# Patient Record
Sex: Male | Born: 1975 | Race: White | Hispanic: No | Smoking: Never smoker
Health system: Southern US, Community
[De-identification: ages and names within clinical notes are randomized; demographics above are authoritative.]

## PROBLEM LIST (undated history)

## (undated) DIAGNOSIS — K219 Gastro-esophageal reflux disease without esophagitis: Secondary | ICD-10-CM

## (undated) DIAGNOSIS — G629 Polyneuropathy, unspecified: Secondary | ICD-10-CM

## (undated) DIAGNOSIS — M545 Low back pain, unspecified: Secondary | ICD-10-CM

## (undated) DIAGNOSIS — M542 Cervicalgia: Secondary | ICD-10-CM

## (undated) DIAGNOSIS — IMO0002 Reserved for concepts with insufficient information to code with codable children: Secondary | ICD-10-CM

## (undated) DIAGNOSIS — M5414 Radiculopathy, thoracic region: Secondary | ICD-10-CM

## (undated) DIAGNOSIS — G894 Chronic pain syndrome: Secondary | ICD-10-CM

## (undated) DIAGNOSIS — M25569 Pain in unspecified knee: Secondary | ICD-10-CM

## (undated) DIAGNOSIS — Z8489 Family history of other specified conditions: Secondary | ICD-10-CM

## (undated) DIAGNOSIS — R351 Nocturia: Secondary | ICD-10-CM

## (undated) DIAGNOSIS — E119 Type 2 diabetes mellitus without complications: Secondary | ICD-10-CM

## (undated) DIAGNOSIS — T8859XA Other complications of anesthesia, initial encounter: Secondary | ICD-10-CM

## (undated) DIAGNOSIS — Z87442 Personal history of urinary calculi: Secondary | ICD-10-CM

## (undated) DIAGNOSIS — M7061 Trochanteric bursitis, right hip: Secondary | ICD-10-CM

## (undated) DIAGNOSIS — I10 Essential (primary) hypertension: Secondary | ICD-10-CM

## (undated) DIAGNOSIS — M47816 Spondylosis without myelopathy or radiculopathy, lumbar region: Secondary | ICD-10-CM

## (undated) DIAGNOSIS — K4020 Bilateral inguinal hernia, without obstruction or gangrene, not specified as recurrent: Secondary | ICD-10-CM

## (undated) DIAGNOSIS — M5410 Radiculopathy, site unspecified: Secondary | ICD-10-CM

## (undated) DIAGNOSIS — M961 Postlaminectomy syndrome, not elsewhere classified: Secondary | ICD-10-CM

## (undated) DIAGNOSIS — M5430 Sciatica, unspecified side: Secondary | ICD-10-CM

## (undated) DIAGNOSIS — M1710 Unilateral primary osteoarthritis, unspecified knee: Secondary | ICD-10-CM

## (undated) DIAGNOSIS — Z973 Presence of spectacles and contact lenses: Secondary | ICD-10-CM

## (undated) HISTORY — PX: ANTERIOR CERVICAL DECOMP/DISCECTOMY FUSION: SHX1161

## (undated) HISTORY — DX: Reserved for concepts with insufficient information to code with codable children: IMO0002

## (undated) HISTORY — DX: Chronic pain syndrome: G89.4

## (undated) HISTORY — DX: Unilateral primary osteoarthritis, unspecified knee: M17.10

## (undated) HISTORY — DX: Sciatica, unspecified side: M54.30

## (undated) HISTORY — DX: Low back pain, unspecified: M54.50

## (undated) HISTORY — DX: Low back pain: M54.5

## (undated) HISTORY — DX: Pain in unspecified knee: M25.569

## (undated) HISTORY — DX: Radiculopathy, thoracic region: M54.14

## (undated) HISTORY — PX: COLONOSCOPY: SHX174

## (undated) HISTORY — DX: Postlaminectomy syndrome, not elsewhere classified: M96.1

---

## 2000-05-04 ENCOUNTER — Emergency Department (HOSPITAL_COMMUNITY): Admission: EM | Admit: 2000-05-04 | Discharge: 2000-05-04 | Payer: Self-pay | Admitting: Emergency Medicine

## 2000-05-25 ENCOUNTER — Emergency Department (HOSPITAL_COMMUNITY): Admission: EM | Admit: 2000-05-25 | Discharge: 2000-05-25 | Payer: Self-pay | Admitting: Emergency Medicine

## 2000-05-26 ENCOUNTER — Encounter: Payer: Self-pay | Admitting: Emergency Medicine

## 2002-01-04 ENCOUNTER — Emergency Department (HOSPITAL_COMMUNITY): Admission: EM | Admit: 2002-01-04 | Discharge: 2002-01-04 | Payer: Self-pay | Admitting: Emergency Medicine

## 2002-01-04 ENCOUNTER — Encounter: Payer: Self-pay | Admitting: Emergency Medicine

## 2003-06-04 ENCOUNTER — Emergency Department (HOSPITAL_COMMUNITY): Admission: EM | Admit: 2003-06-04 | Discharge: 2003-06-04 | Payer: Self-pay | Admitting: Emergency Medicine

## 2004-01-16 ENCOUNTER — Emergency Department (HOSPITAL_COMMUNITY): Admission: AD | Admit: 2004-01-16 | Discharge: 2004-01-16 | Payer: Self-pay | Admitting: Family Medicine

## 2005-03-30 ENCOUNTER — Emergency Department (HOSPITAL_COMMUNITY): Admission: EM | Admit: 2005-03-30 | Discharge: 2005-03-30 | Payer: Self-pay | Admitting: Emergency Medicine

## 2007-10-21 ENCOUNTER — Emergency Department (HOSPITAL_COMMUNITY): Admission: EM | Admit: 2007-10-21 | Discharge: 2007-10-21 | Payer: Self-pay | Admitting: Emergency Medicine

## 2008-02-17 ENCOUNTER — Inpatient Hospital Stay (HOSPITAL_COMMUNITY): Admission: RE | Admit: 2008-02-17 | Discharge: 2008-02-19 | Payer: Self-pay | Admitting: Neurological Surgery

## 2008-02-27 ENCOUNTER — Encounter: Admission: RE | Admit: 2008-02-27 | Discharge: 2008-02-27 | Payer: Self-pay | Admitting: Neurological Surgery

## 2008-04-16 ENCOUNTER — Encounter: Admission: RE | Admit: 2008-04-16 | Discharge: 2008-04-16 | Payer: Self-pay | Admitting: Neurological Surgery

## 2008-12-25 ENCOUNTER — Encounter: Admission: RE | Admit: 2008-12-25 | Discharge: 2008-12-25 | Payer: Self-pay | Admitting: Neurological Surgery

## 2010-01-09 ENCOUNTER — Encounter
Admission: RE | Admit: 2010-01-09 | Discharge: 2010-02-28 | Payer: Self-pay | Source: Home / Self Care | Admitting: Physical Medicine & Rehabilitation

## 2010-01-13 ENCOUNTER — Ambulatory Visit: Payer: Self-pay | Admitting: Physical Medicine & Rehabilitation

## 2010-02-28 ENCOUNTER — Ambulatory Visit: Payer: Self-pay | Admitting: Physical Medicine & Rehabilitation

## 2010-03-19 ENCOUNTER — Encounter
Admission: RE | Admit: 2010-03-19 | Discharge: 2010-06-17 | Payer: Self-pay | Source: Home / Self Care | Admitting: Physical Medicine & Rehabilitation

## 2010-03-26 ENCOUNTER — Ambulatory Visit: Payer: Self-pay | Admitting: Physical Medicine & Rehabilitation

## 2010-04-21 ENCOUNTER — Ambulatory Visit: Payer: Self-pay | Admitting: Physical Medicine & Rehabilitation

## 2010-05-28 ENCOUNTER — Ambulatory Visit: Payer: Self-pay | Admitting: Physical Medicine & Rehabilitation

## 2010-06-18 ENCOUNTER — Encounter
Admission: RE | Admit: 2010-06-18 | Discharge: 2010-09-16 | Payer: Self-pay | Admitting: Physical Medicine & Rehabilitation

## 2010-06-25 ENCOUNTER — Ambulatory Visit: Payer: Self-pay | Admitting: Physical Medicine & Rehabilitation

## 2010-07-25 ENCOUNTER — Ambulatory Visit: Payer: Self-pay | Admitting: Physical Medicine & Rehabilitation

## 2010-08-27 ENCOUNTER — Ambulatory Visit: Payer: Self-pay | Admitting: Physical Medicine & Rehabilitation

## 2010-09-24 ENCOUNTER — Encounter
Admission: RE | Admit: 2010-09-24 | Discharge: 2010-10-29 | Payer: Self-pay | Source: Home / Self Care | Attending: Physical Medicine & Rehabilitation | Admitting: Physical Medicine & Rehabilitation

## 2010-10-01 ENCOUNTER — Ambulatory Visit: Payer: Self-pay | Admitting: Physical Medicine & Rehabilitation

## 2010-11-03 ENCOUNTER — Encounter
Admission: RE | Admit: 2010-11-03 | Discharge: 2010-11-04 | Payer: Self-pay | Source: Home / Self Care | Attending: Physical Medicine & Rehabilitation | Admitting: Physical Medicine & Rehabilitation

## 2010-11-04 ENCOUNTER — Ambulatory Visit
Admission: RE | Admit: 2010-11-04 | Discharge: 2010-11-04 | Payer: Self-pay | Source: Home / Self Care | Attending: Physical Medicine & Rehabilitation | Admitting: Physical Medicine & Rehabilitation

## 2010-11-23 ENCOUNTER — Encounter: Payer: Self-pay | Admitting: Neurological Surgery

## 2010-12-03 ENCOUNTER — Ambulatory Visit: Admit: 2010-12-03 | Payer: Self-pay | Admitting: Physical Medicine & Rehabilitation

## 2010-12-03 ENCOUNTER — Ambulatory Visit: Payer: BC Managed Care – PPO | Attending: Physical Medicine & Rehabilitation

## 2010-12-03 ENCOUNTER — Ambulatory Visit (HOSPITAL_BASED_OUTPATIENT_CLINIC_OR_DEPARTMENT_OTHER): Payer: BC Managed Care – PPO

## 2010-12-03 ENCOUNTER — Ambulatory Visit: Payer: Self-pay

## 2010-12-03 DIAGNOSIS — M961 Postlaminectomy syndrome, not elsewhere classified: Secondary | ICD-10-CM

## 2010-12-03 DIAGNOSIS — R209 Unspecified disturbances of skin sensation: Secondary | ICD-10-CM | POA: Insufficient documentation

## 2010-12-03 DIAGNOSIS — F3289 Other specified depressive episodes: Secondary | ICD-10-CM | POA: Insufficient documentation

## 2010-12-03 DIAGNOSIS — Z79899 Other long term (current) drug therapy: Secondary | ICD-10-CM | POA: Insufficient documentation

## 2010-12-03 DIAGNOSIS — M545 Low back pain: Secondary | ICD-10-CM

## 2010-12-03 DIAGNOSIS — IMO0002 Reserved for concepts with insufficient information to code with codable children: Secondary | ICD-10-CM

## 2010-12-03 DIAGNOSIS — F411 Generalized anxiety disorder: Secondary | ICD-10-CM | POA: Insufficient documentation

## 2010-12-03 DIAGNOSIS — F329 Major depressive disorder, single episode, unspecified: Secondary | ICD-10-CM | POA: Insufficient documentation

## 2010-12-03 DIAGNOSIS — G8928 Other chronic postprocedural pain: Secondary | ICD-10-CM | POA: Insufficient documentation

## 2010-12-24 ENCOUNTER — Ambulatory Visit (HOSPITAL_BASED_OUTPATIENT_CLINIC_OR_DEPARTMENT_OTHER): Payer: BC Managed Care – PPO

## 2010-12-24 ENCOUNTER — Encounter: Payer: BC Managed Care – PPO | Attending: Physical Medicine & Rehabilitation

## 2010-12-24 DIAGNOSIS — IMO0002 Reserved for concepts with insufficient information to code with codable children: Secondary | ICD-10-CM

## 2010-12-24 DIAGNOSIS — F341 Dysthymic disorder: Secondary | ICD-10-CM | POA: Insufficient documentation

## 2010-12-24 DIAGNOSIS — M961 Postlaminectomy syndrome, not elsewhere classified: Secondary | ICD-10-CM | POA: Insufficient documentation

## 2010-12-24 DIAGNOSIS — G8928 Other chronic postprocedural pain: Secondary | ICD-10-CM | POA: Insufficient documentation

## 2010-12-24 DIAGNOSIS — M545 Low back pain: Secondary | ICD-10-CM

## 2011-02-02 ENCOUNTER — Encounter: Payer: BC Managed Care – PPO | Attending: Physical Medicine & Rehabilitation

## 2011-02-02 ENCOUNTER — Ambulatory Visit (HOSPITAL_BASED_OUTPATIENT_CLINIC_OR_DEPARTMENT_OTHER): Payer: BC Managed Care – PPO | Admitting: Physical Medicine & Rehabilitation

## 2011-02-02 DIAGNOSIS — M5430 Sciatica, unspecified side: Secondary | ICD-10-CM

## 2011-02-02 DIAGNOSIS — M549 Dorsalgia, unspecified: Secondary | ICD-10-CM

## 2011-02-02 DIAGNOSIS — M961 Postlaminectomy syndrome, not elsewhere classified: Secondary | ICD-10-CM | POA: Insufficient documentation

## 2011-02-02 DIAGNOSIS — F341 Dysthymic disorder: Secondary | ICD-10-CM | POA: Insufficient documentation

## 2011-02-02 DIAGNOSIS — G8928 Other chronic postprocedural pain: Secondary | ICD-10-CM | POA: Insufficient documentation

## 2011-03-02 NOTE — Assessment & Plan Note (Signed)
HISTORY:  This is the patient that has been followed by Dr. Wynn Banker for some time for complaints of multiple pain locations including his back, shoulders, his buttocks, his low back and radiating down into his legs.  Today, his Oswestry score is 34.  The patient states his condition is unchanged.  His pain level stays around 6 or 7.  He is able to carry on his own activities of daily living.  He works 40 hours plus at [employer].  He does drive and he is able to go down steps.  He can walk for quite a distance without any real problems.  He does have some paresthesias and anxiety when the pain is increased.  Constitutionally, he is within normal limits.  Physical exam show his blood pressure to be 131/75, his pulse 94, respirations 18 and his O2 sats 97 on room air.  He can extend to about 5 degrees, flex to about 45 mL degrees without pain.  He is alert and oriented x3.  His affect is normal.  He is constitutionally again within normal limits.  Strength in lower extremities is 5/5 including iliopsoas, quadriceps, dorsiflexors and plantar flexors.  His sensation is intact to pinprick throughout the upper and lower extremities.  We will go ahead and refill his medicines today.  He will follow up with me in a month.  Prescriptions given for Opana ER 10 mg one p.o. b.i.d. 60 with no refill and oxycodone 5/325 one p.o. b.i.d. p.r.n. 60 with no refill.  His questions were encouraged and answered.  We will see him back in one month's time.     Philomena Buttermore L. Blima Dessert    RLW/MedQ D:  02/02/2011 15:02:18  T:  02/03/2011 07:07:10  Job #:  [id number]

## 2011-03-03 ENCOUNTER — Encounter: Payer: BC Managed Care – PPO | Attending: Neurosurgery | Admitting: Neurosurgery

## 2011-03-03 DIAGNOSIS — M545 Low back pain, unspecified: Secondary | ICD-10-CM | POA: Insufficient documentation

## 2011-03-03 DIAGNOSIS — M79609 Pain in unspecified limb: Secondary | ICD-10-CM | POA: Insufficient documentation

## 2011-03-04 NOTE — Assessment & Plan Note (Signed)
David [name] has followed here for low back pain with left radicular pain with some arm pain.  I saw him last month.  He was pretty much unchanged today.  He tells me he has no new complaints, but just multiple complaints at location throughout his back, shoulders, buttocks and back radiating down.  Today, his Oswestry score is 34 as it was last month.  He states his condition has unchanged.  His average pain is about 6 or 7.  The activity level is about 4-7.  Sleep is fair.  Pain is worse all the time.  All activities are aggravated at rest, heat, therapy, pacing, medication tend to help, mobility and walk 30-60 minutes without any difficulty.  He drives and climb steps.  He works 40 hours plus a week.  Review of systems shows is notable for high and low blood sugar sometimes.  He has spasms, depression, anxiety and tingling, but his extremities otherwise unchanged.  Social history, medical history and family history are all unchanged. He is married.  He lives with his wife and son.  Physical exam shows a blood pressure to be 145/93, pulse 87, respirations 18 and O2 sats 97% on room air.  There are no signs of aberrant behavior.  Motor strength is still 5/5 in the lower extremities including iliopsoas, quadriceps, dorsiflexors and plantar flexors. Sensation is intact.  Gait is normal.  Affect is bright and alert, orientation x3.  He is constitutionally within normal limits.  He did ask about a note to get him out of jury duties.  Says Dr. Wynn Banker has declined before.  I asked Dr. Wynn Banker about it and why he declined him again.  We did give his refill of his oxycodone 5/325 one p.o. b.i.d. 60 with no refill and his Opana ER 10 mg one p.o. b.i.d. 60 with no refills.  Questions were encouraged and answered.  We will see him back in a month.     David [name]    RLW/MedQ D:  03/03/2011 14:35:35  T:  03/04/2011 03:47:39  Job #:  [id number]

## 2011-03-17 NOTE — Op Note (Signed)
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   PROCEDURES:  1. Decompressive anterior cervical diskectomy C4-C5, C6-C7, and C7-T1.  2. Anterior cervical arthrodesis C4-C5, C6-C7, and C7-T1 utilizing 8-      mm corticocancellous allografts.  3. Anterior cervical plating C4-C5 utilizing a 25-mm [employer]ure      plate.  4. Anterior cervical plating C6 to T1 inclusive utilizing a 47-mm      Atlantis Venture plate.   ASSISTANT:  Donalee Citrin, M.D.   ANESTHESIA:  General endotracheal.   COMPLICATIONS:  None apparent.   INDICATIONS FOR PROCEDURE:  Mr. Cuevas is a 35 year old gentleman who  presented with severe left arm pain.  He had an MRI which showed severe  spondylosis with severe neural foraminal stenosis at C4-C5, C6-C7, C7-  T1, and T1-T2.  He tried medical management for quite some time without  significant relief.  I recommended an anterior approach to C4-C5, C6-C7,  and C7-T1.  If this did not cure all of his left arm pain, we would have  to do T1-T2 from a posterior approach, but I felt this offered him an  excellent opportunity to improve his pain syndrome.  He understood the  risks, benefits, and expected outcome and wished to proceed.   DESCRIPTION OF THE PROCEDURE:  The patient was taken to the operating  room, and after induction of adequate general endotracheal anesthesia,  he was placed in the supine position.  His  left anterior cervical region  was prepped with DuraPrep and then draped in the usual sterile fashion.  We marked out for 2 incisions, one for the C4-C5 disk and one for the C6-  C7 and C7-T1 anterior cervical diskectomies.  We started at the lower  incision first and made a transverse incision to the left of midline  just over the clavicle, caved this down through the platysma which was  elevated, opened, and undermined with Metzenbaum scissors.  I then  dissected a plane medial to the sternocleidomastoid muscle and internal  carotid artery and lateral to the trachea and esophagus to expose C6-C7  at C7-T1.  Intraoperative fluoroscopy confirmed my level and then I took  the longus colli muscles to expose C6-C7 and C7-T1.  We placed Shadow-  Line retractors under this.  We incised the disk space anteriorly,  performed the initial diskectomy with pituitary rongeurs and curved  Karlin curettes.  We then used the high-speed drill to drill the  endplates to prepare for later arthrodesis.  We drilled down to the  level of the posterior longitudinal ligament which was opened with a  nerve hook, first the C7-T1 bodies.  We marched along the superior  endplate of T1 until we found the pedicle bilaterally and then marched  along the C8 nerve root to decompress it.  There was a large spur disk  complex to the left come off the inferior endplate of C7.  This was  removed by drilling more of the vertebral body over the foramen on the  left side and then undercutting this.  The C8 nerve was identified and  well decompressed distally into the foramen until we were distal to the  pedicle.  We then performed the exact same decompression at C6-C7 after  palpating with a black nerve hook into the foramen and into the midline  C7-T1 to assure adequate decompression.  We drilled the endplates of C6-  C7, drilled down to the level of the posterior longitudinal ligament.  We opened this with a nerve hook and  then removed it undercutting the  bodies of C6 and C7.  We marched along the C7 endplate to identify the  pedicles and marched along the pedicles to identify the C7 nerve roots,  decompressed these distally into the foramen until we were distal to the  pedicle.  We then palpated with a nerve hook to assure adequate  decompression of surgical canal and of the foramina.  We then measured  the interspaces to be 8 mm.  We used 8-mm corticocancellous allograft  and tapped these into position at C6-C7 at C7-T1.  We then used 47-mm  Venture plate to place two 13-mm variable angle screws in the bodies of  C6, C7, and T1, and these were locked in the plate by the locking  mechanism within the plate.  We then dried all bleeding points and the  internal carotid artery and lateral to the trachea and esophagus to  expose C4-5 and intraoperative fluoroscopy confirmed the level at C4-5  and then the longus colli muscles were taken down.  The Shadow-Line  retractors were placed under this.  The annulus was incised at C4-C5 and  the initial diskectomy was done with pituitary rongeurs and curved  curettes.  We then used the high-speed drill to drill the endplates to  prepare for later arthrodesis.  We drilled down to the level of the  posterior longitudinal ligament which was opened and removed in a  circumferential fashion with undercutting bodies of C4-C5.  There was a  large osteophyte coming off the inferior endplate of C4 which was  removed by undercutting the vertebral body as best we could.  Bilateral  foraminotomies were performed.  The C5 nerves were divided and well  decompressed distally into the foramina.  We then palpated with a nerve  hook to assure adequate decompression and measured the interspace to be  8 mm.  We used another 8-mm corticocancellous allograft and tapped this  into position at C4-C5.  We then used a 25-mm Atl[employer] Venture plate and  place two 13-mm variable angle screws in  the bodies of C4 and C5 and  then these were locked in the plate by locking mechanism within the  plate.  We then irrigated with saline solution containing bacitracin,  dried all bleeding points at both incisions, placed a 7 flat JP drain  through a separate stab incision, and then closed each incision with 3-0  Vicryl in the platysma and 3-0 Vicryl in the subcuticular tissues.  The  incisions  were then closed with Benzoin and Steri-Strips.  A sterile  dressing was applied.  The patient was awakened from general anesthesia  and transferred to recovery room in stable condition.  At the end of the  procedure, all sponge, needle, and instrument counts were correct.      Tia Alert, MD  Electronically Signed     DSJ/MEDQ  D:  02/15/2008  T:  02/16/2008  Job:  [id number]

## 2011-03-17 NOTE — Discharge Summary (Signed)
NAME:  David [name], David [name] NO.:  [id number]   MEDICAL RECORD NO.:  [id number]          PATIENT TYPE:  INP   LOCATION:  3037                         FACILITY:  [facility]   PHYSICIAN:  Tia Alert, MD     DATE OF BIRTH:  [birth year]   DATE OF ADMISSION:  02/15/2008  DATE OF DISCHARGE:  02/19/2008                               DISCHARGE SUMMARY   ADMITTING DIAGNOSES:  Cervical spondylosis versus cervical spinal  stenosis and cervical disk herniation C4-C5, C6-C7, and C7-T1 with neck  and left arm pain.   PROCEDURE:  Anterior cervical diskectomy fusion plating at C4-C5, C6-C7,  and C7-T1.   SECONDARY DIAGNOSIS:  Urinary retention.   BRIEF HISTORY OF PRESENT ILLNESS:  [name] is a 35 year old gentleman  who presented with severe neck pain with left arm pain with numbness and  tingling in the left arm.  He had an MRI, which showed severe cervical  spondylosis with cervical disk herniations at C4-C5 to the left, C6-C7,  and C7-T1 to the left and T1-T2 on the left.  We recommended surgical  decompression after failed medical management, we recommended anterior  cervical discectomy fusion with plating at C4-C5 and then C6-C7, and C7-  T1 through separate stab incisions.  We did not feel that we could  approach the T1-T2 level from an anterior approach without opening the  sternum of the manubrium and the patient did not wish to do this.  Therefore, we chose to pursue only the upper 3 levels.  The patient  understood the risks, benefits, unexpected outcome and wished to proceed  with this procedure.   HOSPITAL COURSE:  The patient was admitted on February 15, 2008, and taken  to the operating room where he underwent anterior cervical discectomy  fusion with plating at C4-C5, C6-C7, and C7-T1.  The patient tolerated  the procedure well and he was taken to recovery room and then to the  floor in stable condition.  For details of the operative procedure,  please see the dictated  operative note.  The patient's hospital course  was prolonged by postoperative urinary retention requiring in and out  catheterization followed by Foley catheter placement for about 36 hours.  His pain was well controlled on oral pain medications, though he did  complain of appropriate neck soreness.  His radicular pain was gone.  He  denied any numbness, tingling, or weakness in his arms or hands, or any  change in his gait.  He was ambulating in the halls without difficulty.  On February 18, 2008, his Foley catheter was discontinued at 6 a.m., and he  voided multiple times through the day with 0 mL postvoid residuals.  Therefore, he was discharged to home on February 19, 2008, with plans of  following with me in 2 weeks in the office.   DISCHARGE MEDICATIONS:  1. Dilaudid 2 mg p.o. q.6 h p.r.n. pain, 60 pills and no refills.  2. Valium 5 mg p.o. t.i.d. p.r.n. spasms, 60 pills and no refill.   He understands the use of these medications, the potential side  effects.  All of his discharge questions were answered to his satisfaction.  He  demonstrated understanding of all discharge instructions.  He is to call  for any unusual redness, tenderness, swelling over his wound or any  temperature above 101.5.  His followup appointment is in 2 weeks with  Dr. Yetta Barre.   FINAL DIAGNOSES:  1. Anterior cervical discectomy with fusion plating.  2. Postoperative urinary retention.      Tia Alert, MD  Electronically Signed     DSJ/MEDQ  D:  02/19/2008  T:  02/20/2008  Job:  [id number]

## 2011-04-01 ENCOUNTER — Encounter (HOSPITAL_BASED_OUTPATIENT_CLINIC_OR_DEPARTMENT_OTHER): Payer: BC Managed Care – PPO | Admitting: Neurosurgery

## 2011-04-01 DIAGNOSIS — M1710 Unilateral primary osteoarthritis, unspecified knee: Secondary | ICD-10-CM

## 2011-04-01 DIAGNOSIS — M5430 Sciatica, unspecified side: Secondary | ICD-10-CM

## 2011-04-02 NOTE — Assessment & Plan Note (Signed)
[name] follows up here today for his low back pain, radicular pain with some left upper extremity pain as well as back pain.  He states this is unchanged.  He rates it about 5.  It is sharp burning pain and is intermittent for the most part but he has constant pain that fluctuates.  His activity level is rated about 4-7, pain is same 24 hours a day. Sleep patterns are fair.  All activities aggravate his pain.  Rest, heat, pacing activities and medication tend to help.  Functionality, he works 40 hours a week at [employer].  REVIEW OF SYSTEMS:  Notable for those difficulties stated above, otherwise within normal limits.  The patient can walk for about 60 minutes, climb stairs and walks without any difficulties.  Gait appears to be within normal limits.  PAST MEDICAL HISTORY:  Unchanged.  SOCIAL HISTORY:  Married, lives with his wife and child.  FAMILY HISTORY:  Unchanged.  PHYSICAL EXAMINATION:  VITAL SIGNS:  His blood pressure is 147/84, pulse 85, respirations 20, O2 sats 99 on room air. NEURO:  His motor strength is good.  Sensation is good in the lower extremities.  He is alert and oriented x3.  His affect is bright. Constitutionally within normal limits.  ASSESSMENT: 1. Chronic pain syndrome. 2. Low back pain with radiculopathy 3. Bilateral knee pain.  PLAN: 1. The patient states due to finances, he may have to change pain     clinics and I am going to schedule him with the nurse next month to     try to help with that some. 2. I will go ahead and refill his oxycodone 5/325 one p.o. b.i.d. 60     with no refill. 3. Opana ER 10 mg 1 p.o. b.i.d. 60 with no refill. 4. Hopefully he will be able to follow up with [place] in 1 month.  If he     does decide to change clinics, he will notify us.     Victorhugo Preis L. Blima Dessert Electronically Signed    RLW/MedQ D:  04/01/2011 14:18:31  T:  04/02/2011 04:21:48  Job #:  [id number]

## 2011-04-29 ENCOUNTER — Encounter: Payer: BC Managed Care – PPO | Attending: Neurosurgery | Admitting: Neurosurgery

## 2011-04-29 DIAGNOSIS — M545 Low back pain, unspecified: Secondary | ICD-10-CM | POA: Insufficient documentation

## 2011-04-29 DIAGNOSIS — M5137 Other intervertebral disc degeneration, lumbosacral region: Secondary | ICD-10-CM

## 2011-04-29 DIAGNOSIS — M79609 Pain in unspecified limb: Secondary | ICD-10-CM | POA: Insufficient documentation

## 2011-04-29 DIAGNOSIS — M5430 Sciatica, unspecified side: Secondary | ICD-10-CM

## 2011-04-29 DIAGNOSIS — M25569 Pain in unspecified knee: Secondary | ICD-10-CM

## 2011-04-29 DIAGNOSIS — G894 Chronic pain syndrome: Secondary | ICD-10-CM

## 2011-04-30 NOTE — Assessment & Plan Note (Signed)
Mr. Koury follows up today for his low back pain, radicular pain, with some occasional left upper extremity problems.  He states his pain is unchanged.  He rates it currently at 6-8, it is intermittent with paresthesia-like problems as well as some sharp burning pain.  His activity level is about 6 or 7 pain same 24 hours a day, sleep patterns are fair.  All activities aggravate his pain.  Heat, pacing activities, rest, and medications tend to help.  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  Review of systems is notable for those difficulties described above as well as some depression, anxiety.  No suicidal thoughts.  No aberrant behaviors.  Past medical history, social history and family history all unchanged.  Physical exam; his blood pressure is 126/71, pulse 96, respirations 10, O2 sats 98 on room air.  His motor strength is 5/5.  Sensation is intact.  Constitutionally, he is within normal limits.  He is alert and oriented x3.  He appears somewhat depressed but his gait is normal.  ASSESSMENT: 1. Chronic pain syndrome. 2. Low back pain with radiculopathy. 3. Bilateral knee pain, it is intermittent.  PLAN: 1. I will go ahead and refill his oxycodone 5/325 one p.o. b.i.d. 60     with no refill. 2. Opana ER 10 mg 1 p.o. b.i.d. 60 with no refill. 3. He will return to the clinic in 1 month.  His questions were     encouraged and answered.     Shadd Dunstan L. Blima Dessert Electronically Signed    RLW/MedQ D:  04/29/2011 14:07:08  T:  04/30/2011 03:31:47  Job #:  [id number]

## 2011-06-03 ENCOUNTER — Encounter: Payer: BC Managed Care – PPO | Attending: Neurosurgery | Admitting: Neurosurgery

## 2011-06-03 DIAGNOSIS — G894 Chronic pain syndrome: Secondary | ICD-10-CM

## 2011-06-03 DIAGNOSIS — M545 Low back pain, unspecified: Secondary | ICD-10-CM | POA: Insufficient documentation

## 2011-06-03 DIAGNOSIS — M1710 Unilateral primary osteoarthritis, unspecified knee: Secondary | ICD-10-CM

## 2011-06-03 DIAGNOSIS — M79609 Pain in unspecified limb: Secondary | ICD-10-CM | POA: Insufficient documentation

## 2011-06-04 NOTE — Assessment & Plan Note (Signed)
HISTORY OF PRESENT ILLNESS:  David [name] follows up today for his continued upper and lower extremity pain as well as back pain.  He reports no change in his radicular symptoms and rates his pain about 5- 7, it is intermittent dull stabbing and sharp pain with constant tingling.  General activity level is 5-8.  Pain is same 24 hours a day. Sleep patterns are fair.  All activities aggravate.  Rest, heat, medication, and pacing tends to help.  He walks without assistance.  He can walk up to an hour.  He climb steps and drives.  He works 40 hours plus a week.  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  REVIEW OF SYSTEMS:  Notable for those difficulties described above as well as some spasms and depression, anxiety.  No suicidal thoughts or aberrant behaviors.  His Oswestry score is 40.  PAST MEDICAL HISTORY:  Unchanged.  SOCIAL HISTORY:  He is married, lives with his wife and son.  FAMILY HISTORY:  Unchanged.  PHYSICAL EXAMINATION:  VITAL SIGNS:  His blood pressure is 111/66, pulse 96, respirations 16, and O2 sats 98 on room air. NEUROLOGIC:  His motor strength is 5/5 in the upper and lower extremities.  His sensation is intact.  Constitutionally, he is within normal limits.  His affect is bright and alert.  He is oriented x3.  He has got a normal gait.  ASSESSMENT: 1. Chronic pain syndrome. 2. Low back pain with radiculopathy. 3. History of knee pain. 4. History of cervical laminectomy with fusion pain syndrome.  PLAN: 1. Refill Opana ER 10 mg 1 p.o. b.i.d. #60 with no refill. 2. Oxycodone 5/325 one p.o. b.i.d. #60 with no refill. 3. He will follow up in my clinic in 1 month.  His questions were     encouraged and answered.     Rosemary Mossbarger L. Blima Dessert Electronically Signed    RLW/MedQ D:  06/03/2011 14:44:03  T:  06/04/2011 00:27:29  Job #:  [id number]

## 2011-07-03 ENCOUNTER — Encounter: Payer: BC Managed Care – PPO | Attending: Neurosurgery | Admitting: Neurosurgery

## 2011-07-03 DIAGNOSIS — M25569 Pain in unspecified knee: Secondary | ICD-10-CM | POA: Insufficient documentation

## 2011-07-03 DIAGNOSIS — G894 Chronic pain syndrome: Secondary | ICD-10-CM | POA: Insufficient documentation

## 2011-07-03 DIAGNOSIS — IMO0002 Reserved for concepts with insufficient information to code with codable children: Secondary | ICD-10-CM | POA: Insufficient documentation

## 2011-07-03 DIAGNOSIS — M545 Low back pain, unspecified: Secondary | ICD-10-CM | POA: Insufficient documentation

## 2011-07-03 DIAGNOSIS — M961 Postlaminectomy syndrome, not elsewhere classified: Secondary | ICD-10-CM

## 2011-07-03 DIAGNOSIS — M5430 Sciatica, unspecified side: Secondary | ICD-10-CM

## 2011-07-03 NOTE — Assessment & Plan Note (Signed)
HISTORY OF PRESENT ILLNESS:  This is a patient who is seen by Dr. Wynn Banker for back and neck pain that radiates down to his legs.  He reports no change in his pain but he does state that he feels like his back pain has been worse but he has been working at least 60 hours a week for last 2 months and he has taken a short sabbatical for few days to try to rest.  He blames his pain elevation on that.  He has average pain that is 6-8.  It is a sharp, burning, stabbing, tingling, aching pain that is constant.  Pain is same 24 hours a day.  His activity level is 7-9.  All activities aggravate.  Rest, heat, therapy, and medication tend to help.  He drives.  He will climb steps.  He can walk about 60 minutes at a time.  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  REVIEW OF SYSTEMS:  Notable for those difficulties as well as some numbness, depression, anxiety.  No suicidal thoughts or aberrant behaviors.  His Oswestry score is 44.  PAST MEDICAL HISTORY:  Unchanged.  SOCIAL HISTORY:  He is married, lives with his wife.  FAMILY HISTORY:  Unchanged.  PHYSICAL EXAMINATION:  VITAL SIGNS:  His blood pressure 126/64, pulse 97, respirations 18, and O2 sats 96 on room air. NEUROLOGIC:  His motor strength to confrontational testing is 5/5 in the upper and lower extremities.  Sensation is intact.  Constitutionally, he is mildly obese.  He is alert and oriented x3.  His affect is bright. His gait is normal.  ASSESSMENT: 1. Chronic pain syndrome postop. 2. Low back pain with radiculopathy. 3. History of knee pain. 4. History of cervical laminectomy with post-laminectomy syndrome.  PLAN:  Refill Opana ER 10 mg 1 p.o. b.i.d. 60 with no refill and oxycodone 5/325 one p.o. b.i.d. 60 with no refill.  He will follow up in the clinic in 1 month.  His questions were encouraged and answered.     David [name] L. Blima Dessert Electronically Signed    RLW/MedQ D:  07/03/2011  14:35:25  T:  07/03/2011 21:56:27  Job #:  [id number]

## 2011-07-28 LAB — BASIC METABOLIC PANEL
CO2: 25
Chloride: 96
GFR calc Af Amer: >60
Potassium: 3.2 — ABNORMAL LOW
Sodium: 132 — ABNORMAL LOW

## 2011-07-28 LAB — CBC
Hemoglobin: 15.4
MCHC: 35.1
MCV: 94.8
RBC: 4.63

## 2011-07-28 LAB — URINALYSIS, ROUTINE W REFLEX MICROSCOPIC
Bilirubin Urine: NEGATIVE
Hgb urine dipstick: NEGATIVE
Ketones, ur: 15 — AB
Protein, ur: NEGATIVE
Urobilinogen, UA: 0.2

## 2011-07-28 LAB — APTT: aPTT: 32

## 2011-07-28 LAB — DIFFERENTIAL
Basophils Relative: 0
Eosinophils Absolute: 0.2
Monocytes Absolute: 0.4
Monocytes Relative: 5
Neutro Abs: 4.7

## 2011-07-28 LAB — URINE CULTURE
Colony Count: NO GROWTH
Culture: NO GROWTH

## 2011-07-30 ENCOUNTER — Encounter: Payer: BC Managed Care – PPO | Admitting: Neurosurgery

## 2011-07-31 ENCOUNTER — Ambulatory Visit: Payer: BC Managed Care – PPO | Admitting: Neurosurgery

## 2011-08-07 LAB — CBC
HCT: 47.3
Hemoglobin: 16.5
MCHC: 34.8
MCV: 95.3
RBC: 4.97

## 2011-08-07 LAB — INFLUENZA A AND B ANTIGEN (CONVERTED LAB)

## 2011-08-07 LAB — DIFFERENTIAL
Basophils Relative: 1
Eosinophils Absolute: 0.2
Eosinophils Relative: 1
Monocytes Absolute: 0.8
Monocytes Relative: 5

## 2011-08-27 ENCOUNTER — Encounter: Payer: BC Managed Care – PPO | Attending: Neurosurgery | Admitting: Neurosurgery

## 2011-08-27 DIAGNOSIS — R209 Unspecified disturbances of skin sensation: Secondary | ICD-10-CM | POA: Insufficient documentation

## 2011-08-27 DIAGNOSIS — G894 Chronic pain syndrome: Secondary | ICD-10-CM | POA: Insufficient documentation

## 2011-08-27 DIAGNOSIS — M961 Postlaminectomy syndrome, not elsewhere classified: Secondary | ICD-10-CM | POA: Insufficient documentation

## 2011-08-27 DIAGNOSIS — M549 Dorsalgia, unspecified: Secondary | ICD-10-CM | POA: Insufficient documentation

## 2011-08-27 DIAGNOSIS — M62838 Other muscle spasm: Secondary | ICD-10-CM | POA: Insufficient documentation

## 2011-08-27 DIAGNOSIS — M542 Cervicalgia: Secondary | ICD-10-CM | POA: Insufficient documentation

## 2011-08-27 DIAGNOSIS — M5430 Sciatica, unspecified side: Secondary | ICD-10-CM

## 2011-08-27 DIAGNOSIS — M545 Low back pain: Secondary | ICD-10-CM

## 2011-08-27 DIAGNOSIS — IMO0002 Reserved for concepts with insufficient information to code with codable children: Secondary | ICD-10-CM | POA: Insufficient documentation

## 2011-08-27 NOTE — Assessment & Plan Note (Signed)
This is a patient of Dr. Wynn Banker, seen for back and neck pain.  He has got a postlaminectomy syndrome as well as a history of cervical laminectomy.  Rates his average pain a 5 to a 7.  It is a sharp, burning- to-dull stabbing pain, aching pain that is constant.  General activity level is a 6 to an 8.  Pain is same 24 hours a day.  Sleep patterns are fair.  All activities aggravate.  Rest, heat, therapy, and medication tend to help.  He walks independently, climbs steps and drives.  He can walk up to an hour at a time.  He is employed 50 hours a week plus at [employer].  REVIEW OF SYSTEMS:  Notable for difficulties described above as well as some paresthesias, spasm, depression, anxiety.  No suicidal thoughts or aberrant behaviors.  Last pill count and UDS consistent.  PAST MEDICAL HISTORY:  Unchanged.  SOCIAL HISTORY:  Unchanged.  FAMILY HISTORY:  Unchanged.  PHYSICAL EXAMINATION:  VITAL SIGNS:  His blood pressure 127/61, pulse 94, respirations 16, O2 sats 97 on room air. NEUROLOGIC:  His motor strength and sensation are intact. CONSTITUTIONAL:  He is within normal limits.  He is alert and oriented x3.  He has a normal gait.  ASSESSMENT: 1. Chronic pain syndrome postoperatively. 2. Low back pain with radiculopathy, post laminectomy syndrome. 3. Post cervical laminectomy syndrome.  PLAN: 1. Refill oxycodone 5/325 one p.o. b.i.d., 60 with no refill. 2. Opana ER 10 mg 1 p.o. b.i.d., 60 with no refills.  Questions were     encouraged and answered.  I will see him in a month.     David [name] L. Blima Dessert Electronically Signed    RLW/MedQ D:  08/27/2011 14:02:42  T:  08/27/2011 23:25:16  Job #:  [id number]

## 2011-09-23 ENCOUNTER — Ambulatory Visit: Payer: BC Managed Care – PPO | Admitting: Neurosurgery

## 2011-09-30 ENCOUNTER — Encounter: Payer: BC Managed Care – PPO | Attending: Neurosurgery | Admitting: Neurosurgery

## 2011-09-30 DIAGNOSIS — E669 Obesity, unspecified: Secondary | ICD-10-CM | POA: Insufficient documentation

## 2011-09-30 DIAGNOSIS — M545 Low back pain, unspecified: Secondary | ICD-10-CM | POA: Insufficient documentation

## 2011-09-30 DIAGNOSIS — G8928 Other chronic postprocedural pain: Secondary | ICD-10-CM

## 2011-09-30 DIAGNOSIS — M961 Postlaminectomy syndrome, not elsewhere classified: Secondary | ICD-10-CM | POA: Insufficient documentation

## 2011-09-30 DIAGNOSIS — IMO0002 Reserved for concepts with insufficient information to code with codable children: Secondary | ICD-10-CM | POA: Insufficient documentation

## 2011-09-30 NOTE — Assessment & Plan Note (Signed)
Patient of Dr. Wynn Banker seen for back and neck pain, post cervical laminectomy pain, as well as low back pain, post-surgical pain.  He rates the pain as sharp, burning, dull, stabbing, tingling, aching, averages 6-7.  General activity level is 5 to an 8.  Pain is same 24 hours a day.  Sleep patterns are fair.  All activities aggravate.  Rest, heat, therapy, and medication tend to help.  He walks without assistance.  He can climb steps and drive and can walk up to an hour at a time.  He works 50 hours plus.  REVIEW OF SYSTEMS:  Notable for difficulties described above as well as some numbness, tingling, spasms, depression, anxiety.  No suicidal thoughts or aberrant behaviors.  Last UDS and pill count is correct.  He will be getting new UDS today.  PAST MEDICAL HISTORY:  Unchanged.  SOCIAL HISTORY:  Unchanged.  FAMILY HISTORY:  Unchanged.  PHYSICAL EXAMINATION:  His blood pressure is 137/78, pulse 100, respiratory rate 16, O2 sats 99% on room air.  His motor strength and sensation intact.  Constitutionally he is mildly obese.  He is alert and oriented x3.  Has a normal gait.  ASSESSMENT: 1. Chronic pain postoperatively. 2. Low back pain with radiculopathy and postlaminectomy syndrome. 3. Post cervical laminectomy syndrome.  PLAN: 1. Refill oxycodone 5/325 one p.o. b.i.d., 60 with no refill. 2. Opana ER 10 mg 1 p.o. b.i.d., 60 with no refills.  Questions were encouraged and answered.  I will see him back here in 1 month.     Lanecia Sliva L. Blima Dessert Electronically Signed    RLW/MedQ D:  09/30/2011 14:11:50  T:  09/30/2011 23:50:15  Job #:  [id number]

## 2011-10-29 ENCOUNTER — Encounter: Payer: BC Managed Care – PPO | Attending: Neurosurgery | Admitting: Neurosurgery

## 2011-10-29 DIAGNOSIS — M542 Cervicalgia: Secondary | ICD-10-CM

## 2011-10-29 DIAGNOSIS — M961 Postlaminectomy syndrome, not elsewhere classified: Secondary | ICD-10-CM | POA: Insufficient documentation

## 2011-10-29 DIAGNOSIS — M545 Low back pain, unspecified: Secondary | ICD-10-CM | POA: Insufficient documentation

## 2011-10-29 DIAGNOSIS — G8928 Other chronic postprocedural pain: Secondary | ICD-10-CM

## 2011-10-30 NOTE — Assessment & Plan Note (Signed)
This is a patient of Dr. Wynn Banker seen for low back pain, post cervical and lumbar laminectomy syndrome.  He rates his average pain at a 5 or 6. It is a sharp, burning, dull, stabbing, tingling and aching pain. General activity level 6 to an 8.  Pain is worse during the morning and in the evening.  Sleep patterns are fair.  All activities aggravate. Rest, heat, therapy and medication helps.  He walks without assistance. He climb steps and drive.  He can walk about an hour at a time.  He works 50 hours plus.  REVIEW OF SYSTEMS:  Notable for the difficulties described above as well as some spasm, depression, anxiety and no suicidal thoughts or aberrant behaviors.  Pill count and UDS consistent.  PAST MEDICAL HISTORY, SOCIAL HISTORY AND FAMILY HISTORY:  Unchanged.  PHYSICAL EXAMINATION:  VITAL SIGNS:  His blood pressure is 135/68, pulse 93, respirations 16 and O2 sats 97 on room air.  EXTREMITIES:  His motor strength and sensation are intact. NEUROLOGIC:  Constitutionally, he is within normal limits.  He is alert and oriented x3.  Has a slight limp.  ASSESSMENT: 1. Chronic postoperative pain. 2. Chronic low back pain with post laminectomy syndrome. 3. Post cervical laminectomy syndrome.  PLAN: 1. Refill oxycodone 5/325, 1 p.o. b.i.d. p.r.n. 60 with no refill. 2. Opana ER 10 mg 1 p.o. b.i.d., 60 with no refills.  His questions     were encouraged and answered.  I will see him in a month.     Briseida Gittings L. Blima Dessert Electronically Signed    RLW/MedQ D:  10/29/2011 14:01:01  T:  10/30/2011 06:38:47  Job #:  [id number]

## 2011-12-02 ENCOUNTER — Encounter: Payer: BC Managed Care – PPO | Attending: Neurosurgery | Admitting: Neurosurgery

## 2011-12-02 DIAGNOSIS — M5430 Sciatica, unspecified side: Secondary | ICD-10-CM

## 2011-12-02 DIAGNOSIS — G8929 Other chronic pain: Secondary | ICD-10-CM | POA: Insufficient documentation

## 2011-12-02 DIAGNOSIS — M961 Postlaminectomy syndrome, not elsewhere classified: Secondary | ICD-10-CM | POA: Insufficient documentation

## 2011-12-02 DIAGNOSIS — G8928 Other chronic postprocedural pain: Secondary | ICD-10-CM | POA: Insufficient documentation

## 2011-12-02 DIAGNOSIS — M545 Low back pain, unspecified: Secondary | ICD-10-CM | POA: Insufficient documentation

## 2011-12-03 NOTE — Assessment & Plan Note (Signed)
This is a patient of Dr. Wynn Banker seen for back and chronic pain.  He reports his pain at 5-6 intermittent, sharp and burning pain.  General activity level is 5-7.  The pain is same 24 hours a day.  Sleep patterns are fair.  All activities aggravate.  Rest, heat, therapy, pacing and medication help.  He walks without assistance up to an hour at a time. He climb steps and drive.  Functionally, he works 50 hours plus a week.  REVIEW OF SYSTEMS:  Notable for difficulties described above as well as some paresthesias, depression, anxiety.  No suicidal thoughts or aberrant behaviors.  Last pill count and UDS consistent.  PAST MEDICAL HISTORY, SOCIAL HISTORY AND FAMILY HISTORY:  Unchanged.  PHYSICAL EXAMINATION:  VITAL SIGNS:  His blood pressure is 120/62, pulse 86, respirations 14 and O2 sats 98 on room air.  EXTREMITIES:  His motor strength and sensation is intact. NEUROLOGIC:  Constitutionally, he is obese.  He is alert and oriented x3.  He has a normal gait.  ASSESSMENT: 1. Chronic postoperative pain. 2. Chronic low back pain and post laminectomy syndrome 3. Post cervical laminectomy syndrome.  PLAN: 1. Refill oxycodone 5/325, 1 p.o. b.i.d., 60 with no refills 2. Opana ER 10 mg 1 p.o. b.i.d., 60 with no refills.  His questions     were encouraged and answered.  He will follow up in a month.     Melanye Hiraldo L. Blima Dessert Electronically Signed    RLW/MedQ D:  12/02/2011 14:10:48  T:  12/03/2011 02:24:50  Job #:  [id number]

## 2011-12-30 ENCOUNTER — Encounter: Payer: BC Managed Care – PPO | Attending: Physical Medicine & Rehabilitation | Admitting: *Deleted

## 2011-12-30 ENCOUNTER — Encounter: Payer: Self-pay | Admitting: *Deleted

## 2011-12-30 VITALS — BP 126/67 | HR 97 | Resp 18 | Ht 73.0 in | Wt 257.0 lb

## 2011-12-30 DIAGNOSIS — M549 Dorsalgia, unspecified: Secondary | ICD-10-CM | POA: Insufficient documentation

## 2011-12-30 DIAGNOSIS — G8928 Other chronic postprocedural pain: Secondary | ICD-10-CM | POA: Insufficient documentation

## 2011-12-30 DIAGNOSIS — G894 Chronic pain syndrome: Secondary | ICD-10-CM

## 2011-12-30 DIAGNOSIS — M545 Low back pain, unspecified: Secondary | ICD-10-CM | POA: Insufficient documentation

## 2011-12-30 DIAGNOSIS — IMO0002 Reserved for concepts with insufficient information to code with codable children: Secondary | ICD-10-CM

## 2011-12-30 DIAGNOSIS — M961 Postlaminectomy syndrome, not elsewhere classified: Secondary | ICD-10-CM | POA: Insufficient documentation

## 2011-12-30 MED ORDER — OXYMORPHONE HCL ER 10 MG PO TB12: 10.0000 mg | ORAL_TABLET | Freq: Two times a day (BID) | ORAL | Status: DC

## 2011-12-30 MED ORDER — OXYCODONE-ACETAMINOPHEN 5-325 MG PO TABS: 1.0000 | ORAL_TABLET | Freq: Two times a day (BID) | ORAL | Status: DC

## 2011-12-30 NOTE — Progress Notes (Signed)
Reports increased discomfort today; states it is always variable. Current weather changes have adverse effect. No questions voiced

## 2012-01-26 ENCOUNTER — Encounter: Payer: BC Managed Care – PPO | Attending: Neurosurgery

## 2012-01-26 ENCOUNTER — Encounter: Payer: Self-pay | Admitting: Physical Medicine & Rehabilitation

## 2012-01-26 ENCOUNTER — Ambulatory Visit (HOSPITAL_BASED_OUTPATIENT_CLINIC_OR_DEPARTMENT_OTHER): Payer: BC Managed Care – PPO | Admitting: Physical Medicine & Rehabilitation

## 2012-01-26 VITALS — BP 127/66 | HR 94 | Resp 16 | Ht 74.0 in | Wt 255.0 lb

## 2012-01-26 DIAGNOSIS — G8929 Other chronic pain: Secondary | ICD-10-CM | POA: Insufficient documentation

## 2012-01-26 DIAGNOSIS — M545 Low back pain, unspecified: Secondary | ICD-10-CM | POA: Insufficient documentation

## 2012-01-26 DIAGNOSIS — G8928 Other chronic postprocedural pain: Secondary | ICD-10-CM | POA: Insufficient documentation

## 2012-01-26 DIAGNOSIS — M961 Postlaminectomy syndrome, not elsewhere classified: Secondary | ICD-10-CM | POA: Insufficient documentation

## 2012-01-26 MED ORDER — OXYCODONE-ACETAMINOPHEN 5-325 MG PO TABS: 1.0000 | ORAL_TABLET | Freq: Two times a day (BID) | ORAL | Status: DC

## 2012-01-26 MED ORDER — OXYMORPHONE HCL ER 10 MG PO TB12: 10.0000 mg | ORAL_TABLET | Freq: Two times a day (BID) | ORAL | Status: DC

## 2012-01-26 NOTE — Progress Notes (Signed)
  Subjective:    Patient ID: David [name], male    DOB: 06/09/1976, 36 y.o.   MRN: [id number]  HPI  Neck pain remains fairly constant. Hip and back pain fluctuate with weather changes. No other new medical problems in the interval since last visit. Pain Inventory Average Pain 5 Pain Right Now 5 My pain is intermittent, constant, sharp, burning, dull, stabbing, tingling and aching  In the last 24 hours, has pain interfered with the following? General activity 4 Relation with others 5 Enjoyment of life 7 What TIME of day is your pain at its worst? Morning, Daytime, Evening Sleep (in general) Fair  Pain is worse with: walking, bending, sitting, inactivity, standing and some activites Pain improves with: rest, heat/ice, therapy/exercise, pacing activities and medication Relief from Meds: 6  Mobility walk without assistance how many minutes can you walk? 60 ability to climb steps?  yes do you drive?  yes  Function employed # of hrs/week 50+ Do you have any goals in this area?  yes  Neuro/Psych numbness tingling spasms depression anxiety  Prior Studies Any changes since last visit?  no  Physicians involved in your care Any changes since last visit?  no   Review of Systems  Constitutional: Negative.   HENT: Negative.   Eyes: Negative.   Respiratory: Negative.   Cardiovascular: Negative.   Gastrointestinal: Negative.   Genitourinary: Negative.   Musculoskeletal: Negative.   Skin: Negative.   Neurological: Positive for numbness.  Hematological: Negative.   Psychiatric/Behavioral: Negative.        Objective:   Physical Exam  Constitutional: He is oriented to person, place, and time. He appears well-developed and well-nourished.  Musculoskeletal:       Right shoulder: Normal.       Left shoulder: Normal.       Cervical back: He exhibits decreased range of motion.  Neurological: He is alert and oriented to person, place, and time. He has normal strength.    Reflex Scores:      Tricep reflexes are 1+ on the right side and 1+ on the left side.      Bicep reflexes are 1+ on the right side and 1+ on the left side.      Brachioradialis reflexes are 1+ on the right side and 1+ on the left side.      Patellar reflexes are 1+ on the right side and 1+ on the left side.      Achilles reflexes are 1+ on the right side and 1+ on the left side. Psychiatric: He has a normal mood and affect. His behavior is normal. Judgment and thought content normal.          Assessment & Plan:  1. Cervical postlaminectomy syndrome  2. Lumbar postlaminectomy syndrome  3. Chronic postoperative pain we'll continue current medications which consist of Opana ER 10 mg twice a day and oxycodone 5 mg up to twice a day.  4. Will followup in nursing clinic one month and then in PA clinic in 2 months.

## 2012-01-26 NOTE — Patient Instructions (Signed)
Back Exercises Back Exercises These exercises may help you when beginning to rehabilitate your injury. Your symptoms may resolve with or without further involvement from your physician, physical therapist or athletic trainer. While completing these exercises, remember:   Restoring tissue flexibility helps normal motion to return to the joints. This allows healthier, less painful movement and activity.   An effective stretch should be held for at least 30 seconds.   A stretch should never be painful. You should only feel a gentle lengthening or release in the stretched tissue.  STRETCH - Extension, Prone on Elbows   Lie on your stomach on the floor, a bed will be too soft. Place your palms about shoulder width apart and at the height of your head.   Place your elbows under your shoulders. If this is too painful, stack pillows under your chest.   Allow your body to relax so that your hips drop lower and make contact more completely with the floor.   Hold this position for __________ seconds.   Slowly return to lying flat on the floor.  Repeat __________ times. Complete this exercise __________ times per day.  RANGE OF MOTION - Extension, Prone Press Ups   Lie on your stomach on the floor, a bed will be too soft. Place your palms about shoulder width apart and at the height of your head.   Keeping your back as relaxed as possible, slowly straighten your elbows while keeping your hips on the floor. You may adjust the placement of your hands to maximize your comfort. As you gain motion, your hands will come more underneath your shoulders.   Hold this position __________ seconds.   Slowly return to lying flat on the floor.  Repeat __________ times. Complete this exercise __________ times per day.  RANGE OF MOTION- Quadruped, Neutral Spine   Assume a hands and knees position on a firm surface. Keep your hands under your shoulders and your knees under your hips. You may place padding under  your knees for comfort.   Drop your head and point your tail bone toward the ground below you. This will round out your low back like an angry cat. Hold this position for __________ seconds.   Slowly lift your head and release your tail bone so that your back sags into a large arch, like an old horse.   Hold this position for __________ seconds.   Repeat this until you feel limber in your low back.   Now, find your "sweet spot." This will be the most comfortable position somewhere between the two previous positions. This is your neutral spine. Once you have found this position, tense your stomach muscles to support your low back.   Hold this position for __________ seconds.  Repeat __________ times. Complete this exercise __________ times per day.  STRETCH - Flexion, Single Knee to Chest   Lie on a firm bed or floor with both legs extended in front of you.   Keeping one leg in contact with the floor, bring your opposite knee to your chest. Hold your leg in place by either grabbing behind your thigh or at your knee.   Pull until you feel a gentle stretch in your low back. Hold __________ seconds.   Slowly release your grasp and repeat the exercise with the opposite side.  Repeat __________ times. Complete this exercise __________ times per day.  STRETCH - Hamstrings, Standing  Stand or sit and extend your right / left leg, placing your foot on  a chair or foot stool   Keeping a slight arch in your low back and your hips straight forward.   Lead with your chest and lean forward at the waist until you feel a gentle stretch in the back of your right / left knee or thigh. (When done correctly, this exercise requires leaning only a small distance.)   Hold this position for __________ seconds.  Repeat __________ times. Complete this stretch __________ times per day. STRENGTHENING - Deep Abdominals, Pelvic Tilt   Lie on a firm bed or floor. Keeping your legs in front of you, bend your  knees so they are both pointed toward the ceiling and your feet are flat on the floor.   Tense your lower abdominal muscles to press your low back into the floor. This motion will rotate your pelvis so that your tail bone is scooping upwards rather than pointing at your feet or into the floor.   With a gentle tension and even breathing, hold this position for __________ seconds.  Repeat __________ times. Complete this exercise __________ times per day.  STRENGTHENING - Abdominals, Crunches   Lie on a firm bed or floor. Keeping your legs in front of you, bend your knees so they are both pointed toward the ceiling and your feet are flat on the floor. Cross your arms over your chest.   Slightly tip your chin down without bending your neck.   Tense your abdominals and slowly lift your trunk high enough to just clear your shoulder blades. Lifting higher can put excessive stress on the low back and does not further strengthen your abdominal muscles.   Control your return to the starting position.  Repeat __________ times. Complete this exercise __________ times per day.  STRENGTHENING - Quadruped, Opposite UE/LE Lift   Assume a hands and knees position on a firm surface. Keep your hands under your shoulders and your knees under your hips. You may place padding under your knees for comfort.   Find your neutral spine and gently tense your abdominal muscles so that you can maintain this position. Your shoulders and hips should form a rectangle that is parallel with the floor and is not twisted.   Keeping your trunk steady, lift your right hand no higher than your shoulder and then your left leg no higher than your hip. Make sure you are not holding your breath. Hold this position __________ seconds.   Continuing to keep your abdominal muscles tense and your back steady, slowly return to your starting position. Repeat with the opposite arm and leg.  Repeat __________ times. Complete this exercise  __________ times per day. Document Released: 11/06/2005 Document Revised: 10/08/2011 Document Reviewed: 01/31/2009 [facility] Patient Information 2012 [place], [place].

## 2012-01-27 ENCOUNTER — Encounter: Payer: Self-pay | Admitting: Physical Medicine & Rehabilitation

## 2012-02-25 ENCOUNTER — Encounter: Payer: Self-pay | Admitting: *Deleted

## 2012-02-25 ENCOUNTER — Encounter: Payer: BC Managed Care – PPO | Attending: Physical Medicine & Rehabilitation | Admitting: *Deleted

## 2012-02-25 VITALS — BP 120/72 | HR 112 | Resp 14 | Ht 75.0 in | Wt 255.0 lb

## 2012-02-25 DIAGNOSIS — G8928 Other chronic postprocedural pain: Secondary | ICD-10-CM

## 2012-02-25 DIAGNOSIS — M542 Cervicalgia: Secondary | ICD-10-CM | POA: Insufficient documentation

## 2012-02-25 DIAGNOSIS — M961 Postlaminectomy syndrome, not elsewhere classified: Secondary | ICD-10-CM | POA: Insufficient documentation

## 2012-02-25 MED ORDER — OXYMORPHONE HCL ER 10 MG PO TB12: 10.0000 mg | ORAL_TABLET | Freq: Two times a day (BID) | ORAL | Status: DC

## 2012-02-25 MED ORDER — OXYCODONE-ACETAMINOPHEN 5-325 MG PO TABS: 1.0000 | ORAL_TABLET | Freq: Two times a day (BID) | ORAL | Status: DC

## 2012-02-25 NOTE — Progress Notes (Signed)
No changes noted. Advised pt to keep medications in a safe place. 

## 2012-03-25 ENCOUNTER — Encounter: Payer: Self-pay | Admitting: Physical Medicine & Rehabilitation

## 2012-03-25 ENCOUNTER — Ambulatory Visit (HOSPITAL_BASED_OUTPATIENT_CLINIC_OR_DEPARTMENT_OTHER): Payer: BC Managed Care – PPO | Admitting: Physical Medicine & Rehabilitation

## 2012-03-25 ENCOUNTER — Encounter: Payer: BC Managed Care – PPO | Attending: Neurosurgery

## 2012-03-25 VITALS — BP 110/82 | HR 88 | Resp 14 | Ht 73.0 in | Wt 256.0 lb

## 2012-03-25 DIAGNOSIS — M545 Low back pain, unspecified: Secondary | ICD-10-CM | POA: Insufficient documentation

## 2012-03-25 DIAGNOSIS — G8928 Other chronic postprocedural pain: Secondary | ICD-10-CM | POA: Insufficient documentation

## 2012-03-25 DIAGNOSIS — G8929 Other chronic pain: Secondary | ICD-10-CM | POA: Insufficient documentation

## 2012-03-25 DIAGNOSIS — M549 Dorsalgia, unspecified: Secondary | ICD-10-CM

## 2012-03-25 DIAGNOSIS — M961 Postlaminectomy syndrome, not elsewhere classified: Secondary | ICD-10-CM | POA: Insufficient documentation

## 2012-03-25 MED ORDER — OXYMORPHONE HCL ER 10 MG PO TB12: 10.0000 mg | ORAL_TABLET | Freq: Two times a day (BID) | ORAL | Status: DC

## 2012-03-25 MED ORDER — OXYCODONE-ACETAMINOPHEN 5-325 MG PO TABS: 1.0000 | ORAL_TABLET | Freq: Two times a day (BID) | ORAL | Status: DC

## 2012-03-25 NOTE — Progress Notes (Addendum)
Subjective:    Patient ID: David [name], male    DOB: 19-Aug-1976, 36 y.o.   MRN: [id number]  HPI  Walking during break time as well as parking far from office. No change in medical history Pain scores are stable. Pain Inventory Average Pain 6 Pain Right Now 6 My pain is intermittent, constant, sharp, burning, dull, stabbing, tingling and aching  In the last 24 hours, has pain interfered with the following? General activity 7 Relation with others 6 Enjoyment of life 7 What TIME of day is your pain at its worst? all the time Sleep (in general) Fair  Pain is worse with: walking, bending, sitting, inactivity, standing and some activites Pain improves with: rest, heat/ice, therapy/exercise, pacing activities and medication Relief from Meds: 5  Mobility walk without assistance how many minutes can you walk? 60 ability to climb steps?  yes do you drive?  yes Do you have any goals in this area?  no  Function employed # of hrs/week 50 hrs + Do you have any goals in this area?  yes  Neuro/Psych numbness tingling spasms depression anxiety  Prior Studies Any changes since last visit?  no  Physicians involved in your care Any changes since last visit?  no   Family History  Problem Relation Age of Onset  . Arthritis Mother   . Heart disease Mother   . Diabetes Mother   . Heart disease Father   . Diabetes Father    History   Social History  . Marital Status: Married    Spouse Name: N/A    Number of Children: N/A  . Years of Education: N/A   Social History Main Topics  . Smoking status: Never Smoker   . Smokeless tobacco: None  . Alcohol Use: No  . Drug Use: No  . Sexually Active: None   Other Topics Concern  . None   Social History Narrative  . None   History reviewed. No pertinent past surgical history. Past Medical History  Diagnosis Date  . Chronic pain syndrome   . Postlaminectomy syndrome, cervical region   . 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 Thoracic radiculopathy   . Lumbago   . Sciatica   . Primary localized osteoarthrosis, lower leg   . Huntington's chorea   . Pain in joint, lower leg    BP 110/82  Pulse 88  Resp 14  Ht 6\' 1"  (1.854 m)  Wt 256 lb (116.121 kg)  BMI 33.78 kg/m2      Review of Systems  Neurological: Positive for numbness.  Psychiatric/Behavioral: Positive for dysphoric mood.  All other systems reviewed and are negative.       Objective:   Physical Exam  Constitutional: He is oriented to person, place, and time. He appears well-developed and well-nourished.  Musculoskeletal:       Cervical back: He exhibits tenderness.       Lumbar back: He exhibits decreased range of motion. He exhibits no tenderness, no edema, no deformity and no spasm.       Back:  Neurological: He is alert and oriented to person, place, and time. He has normal strength. He exhibits normal muscle tone.  Psychiatric: He has a normal mood and affect.          Assessment & Plan:  1. Cervical postlaminectomy syndrome  2. Lumbar postlaminectomy syndrome  3. Chronic postoperative pain we'll continue current medications which consist of Opana ER 10 mg twice a day and  oxycodone 5 mg up to twice a day.no signs of aberrant drug behavior. PA followup visit in one month continue random urine drug screens and pill count  UDS consistent in May 2013

## 2012-03-25 NOTE — Patient Instructions (Signed)
Continue work walking Next visit you'll see our PA Clydie Braun. You can ask her about some exercises if you wish

## 2012-04-05 ENCOUNTER — Telehealth: Payer: Self-pay

## 2012-04-05 NOTE — Telephone Encounter (Signed)
Lm for pt to call back regarding uds to confirm diabetes due to alcohol in uds.

## 2012-04-29 ENCOUNTER — Encounter: Payer: BC Managed Care – PPO | Attending: Neurosurgery

## 2012-04-29 ENCOUNTER — Ambulatory Visit (HOSPITAL_BASED_OUTPATIENT_CLINIC_OR_DEPARTMENT_OTHER): Payer: BC Managed Care – PPO | Admitting: Physical Medicine & Rehabilitation

## 2012-04-29 ENCOUNTER — Encounter: Payer: Self-pay | Admitting: Physical Medicine & Rehabilitation

## 2012-04-29 VITALS — BP 118/63 | HR 98 | Resp 16 | Ht 74.0 in | Wt 253.0 lb

## 2012-04-29 DIAGNOSIS — M961 Postlaminectomy syndrome, not elsewhere classified: Secondary | ICD-10-CM | POA: Insufficient documentation

## 2012-04-29 DIAGNOSIS — G8929 Other chronic pain: Secondary | ICD-10-CM | POA: Insufficient documentation

## 2012-04-29 DIAGNOSIS — G8928 Other chronic postprocedural pain: Secondary | ICD-10-CM

## 2012-04-29 DIAGNOSIS — M545 Low back pain, unspecified: Secondary | ICD-10-CM | POA: Insufficient documentation

## 2012-04-29 MED ORDER — OXYMORPHONE HCL ER 10 MG PO TB12: 10.0000 mg | ORAL_TABLET | Freq: Two times a day (BID) | ORAL | Status: DC

## 2012-04-29 MED ORDER — OXYCODONE-ACETAMINOPHEN 5-325 MG PO TABS: 1.0000 | ORAL_TABLET | Freq: Two times a day (BID) | ORAL | Status: DC

## 2012-04-29 NOTE — Progress Notes (Signed)
Subjective:    Patient ID: David [name], male    DOB: [birth year], 36 y.o.   MRN: [id number]  HPI Pain Inventory Average Pain 6 Pain Right Now 7 My pain is intermittent, constant, sharp, burning, dull, stabbing, tingling and aching  In the last 24 hours, has pain interfered with the following? General activity 5 Relation with others 5 Enjoyment of life 7 What TIME of day is your pain at its worst? Morning, Daytime and Evening Sleep (in general) Fair  Pain is worse with: walking, bending, sitting, inactivity, standing and some activites Pain improves with: rest, heat/ice, therapy/exercise, pacing activities and medication Relief from Meds: 6  Mobility walk without assistance how many minutes can you walk? 60 ability to climb steps?  yes do you drive?  yes  Function employed # of hrs/week 50+  Neuro/Psych numbness tingling spasms depression anxiety  Prior Studies x-rays CT/MRI  Physicians involved in your care Primary care Dr. Mauricio Po   Family History  Problem Relation Age of Onset  . Arthritis Mother   . Heart disease Mother   . Diabetes Mother   . Heart disease Father   . Diabetes Father    History   Social History  . Marital Status: Married    Spouse Name: N/A    Number of Children: N/A  . Years of Education: N/A   Social History Main Topics  . Smoking status: Never Smoker   . Smokeless tobacco: None  . Alcohol Use: No  . Drug Use: No  . Sexually Active: None   Other Topics Concern  . None   Social History Narrative  . None   History reviewed. No pertinent past surgical history. Past Medical History  Diagnosis Date  . Chronic pain syndrome   . Postlaminectomy syndrome, cervical region   . 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 Thoracic radiculopathy   . Lumbago   . Sciatica   . Primary localized osteoarthrosis, lower leg   . Huntington's chorea   . Pain in joint, lower leg    BP 118/63  Pulse 98   Resp 16  Ht 6\' 2"  (1.88 m)  Wt 253 lb (114.76 kg)  BMI 32.48 kg/m2  SpO2 97%    Review of Systems  Constitutional: Negative.   HENT: Negative.   Eyes: Negative.   Respiratory: Negative.   Cardiovascular: Negative.   Gastrointestinal: Negative.   Genitourinary: Negative.   Musculoskeletal: Positive for back pain.  Skin: Negative.   Neurological: Positive for numbness.       Tingling and Spams  Hematological: Negative.   Psychiatric/Behavioral:       Depression/Anxiety       Objective:   Physical Exam  Constitutional: He is oriented to person, place, and time. He appears well-developed.  Musculoskeletal:       Cervical back: He exhibits normal range of motion.  Neurological: He is alert and oriented to person, place, and time. He has normal strength. A sensory deficit is present. Gait normal.  Psychiatric: He has a normal mood and affect.   Bilateral little finger numbness 2 light touch       Assessment & Plan:   1. Cervical postlaminectomy syndrome  2. Lumbar postlaminectomy syndrome  3. Chronic postoperative pain we'll continue current medications which consist of Opana ER 10 mg twice a day and oxycodone 5 mg up to twice a day.no signs of aberrant drug behavior. PA followup visit in one month continue random urine drug screens  and pill count  UDS consistent in May 2013

## 2012-04-29 NOTE — Patient Instructions (Signed)
Continue current medications If your little finger numbness gets worse we may need to do an EMG to assess for ulnar nerve entrapment at the elbows.

## 2012-05-27 ENCOUNTER — Encounter
Payer: BC Managed Care – PPO | Attending: Physical Medicine and Rehabilitation | Admitting: Physical Medicine and Rehabilitation

## 2012-05-27 ENCOUNTER — Encounter: Payer: Self-pay | Admitting: Physical Medicine and Rehabilitation

## 2012-05-27 VITALS — BP 104/68 | HR 104 | Resp 14 | Ht 75.0 in | Wt 257.0 lb

## 2012-05-27 DIAGNOSIS — G8928 Other chronic postprocedural pain: Secondary | ICD-10-CM | POA: Insufficient documentation

## 2012-05-27 DIAGNOSIS — M961 Postlaminectomy syndrome, not elsewhere classified: Secondary | ICD-10-CM

## 2012-05-27 DIAGNOSIS — Z981 Arthrodesis status: Secondary | ICD-10-CM | POA: Insufficient documentation

## 2012-05-27 DIAGNOSIS — M47817 Spondylosis without myelopathy or radiculopathy, lumbosacral region: Secondary | ICD-10-CM | POA: Insufficient documentation

## 2012-05-27 DIAGNOSIS — R209 Unspecified disturbances of skin sensation: Secondary | ICD-10-CM | POA: Insufficient documentation

## 2012-05-27 DIAGNOSIS — M545 Low back pain, unspecified: Secondary | ICD-10-CM

## 2012-05-27 DIAGNOSIS — M542 Cervicalgia: Secondary | ICD-10-CM | POA: Insufficient documentation

## 2012-05-27 MED ORDER — OXYMORPHONE HCL ER 10 MG PO TB12: 10.0000 mg | ORAL_TABLET | Freq: Two times a day (BID) | ORAL | Status: DC

## 2012-05-27 MED ORDER — OXYCODONE-ACETAMINOPHEN 5-325 MG PO TABS: 1.0000 | ORAL_TABLET | Freq: Two times a day (BID) | ORAL | Status: DC

## 2012-05-27 NOTE — Progress Notes (Signed)
Subjective:    Patient ID: David [name], male    DOB: 1976/08/06, 36 y.o.   MRN: [id number]  HPI The patient complains about chronicneck pain which radiates into his left arm and left chest. The patient also complains about numbness and tingling in the same areas. The patient also complains about LBP, which radiates into both posterior legs.He also complains about radiating numbness and tingling in the same distribution. Hx of ACDF C4-5, C6-7, C7-T1,02/2008 The problem has been stable.  Pain Inventory Average Pain 5 Pain Right Now 6 My pain is intermittent, constant, sharp, burning, dull, stabbing, tingling and aching  In the last 24 hours, has pain interfered with the following? General activity 5 Relation with others 6 Enjoyment of life 7 What TIME of day is your pain at its worst? All Day Sleep (in general) Fair  Pain is worse with: walking, bending, sitting, inactivity, standing and some activites Pain improves with: rest, heat/ice, therapy/exercise, pacing activities and medication Relief from Meds: 5  Mobility walk without assistance how many minutes can you walk? 60 ability to climb steps?  yes do you drive?  yes  Function employed # of hrs/week 50+  Neuro/Psych numbness tingling spasms depression anxiety  Prior Studies Any changes since last visit?  no  Physicians involved in your care Any changes since last visit?  no   Family History  Problem Relation Age of Onset  . Arthritis Mother   . Heart disease Mother   . Diabetes Mother   . Heart disease Father   . Diabetes Father    History   Social History  . Marital Status: Married    Spouse Name: N/A    Number of Children: N/A  . Years of Education: N/A   Social History Main Topics  . Smoking status: Never Smoker   . Smokeless tobacco: None  . Alcohol Use: No  . Drug Use: No  . Sexually Active: None   Other Topics Concern  . None   Social History Narrative  . None   History reviewed. No  pertinent past surgical history. Past Medical History  Diagnosis Date  . Chronic pain syndrome   . Postlaminectomy syndrome, cervical region   . 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 Thoracic radiculopathy   . Lumbago   . Sciatica   . Primary localized osteoarthrosis, lower leg   . Huntington's chorea   . Pain in joint, lower leg    BP 104/68  Pulse 104  Resp 14  Ht 6\' 3"  (1.905 m)  Wt 257 lb (116.574 kg)  BMI 32.12 kg/m2  SpO2 97%      Review of Systems  Constitutional: Negative.   HENT: Negative.   Eyes: Negative.   Respiratory: Negative.   Cardiovascular: Negative.   Gastrointestinal: Negative.   Genitourinary: Negative.   Musculoskeletal: Positive for back pain.  Skin: Negative.   Neurological: Positive for numbness.  Hematological: Negative.   Psychiatric/Behavioral: Negative.        Objective:   Physical Exam  Constitutional: He is oriented to person, place, and time. He appears well-developed and well-nourished.  HENT:  Head: Normocephalic.  Musculoskeletal: He exhibits tenderness.  Neurological: He is alert and oriented to person, place, and time.  Skin: Skin is warm and dry.  Psychiatric: He has a normal mood and affect.     Symmetric normal motor tone is noted throughout. Normal muscle bulk. Muscle testing reveals 5/5 muscle strength of the upper extremity, and 5/5  of the lower extremity. Full range of motion in upper and lower extremities. ROM of spine is restricted. Fine motor movements are normal in both hands.  DTR in the upper and lower extremity are present and symmetric 1+. No clonus is noted.  Patient arises from chair without difficulty. Narrow based gait with normal arm swing bilateral , able to walk on heels and toes . Tandem walk is stable. No pronator drift. Rhomberg negative.      Assessment & Plan:  1. Cervical postlaminectomy syndrome  2. Spondylosis of L-spine 3. Chronic postoperative pain we'll  continue current medications which consist of Opana ER 10 mg twice a day and oxycodone 5 mg up to twice a day. Should continue his walking program and  stretching program, also advised patient to keep a good posture, showed patient some tricks to do that, No signs of aberrant drug behavior. PA followup visit in one month continue random urine drug screens and pill count  UDS consistent in May 2013

## 2012-05-27 NOTE — Patient Instructions (Signed)
Continue with walking and stretching exercises. Keep a good posture, do exercises for posture as I explained, continue with tennis ball massage.

## 2012-06-29 ENCOUNTER — Encounter: Payer: Self-pay | Admitting: Physical Medicine and Rehabilitation

## 2012-06-29 ENCOUNTER — Encounter
Payer: BC Managed Care – PPO | Attending: Physical Medicine and Rehabilitation | Admitting: Physical Medicine and Rehabilitation

## 2012-06-29 VITALS — BP 119/64 | HR 90 | Resp 14 | Ht 74.0 in | Wt 252.0 lb

## 2012-06-29 DIAGNOSIS — M545 Low back pain, unspecified: Secondary | ICD-10-CM | POA: Insufficient documentation

## 2012-06-29 DIAGNOSIS — G8928 Other chronic postprocedural pain: Secondary | ICD-10-CM | POA: Insufficient documentation

## 2012-06-29 DIAGNOSIS — G8929 Other chronic pain: Secondary | ICD-10-CM

## 2012-06-29 DIAGNOSIS — Z981 Arthrodesis status: Secondary | ICD-10-CM | POA: Insufficient documentation

## 2012-06-29 DIAGNOSIS — M47817 Spondylosis without myelopathy or radiculopathy, lumbosacral region: Secondary | ICD-10-CM | POA: Insufficient documentation

## 2012-06-29 DIAGNOSIS — M961 Postlaminectomy syndrome, not elsewhere classified: Secondary | ICD-10-CM

## 2012-06-29 DIAGNOSIS — M542 Cervicalgia: Secondary | ICD-10-CM | POA: Insufficient documentation

## 2012-06-29 DIAGNOSIS — R209 Unspecified disturbances of skin sensation: Secondary | ICD-10-CM | POA: Insufficient documentation

## 2012-06-29 DIAGNOSIS — Z79899 Other long term (current) drug therapy: Secondary | ICD-10-CM | POA: Insufficient documentation

## 2012-06-29 MED ORDER — OXYCODONE-ACETAMINOPHEN 5-325 MG PO TABS: 1.0000 | ORAL_TABLET | Freq: Two times a day (BID) | ORAL | Status: DC

## 2012-06-29 MED ORDER — OXYMORPHONE HCL ER 10 MG PO TB12: 10.0000 mg | ORAL_TABLET | Freq: Two times a day (BID) | ORAL | Status: DC

## 2012-06-29 NOTE — Patient Instructions (Signed)
Continue with your walking and stretching program. 

## 2012-06-29 NOTE — Progress Notes (Signed)
Subjective:    Patient ID: David [name], male    DOB: [birth year], 36 y.o.   MRN: [id number]  HPI The patient complains about chronicneck pain which radiates into his left arm and left chest. The patient also complains about numbness and tingling in the same areas. The patient also complains about LBP, which radiates into both posterior legs.He also complains about radiating numbness and tingling in the same distribution. Hx of ACDF C4-5, C6-7, C7-T1,02/2008  The problem has been stable.   Pain Inventory Average Pain 6 Pain Right Now 5 My pain is constant, sharp, burning, dull, stabbing, tingling and aching  In the last 24 hours, has pain interfered with the following? General activity 5 Relation with others 5 Enjoyment of life 7 What TIME of day is your pain at its worst? varies Sleep (in general) Fair  Pain is worse with: walking, bending, sitting, inactivity, standing and some activites Pain improves with: rest, heat/ice, therapy/exercise, pacing activities and medication Relief from Meds: 6  Mobility walk without assistance how many minutes can you walk? 60 ability to climb steps?  yes do you drive?  yes Do you have any goals in this area?  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  yes  Neuro/Psych numbness tingling spasms depression anxiety  Prior Studies Any changes since last visit?  no  Physicians involved in your care Any changes since last visit?  no   Family History  Problem Relation Age of Onset  . Arthritis Mother   . Heart disease Mother   . Diabetes Mother   . Heart disease Father   . Diabetes Father    History   Social History  . Marital Status: Married    Spouse Name: N/A    Number of Children: N/A  . Years of Education: N/A   Social History Main Topics  . Smoking status: Never Smoker   . Smokeless tobacco: None  . Alcohol Use: No  . Drug Use: No  . Sexually Active: None   Other Topics Concern    . None   Social History Narrative  . None   History reviewed. No pertinent past surgical history. Past Medical History  Diagnosis Date  . Chronic pain syndrome   . Postlaminectomy syndrome, cervical region   . 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 Thoracic radiculopathy   . Lumbago   . Sciatica   . Primary localized osteoarthrosis, lower leg   . Huntington's chorea   . Pain in joint, lower leg    BP 119/64  Pulse 90  Resp 14  Ht 6\' 2"  (1.88 m)  Wt 252 lb (114.306 kg)  BMI 32.35 kg/m2  SpO2 96%     Review of Systems  HENT: Positive for neck pain.   Musculoskeletal: Positive for myalgias, back pain and arthralgias.  Neurological: Positive for numbness.  Psychiatric/Behavioral: Positive for dysphoric mood. The patient is nervous/anxious.   All other systems reviewed and are negative.       Objective:   Physical Exam Constitutional: He is oriented to person, place, and time. He appears well-developed and well-nourished.  HENT:  Head: Normocephalic.  Musculoskeletal: He exhibits tenderness.  Neurological: He is alert and oriented to person, place, and time.  Skin: Skin is warm and dry.  Psychiatric: He has a normal mood and affect.   Symmetric normal motor tone is noted throughout. Normal muscle bulk. Muscle testing reveals 5/5 muscle strength  of the upper extremity, and 5/5 of the lower extremity. Full range of motion in upper and lower extremities. ROM of spine is restricted. Fine motor movements are normal in both hands.  DTR in the upper and lower extremity are present and symmetric 1+. No clonus is noted.  Patient arises from chair without difficulty. Narrow based gait with normal arm swing bilateral , able to walk on heels and toes . Tandem walk is stable. No pronator drift. Rhomberg negative.        Assessment & Plan:  1. Cervical postlaminectomy syndrome  2. Spondylosis of L-spine  3. Chronic postoperative pain we'll  continue current medications which consist of Opana ER 10 mg twice a day and oxycodone 5 mg up to twice a day. Should continue his walking program and stretching program, also advised patient to keep a good posture, showed patient some tricks to do that,  No signs of aberrant drug behavior. PA followup visit in one month continue random urine drug screens and pill count  UDS consistent in May 2013

## 2012-07-28 ENCOUNTER — Encounter
Payer: BC Managed Care – PPO | Attending: Physical Medicine and Rehabilitation | Admitting: Physical Medicine and Rehabilitation

## 2012-07-28 ENCOUNTER — Encounter: Payer: Self-pay | Admitting: Physical Medicine and Rehabilitation

## 2012-07-28 VITALS — BP 114/63 | HR 104 | Resp 14 | Ht 74.0 in | Wt 250.0 lb

## 2012-07-28 DIAGNOSIS — G8929 Other chronic pain: Secondary | ICD-10-CM | POA: Insufficient documentation

## 2012-07-28 DIAGNOSIS — M961 Postlaminectomy syndrome, not elsewhere classified: Secondary | ICD-10-CM | POA: Insufficient documentation

## 2012-07-28 DIAGNOSIS — G8928 Other chronic postprocedural pain: Secondary | ICD-10-CM

## 2012-07-28 DIAGNOSIS — M47817 Spondylosis without myelopathy or radiculopathy, lumbosacral region: Secondary | ICD-10-CM | POA: Insufficient documentation

## 2012-07-28 MED ORDER — OXYCODONE-ACETAMINOPHEN 5-325 MG PO TABS: 1.0000 | ORAL_TABLET | Freq: Two times a day (BID) | ORAL | Status: DC

## 2012-07-28 MED ORDER — OXYMORPHONE HCL ER 10 MG PO TB12: 10.0000 mg | ORAL_TABLET | Freq: Two times a day (BID) | ORAL | Status: DC

## 2012-07-28 NOTE — Patient Instructions (Signed)
Continue with keeping a good posture, continue doing some relaxation exercises.

## 2012-07-28 NOTE — Progress Notes (Signed)
Subjective:    Patient ID: David [name], male    DOB: 13-Jan-1976, 36 y.o.   MRN: [id number]  HPI The patient complains about chronicneck pain which radiates into his left arm and left chest. The patient also complains about numbness and tingling in the same areas. The patient also complains about LBP, which radiates into both posterior legs.He also complains about radiating numbness and tingling in the same distribution. Hx of ACDF C4-5, C6-7, C7-T1,02/2008  The problem has been stable.   Pain Inventory Average Pain 5 Pain Right Now 7 My pain is constant, sharp, burning, dull, stabbing, tingling and aching  In the last 24 hours, has pain interfered with the following? General activity 6 Relation with others 6 Enjoyment of life 7 What TIME of day is your pain at its worst? all the time Sleep (in general) Fair  Pain is worse with: walking, bending, sitting, inactivity, standing and some activites Pain improves with: rest, heat/ice, therapy/exercise, pacing activities and medication Relief from Meds: 6  Mobility walk without assistance how many minutes can you walk? 60 ability to climb steps?  yes do you drive?  yes Do you have any goals in this area?  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  yes  Neuro/Psych tingling spasms depression anxiety  Prior Studies Any changes since last visit?  no  Physicians involved in your care Any changes since last visit?  no   Family History  Problem Relation Age of Onset  . Arthritis Mother   . Heart disease Mother   . Diabetes Mother   . Heart disease Father   . Diabetes Father    History   Social History  . Marital Status: Married    Spouse Name: N/A    Number of Children: N/A  . Years of Education: N/A   Social History Main Topics  . Smoking status: Never Smoker   . Smokeless tobacco: None  . Alcohol Use: No  . Drug Use: No  . Sexually Active: None   Other Topics  Concern  . None   Social History Narrative  . None   History reviewed. No pertinent past surgical history. Past Medical History  Diagnosis Date  . Chronic pain syndrome   . Postlaminectomy syndrome, cervical region   . 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 Thoracic radiculopathy   . Lumbago   . Sciatica   . Primary localized osteoarthrosis, lower leg   . Huntington's chorea   . Pain in joint, lower leg    BP 114/63  Pulse 104  Resp 14  Ht 6\' 2"  (1.88 m)  Wt 250 lb (113.399 kg)  BMI 32.10 kg/m2  SpO2 97%     Review of Systems  HENT: Positive for neck pain.   Musculoskeletal: Positive for myalgias, back pain and arthralgias.  Psychiatric/Behavioral: Positive for dysphoric mood. The patient is nervous/anxious.   All other systems reviewed and are negative.       Objective:   Physical Exam Constitutional: He is oriented to person, place, and time. He appears well-developed and well-nourished.  HENT:  Head: Normocephalic.  Musculoskeletal: He exhibits tenderness.  Neurological: He is alert and oriented to person, place, and time.  Skin: Skin is warm and dry.  Psychiatric: He has a normal mood and affect.  Symmetric normal motor tone is noted throughout. Normal muscle bulk. Muscle testing reveals 5/5 muscle strength of the upper extremity, and  5/5 of the lower extremity. Full range of motion in upper and lower extremities. ROM of spine is restricted. Fine motor movements are normal in both hands.  DTR in the upper and lower extremity are present and symmetric 1+. No clonus is noted.  Patient arises from chair without difficulty. Narrow based gait with normal arm swing bilateral , able to walk on heels and toes . Tandem walk is stable. No pronator drift. Rhomberg negative.        Assessment & Plan:  1. Cervical postlaminectomy syndrome  2. Spondylosis of L-spine  3. Chronic postoperative pain we'll continue current medications which  consist of Opana ER 10 mg twice a day and oxycodone 5 mg up to twice a day. Should continue his walking program and stretching program, also advised patient to keep a good posture, showed patient some tricks to do that. Offered patient to prescribe PT mainly for pain relief and relaxation of muscles, patient states, that he is doing exercises on his own right now, but might consider it if his pain gets worse.  No signs of aberrant drug behavior. PA followup visit in one month continue random urine drug screens and pill count  UDS consistent in May 2013

## 2012-08-25 ENCOUNTER — Encounter
Payer: BC Managed Care – PPO | Attending: Physical Medicine and Rehabilitation | Admitting: Physical Medicine and Rehabilitation

## 2012-08-25 ENCOUNTER — Encounter: Payer: Self-pay | Admitting: Physical Medicine and Rehabilitation

## 2012-08-25 VITALS — BP 126/70 | HR 96 | Resp 14 | Ht 74.0 in | Wt 251.0 lb

## 2012-08-25 DIAGNOSIS — G8928 Other chronic postprocedural pain: Secondary | ICD-10-CM | POA: Insufficient documentation

## 2012-08-25 DIAGNOSIS — M47817 Spondylosis without myelopathy or radiculopathy, lumbosacral region: Secondary | ICD-10-CM | POA: Insufficient documentation

## 2012-08-25 DIAGNOSIS — M545 Low back pain, unspecified: Secondary | ICD-10-CM | POA: Insufficient documentation

## 2012-08-25 DIAGNOSIS — M542 Cervicalgia: Secondary | ICD-10-CM

## 2012-08-25 DIAGNOSIS — R0789 Other chest pain: Secondary | ICD-10-CM | POA: Insufficient documentation

## 2012-08-25 DIAGNOSIS — M79609 Pain in unspecified limb: Secondary | ICD-10-CM | POA: Insufficient documentation

## 2012-08-25 DIAGNOSIS — M961 Postlaminectomy syndrome, not elsewhere classified: Secondary | ICD-10-CM | POA: Insufficient documentation

## 2012-08-25 DIAGNOSIS — M549 Dorsalgia, unspecified: Secondary | ICD-10-CM

## 2012-08-25 DIAGNOSIS — Z5181 Encounter for therapeutic drug level monitoring: Secondary | ICD-10-CM

## 2012-08-25 DIAGNOSIS — R209 Unspecified disturbances of skin sensation: Secondary | ICD-10-CM | POA: Insufficient documentation

## 2012-08-25 MED ORDER — OXYMORPHONE HCL ER 10 MG PO TB12: 10.0000 mg | ORAL_TABLET | Freq: Two times a day (BID) | ORAL | Status: DC

## 2012-08-25 MED ORDER — OXYCODONE-ACETAMINOPHEN 5-325 MG PO TABS: 1.0000 | ORAL_TABLET | Freq: Two times a day (BID) | ORAL | Status: DC

## 2012-08-25 NOTE — Progress Notes (Signed)
Subjective:    Patient ID: David [name], male    DOB: 02-16-1976, 36 y.o.   MRN: [id number]  HPI The patient complains about chronicneck pain which radiates into his left arm and left chest. The patient also complains about numbness and tingling in the same areas. The patient also complains about LBP, which radiates into both posterior legs.He also complains about radiating numbness and tingling in the same distribution. Hx of ACDF C4-5, C6-7, C7-T1,02/2008  The problem has been stable.   Pain Inventory Average Pain 6 Pain Right Now 5 My pain is intermittent, constant, sharp, burning, dull, stabbing, tingling and aching  In the last 24 hours, has pain interfered with the following? General activity 5 Relation with others 6 Enjoyment of life 7 What TIME of day is your pain at its worst? all the time Sleep (in general) Fair  Pain is worse with: walking, bending, sitting, inactivity, standing and some activites Pain improves with: rest, heat/ice, therapy/exercise, pacing activities and medication Relief from Meds: 5  Mobility walk without assistance how many minutes can you walk? 60 ability to climb steps?  yes do you drive?  yes Do you have any goals in this area?  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  yes  Neuro/Psych numbness tingling spasms depression anxiety  Prior Studies Any changes since last visit?  no  Physicians involved in your care Any changes since last visit?  no   Family History  Problem Relation Age of Onset  . Arthritis Mother   . Heart disease Mother   . Diabetes Mother   . Heart disease Father   . Diabetes Father    History   Social History  . Marital Status: Married    Spouse Name: N/A    Number of Children: N/A  . Years of Education: N/A   Social History Main Topics  . Smoking status: Never Smoker   . Smokeless tobacco: None  . Alcohol Use: No  . Drug Use: No  . Sexually Active: None    Other Topics Concern  . None   Social History Narrative  . None   History reviewed. No pertinent past surgical history. Past Medical History  Diagnosis Date  . Chronic pain syndrome   . Postlaminectomy syndrome, cervical region   . 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 Thoracic radiculopathy   . Lumbago   . Sciatica   . Primary localized osteoarthrosis, lower leg   . Huntington's chorea   . Pain in joint, lower leg    BP 126/70  Pulse 96  Resp 14  Ht 6\' 2"  (1.88 m)  Wt 251 lb (113.853 kg)  BMI 32.23 kg/m2  SpO2 97%     Review of Systems  HENT: Positive for neck pain.   Musculoskeletal: Positive for myalgias, back pain and arthralgias.  Neurological: Positive for numbness.  Psychiatric/Behavioral: Positive for dysphoric mood. The patient is nervous/anxious.   All other systems reviewed and are negative.       Objective:   Physical Exam Constitutional: He is oriented to person, place, and time. He appears well-developed and well-nourished.  HENT:  Head: Normocephalic.  Musculoskeletal: He exhibits tenderness.  Neurological: He is alert and oriented to person, place, and time.  Skin: Skin is warm and dry.  Psychiatric: He has a normal mood and affect.  Symmetric normal motor tone is noted throughout. Normal muscle bulk. Muscle testing reveals 5/5 muscle  strength of the upper extremity, and 5/5 of the lower extremity. Full range of motion in upper and lower extremities. ROM of spine is restricted. Fine motor movements are normal in both hands.  DTR in the upper and lower extremity are present and symmetric 1+. No clonus is noted.  Patient arises from chair without difficulty. Narrow based gait with normal arm swing bilateral , able to walk on heels and toes . Tandem walk is stable. No pronator drift. Rhomberg negative.        Assessment & Plan:  1. Cervical postlaminectomy syndrome  2. Spondylosis of L-spine  3. Chronic  postoperative pain we'll continue current medications which consist of Opana ER 10 mg twice a day and oxycodone 5 mg up to twice a day. Should continue his walking program and stretching program, also advised patient to keep a good posture, showed patient some tricks to do that. Offered patient to prescribe PT mainly for pain relief and relaxation of muscles, patient states, that he is doing exercises on his own right now, but might consider it if his pain gets worse.  No signs of aberrant drug behavior. PA followup visit in one month continue random urine drug screens and pill count  UDS consistent in May 2013

## 2012-08-25 NOTE — Patient Instructions (Signed)
Try to keep a good posture, continue with walking and exercise program.

## 2012-09-23 ENCOUNTER — Encounter: Payer: Self-pay | Admitting: Physical Medicine and Rehabilitation

## 2012-09-23 ENCOUNTER — Encounter
Payer: BC Managed Care – PPO | Attending: Physical Medicine and Rehabilitation | Admitting: Physical Medicine and Rehabilitation

## 2012-09-23 VITALS — BP 134/71 | HR 95 | Resp 14 | Ht 75.0 in | Wt 250.6 lb

## 2012-09-23 DIAGNOSIS — M47817 Spondylosis without myelopathy or radiculopathy, lumbosacral region: Secondary | ICD-10-CM | POA: Insufficient documentation

## 2012-09-23 DIAGNOSIS — G8928 Other chronic postprocedural pain: Secondary | ICD-10-CM | POA: Insufficient documentation

## 2012-09-23 DIAGNOSIS — M961 Postlaminectomy syndrome, not elsewhere classified: Secondary | ICD-10-CM

## 2012-09-23 MED ORDER — OXYCODONE-ACETAMINOPHEN 5-325 MG PO TABS: 1.0000 | ORAL_TABLET | Freq: Two times a day (BID) | ORAL | Status: DC

## 2012-09-23 MED ORDER — OXYMORPHONE HCL ER 10 MG PO TB12: 10.0000 mg | ORAL_TABLET | Freq: Two times a day (BID) | ORAL | Status: DC

## 2012-09-23 NOTE — Patient Instructions (Signed)
Try to stay as active as tolerated, continue with posture training and walking program.

## 2012-09-23 NOTE — Progress Notes (Signed)
Subjective:    Patient ID: David [name], male    DOB: Mar 25, 1976, 36 y.o.   MRN: [id number]  HPI The patient complains about chronicneck pain which radiates into his left arm and left chest. The patient also complains about numbness and tingling in the same areas. The patient also complains about LBP, which radiates into both posterior legs.He also complains about radiating numbness and tingling in the same distribution. Hx of ACDF C4-5, C6-7, C7-T1,02/2008  The problem has been stable.   Pain Inventory Average Pain 5 Pain Right Now 6 My pain is constant, sharp, burning, dull, stabbing, tingling and aching  In the last 24 hours, has pain interfered with the following? General activity 5 Relation with others 5 Enjoyment of life 7 What TIME of day is your pain at its worst? all of the time Sleep (in general) Fair  Pain is worse with: walking, bending, sitting, inactivity, standing and some activites Pain improves with: rest, heat/ice, therapy/exercise and medication Relief from Meds: 5  Mobility walk without assistance how many minutes can you walk? 60 ability to climb steps?  yes do you drive?  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  no  Physicians involved in your care Any changes since last visit?  no   Family History  Problem Relation Age of Onset  . Arthritis Mother   . Heart disease Mother   . Diabetes Mother   . Heart disease Father   . Diabetes Father    History   Social History  . Marital Status: Married    Spouse Name: N/A    Number of Children: N/A  . Years of Education: N/A   Social History Main Topics  . Smoking status: Never Smoker   . Smokeless tobacco: Never Used  . Alcohol Use: No  . Drug Use: No  . Sexually Active: None   Other Topics Concern  . None   Social History Narrative  . None    History reviewed. No pertinent past surgical history. Past Medical History  Diagnosis Date  . Chronic pain syndrome   . Postlaminectomy syndrome, cervical region   . 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 Thoracic radiculopathy   . Lumbago   . Sciatica   . Primary localized osteoarthrosis, lower leg   . Huntington's chorea   . Pain in joint, lower leg    BP 134/71  Pulse 95  Resp 14  Ht 6\' 3"  (1.905 m)  Wt 250 lb 9.6 oz (113.671 kg)  BMI 31.32 kg/m2  SpO2 98%    Review of Systems  HENT: Positive for neck pain.   Musculoskeletal: Positive for back pain.       Spasms  Neurological: Positive for dizziness and numbness.       Tingling  Psychiatric/Behavioral: Positive for dysphoric mood. The patient is nervous/anxious.   All other systems reviewed and are negative.       Objective:   Physical Exam Constitutional: He is oriented to person, place, and time. He appears well-developed and well-nourished.  HENT:  Head: Normocephalic.  Musculoskeletal: He exhibits tenderness.  Neurological: He is alert and oriented to person, place, and time.  Skin: Skin is warm and dry.  Psychiatric: He has a normal mood and affect.  Symmetric normal motor tone is noted throughout. Normal muscle bulk. Muscle testing reveals 5/5 muscle strength  of the upper extremity, and 5/5 of the lower extremity. Full range of motion in upper and lower extremities. ROM of spine is restricted. Fine motor movements are normal in both hands.  DTR in the upper and lower extremity are present and symmetric 1+. No clonus is noted.  Patient arises from chair without difficulty. Narrow based gait with normal arm swing bilateral , able to walk on heels and toes . Tandem walk is stable. No pronator drift. Rhomberg negative.        Assessment & Plan:  1. Cervical postlaminectomy syndrome  2. Spondylosis of L-spine  3. Chronic postoperative pain we'll continue current medications  which consist of Opana ER 10 mg twice a day and oxycodone 5 mg up to twice a day. Should continue his walking program and stretching program, also advised patient to keep a good posture, showed patient some tricks to do that. Offered patient to prescribe PT mainly for pain relief and relaxation of muscles, patient states, that he is doing exercises on his own right now, but might consider it if his pain gets worse.  No signs of aberrant drug behavior. PA followup visit in one month continue random urine drug screens and pill count  UDS consistent in May 2013

## 2012-10-21 ENCOUNTER — Ambulatory Visit (HOSPITAL_BASED_OUTPATIENT_CLINIC_OR_DEPARTMENT_OTHER): Payer: BC Managed Care – PPO | Admitting: Physical Medicine & Rehabilitation

## 2012-10-21 ENCOUNTER — Encounter: Payer: BC Managed Care – PPO | Attending: Physical Medicine and Rehabilitation

## 2012-10-21 ENCOUNTER — Encounter: Payer: Self-pay | Admitting: Physical Medicine & Rehabilitation

## 2012-10-21 VITALS — BP 110/68 | HR 90 | Resp 14 | Ht 74.0 in | Wt 249.0 lb

## 2012-10-21 DIAGNOSIS — G8928 Other chronic postprocedural pain: Secondary | ICD-10-CM

## 2012-10-21 DIAGNOSIS — M961 Postlaminectomy syndrome, not elsewhere classified: Secondary | ICD-10-CM

## 2012-10-21 DIAGNOSIS — M47817 Spondylosis without myelopathy or radiculopathy, lumbosacral region: Secondary | ICD-10-CM | POA: Insufficient documentation

## 2012-10-21 DIAGNOSIS — G8929 Other chronic pain: Secondary | ICD-10-CM | POA: Insufficient documentation

## 2012-10-21 MED ORDER — OXYMORPHONE HCL ER 10 MG PO TB12: 10.0000 mg | ORAL_TABLET | Freq: Two times a day (BID) | ORAL | Status: DC

## 2012-10-21 MED ORDER — OXYCODONE-ACETAMINOPHEN 5-325 MG PO TABS: 1.0000 | ORAL_TABLET | Freq: Two times a day (BID) | ORAL | Status: DC

## 2012-10-21 NOTE — Patient Instructions (Addendum)
Continue your walking every day Continue current medications

## 2012-10-21 NOTE — Progress Notes (Signed)
Subjective:    Patient ID: David [name], male    DOB: 1976/06/21, 36 y.o.   MRN: [id number]  HPI No new medical problems. No med changes No activity Walks 1 hour per day in divided  Pain Inventory Average Pain 5 Pain Right Now 5 My pain is intermittent, constant, sharp, burning, dull, stabbing, tingling and aching  In the last 24 hours, has pain interfered with the following? General activity 5 Relation with others 5 Enjoyment of life 7 What TIME of day is your pain at its worst? all the time Sleep (in general) Fair  Pain is worse with: walking, bending, sitting, inactivity, standing and some activites Pain improves with: rest, heat/ice, therapy/exercise and medication Relief from Meds: 5  Mobility walk without assistance how many minutes can you walk? 60 ability to climb steps?  yes do you drive?  yes Do you have any goals in this area?  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  yes  Neuro/Psych numbness tingling spasms depression anxiety  Prior Studies Any changes since last visit?  no  Physicians involved in your care Any changes since last visit?  no   Family History  Problem Relation Age of Onset  . Arthritis Mother   . Heart disease Mother   . Diabetes Mother   . Heart disease Father   . Diabetes Father    History   Social History  . Marital Status: Married    Spouse Name: N/A    Number of Children: N/A  . Years of Education: N/A   Social History Main Topics  . Smoking status: Never Smoker   . Smokeless tobacco: Never Used  . Alcohol Use: No  . Drug Use: No  . Sexually Active: None   Other Topics Concern  . None   Social History Narrative  . None   History reviewed. No pertinent past surgical history. Past Medical History  Diagnosis Date  . Chronic pain syndrome   . Postlaminectomy syndrome, cervical region   . 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  Thoracic radiculopathy   . Lumbago   . Sciatica   . Primary localized osteoarthrosis, lower leg   . Huntington's chorea   . Pain in joint, lower leg    BP 110/68  Pulse 90  Resp 14  Ht 6\' 2"  (1.88 m)  Wt 249 lb (112.946 kg)  BMI 31.97 kg/m2  SpO2 98%     Review of Systems  HENT: Positive for neck pain.   Musculoskeletal: Positive for myalgias, back pain and arthralgias.  Neurological: Positive for numbness.  Psychiatric/Behavioral: Positive for dysphoric mood. The patient is nervous/anxious.   All other systems reviewed and are negative.       Objective:   Physical Exam  Nursing note and vitals reviewed. Constitutional: He is oriented to person, place, and time. He appears well-developed and well-nourished.  HENT:  Head: Normocephalic and atraumatic.  Eyes: Conjunctivae normal and EOM are normal. Pupils are equal, round, and reactive to light.  Neck: Normal range of motion.  Musculoskeletal:       Lumbar back: He exhibits normal range of motion and no tenderness.  Neurological: He is alert and oriented to person, place, and time. He has normal strength. A sensory deficit is present. He exhibits normal muscle tone. Gait normal.  Reflex Scores:      Tricep reflexes are 1+ on the right side and 1+ on  the left side.      Bicep reflexes are 1+ on the right side and 1+ on the left side.      Brachioradialis reflexes are 1+ on the right side and 1+ on the left side.      Patellar reflexes are 1+ on the right side and 1+ on the left side.      Achilles reflexes are 1+ on the right side and 1+ on the left side. Psychiatric: He has a normal mood and affect.          Assessment & Plan:  1. Cervical postlaminectomy syndrome  2. Lumbar postlaminectomy syndrome  3. Chronic postoperative pain we'll continue current medications which consist of Opana ER 10 mg twice a day and oxycodone 5 mg up to twice a day.no signs of aberrant drug behavior. PA followup visit in one month  continue random urine drug screens and pill count

## 2012-11-23 ENCOUNTER — Encounter: Payer: Self-pay | Admitting: Physical Medicine and Rehabilitation

## 2012-11-23 ENCOUNTER — Encounter
Payer: BC Managed Care – PPO | Attending: Physical Medicine and Rehabilitation | Admitting: Physical Medicine and Rehabilitation

## 2012-11-23 VITALS — BP 110/75 | HR 88 | Resp 14 | Ht 74.0 in | Wt 248.0 lb

## 2012-11-23 DIAGNOSIS — M961 Postlaminectomy syndrome, not elsewhere classified: Secondary | ICD-10-CM

## 2012-11-23 DIAGNOSIS — R209 Unspecified disturbances of skin sensation: Secondary | ICD-10-CM | POA: Insufficient documentation

## 2012-11-23 DIAGNOSIS — M47817 Spondylosis without myelopathy or radiculopathy, lumbosacral region: Secondary | ICD-10-CM | POA: Insufficient documentation

## 2012-11-23 DIAGNOSIS — M542 Cervicalgia: Secondary | ICD-10-CM | POA: Insufficient documentation

## 2012-11-23 DIAGNOSIS — G8928 Other chronic postprocedural pain: Secondary | ICD-10-CM | POA: Insufficient documentation

## 2012-11-23 DIAGNOSIS — M545 Low back pain, unspecified: Secondary | ICD-10-CM | POA: Insufficient documentation

## 2012-11-23 DIAGNOSIS — Z981 Arthrodesis status: Secondary | ICD-10-CM | POA: Insufficient documentation

## 2012-11-23 MED ORDER — OXYCODONE-ACETAMINOPHEN 5-325 MG PO TABS: 1.0000 | ORAL_TABLET | Freq: Two times a day (BID) | ORAL | Status: DC

## 2012-11-23 MED ORDER — OXYMORPHONE HCL ER 10 MG PO TB12: 10.0000 mg | ORAL_TABLET | Freq: Two times a day (BID) | ORAL | Status: DC

## 2012-11-23 NOTE — Progress Notes (Signed)
Subjective:    Patient ID: David [name], male    DOB: 1976/05/22, 37 y.o.   MRN: [id number]  HPI The patient complains about chronicneck pain which radiates into his left arm and left chest. The patient also complains about numbness and tingling in the same areas. The patient also complains about LBP, which radiates into both posterior legs.He also complains about radiating numbness and tingling in the same distribution. Hx of ACDF C4-5, C6-7, C7-T1,02/2008  The problem has been stable.   Pain Inventory Average Pain 6 Pain Right Now 7 My pain is intermittent, constant, sharp, burning, dull, stabbing, tingling and aching  In the last 24 hours, has pain interfered with the following? General activity 6 Relation with others 5 Enjoyment of life 8 What TIME of day is your pain at its worst? all the time Sleep (in general) Fair  Pain is worse with: walking, bending, sitting, inactivity, standing and some activites Pain improves with: rest, heat/ice, therapy/exercise, pacing activities and medication Relief from Meds: 5  Mobility walk without assistance how many minutes can you walk? 60 ability to climb steps?  yes do you drive?  yes Do you have any goals in this area?  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  yes  Neuro/Psych numbness tingling spasms depression anxiety  Prior Studies Any changes since last visit?  no  Physicians involved in your care Any changes since last visit?  no   Family History  Problem Relation Age of Onset  . Arthritis Mother   . Heart disease Mother   . Diabetes Mother   . Heart disease Father   . Diabetes Father    History   Social History  . Marital Status: Married    Spouse Name: N/A    Number of Children: N/A  . Years of Education: N/A   Social History Main Topics  . Smoking status: Never Smoker   . Smokeless tobacco: Never Used  . Alcohol Use: No  . Drug Use: No  . Sexually  Active: None   Other Topics Concern  . None   Social History Narrative  . None   No past surgical history on file. Past Medical History  Diagnosis Date  . Chronic pain syndrome   . Postlaminectomy syndrome, cervical region   . 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 Thoracic radiculopathy   . Lumbago   . Sciatica   . Primary localized osteoarthrosis, lower leg   . Huntington's chorea   . Pain in joint, lower leg    BP 110/75  Pulse 88  Resp 14  Ht 6\' 2"  (1.88 m)  Wt 248 lb (112.492 kg)  BMI 31.84 kg/m2  SpO2 99%     Review of Systems  HENT: Positive for neck pain.   Musculoskeletal: Positive for back pain.  Neurological: Positive for numbness.  Psychiatric/Behavioral: Positive for dysphoric mood. The patient is nervous/anxious.   All other systems reviewed and are negative.       Objective:   Physical Exam Constitutional: He is oriented to person, place, and time. He appears well-developed and well-nourished.  HENT:  Head: Normocephalic.  Musculoskeletal: He exhibits tenderness.  Neurological: He is alert and oriented to person, place, and time.  Skin: Skin is warm and dry.  Psychiatric: He has a normal mood and affect.  Symmetric normal motor tone is noted throughout. Normal muscle bulk. Muscle testing reveals 5/5 muscle strength of  the upper extremity, and 5/5 of the lower extremity. Full range of motion in upper and lower extremities. ROM of spine is restricted, C-spine mainly rotation worse to the left. Fine motor movements are normal in both hands.  DTR in the upper and lower extremity are present and symmetric 1+. No clonus is noted.  Patient arises from chair without difficulty. Narrow based gait with normal arm swing bilateral , able to walk on heels and toes . Tandem walk is stable. No pronator drift. Rhomberg negative.        Assessment & Plan:  1. Cervical postlaminectomy syndrome, Hx of ACDF C4-5, C6-7, C7-T1, 02/2008.    2. Spondylosis of L-spine  3. Chronic postoperative pain we'll continue current medications which consist of Opana ER 10 mg twice a day and oxycodone 5 mg up to twice a day. Should continue his walking program and stretching program, also advised patient to keep a good posture, showed patient some tricks to do that. Offered patient to prescribe PT mainly for pain relief and relaxation of muscles, patient states, that he is doing exercises on his own right now, but might consider it if his pain gets worse.  No signs of aberrant drug behavior. PA followup visit in one month continue random urine drug screens and pill count  UDS consistent in May 2013

## 2012-11-23 NOTE — Patient Instructions (Signed)
Try to keep a good posture, try to do some stretching exercises, when you sit on a desk for too long.

## 2012-12-23 ENCOUNTER — Encounter: Payer: Self-pay | Admitting: Physical Medicine and Rehabilitation

## 2012-12-23 ENCOUNTER — Encounter
Payer: BC Managed Care – PPO | Attending: Physical Medicine and Rehabilitation | Admitting: Physical Medicine and Rehabilitation

## 2012-12-23 VITALS — BP 123/61 | HR 95 | Resp 14 | Ht 74.0 in | Wt 246.0 lb

## 2012-12-23 DIAGNOSIS — Z981 Arthrodesis status: Secondary | ICD-10-CM | POA: Insufficient documentation

## 2012-12-23 DIAGNOSIS — G8928 Other chronic postprocedural pain: Secondary | ICD-10-CM | POA: Insufficient documentation

## 2012-12-23 DIAGNOSIS — M961 Postlaminectomy syndrome, not elsewhere classified: Secondary | ICD-10-CM | POA: Insufficient documentation

## 2012-12-23 DIAGNOSIS — M47817 Spondylosis without myelopathy or radiculopathy, lumbosacral region: Secondary | ICD-10-CM | POA: Insufficient documentation

## 2012-12-23 MED ORDER — OXYCODONE-ACETAMINOPHEN 5-325 MG PO TABS: 1.0000 | ORAL_TABLET | Freq: Two times a day (BID) | ORAL | Status: DC

## 2012-12-23 MED ORDER — OXYMORPHONE HCL ER 10 MG PO TB12: 10.0000 mg | ORAL_TABLET | Freq: Two times a day (BID) | ORAL | Status: DC

## 2012-12-23 NOTE — Patient Instructions (Signed)
Continue with your stretches and posture training.

## 2012-12-23 NOTE — Progress Notes (Signed)
Subjective:    Patient ID: David [name], male    DOB: 13-May-1976, 37 y.o.   MRN: [id number]  HPI The patient complains about chronicneck pain which radiates into his left arm and left chest. The patient also complains about numbness and tingling in the same areas. The patient also complains about LBP, which radiates into both posterior legs.He also complains about radiating numbness and tingling in the same distribution. Hx of ACDF C4-5, C6-7, C7-T1,02/2008  The problem has been stable.   Pain Inventory Average Pain 5 Pain Right Now 6 My pain is intermittent, constant, sharp, burning, dull, stabbing, tingling and aching  In the last 24 hours, has pain interfered with the following? General activity 6 Relation with others 6 Enjoyment of life 7 What TIME of day is your pain at its worst? all the time Sleep (in general) Fair  Pain is worse with: walking, bending, sitting, inactivity, standing and some activites Pain improves with: rest, heat/ice, pacing activities and medication Relief from Meds: 6  Mobility walk without assistance how many minutes can you walk? 60 ability to climb steps?  yes do you drive?  yes Do you have any goals in this area?  no  Function employed # of hrs/week [employer] Do you have any goals in this area?  yes  Neuro/Psych numbness tingling spasms depression anxiety  Prior Studies Any changes since last visit?  no  Physicians involved in your care Any changes since last visit?  no   Family History  Problem Relation Age of Onset  . Arthritis Mother   . Heart disease Mother   . Diabetes Mother   . Heart disease Father   . Diabetes Father    History   Social History  . Marital Status: Married    Spouse Name: N/A    Number of Children: N/A  . Years of Education: N/A   Social History Main Topics  . Smoking status: Never Smoker   . Smokeless tobacco: Never Used  . Alcohol Use: No  . Drug Use: No  . Sexually Active: None   Other  Topics Concern  . None   Social History Narrative  . None   History reviewed. No pertinent past surgical history. Past Medical History  Diagnosis Date  . Chronic pain syndrome   . Postlaminectomy syndrome, cervical region   . 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 Thoracic radiculopathy   . Lumbago   . Sciatica   . Primary localized osteoarthrosis, lower leg   . Huntington's chorea   . Pain in joint, lower leg    BP 123/61  Pulse 95  Resp 14  Ht 6\' 2"  (1.88 m)  Wt 246 lb (111.585 kg)  BMI 31.57 kg/m2  SpO2 98%     Review of Systems  HENT: Positive for neck pain.   Musculoskeletal: Positive for back pain.  Neurological: Positive for numbness.  Psychiatric/Behavioral: Positive for dysphoric mood. The patient is nervous/anxious.   All other systems reviewed and are negative.       Objective:   Physical Exam Constitutional: He is oriented to person, place, and time. He appears well-developed and well-nourished.  HENT:  Head: Normocephalic.  Musculoskeletal: He exhibits tenderness.  Neurological: He is alert and oriented to person, place, and time.  Skin: Skin is warm and dry.  Psychiatric: He has a normal mood and affect.  Symmetric normal motor tone is noted throughout. Normal muscle bulk. Muscle testing reveals 5/5 muscle strength of  the upper extremity, and 5/5 of the lower extremity. Full range of motion in upper and lower extremities. ROM of spine is restricted, C-spine mainly rotation worse to the left. Fine motor movements are normal in both hands.  DTR in the upper and lower extremity are present and symmetric 1+. No clonus is noted.  Patient arises from chair without difficulty. Narrow based gait with normal arm swing bilateral , able to walk on heels and toes . Tandem walk is stable. No pronator drift. Rhomberg negative.        Assessment & Plan:  1. Cervical postlaminectomy syndrome, Hx of ACDF C4-5, C6-7, C7-T1, 02/2008.  2.  Spondylosis of L-spine  3. Chronic postoperative pain we'll continue current medications which consist of Opana ER 10 mg twice a day and oxycodone 5 mg up to twice a day. Should continue his walking program and stretching program, also advised patient to keep a good posture, showed patient some tricks to do that. Offered patient to prescribe PT mainly for pain relief and relaxation of muscles, patient states, that he is doing exercises on his own right now, but might consider it if his pain gets worse.  No signs of aberrant drug behavior. PA followup visit in one month continue random urine drug screens and pill count  UDS consistent in May 2013

## 2013-01-20 ENCOUNTER — Encounter: Payer: Self-pay | Admitting: Physical Medicine and Rehabilitation

## 2013-01-20 ENCOUNTER — Encounter
Payer: BC Managed Care – PPO | Attending: Physical Medicine and Rehabilitation | Admitting: Physical Medicine and Rehabilitation

## 2013-01-20 VITALS — BP 109/70 | HR 109 | Resp 14 | Ht 73.0 in | Wt 243.0 lb

## 2013-01-20 DIAGNOSIS — G8929 Other chronic pain: Secondary | ICD-10-CM | POA: Insufficient documentation

## 2013-01-20 DIAGNOSIS — M961 Postlaminectomy syndrome, not elsewhere classified: Secondary | ICD-10-CM | POA: Insufficient documentation

## 2013-01-20 DIAGNOSIS — Z5181 Encounter for therapeutic drug level monitoring: Secondary | ICD-10-CM

## 2013-01-20 DIAGNOSIS — M542 Cervicalgia: Secondary | ICD-10-CM | POA: Insufficient documentation

## 2013-01-20 DIAGNOSIS — Z981 Arthrodesis status: Secondary | ICD-10-CM | POA: Insufficient documentation

## 2013-01-20 DIAGNOSIS — R079 Chest pain, unspecified: Secondary | ICD-10-CM | POA: Insufficient documentation

## 2013-01-20 DIAGNOSIS — M47817 Spondylosis without myelopathy or radiculopathy, lumbosacral region: Secondary | ICD-10-CM | POA: Insufficient documentation

## 2013-01-20 DIAGNOSIS — M79609 Pain in unspecified limb: Secondary | ICD-10-CM | POA: Insufficient documentation

## 2013-01-20 DIAGNOSIS — Z79899 Other long term (current) drug therapy: Secondary | ICD-10-CM

## 2013-01-20 DIAGNOSIS — G8928 Other chronic postprocedural pain: Secondary | ICD-10-CM | POA: Insufficient documentation

## 2013-01-20 MED ORDER — OXYMORPHONE HCL ER 10 MG PO TB12: 10.0000 mg | ORAL_TABLET | Freq: Two times a day (BID) | ORAL | Status: DC

## 2013-01-20 MED ORDER — OXYCODONE-ACETAMINOPHEN 5-325 MG PO TABS: 1.0000 | ORAL_TABLET | Freq: Two times a day (BID) | ORAL | Status: DC

## 2013-01-20 NOTE — Patient Instructions (Signed)
Continue with your stretches and posture training. Continue with your walking program.

## 2013-01-20 NOTE — Progress Notes (Signed)
Subjective:    Patient ID: David [name], male    DOB: 09-Dec-1975, 37 y.o.   MRN: [id number]  HPI The patient complains about chronicneck pain which radiates into his left arm and left chest. The patient also complains about numbness and tingling in the same areas. The patient also complains about LBP, which radiates into both posterior legs.He also complains about radiating numbness and tingling in the same distribution. Hx of ACDF C4-5, C6-7, C7-T1,02/2008  The problem has been stable. The patient states, that he walks more and has lost some weight, he feels pretty well overall.  Pain Inventory Average Pain 5 Pain Right Now 5 My pain is intermittent, constant, sharp, burning, dull, stabbing, tingling and aching  In the last 24 hours, has pain interfered with the following? General activity 5 Relation with others 5 Enjoyment of life 6 What TIME of day is your pain at its worst? all the time Sleep (in general) Fair  Pain is worse with: walking, bending, sitting, standing and some activites Pain improves with: rest, heat/ice, pacing activities and medication Relief from Meds: 5  Mobility walk without assistance how many minutes can you walk? 60 ability to climb steps?  yes do you drive?  yes Do you have any goals in this area?  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  yes  Neuro/Psych tingling spasms depression anxiety  Prior Studies Any changes since last visit?  no  Physicians involved in your care Any changes since last visit?  no   Family History  Problem Relation Age of Onset  . Arthritis Mother   . Heart disease Mother   . Diabetes Mother   . Heart disease Father   . Diabetes Father    History   Social History  . Marital Status: Married    Spouse Name: N/A    Number of Children: N/A  . Years of Education: N/A   Social History Main Topics  . Smoking status: Never Smoker   . Smokeless tobacco: Never Used  .  Alcohol Use: No  . Drug Use: No  . Sexually Active: None   Other Topics Concern  . None   Social History Narrative  . None   History reviewed. No pertinent past surgical history. Past Medical History  Diagnosis Date  . Chronic pain syndrome   . Postlaminectomy syndrome, cervical region   . 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 Thoracic radiculopathy   . Lumbago   . Sciatica   . Primary localized osteoarthrosis, lower leg   . Huntington's chorea   . Pain in joint, lower leg    BP 109/70  Pulse 109  Resp 14  Ht 6\' 1"  (1.854 m)  Wt 243 lb (110.224 kg)  BMI 32.07 kg/m2  SpO2 98%     Review of Systems  HENT: Positive for neck pain.   Psychiatric/Behavioral: Positive for dysphoric mood. The patient is nervous/anxious.   All other systems reviewed and are negative.       Objective:   Physical Exam Constitutional: He is oriented to person, place, and time. He appears well-developed and well-nourished.  HENT:  Head: Normocephalic.  Musculoskeletal: He exhibits tenderness.  Neurological: He is alert and oriented to person, place, and time.  Skin: Skin is warm and dry.  Psychiatric: He has a normal mood and affect.  Symmetric normal motor tone is noted throughout. Normal muscle bulk. Muscle testing reveals 5/5 muscle  strength of the upper extremity, and 5/5 of the lower extremity. Full range of motion in upper and lower extremities. ROM of spine is restricted, C-spine mainly rotation worse to the left. Fine motor movements are normal in both hands.  DTR in the upper and lower extremity are present and symmetric 1+. No clonus is noted.  Patient arises from chair without difficulty. Narrow based gait with normal arm swing bilateral , able to walk on heels and toes . Tandem walk is stable. No pronator drift. Rhomberg negative.        Assessment & Plan:  1. Cervical postlaminectomy syndrome, Hx of ACDF C4-5, C6-7, C7-T1, 02/2008.  2.  Spondylosis of L-spine  3. Chronic postoperative pain we'll continue current medications which consist of Opana ER 10 mg twice a day and oxycodone 5 mg up to twice a day. Should continue his walking program and stretching program, also advised patient to keep a good posture, showed patient some tricks to do that. Offered patient to prescribe PT mainly for pain relief and relaxation of muscles, patient states, that he is doing exercises on his own right now, but might consider it if his pain gets worse.  No signs of aberrant drug behavior. PA followup visit in one month continue random urine drug screens and pill count  UDS consistent in May 2013

## 2013-01-23 NOTE — Progress Notes (Signed)
David [name] last UDS was 08/25/2012 please update your documentation

## 2013-02-22 ENCOUNTER — Encounter: Payer: Self-pay | Admitting: Physical Medicine and Rehabilitation

## 2013-02-22 ENCOUNTER — Encounter
Payer: BC Managed Care – PPO | Attending: Physical Medicine and Rehabilitation | Admitting: Physical Medicine and Rehabilitation

## 2013-02-22 VITALS — BP 123/69 | HR 101 | Resp 14 | Ht 74.0 in | Wt 244.0 lb

## 2013-02-22 DIAGNOSIS — M47817 Spondylosis without myelopathy or radiculopathy, lumbosacral region: Secondary | ICD-10-CM | POA: Insufficient documentation

## 2013-02-22 DIAGNOSIS — M545 Low back pain, unspecified: Secondary | ICD-10-CM

## 2013-02-22 DIAGNOSIS — R209 Unspecified disturbances of skin sensation: Secondary | ICD-10-CM | POA: Insufficient documentation

## 2013-02-22 DIAGNOSIS — M961 Postlaminectomy syndrome, not elsewhere classified: Secondary | ICD-10-CM

## 2013-02-22 DIAGNOSIS — M542 Cervicalgia: Secondary | ICD-10-CM | POA: Insufficient documentation

## 2013-02-22 DIAGNOSIS — Z981 Arthrodesis status: Secondary | ICD-10-CM | POA: Insufficient documentation

## 2013-02-22 DIAGNOSIS — G8928 Other chronic postprocedural pain: Secondary | ICD-10-CM | POA: Insufficient documentation

## 2013-02-22 MED ORDER — OXYCODONE-ACETAMINOPHEN 5-325 MG PO TABS: 1.0000 | ORAL_TABLET | Freq: Two times a day (BID) | ORAL | Status: DC

## 2013-02-22 MED ORDER — OXYMORPHONE HCL ER 10 MG PO TB12: 10.0000 mg | ORAL_TABLET | Freq: Two times a day (BID) | ORAL | Status: DC

## 2013-02-22 NOTE — Progress Notes (Signed)
Subjective:    Patient ID: David [name], male    DOB: 08/27/1976, 37 y.o.   MRN: [id number]  HPI The patient complains about chronicneck pain which radiates into his left arm and left chest. The patient also complains about numbness and tingling in the same areas. The patient also complains about LBP, which radiates into both posterior legs.He also complains about radiating numbness and tingling in the same distribution. Hx of ACDF C4-5, C6-7, C7-T1,02/2008  The problem has been stable. The patient states, that he walks more and has lost some weight, he feels pretty well overall.  Pain Inventory Average Pain 5 Pain Right Now 5 My pain is intermittent, constant, sharp, burning, dull, stabbing, tingling and aching  In the last 24 hours, has pain interfered with the following? General activity 5 Relation with others 5 Enjoyment of life 5 What TIME of day is your pain at its worst? all the time Sleep (in general) Fair  Pain is worse with: walking, bending, sitting, inactivity, standing and some activites Pain improves with: rest, heat/ice, therapy/exercise and medication Relief from Meds: 6  Mobility walk without assistance how many minutes can you walk? 60 ability to climb steps?  yes do you drive?  yes Do you have any goals in this area?  yes  Function employed # of hrs/week [employer] Do you have any goals in this area?  yes  Neuro/Psych numbness tingling spasms depression anxiety  Prior Studies Any changes since last visit?  no  Physicians involved in your care Any changes since last visit?  no   Family History  Problem Relation Age of Onset  . Arthritis Mother   . Heart disease Mother   . Diabetes Mother   . Heart disease Father   . Diabetes Father    History   Social History  . Marital Status: Married    Spouse Name: N/A    Number of Children: N/A  . Years of Education: N/A   Social History Main Topics  . Smoking status: Never Smoker   . Smokeless  tobacco: Never Used  . Alcohol Use: No  . Drug Use: No  . Sexually Active: None   Other Topics Concern  . None   Social History Narrative  . None   History reviewed. No pertinent past surgical history. Past Medical History  Diagnosis Date  . Chronic pain syndrome   . Postlaminectomy syndrome, cervical region   . 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 Thoracic radiculopathy   . Lumbago   . Sciatica   . Primary localized osteoarthrosis, lower leg   . Huntington's chorea   . Pain in joint, lower leg    BP 123/69  Pulse 101  Resp 14  Ht 6\' 2"  (1.88 m)  Wt 244 lb (110.678 kg)  BMI 31.31 kg/m2  SpO2 97%     Review of Systems  Neurological: Positive for numbness.  Psychiatric/Behavioral: Positive for dysphoric mood. The patient is nervous/anxious.   All other systems reviewed and are negative.       Objective:   Physical Exam Constitutional: He is oriented to person, place, and time. He appears well-developed and well-nourished.  HENT:  Head: Normocephalic.  Musculoskeletal: He exhibits tenderness.  Neurological: He is alert and oriented to person, place, and time.  Skin: Skin is warm and dry.  Psychiatric: He has a normal mood and affect.  Symmetric normal motor tone is noted throughout. Normal muscle bulk. Muscle testing reveals 5/5 muscle  strength of the upper extremity, and 5/5 of the lower extremity. Full range of motion in upper and lower extremities. ROM of spine is restricted, C-spine mainly into rotation, worse to the left. Fine motor movements are normal in both hands.  DTR in the upper and lower extremity are present and symmetric 1+. No clonus is noted.  Patient arises from chair without difficulty. Narrow based gait with normal arm swing bilateral , able to walk on heels and toes . Tandem walk is stable. No pronator drift. Rhomberg negative.        Assessment & Plan:  1. Cervical postlaminectomy syndrome, Hx of ACDF C4-5,  C6-7, C7-T1, 02/2008.  2. Spondylosis of L-spine  3. Chronic postoperative pain we'll continue current medications which consist of Opana ER 10 mg twice a day and oxycodone 5 mg up to twice a day. Should continue his walking program and stretching program, also advised patient to keep a good posture, showed patient some tricks to do that. Offered patient to prescribe PT mainly for pain relief and relaxation of muscles, patient states, that he is doing exercises on his own right now, but might consider it if his pain gets worse.  No signs of aberrant drug behavior. PA followup visit in one month continue random urine drug screens and pill count  UDS consistent in May 2013

## 2013-02-22 NOTE — Patient Instructions (Signed)
Stay as active as tolerated, continue with your walking program

## 2013-03-22 ENCOUNTER — Encounter: Payer: Self-pay | Admitting: Physical Medicine and Rehabilitation

## 2013-03-22 ENCOUNTER — Encounter
Payer: BC Managed Care – PPO | Attending: Physical Medicine and Rehabilitation | Admitting: Physical Medicine and Rehabilitation

## 2013-03-22 VITALS — BP 126/60 | HR 103 | Resp 14 | Ht 74.0 in | Wt 244.4 lb

## 2013-03-22 DIAGNOSIS — M545 Low back pain, unspecified: Secondary | ICD-10-CM | POA: Insufficient documentation

## 2013-03-22 DIAGNOSIS — M542 Cervicalgia: Secondary | ICD-10-CM | POA: Insufficient documentation

## 2013-03-22 DIAGNOSIS — M961 Postlaminectomy syndrome, not elsewhere classified: Secondary | ICD-10-CM

## 2013-03-22 DIAGNOSIS — G8928 Other chronic postprocedural pain: Secondary | ICD-10-CM | POA: Insufficient documentation

## 2013-03-22 DIAGNOSIS — M47817 Spondylosis without myelopathy or radiculopathy, lumbosacral region: Secondary | ICD-10-CM | POA: Insufficient documentation

## 2013-03-22 DIAGNOSIS — R209 Unspecified disturbances of skin sensation: Secondary | ICD-10-CM | POA: Insufficient documentation

## 2013-03-22 DIAGNOSIS — Z981 Arthrodesis status: Secondary | ICD-10-CM | POA: Insufficient documentation

## 2013-03-22 MED ORDER — OXYCODONE-ACETAMINOPHEN 5-325 MG PO TABS: 1.0000 | ORAL_TABLET | Freq: Two times a day (BID) | ORAL | Status: DC

## 2013-03-22 MED ORDER — OXYMORPHONE HCL ER 10 MG PO TB12: 10.0000 mg | ORAL_TABLET | Freq: Two times a day (BID) | ORAL | Status: DC

## 2013-03-22 NOTE — Patient Instructions (Signed)
Continue with your walking and exercise program 

## 2013-03-22 NOTE — Progress Notes (Signed)
Please update documentation to reflect most recent UDS

## 2013-03-22 NOTE — Progress Notes (Signed)
Subjective:    Patient ID: David [name], male    DOB: Sep 14, 1976, 37 y.o.   MRN: [id number]  HPI The patient complains about chronicneck pain which radiates into his left arm and left chest. The patient also complains about numbness and tingling in the same areas. The patient also complains about LBP, which radiates into both posterior legs.He also complains about radiating numbness and tingling in the same distribution. Hx of ACDF C4-5, C6-7, C7-T1,02/2008  The problem has been stable. The patient states, that he walks more and has lost some weight, he feels pretty well overall.   Pain Inventory Average Pain 6 Pain Right Now 5 My pain is intermittent, constant, sharp, burning, dull, stabbing, tingling and aching  In the last 24 hours, has pain interfered with the following? General activity 7 Relation with others 5 Enjoyment of life 7 What TIME of day is your pain at its worst? all Sleep (in general) Fair  Pain is worse with: walking, bending, sitting, inactivity, standing and some activites Pain improves with: rest, heat/ice, therapy/exercise, pacing activities and medication Relief from Meds: 5  Mobility walk without assistance how many minutes can you walk? 60 ability to climb steps?  yes do you drive?  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  no  Physicians involved in your care Any changes since last visit?  no   Family History  Problem Relation Age of Onset  . Arthritis Mother   . Heart disease Mother   . Diabetes Mother   . Heart disease Father   . Diabetes Father    History   Social History  . Marital Status: Married    Spouse Name: N/A    Number of Children: N/A  . Years of Education: N/A   Social History Main Topics  . Smoking status: Never Smoker   . Smokeless tobacco: Never Used  . Alcohol Use: No  . Drug  Use: No  . Sexually Active: None   Other Topics Concern  . None   Social History Narrative  . None   History reviewed. No pertinent past surgical history. Past Medical History  Diagnosis Date  . Chronic pain syndrome   . Postlaminectomy syndrome, cervical region   . 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 Thoracic radiculopathy   . Lumbago   . Sciatica   . Primary localized osteoarthrosis, lower leg   . Huntington's chorea   . Pain in joint, lower leg    BP 126/60  Pulse 103  Resp 14  Ht 6\' 2"  (1.88 m)  Wt 244 lb 6.4 oz (110.859 kg)  BMI 31.37 kg/m2  SpO2 96%   Review of Systems  HENT: Positive for neck pain.   Musculoskeletal: Positive for back pain.       Spasms  Neurological:       Tingling  Psychiatric/Behavioral: Positive for dysphoric mood. The patient is nervous/anxious.   All other systems reviewed and are negative.       Objective:   Physical Exam Constitutional: He is oriented to person, place, and time. He appears well-developed and well-nourished.  HENT:  Head: Normocephalic.  Musculoskeletal: He exhibits tenderness.  Neurological: He is alert and oriented to person, place, and time.  Skin: Skin is warm and dry.  Psychiatric: He has a normal mood and affect.  Symmetric normal motor tone is noted  throughout. Normal muscle bulk. Muscle testing reveals 5/5 muscle strength of the upper extremity, and 5/5 of the lower extremity. Full range of motion in upper and lower extremities. ROM of spine is restricted, C-spine mainly into rotation, worse to the left. Fine motor movements are normal in both hands.  DTR in the upper and lower extremity are present and symmetric 1+. No clonus is noted.  Patient arises from chair without difficulty. Narrow based gait with normal arm swing bilateral , able to walk on heels and toes . Tandem walk is stable. No pronator drift. Rhomberg negative.        Assessment & Plan:  1. Cervical  postlaminectomy syndrome, Hx of ACDF C4-5, C6-7, C7-T1, 02/2008.  2. Spondylosis of L-spine  3. Chronic postoperative pain we'll continue current medications which consist of Opana ER 10 mg twice a day and oxycodone 5 mg up to twice a day. Should continue his walking program and stretching program, also advised patient to keep a good posture, showed patient some tricks to do that. Offered patient to prescribe PT mainly for pain relief and relaxation of muscles, patient states, that he is doing exercises on his own right now, but might consider it if his pain gets worse.  No signs of aberrant drug behavior. PA followup visit in one month continue random urine drug screens and pill count  UDS consistent in May 2013

## 2013-04-21 ENCOUNTER — Encounter
Payer: BC Managed Care – PPO | Attending: Physical Medicine and Rehabilitation | Admitting: Physical Medicine and Rehabilitation

## 2013-04-21 ENCOUNTER — Encounter: Payer: Self-pay | Admitting: Physical Medicine and Rehabilitation

## 2013-04-21 VITALS — BP 120/76 | HR 84 | Resp 16 | Ht 75.0 in | Wt 247.0 lb

## 2013-04-21 DIAGNOSIS — R079 Chest pain, unspecified: Secondary | ICD-10-CM | POA: Insufficient documentation

## 2013-04-21 DIAGNOSIS — M961 Postlaminectomy syndrome, not elsewhere classified: Secondary | ICD-10-CM

## 2013-04-21 DIAGNOSIS — M47817 Spondylosis without myelopathy or radiculopathy, lumbosacral region: Secondary | ICD-10-CM | POA: Insufficient documentation

## 2013-04-21 DIAGNOSIS — M542 Cervicalgia: Secondary | ICD-10-CM | POA: Insufficient documentation

## 2013-04-21 DIAGNOSIS — G8928 Other chronic postprocedural pain: Secondary | ICD-10-CM | POA: Insufficient documentation

## 2013-04-21 DIAGNOSIS — M79609 Pain in unspecified limb: Secondary | ICD-10-CM | POA: Insufficient documentation

## 2013-04-21 DIAGNOSIS — R209 Unspecified disturbances of skin sensation: Secondary | ICD-10-CM | POA: Insufficient documentation

## 2013-04-21 DIAGNOSIS — Z981 Arthrodesis status: Secondary | ICD-10-CM | POA: Insufficient documentation

## 2013-04-21 MED ORDER — OXYCODONE-ACETAMINOPHEN 5-325 MG PO TABS: 1.0000 | ORAL_TABLET | Freq: Two times a day (BID) | ORAL | Status: DC

## 2013-04-21 MED ORDER — OXYMORPHONE HCL ER 10 MG PO TB12: 10.0000 mg | ORAL_TABLET | Freq: Two times a day (BID) | ORAL | Status: DC

## 2013-04-21 NOTE — Patient Instructions (Signed)
Continue with your walking program, stretches and posture training.

## 2013-04-21 NOTE — Progress Notes (Signed)
Subjective:    Patient ID: David [name], male    DOB: Apr 01, 1976, 37 y.o.   MRN: [id number]  HPI The patient complains about chronicneck pain which radiates into his left arm and left chest. The patient also complains about numbness and tingling in the same areas. The patient also complains about LBP, which radiates into both posterior legs.He also complains about radiating numbness and tingling in the same distribution. Hx of ACDF C4-5, C6-7, C7-T1,02/2008  The problem has been stable. The patient states, that he walks more and has lost some weight, he feels pretty well overall.  Pain Inventory Average Pain 6 Pain Right Now 6 My pain is intermittent, constant, sharp, burning, dull, stabbing, tingling and aching  In the last 24 hours, has pain interfered with the following? General activity 5 Relation with others 5 Enjoyment of life 6 What TIME of day is your pain at its worst? constant Sleep (in general) Fair  Pain is worse with: walking, bending, sitting, inactivity, standing and some activites Pain improves with: rest, heat/ice, therapy/exercise, pacing activities and medication Relief from Meds: 5  Mobility walk without assistance how many minutes can you walk? 60 ability to climb steps?  yes do you drive?  yes Do you have any goals in this area?  yes  Function employed # of hrs/week 50  Neuro/Psych numbness tingling spasms depression anxiety  Prior Studies Any changes since last visit?  no  Physicians involved in your care Any changes since last visit?  no   Family History  Problem Relation Age of Onset  . Arthritis Mother   . Heart disease Mother   . Diabetes Mother   . Heart disease Father   . Diabetes Father    History   Social History  . Marital Status: Married    Spouse Name: N/A    Number of Children: N/A  . Years of Education: N/A   Social History Main Topics  . Smoking status: Never Smoker   . Smokeless tobacco: Never Used  . Alcohol  Use: No  . Drug Use: No  . Sexually Active: None   Other Topics Concern  . None   Social History Narrative  . None   History reviewed. No pertinent past surgical history. Past Medical History  Diagnosis Date  . Chronic pain syndrome   . Postlaminectomy syndrome, cervical region   . 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 Thoracic radiculopathy   . Lumbago   . Sciatica   . Primary localized osteoarthrosis, lower leg   . Huntington's chorea   . Pain in joint, lower leg    BP 120/76  Pulse 84  Resp 16  Ht 6\' 3"  (1.905 m)  Wt 247 lb (112.038 kg)  BMI 30.87 kg/m2  SpO2 99%     Review of Systems  Neurological: Positive for numbness.       Tingling  Psychiatric/Behavioral: Positive for dysphoric mood and agitation.  All other systems reviewed and are negative.       Objective:   Physical Exam Constitutional: He is oriented to person, place, and time. He appears well-developed and well-nourished.  HENT:  Head: Normocephalic.  Musculoskeletal: He exhibits tenderness.  Neurological: He is alert and oriented to person, place, and time.  Skin: Skin is warm and dry.  Psychiatric: He has a normal mood and affect.  Symmetric normal motor tone is noted throughout. Normal muscle bulk. Muscle testing reveals 5/5 muscle strength of the upper extremity, and 5/5  of the lower extremity. Full range of motion in upper and lower extremities. ROM of spine is restricted, C-spine mainly into rotation, worse to the left. Fine motor movements are normal in both hands.  DTR in the upper and lower extremity are present and symmetric 1+. No clonus is noted.  Patient arises from chair without difficulty. Narrow based gait with normal arm swing bilateral , able to walk on heels and toes . Tandem walk is stable. No pronator drift. Rhomberg negative.        Assessment & Plan:  1. Cervical postlaminectomy syndrome, Hx of ACDF C4-5, C6-7, C7-T1, 02/2008.  2.  Spondylosis of L-spine  3. Chronic postoperative pain we'll continue current medications which consist of Opana ER 10 mg twice a day and oxycodone 5 mg up to twice a day. Should continue his walking program and stretching program, also advised patient to keep a good posture, showed patient some tricks to do that. Offered patient to prescribe PT mainly for pain relief and relaxation of muscles, patient states, that he is doing exercises on his own right now, but might consider it if his pain gets worse.  No signs of aberrant drug behavior. PA followup visit in one month continue random urine drug screens and pill count  UDS consistent in May 2013

## 2013-04-26 ENCOUNTER — Ambulatory Visit: Payer: BC Managed Care – PPO | Admitting: Physical Medicine and Rehabilitation

## 2013-05-24 ENCOUNTER — Encounter
Payer: BC Managed Care – PPO | Attending: Physical Medicine and Rehabilitation | Admitting: Physical Medicine and Rehabilitation

## 2013-05-24 ENCOUNTER — Encounter: Payer: Self-pay | Admitting: Physical Medicine and Rehabilitation

## 2013-05-24 VITALS — BP 124/72 | HR 95 | Resp 14 | Ht 75.0 in | Wt 243.0 lb

## 2013-05-24 DIAGNOSIS — M545 Low back pain, unspecified: Secondary | ICD-10-CM | POA: Insufficient documentation

## 2013-05-24 DIAGNOSIS — M47817 Spondylosis without myelopathy or radiculopathy, lumbosacral region: Secondary | ICD-10-CM | POA: Insufficient documentation

## 2013-05-24 DIAGNOSIS — M961 Postlaminectomy syndrome, not elsewhere classified: Secondary | ICD-10-CM | POA: Insufficient documentation

## 2013-05-24 DIAGNOSIS — M542 Cervicalgia: Secondary | ICD-10-CM | POA: Insufficient documentation

## 2013-05-24 DIAGNOSIS — R209 Unspecified disturbances of skin sensation: Secondary | ICD-10-CM | POA: Insufficient documentation

## 2013-05-24 DIAGNOSIS — M79609 Pain in unspecified limb: Secondary | ICD-10-CM | POA: Insufficient documentation

## 2013-05-24 DIAGNOSIS — G8928 Other chronic postprocedural pain: Secondary | ICD-10-CM | POA: Insufficient documentation

## 2013-05-24 DIAGNOSIS — Z981 Arthrodesis status: Secondary | ICD-10-CM | POA: Insufficient documentation

## 2013-05-24 MED ORDER — OXYMORPHONE HCL ER 10 MG PO TB12: 10.0000 mg | ORAL_TABLET | Freq: Two times a day (BID) | ORAL | Status: DC

## 2013-05-24 MED ORDER — OXYCODONE-ACETAMINOPHEN 5-325 MG PO TABS: 1.0000 | ORAL_TABLET | Freq: Two times a day (BID) | ORAL | Status: DC

## 2013-05-24 NOTE — Patient Instructions (Signed)
Continue with your walking and posture training.

## 2013-05-24 NOTE — Progress Notes (Signed)
Subjective:    Patient ID: David [name], male    DOB: Feb 03, 1976, 37 y.o.   MRN: [id number]  HPI The patient complains about chronicneck pain which radiates into his left arm and left chest. The patient also complains about numbness and tingling in the same areas. The patient also complains about LBP, which radiates into both posterior legs.He also complains about radiating numbness and tingling in the same distribution. Hx of ACDF C4-5, C6-7, C7-T1,02/2008  The problem has been stable. The patient states, that he walks more and has lost some weight, he feels pretty well overall, although he has a little more stress, because his aunt is very sick, and he has to take care of her some.  Pain Inventory Average Pain 5 Pain Right Now 7 My pain is constant, sharp, burning, dull, stabbing, tingling and aching  In the last 24 hours, has pain interfered with the following? General activity 5 Relation with others 6 Enjoyment of life 6 What TIME of day is your pain at its worst? constant Sleep (in general) Fair  Pain is worse with: walking, bending, sitting, inactivity, standing and some activites Pain improves with: rest, heat/ice, therapy/exercise, pacing activities and medication Relief from Meds: 5  Mobility walk without assistance how many minutes can you walk? 60 ability to climb steps?  yes do you drive?  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  yes  Neuro/Psych numbness tingling spasms depression anxiety  Prior Studies Any changes since last visit?  no  Physicians involved in your care Any changes since last visit?  no   Family History  Problem Relation Age of Onset  . Arthritis Mother   . Heart disease Mother   . Diabetes Mother   . Heart disease Father   . Diabetes Father    History   Social History  . Marital Status: Married    Spouse Name: N/A    Number of Children: N/A  . Years of Education: N/A   Social  History Main Topics  . Smoking status: Never Smoker   . Smokeless tobacco: Never Used  . Alcohol Use: No  . Drug Use: No  . Sexually Active: None   Other Topics Concern  . None   Social History Narrative  . None   History reviewed. No pertinent past surgical history. Past Medical History  Diagnosis Date  . Chronic pain syndrome   . Postlaminectomy syndrome, cervical region   . 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 Thoracic radiculopathy   . Lumbago   . Sciatica   . Primary localized osteoarthrosis, lower leg   . Huntington's chorea   . Pain in joint, lower leg    BP 124/72  Pulse 95  Resp 14  Ht 6\' 3"  (1.905 m)  Wt 243 lb (110.224 kg)  BMI 30.37 kg/m2  SpO2 99%     Review of Systems  Neurological: Positive for numbness.  Psychiatric/Behavioral: Positive for dysphoric mood. The patient is nervous/anxious.   All other systems reviewed and are negative.       Objective:   Physical Exam Constitutional: He is oriented to person, place, and time. He appears well-developed and well-nourished.  HENT:  Head: Normocephalic.  Musculoskeletal: He exhibits tenderness.  Neurological: He is alert and oriented to person, place, and time.  Skin: Skin is warm and dry.  Psychiatric: He has a normal mood and affect.  Symmetric normal motor tone  is noted throughout. Normal muscle bulk. Muscle testing reveals 5/5 muscle strength of the upper extremity, and 5/5 of the lower extremity. Full range of motion in upper and lower extremities. ROM of spine is restricted, C-spine mainly into rotation, worse to the left. Fine motor movements are normal in both hands.  DTR in the upper and lower extremity are present and symmetric 1+. No clonus is noted.  Patient arises from chair without difficulty. Narrow based gait with normal arm swing bilateral , able to walk on heels and toes . Tandem walk is stable. No pronator drift. Rhomberg negative.         Assessment & Plan:  1. Cervical postlaminectomy syndrome, Hx of ACDF C4-5, C6-7, C7-T1, 02/2008.  2. Spondylosis of L-spine  3. Chronic postoperative pain we'll continue current medications which consist of Opana ER 10 mg twice a day and oxycodone 5 mg up to twice a day. Should continue his walking program and stretching program, also advised patient to keep a good posture, showed patient some tricks to do that. Offered patient to prescribe PT mainly for pain relief and relaxation of muscles, patient states, that he is doing exercises on his own right now, but might consider it if his pain gets worse.  No signs of aberrant drug behavior. PA followup visit in one month continue random urine drug screens and pill count  UDS consistent in March 2014

## 2013-06-23 ENCOUNTER — Encounter: Payer: BC Managed Care – PPO | Attending: Physical Medicine and Rehabilitation

## 2013-06-23 ENCOUNTER — Ambulatory Visit (HOSPITAL_BASED_OUTPATIENT_CLINIC_OR_DEPARTMENT_OTHER): Payer: BC Managed Care – PPO | Admitting: Physical Medicine & Rehabilitation

## 2013-06-23 ENCOUNTER — Encounter: Payer: Self-pay | Admitting: Physical Medicine & Rehabilitation

## 2013-06-23 VITALS — BP 119/70 | HR 96 | Resp 14 | Ht 75.0 in | Wt 241.0 lb

## 2013-06-23 DIAGNOSIS — M542 Cervicalgia: Secondary | ICD-10-CM | POA: Insufficient documentation

## 2013-06-23 DIAGNOSIS — Z5181 Encounter for therapeutic drug level monitoring: Secondary | ICD-10-CM

## 2013-06-23 DIAGNOSIS — M961 Postlaminectomy syndrome, not elsewhere classified: Secondary | ICD-10-CM | POA: Insufficient documentation

## 2013-06-23 DIAGNOSIS — R209 Unspecified disturbances of skin sensation: Secondary | ICD-10-CM | POA: Insufficient documentation

## 2013-06-23 DIAGNOSIS — G8928 Other chronic postprocedural pain: Secondary | ICD-10-CM | POA: Insufficient documentation

## 2013-06-23 DIAGNOSIS — M47817 Spondylosis without myelopathy or radiculopathy, lumbosacral region: Secondary | ICD-10-CM | POA: Insufficient documentation

## 2013-06-23 DIAGNOSIS — M79609 Pain in unspecified limb: Secondary | ICD-10-CM | POA: Insufficient documentation

## 2013-06-23 DIAGNOSIS — M549 Dorsalgia, unspecified: Secondary | ICD-10-CM

## 2013-06-23 DIAGNOSIS — Z79899 Other long term (current) drug therapy: Secondary | ICD-10-CM

## 2013-06-23 DIAGNOSIS — R079 Chest pain, unspecified: Secondary | ICD-10-CM | POA: Insufficient documentation

## 2013-06-23 DIAGNOSIS — Z981 Arthrodesis status: Secondary | ICD-10-CM | POA: Insufficient documentation

## 2013-06-23 MED ORDER — OXYCODONE-ACETAMINOPHEN 5-325 MG PO TABS: 1.0000 | ORAL_TABLET | Freq: Two times a day (BID) | ORAL | Status: DC

## 2013-06-23 MED ORDER — OXYMORPHONE HCL ER 10 MG PO TB12: 10.0000 mg | ORAL_TABLET | Freq: Two times a day (BID) | ORAL | Status: DC

## 2013-06-23 NOTE — Progress Notes (Signed)
Subjective:    Patient ID: David [name], male    DOB: 07/01/1976, 37 y.o.   MRN: [id number]  HPI The patient complains about chronicneck pain which radiates into his left arm and left chest. The patient also complains about numbness and tingling in the same areas. The patient also complains about LBP, which radiates into both posterior legs.He also complains about radiating numbness and tingling in the same distribution. Hx of ACDF C4-5, C6-7, C7-T1,02/2008  The problem has been stable. No constipation Pain Inventory Average Pain 5 Pain Right Now 7 My pain is intermittent, constant, sharp, burning, dull, stabbing, tingling and aching  In the last 24 hours, has pain interfered with the following? General activity 6 Relation with others 6 Enjoyment of life 7 What TIME of day is your pain at its worst? all Sleep (in general) Fair  Pain is worse with: walking, bending, sitting, inactivity, standing and some activites Pain improves with: rest, heat/ice, pacing activities, medication and injections Relief from Meds: 5  Mobility walk without assistance how many minutes can you walk? 60 ability to climb steps?  yes do you drive?  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  no  Physicians involved in your care Any changes since last visit?  no   Family History  Problem Relation Age of Onset  . Arthritis Mother   . Heart disease Mother   . Diabetes Mother   . Heart disease Father   . Diabetes Father    History   Social History  . Marital Status: Married    Spouse Name: N/A    Number of Children: N/A  . Years of Education: N/A   Social History Main Topics  . Smoking status: Never Smoker   . Smokeless tobacco: Never Used  . Alcohol Use: No  . Drug Use: No  . Sexual Activity: None   Other Topics Concern  . None   Social History  Narrative  . None   No past surgical history on file. Past Medical History  Diagnosis Date  . Chronic pain syndrome   . Postlaminectomy syndrome, cervical region   . 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 Thoracic radiculopathy   . Lumbago   . Sciatica   . Primary localized osteoarthrosis, lower leg   . Huntington's chorea   . Pain in joint, lower leg    Ht 6\' 3"  (1.905 m)  Wt 241 lb (109.317 kg)  BMI 30.12 kg/m2  BP 119/70  P 96  O2 sat 97%   Review of Systems  HENT: Positive for neck pain.   Musculoskeletal: Positive for back pain.       Spasms  Neurological: Positive for numbness.       Tingling  Psychiatric/Behavioral: Positive for dysphoric mood. The patient is nervous/anxious.   All other systems reviewed and are negative.       Objective:   Physical Exam  Nursing note and vitals reviewed. Constitutional: He is oriented to person, place, and time. He appears well-developed and well-nourished.  HENT:  Head: Normocephalic and atraumatic.  Eyes: Conjunctivae and EOM are normal. Pupils are equal, round, and reactive to light.  Neurological: He is alert and oriented to person, place, and time. He has normal strength. No sensory deficit. Gait normal.  Reflex Scores:      Tricep reflexes are 2+ on the right  side and 2+ on the left side.      Bicep reflexes are 2+ on the right side and 2+ on the left side.      Brachioradialis reflexes are 2+ on the right side and 2+ on the left side.      Patellar reflexes are 2+ on the right side and 2+ on the left side.      Achilles reflexes are 2+ on the right side and 2+ on the left side. Psychiatric: He has a normal mood and affect.          Assessment & Plan:  1. Cervical postlaminectomy syndrome, Hx of ACDF C4-5, C6-7, C7-T1, 02/2008.  2. Spondylosis of L-spine  3. Chronic postoperative pain we'll continue current medications which consist of Opana ER 10 mg twice a day and oxycodone 5 mg up to  twice a day. Should continue his walking program and stretching program, also advised patient to keep a good posture,No signs of aberrant drug behavior. PA followup visit in one month continue random urine drug screens and pill count  Patient denies any constipation. No other side effects of medications. Weight has been going down. UDS consistent in March 2014

## 2013-06-23 NOTE — Patient Instructions (Signed)
Next visit with PA

## 2013-07-21 ENCOUNTER — Ambulatory Visit: Payer: BC Managed Care – PPO | Admitting: Physical Medicine & Rehabilitation

## 2013-07-21 ENCOUNTER — Encounter
Payer: BC Managed Care – PPO | Attending: Physical Medicine and Rehabilitation | Admitting: Physical Medicine and Rehabilitation

## 2013-07-21 ENCOUNTER — Encounter: Payer: Self-pay | Admitting: Physical Medicine and Rehabilitation

## 2013-07-21 VITALS — BP 133/71 | HR 91 | Resp 14 | Ht 75.0 in | Wt 238.8 lb

## 2013-07-21 DIAGNOSIS — Z981 Arthrodesis status: Secondary | ICD-10-CM | POA: Insufficient documentation

## 2013-07-21 DIAGNOSIS — M47817 Spondylosis without myelopathy or radiculopathy, lumbosacral region: Secondary | ICD-10-CM

## 2013-07-21 DIAGNOSIS — G8928 Other chronic postprocedural pain: Secondary | ICD-10-CM | POA: Insufficient documentation

## 2013-07-21 DIAGNOSIS — M961 Postlaminectomy syndrome, not elsewhere classified: Secondary | ICD-10-CM | POA: Insufficient documentation

## 2013-07-21 MED ORDER — OXYCODONE-ACETAMINOPHEN 5-325 MG PO TABS: 1.0000 | ORAL_TABLET | Freq: Two times a day (BID) | ORAL | Status: DC

## 2013-07-21 MED ORDER — OXYMORPHONE HCL ER 10 MG PO TB12: 10.0000 mg | ORAL_TABLET | Freq: Two times a day (BID) | ORAL | Status: DC

## 2013-07-21 NOTE — Progress Notes (Signed)
Subjective:    Patient ID: David [name], male    DOB: 09/17/1976, 37 y.o.   MRN: [id number]  HPI The patient complains about chronicneck pain which radiates into his left arm and left chest. The patient also complains about numbness and tingling in the same areas. The patient also complains about LBP, which radiates into both posterior legs.He also complains about radiating numbness and tingling in the same distribution. Hx of ACDF C4-5, C6-7, C7-T1,02/2008  The problem has been stable. The patient states, that he walks more and has lost more weight, he feels pretty well overall.   Pain Inventory Average Pain 5 Pain Right Now 6 My pain is sharp, burning, dull, stabbing, tingling and aching  In the last 24 hours, has pain interfered with the following? General activity 6 Relation with others 5 Enjoyment of life 6 What TIME of day is your pain at its worst? all Sleep (in general) Fair  Pain is worse with: walking, bending, sitting, inactivity, standing and some activites Pain improves with: rest, heat/ice, pacing activities and medication Relief from Meds: 5  Mobility walk without assistance how many minutes can you walk? 60 ability to climb steps?  yes do you drive?  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  no  Physicians involved in your care Any changes since last visit?  no   Family History  Problem Relation Age of Onset  . Arthritis Mother   . Heart disease Mother   . Diabetes Mother   . Heart disease Father   . Diabetes Father    History   Social History  . Marital Status: Married    Spouse Name: N/A    Number of Children: N/A  . Years of Education: N/A   Social History Main Topics  . Smoking status: Never Smoker   . Smokeless tobacco: Never Used  . Alcohol Use: No  . Drug Use: No  . Sexual Activity: None   Other Topics Concern  . None    Social History Narrative  . None   History reviewed. No pertinent past surgical history. Past Medical History  Diagnosis Date  . Chronic pain syndrome   . Postlaminectomy syndrome, cervical region   . 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 Thoracic radiculopathy   . Lumbago   . Sciatica   . Primary localized osteoarthrosis, lower leg   . Huntington's chorea   . Pain in joint, lower leg    BP 133/71  Pulse 91  Resp 14  Ht 6\' 3"  (1.905 m)  Wt 238 lb 12.8 oz (108.319 kg)  BMI 29.85 kg/m2  SpO2 98%    Review of Systems  Musculoskeletal: Positive for back pain.       Spasms  Neurological:       Tingling  Psychiatric/Behavioral: The patient is nervous/anxious.   All other systems reviewed and are negative.       Objective:   Physical Exam Constitutional: He is oriented to person, place, and time. He appears well-developed and well-nourished.  HENT:  Head: Normocephalic.  Musculoskeletal: He exhibits tenderness.  Neurological: He is alert and oriented to person, place, and time.  Skin: Skin is warm and dry.  Psychiatric: He has a normal mood and affect.  Symmetric normal motor tone is noted throughout. Normal muscle bulk. Muscle testing reveals 5/5 muscle strength of the upper extremity, and  5/5 of the lower extremity. Full range of motion in upper and lower extremities. ROM of spine is restricted, C-spine mainly into rotation, about 40-45 degrees, worse to the left. Fine motor movements are normal in both hands.  DTR in the upper and lower extremity are present and symmetric 1+. No clonus is noted.  Patient arises from chair without difficulty. Narrow based gait with normal arm swing bilateral , able to walk on heels and toes . Tandem walk is stable. No pronator drift. Rhomberg negative.        Assessment & Plan:  1. Cervical postlaminectomy syndrome, Hx of ACDF C4-5, C6-7, C7-T1, 02/2008.  2. Spondylosis of L-spine  3. Chronic  postoperative pain we'll continue current medications which consist of Opana ER 10 mg twice a day and oxycodone 5 mg up to twice a day. Should continue his walking program and stretching program, also advised patient to keep a good posture, showed patient some tricks to do that. Offered patient to prescribe PT mainly for pain relief and relaxation of muscles, patient states, that he is doing exercises on his own right now, but might consider it if his pain gets worse.  Advised patient to continue with his walking program, and eating a healthy diet, the patient is still loosing weight. No signs of aberrant drug behavior. PA followup visit in one month continue random urine drug screens and pill count  UDS consistent in August 2014

## 2013-07-21 NOTE — Patient Instructions (Signed)
Continue with your walking program. 

## 2013-08-18 ENCOUNTER — Encounter: Payer: BC Managed Care – PPO | Attending: Physical Medicine and Rehabilitation

## 2013-08-18 ENCOUNTER — Ambulatory Visit (HOSPITAL_BASED_OUTPATIENT_CLINIC_OR_DEPARTMENT_OTHER): Payer: BC Managed Care – PPO | Admitting: Physical Medicine & Rehabilitation

## 2013-08-18 ENCOUNTER — Encounter: Payer: Self-pay | Admitting: Physical Medicine & Rehabilitation

## 2013-08-18 VITALS — BP 113/70 | HR 100 | Resp 14 | Ht 75.0 in | Wt 238.6 lb

## 2013-08-18 DIAGNOSIS — M961 Postlaminectomy syndrome, not elsewhere classified: Secondary | ICD-10-CM | POA: Insufficient documentation

## 2013-08-18 DIAGNOSIS — M79609 Pain in unspecified limb: Secondary | ICD-10-CM | POA: Insufficient documentation

## 2013-08-18 DIAGNOSIS — M545 Low back pain, unspecified: Secondary | ICD-10-CM

## 2013-08-18 DIAGNOSIS — M542 Cervicalgia: Secondary | ICD-10-CM | POA: Insufficient documentation

## 2013-08-18 DIAGNOSIS — R209 Unspecified disturbances of skin sensation: Secondary | ICD-10-CM | POA: Insufficient documentation

## 2013-08-18 DIAGNOSIS — M47817 Spondylosis without myelopathy or radiculopathy, lumbosacral region: Secondary | ICD-10-CM | POA: Insufficient documentation

## 2013-08-18 DIAGNOSIS — Z981 Arthrodesis status: Secondary | ICD-10-CM | POA: Insufficient documentation

## 2013-08-18 DIAGNOSIS — G8928 Other chronic postprocedural pain: Secondary | ICD-10-CM

## 2013-08-18 DIAGNOSIS — R079 Chest pain, unspecified: Secondary | ICD-10-CM | POA: Insufficient documentation

## 2013-08-18 MED ORDER — OXYCODONE-ACETAMINOPHEN 5-325 MG PO TABS: 1.0000 | ORAL_TABLET | Freq: Two times a day (BID) | ORAL | Status: DC

## 2013-08-18 MED ORDER — OXYMORPHONE HCL ER 10 MG PO TB12: 10.0000 mg | ORAL_TABLET | Freq: Two times a day (BID) | ORAL | Status: DC

## 2013-08-18 NOTE — Patient Instructions (Signed)
Continue walking program Continue current medications Followup with Clydie Braun next month

## 2013-08-18 NOTE — Progress Notes (Signed)
Subjective:    Patient ID: David [name], male    DOB: [birth year], 37 y.o.   MRN: [id number]  HPI The patient complains about chronicneck pain which radiates into his left arm and left chest. The patient also complains about numbness and tingling in the same areas. The patient also complains about LBP, which radiates into both posterior legs.He also complains about radiating numbness and tingling in the same distribution. Hx of ACDF C4-5, C6-7, C7-T1,02/2008  The problem has been stable. No constipation  Increased walking 8-10 blocks twice a day. Pain Inventory Average Pain 5 Pain Right Now 5 My pain is intermittent, constant, sharp, burning, dull, stabbing, tingling and aching  In the last 24 hours, has pain interfered with the following? General activity 5 Relation with others 6 Enjoyment of life 6 What TIME of day is your pain at its worst? all Sleep (in general) Fair  Pain is worse with: walking, bending, sitting, inactivity, standing and some activites Pain improves with: rest, heat/ice, pacing activities and medication Relief from Meds: 5  Mobility walk without assistance how many minutes can you walk? 60 ability to climb steps?  yes do you drive?  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  no  Physicians involved in your care Any changes since last visit?  no   Family History  Problem Relation Age of Onset  . Arthritis Mother   . Heart disease Mother   . Diabetes Mother   . Heart disease Father   . Diabetes Father    History   Social History  . Marital Status: Married    Spouse Name: N/A    Number of Children: N/A  . Years of Education: N/A   Social History Main Topics  . Smoking status: Never Smoker   . Smokeless tobacco: Never Used  . Alcohol Use: No  . Drug Use: No  . Sexual Activity: None   Other Topics Concern  . None    Social History Narrative  . None   History reviewed. No pertinent past surgical history. Past Medical History  Diagnosis Date  . Chronic pain syndrome   . Postlaminectomy syndrome, cervical region   . 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 Thoracic radiculopathy   . Lumbago   . Sciatica   . Primary localized osteoarthrosis, lower leg   . Huntington's chorea   . Pain in joint, lower leg    BP 113/70  Pulse 100  Resp 14  Ht 6\' 3"  (1.905 m)  Wt 238 lb 9.6 oz (108.228 kg)  BMI 29.82 kg/m2  SpO2 97%    Review of Systems  Musculoskeletal:       Spasm  Neurological:       Tingling  Psychiatric/Behavioral: Positive for dysphoric mood. The patient is nervous/anxious.   All other systems reviewed and are negative.       Objective:   Physical Exam  Nursing note and vitals reviewed.  Constitutional: He is oriented to person, place, and time. He appears well-developed and well-nourished.  HENT:  Head: Normocephalic and atraumatic.  Eyes: Conjunctivae and EOM are normal. Pupils are equal, round, and reactive to light.  Neurological: He is alert and oriented to person, place, and time. He has normal strength. No sensory deficit. Gait normal.  Cervical spine range of motion full to the right mildly reduced to the left. 75%  range extension and flexion   Motor strength is 5/5 bilateral deltoid, bicep, tricep grip   Psychiatric: He has a normal mood and affect.        Assessment & Plan:   1. Cervical postlaminectomy syndrome, Hx of ACDF C4-5, C6-7, C7-T1, 02/2008.   2. Spondylosis of L-spine   3. Chronic postoperative pain we'll continue current medications which consist of Opana ER 10 mg twice a day and oxycodone 5 mg up to twice a day. Should continue his walking program and stretching program, also advised patient to keep a good posture,No signs of aberrant drug behavior. PA followup visit in one month continue random urine drug screens and  pill count   Patient denies any constipation. No other side effects of medications. Weight has been going down. UDS consistent 06/23/2013

## 2013-09-20 ENCOUNTER — Encounter: Payer: Self-pay | Admitting: Physical Medicine and Rehabilitation

## 2013-09-20 ENCOUNTER — Encounter
Payer: BC Managed Care – PPO | Attending: Physical Medicine and Rehabilitation | Admitting: Physical Medicine and Rehabilitation

## 2013-09-20 VITALS — BP 134/69 | HR 99 | Resp 14 | Ht 75.0 in | Wt 238.2 lb

## 2013-09-20 DIAGNOSIS — M47817 Spondylosis without myelopathy or radiculopathy, lumbosacral region: Secondary | ICD-10-CM | POA: Insufficient documentation

## 2013-09-20 DIAGNOSIS — Z981 Arthrodesis status: Secondary | ICD-10-CM | POA: Insufficient documentation

## 2013-09-20 DIAGNOSIS — G8929 Other chronic pain: Secondary | ICD-10-CM

## 2013-09-20 DIAGNOSIS — G8918 Other acute postprocedural pain: Secondary | ICD-10-CM | POA: Insufficient documentation

## 2013-09-20 DIAGNOSIS — M961 Postlaminectomy syndrome, not elsewhere classified: Secondary | ICD-10-CM

## 2013-09-20 DIAGNOSIS — M545 Low back pain: Secondary | ICD-10-CM

## 2013-09-20 MED ORDER — OXYMORPHONE HCL ER 10 MG PO TB12: 10.0000 mg | ORAL_TABLET | Freq: Two times a day (BID) | ORAL | Status: DC

## 2013-09-20 MED ORDER — OXYCODONE-ACETAMINOPHEN 5-325 MG PO TABS: 1.0000 | ORAL_TABLET | Freq: Two times a day (BID) | ORAL | Status: DC

## 2013-09-20 MED ORDER — DICLOFENAC EPOLAMINE 1.3 % TD PTCH: 1.0000 | MEDICATED_PATCH | Freq: Two times a day (BID) | TRANSDERMAL | Status: DC

## 2013-09-20 NOTE — Patient Instructions (Signed)
Try to relax some, and continue with your walking

## 2013-09-20 NOTE — Progress Notes (Signed)
Subjective:    Patient ID: David [name], male    DOB: 15-Jun-1976, 37 y.o.   MRN: [id number]  HPI The patient complains about chronicneck pain which radiates into his left arm and left chest. The patient also complains about numbness and tingling in the same areas. The patient also complains about LBP, which radiates into both posterior legs.He also complains about radiating numbness and tingling in the same distribution. Hx of ACDF C4-5, C6-7, C7-T1,02/2008  The problem has gotten a little worse with the colder weather and having more personal stress the last weeks. He states, that he has more aching / arthritic pain in his low back.  The patient states, that he still tries to walk, but has difficulties to find the time.   Pain Inventory Average Pain 5 Pain Right Now 7 My pain is intermittent, constant, sharp, burning, dull, stabbing, tingling, aching and other  In the last 24 hours, has pain interfered with the following? General activity 6 Relation with others 6 Enjoyment of life 7 What TIME of day is your pain at its worst? all day Sleep (in general) Fair  Pain is worse with: walking, bending, sitting, inactivity, standing, unsure and some activites Pain improves with: rest, heat/ice, therapy/exercise, pacing activities and medication Relief from Meds: 5  Mobility walk without assistance how many minutes can you walk? 60 ability to climb steps?  yes do you drive?  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  no  Physicians involved in your care Any changes since last visit?  no   Family History  Problem Relation Age of Onset  . Arthritis Mother   . Heart disease Mother   . Diabetes Mother   . Heart disease Father   . Diabetes Father    History   Social History  . Marital Status: Married    Spouse Name: N/A    Number of Children:  N/A  . Years of Education: N/A   Social History Main Topics  . Smoking status: Never Smoker   . Smokeless tobacco: Never Used  . Alcohol Use: No  . Drug Use: No  . Sexual Activity: None   Other Topics Concern  . None   Social History Narrative  . None   History reviewed. No pertinent past surgical history. Past Medical History  Diagnosis Date  . Chronic pain syndrome   . Postlaminectomy syndrome, cervical region   . 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 Thoracic radiculopathy   . Lumbago   . Sciatica   . Primary localized osteoarthrosis, lower leg   . Huntington's chorea   . Pain in joint, lower leg    BP 134/69  Pulse 99  Resp 14  Ht 6\' 3"  (1.905 m)  Wt 238 lb 3.2 oz (108.047 kg)  BMI 29.77 kg/m2  SpO2 100%      Review of Systems  Musculoskeletal: Positive for back pain.  Neurological: Positive for numbness.       Spasms, tingling  Psychiatric/Behavioral: Positive for dysphoric mood. The patient is nervous/anxious.        Objective:   Physical Exam Constitutional: He is oriented to person, place, and time. He appears well-developed and well-nourished.  HENT:  Head: Normocephalic.  Musculoskeletal: He exhibits tenderness.  Neurological: He is alert and oriented to person, place, and time.  Skin: Skin is warm and dry.  Psychiatric: He has a normal mood and affect.  Symmetric normal motor tone is noted throughout. Normal muscle bulk. Muscle testing reveals 5/5 muscle strength of the upper extremity, and 5/5 of the lower extremity. Full range of motion in upper and lower extremities. ROM of spine is restricted, C-spine mainly into rotation, about 40-45 degrees, worse to the left. Fine motor movements are normal in both hands.  DTR in the upper and lower extremity are present and symmetric 1+. No clonus is noted.  Patient arises from chair without difficulty. Narrow based gait with normal arm swing bilateral , able to walk on heels  and toes . Tandem walk is stable. No pronator drift. Rhomberg negative.        Assessment & Plan:  1. Cervical postlaminectomy syndrome, Hx of ACDF C4-5, C6-7, C7-T1, 02/2008.  2. Spondylosis of L-spine , increased aching pain, prescribed Flector patches to help with this exacerbation of his LBP 3. Chronic postoperative pain we'll continue current medications which consist of Opana ER 10 mg twice a day and oxycodone 5 mg up to twice a day. Refilled those medications. Should continue his walking program and stretching program, also advised patient to keep a good posture, showed patient some tricks to do that. Offered patient to prescribe PT mainly for pain relief and relaxation of muscles, patient states, that he is doing exercises on his own right now, but might consider it if his pain gets worse.  Advised patient to continue with his walking program. No signs of aberrant drug behavior. PA followup visit in one month continue random urine drug screens and pill count  UDS consistent in August 2014

## 2013-10-17 ENCOUNTER — Other Ambulatory Visit: Payer: Self-pay | Admitting: *Deleted

## 2013-10-17 MED ORDER — OXYCODONE-ACETAMINOPHEN 5-325 MG PO TABS: 1.0000 | ORAL_TABLET | Freq: Two times a day (BID) | ORAL | Status: DC

## 2013-10-17 MED ORDER — OXYMORPHONE HCL ER 10 MG PO TB12: 10.0000 mg | ORAL_TABLET | Freq: Two times a day (BID) | ORAL | Status: DC

## 2013-10-17 NOTE — Telephone Encounter (Signed)
RX printed early for controlled medication for the visit with RN on 10/20/13 (to be signed by MD) 

## 2013-10-20 ENCOUNTER — Encounter: Payer: BC Managed Care – PPO | Attending: Physical Medicine & Rehabilitation | Admitting: *Deleted

## 2013-10-20 ENCOUNTER — Encounter: Payer: Self-pay | Admitting: *Deleted

## 2013-10-20 VITALS — BP 119/63 | HR 104 | Resp 14

## 2013-10-20 DIAGNOSIS — G8918 Other acute postprocedural pain: Secondary | ICD-10-CM | POA: Insufficient documentation

## 2013-10-20 DIAGNOSIS — M961 Postlaminectomy syndrome, not elsewhere classified: Secondary | ICD-10-CM

## 2013-10-20 DIAGNOSIS — M545 Low back pain: Secondary | ICD-10-CM

## 2013-10-20 DIAGNOSIS — M47817 Spondylosis without myelopathy or radiculopathy, lumbosacral region: Secondary | ICD-10-CM | POA: Insufficient documentation

## 2013-10-20 DIAGNOSIS — Z981 Arthrodesis status: Secondary | ICD-10-CM | POA: Insufficient documentation

## 2013-10-20 NOTE — Progress Notes (Signed)
Here for pill count and medication refills. Opana ER 10 mg Fill date 09/22/13 # 60   Today NV#8  Percocet 5-325 fill date 09/22/13 # 60  Today NV# 14.  Pill counts appropriate.  No changes in pain levels or medications.  Refill given and return visit for 1 month with RN for refill and pill count.

## 2013-10-20 NOTE — Patient Instructions (Signed)
Follow up one month with RN for pill count and med refill 

## 2013-11-14 ENCOUNTER — Other Ambulatory Visit: Payer: Self-pay | Admitting: *Deleted

## 2013-11-14 MED ORDER — OXYCODONE-ACETAMINOPHEN 5-325 MG PO TABS: 1.0000 | ORAL_TABLET | Freq: Two times a day (BID) | ORAL | Status: DC

## 2013-11-14 MED ORDER — OXYMORPHONE HCL ER 10 MG PO TB12: 10.0000 mg | ORAL_TABLET | Freq: Two times a day (BID) | ORAL | Status: DC

## 2013-11-14 NOTE — Telephone Encounter (Signed)
RX printed early for controlled medication for the visit with RN on 11/16/13 (to be signed by MD) 

## 2013-11-16 ENCOUNTER — Encounter: Payer: BC Managed Care – PPO | Attending: Physical Medicine & Rehabilitation | Admitting: *Deleted

## 2013-11-16 ENCOUNTER — Encounter: Payer: Self-pay | Admitting: *Deleted

## 2013-11-16 VITALS — BP 123/57 | HR 74 | Resp 14 | Wt 240.4 lb

## 2013-11-16 DIAGNOSIS — M47817 Spondylosis without myelopathy or radiculopathy, lumbosacral region: Secondary | ICD-10-CM | POA: Insufficient documentation

## 2013-11-16 DIAGNOSIS — G8928 Other chronic postprocedural pain: Secondary | ICD-10-CM | POA: Insufficient documentation

## 2013-11-16 DIAGNOSIS — M961 Postlaminectomy syndrome, not elsewhere classified: Secondary | ICD-10-CM | POA: Insufficient documentation

## 2013-11-16 DIAGNOSIS — Z79899 Other long term (current) drug therapy: Secondary | ICD-10-CM | POA: Insufficient documentation

## 2013-11-16 DIAGNOSIS — M545 Low back pain, unspecified: Secondary | ICD-10-CM

## 2013-11-16 DIAGNOSIS — Z981 Arthrodesis status: Secondary | ICD-10-CM | POA: Insufficient documentation

## 2013-11-16 NOTE — Patient Instructions (Signed)
Follow up in one month with Dr Kirsteins or Pam Love PA 

## 2013-11-16 NOTE — Progress Notes (Signed)
Here for pill count and medication refills.Opana ER 10 mg # 60  Fill date  10/20/13   Today NV#14  Percocet 5/325 # 60 Fill date  10/20/13  Today NV#  21 Pill counts are appropriate. VSS    Pain level: 6   He is in the process of moving offices and some stress at work coupled with the cold dreary weather it affects his pain levels. Follow up in one month with Dr Wynn BankerKirsteins or Marissa NestlePam Love PA

## 2013-12-21 ENCOUNTER — Encounter
Payer: BC Managed Care – PPO | Attending: Physical Medicine and Rehabilitation | Admitting: Physical Medicine and Rehabilitation

## 2013-12-21 ENCOUNTER — Encounter: Payer: Self-pay | Admitting: Physical Medicine and Rehabilitation

## 2013-12-21 VITALS — BP 124/67 | HR 104 | Resp 14 | Ht 75.0 in | Wt 241.0 lb

## 2013-12-21 DIAGNOSIS — M545 Low back pain, unspecified: Secondary | ICD-10-CM

## 2013-12-21 DIAGNOSIS — IMO0002 Reserved for concepts with insufficient information to code with codable children: Secondary | ICD-10-CM | POA: Insufficient documentation

## 2013-12-21 DIAGNOSIS — R079 Chest pain, unspecified: Secondary | ICD-10-CM | POA: Insufficient documentation

## 2013-12-21 DIAGNOSIS — M79609 Pain in unspecified limb: Secondary | ICD-10-CM | POA: Insufficient documentation

## 2013-12-21 DIAGNOSIS — M961 Postlaminectomy syndrome, not elsewhere classified: Secondary | ICD-10-CM | POA: Insufficient documentation

## 2013-12-21 DIAGNOSIS — G8929 Other chronic pain: Secondary | ICD-10-CM | POA: Insufficient documentation

## 2013-12-21 DIAGNOSIS — E119 Type 2 diabetes mellitus without complications: Secondary | ICD-10-CM | POA: Insufficient documentation

## 2013-12-21 DIAGNOSIS — M47817 Spondylosis without myelopathy or radiculopathy, lumbosacral region: Secondary | ICD-10-CM | POA: Insufficient documentation

## 2013-12-21 DIAGNOSIS — G10 Huntington's disease: Secondary | ICD-10-CM | POA: Insufficient documentation

## 2013-12-21 DIAGNOSIS — M542 Cervicalgia: Secondary | ICD-10-CM | POA: Insufficient documentation

## 2013-12-21 DIAGNOSIS — G894 Chronic pain syndrome: Secondary | ICD-10-CM | POA: Insufficient documentation

## 2013-12-21 MED ORDER — OXYCODONE-ACETAMINOPHEN 5-325 MG PO TABS: 1.0000 | ORAL_TABLET | Freq: Two times a day (BID) | ORAL | Status: DC

## 2013-12-21 MED ORDER — OXYMORPHONE HCL ER 10 MG PO TB12: 10.0000 mg | ORAL_TABLET | Freq: Two times a day (BID) | ORAL | Status: DC

## 2013-12-21 NOTE — Progress Notes (Signed)
Subjective:    Patient ID: David [name], male    DOB: Mar 27, 1976, 38 y.o.   MRN: [id number]  HPI  [name] is here for follow up on chronic neck pain radiating to left upper chest as well as LUE>RUE. He had  ACDF C4-T1 by Dr. Yetta Barre in 02/2008 and had some improvement initially. He does have disc herniations T1/T2 on the left.  He also has DDD with HNP L1-L3 and L5-S1 per work up by Dr. Yetta Barre. He continues with low back pain radiating to bilateral legs posteriorly but reports that this is better controlled at current time.    Since the New year he has maintained an exercise regimen of walking  a mile/ 4-5 times a week. He is also does stretches and is using 10 lbs weights for UB strengthening.  He has lost about 15 lbs weight in the past year but despite this reports increase in Hgb A1c as well as lipids. He does have a fatty liver--does not use any alcohol. He works full time for [employer] at [employer] most of the day.    Pain Inventory Average Pain 5 Pain Right Now 7 My pain is intermittent, constant, sharp, burning, dull, stabbing, tingling and aching  In the last 24 hours, has pain interfered with the following? General activity 7 Relation with others 6 Enjoyment of life 6 What TIME of day is your pain at its worst? all day Sleep (in general) Fair  Pain is worse with: walking, bending, sitting, inactivity, standing and some activites Pain improves with: rest, heat/ice, therapy/exercise, pacing activities and medication Relief from Meds: 5  Mobility walk without assistance how many minutes can you walk? 60 ability to climb steps?  yes do you drive?  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  no  Physicians involved in your care Any changes since last visit?  no   Family History  Problem Relation Age of  Onset  . Arthritis Mother   . Heart disease Mother   . Diabetes Mother   . Heart disease Father   . Diabetes Father    History   Social History  . Marital Status: Married    Spouse Name: N/A    Number of Children: N/A  . Years of Education: N/A   Social History Main Topics  . Smoking status: Never Smoker   . Smokeless tobacco: Never Used  . Alcohol Use: No  . Drug Use: No  . Sexual Activity: None   Other Topics Concern  . None   Social History Narrative  . None   History reviewed. No pertinent past surgical history. Past Medical History  Diagnosis Date  . Chronic pain syndrome   . Postlaminectomy syndrome, cervical region   . 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 Thoracic radiculopathy   . Lumbago   . Sciatica   . Primary localized osteoarthrosis, lower leg   . Huntington's chorea   . Pain in joint, lower leg    BP 124/67  Pulse 104  Resp 14  Ht 6\' 3"  (1.905 m)  Wt 241 lb (109.317 kg)  BMI 30.12 kg/m2  SpO2 97%  Opioid Risk Score:   Fall Risk Score: Low Fall Risk (0-5 points) (pt educated on fall risk, brochure given to pt.)    Review of Systems  Musculoskeletal: Positive  for back pain and neck pain.  Neurological: Positive for numbness.       Tingling, spasms  Psychiatric/Behavioral: Positive for dysphoric mood. The patient is nervous/anxious.   All other systems reviewed and are negative.       Objective:   Physical Exam  Nursing note and vitals reviewed. Constitutional: He is oriented to person, place, and time. He appears well-developed and well-nourished.  HENT:  Head: Normocephalic and atraumatic.  Eyes: Conjunctivae are normal. Pupils are equal, round, and reactive to light.  Neck: Normal range of motion.  Cardiovascular: Normal rate and regular rhythm.   Pulmonary/Chest: Effort normal and breath sounds normal. No respiratory distress. He has no wheezes.  Musculoskeletal:  Has good ROM cervical spine. No  paraspinal tenderness. Strength BUE/BLE 5/5. DTRs symmetric and no clonus noted.  Has decrease in ROM lumbar spine. He gets up from chair without difficulty and walks with narrow based gait. No AD.    Neurological: He is alert and oriented to person, place, and time.          Assessment & Plan:  1. Cervical postlaminectomy syndrome: Current pain regimen effective with use opana bid with breakfast and supper. He uses oxycodone with lunch and with supper.   Refilled: Opana ER 10 mg # 60---use one po bid                 Percocet 5/325 mg # 60 pills--use one po bid.   2. Spondylosis of L-spine:  Encourage to keep up current exercise routine as well as correct posture. Use flector patches bid. Continue to use pain meds as Rx.  3. Diabetes: Discussed importance of diet/exercise on  blood sugar management which would in turn prevent diabetic neuropathy as well as other complications.

## 2014-01-15 ENCOUNTER — Other Ambulatory Visit: Payer: Self-pay | Admitting: *Deleted

## 2014-01-15 MED ORDER — OXYMORPHONE HCL ER 10 MG PO TB12: 10.0000 mg | ORAL_TABLET | Freq: Two times a day (BID) | ORAL | Status: DC

## 2014-01-15 MED ORDER — OXYCODONE-ACETAMINOPHEN 5-325 MG PO TABS: 1.0000 | ORAL_TABLET | Freq: Two times a day (BID) | ORAL | Status: DC

## 2014-01-15 NOTE — Telephone Encounter (Signed)
RX printed for MD to sign for RN visit 01/19/14 

## 2014-01-19 ENCOUNTER — Encounter: Payer: BC Managed Care – PPO | Attending: Physical Medicine & Rehabilitation | Admitting: *Deleted

## 2014-01-19 ENCOUNTER — Encounter: Payer: Self-pay | Admitting: *Deleted

## 2014-01-19 VITALS — BP 123/60 | HR 102 | Resp 14

## 2014-01-19 DIAGNOSIS — G894 Chronic pain syndrome: Secondary | ICD-10-CM | POA: Insufficient documentation

## 2014-01-19 DIAGNOSIS — M545 Low back pain, unspecified: Secondary | ICD-10-CM | POA: Insufficient documentation

## 2014-01-19 DIAGNOSIS — E119 Type 2 diabetes mellitus without complications: Secondary | ICD-10-CM | POA: Insufficient documentation

## 2014-01-19 DIAGNOSIS — M47817 Spondylosis without myelopathy or radiculopathy, lumbosacral region: Secondary | ICD-10-CM | POA: Insufficient documentation

## 2014-01-19 DIAGNOSIS — M51379 Other intervertebral disc degeneration, lumbosacral region without mention of lumbar back pain or lower extremity pain: Secondary | ICD-10-CM | POA: Insufficient documentation

## 2014-01-19 DIAGNOSIS — G10 Huntington's disease: Secondary | ICD-10-CM | POA: Insufficient documentation

## 2014-01-19 DIAGNOSIS — M5137 Other intervertebral disc degeneration, lumbosacral region: Secondary | ICD-10-CM | POA: Insufficient documentation

## 2014-01-19 DIAGNOSIS — M961 Postlaminectomy syndrome, not elsewhere classified: Secondary | ICD-10-CM

## 2014-01-19 NOTE — Patient Instructions (Signed)
Follow up in one month with RN med refill an d 2 months with Kirsteins

## 2014-01-19 NOTE — Progress Notes (Signed)
Here for pill count and medication refills. Percocet 5/325  # 60 Fill date 12/21/13   Today NV# 13  Oxymorphone ER 10 mg # 60  Fill date 12/21/13 Today NV# 6  VSS    No falls and is a low fall risk.  He was educated and given a handout on fall prevention in the home at last visit.  Return to see RN for med refill next month and Dr Wynn BankerKirsteins

## 2014-02-13 ENCOUNTER — Other Ambulatory Visit: Payer: Self-pay | Admitting: *Deleted

## 2014-02-13 MED ORDER — OXYCODONE-ACETAMINOPHEN 5-325 MG PO TABS: 1.0000 | ORAL_TABLET | Freq: Two times a day (BID) | ORAL | Status: DC

## 2014-02-13 MED ORDER — OXYMORPHONE HCL ER 10 MG PO TB12: 10.0000 mg | ORAL_TABLET | Freq: Two times a day (BID) | ORAL | Status: DC

## 2014-02-13 NOTE — Telephone Encounter (Signed)
[facility] rx printed for md to sign for RN refill visit.

## 2014-02-16 ENCOUNTER — Encounter: Payer: Self-pay | Admitting: *Deleted

## 2014-02-16 ENCOUNTER — Encounter: Payer: BC Managed Care – PPO | Attending: Physical Medicine & Rehabilitation | Admitting: *Deleted

## 2014-02-16 VITALS — BP 136/59 | HR 109 | Resp 14

## 2014-02-16 DIAGNOSIS — M47817 Spondylosis without myelopathy or radiculopathy, lumbosacral region: Secondary | ICD-10-CM | POA: Insufficient documentation

## 2014-02-16 DIAGNOSIS — G894 Chronic pain syndrome: Secondary | ICD-10-CM

## 2014-02-16 DIAGNOSIS — M961 Postlaminectomy syndrome, not elsewhere classified: Secondary | ICD-10-CM | POA: Insufficient documentation

## 2014-02-16 DIAGNOSIS — M545 Low back pain, unspecified: Secondary | ICD-10-CM

## 2014-02-16 DIAGNOSIS — G8928 Other chronic postprocedural pain: Secondary | ICD-10-CM | POA: Insufficient documentation

## 2014-02-16 DIAGNOSIS — Z79899 Other long term (current) drug therapy: Secondary | ICD-10-CM | POA: Insufficient documentation

## 2014-02-16 DIAGNOSIS — Z981 Arthrodesis status: Secondary | ICD-10-CM | POA: Insufficient documentation

## 2014-02-16 NOTE — Progress Notes (Signed)
Here for pill count and medication refills. Percocet 5/325 # 60 fill date 01/19/14 today # 17  Opana ER 10 mg # 60 Fill date  01/19/14  Today # 10 VSS.  He has had no changes since last visit.  He has had no falls.  He will return in one month for follow up appt with Dr Wynn BankerKirsteins which is already scheduled for 03/16/14.

## 2014-02-16 NOTE — Patient Instructions (Signed)
Follow up 03/16/14 with Dr Wynn BankerKirsteins

## 2014-03-16 ENCOUNTER — Encounter: Payer: Self-pay | Admitting: Physical Medicine & Rehabilitation

## 2014-03-16 ENCOUNTER — Encounter: Payer: BC Managed Care – PPO | Attending: Physical Medicine & Rehabilitation

## 2014-03-16 ENCOUNTER — Ambulatory Visit (HOSPITAL_BASED_OUTPATIENT_CLINIC_OR_DEPARTMENT_OTHER): Payer: BC Managed Care – PPO | Admitting: Physical Medicine & Rehabilitation

## 2014-03-16 VITALS — BP 132/68 | HR 88 | Resp 14 | Wt 240.4 lb

## 2014-03-16 DIAGNOSIS — Z79899 Other long term (current) drug therapy: Secondary | ICD-10-CM | POA: Insufficient documentation

## 2014-03-16 DIAGNOSIS — G8928 Other chronic postprocedural pain: Secondary | ICD-10-CM | POA: Insufficient documentation

## 2014-03-16 DIAGNOSIS — M47817 Spondylosis without myelopathy or radiculopathy, lumbosacral region: Secondary | ICD-10-CM | POA: Insufficient documentation

## 2014-03-16 DIAGNOSIS — M545 Low back pain, unspecified: Secondary | ICD-10-CM

## 2014-03-16 DIAGNOSIS — M961 Postlaminectomy syndrome, not elsewhere classified: Secondary | ICD-10-CM

## 2014-03-16 DIAGNOSIS — Z981 Arthrodesis status: Secondary | ICD-10-CM | POA: Insufficient documentation

## 2014-03-16 MED ORDER — OXYMORPHONE HCL ER 10 MG PO TB12: 10.0000 mg | ORAL_TABLET | Freq: Two times a day (BID) | ORAL | Status: DC

## 2014-03-16 MED ORDER — OXYCODONE-ACETAMINOPHEN 5-325 MG PO TABS: 1.0000 | ORAL_TABLET | Freq: Two times a day (BID) | ORAL | Status: DC

## 2014-03-16 NOTE — Patient Instructions (Signed)
Cont exercise and walking programs

## 2014-03-16 NOTE — Progress Notes (Signed)
Subjective:    Patient ID: David LeedsJohn O Hoaglin, male    DOB: 09/14/1976, 38 y.o.   MRN: [id number]  HPI The patient complains about chronicneck pain which radiates into his left arm and left chest. The patient also complains about numbness and tingling in the same areas. The patient also complains about LBP, which radiates into both posterior legs.He also complains about radiating numbness and tingling in the same distribution. Hx of ACDF C4-5, C6-7, C7-T1,02/2008  The problem has been stable. No constipation  Increased walking 8-10 blocks twice a day. 20 minutes of exercise light weights, light cardio  Pain Inventory Average Pain 5 Pain Right Now 5 My pain is intermittent, constant, sharp, burning, dull, stabbing, tingling and aching  In the last 24 hours, has pain interfered with the following? General activity 5 Relation with others 5 Enjoyment of life 6 What TIME of day is your pain at its worst? all Sleep (in general) Fair  Pain is worse with: walking, bending, sitting, inactivity, standing and some activites Pain improves with: rest, heat/ice, therapy/exercise, pacing activities and medication Relief from Meds: 5  Mobility walk without assistance how many minutes can you walk? 60 ability to climb steps?  yes do you drive?  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  no  Physicians involved in your care Any changes since last visit?  no   Family History  Problem Relation Age of Onset  . Arthritis Mother   . Heart disease Mother   . Diabetes Mother   . Heart disease Father   . Diabetes Father    History   Social History  . Marital Status: Married    Spouse Name: N/A    Number of Children: N/A  . Years of Education: N/A   Social History Main Topics  . Smoking status: Never Smoker   . Smokeless tobacco: Never Used  . Alcohol  Use: No  . Drug Use: No  . Sexual Activity: None   Other Topics Concern  . None   Social History Narrative  . None   History reviewed. No pertinent past surgical history. Past Medical History  Diagnosis Date  . Chronic pain syndrome   . Postlaminectomy syndrome, cervical region   . 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 Thoracic radiculopathy   . Lumbago   . Sciatica   . Primary localized osteoarthrosis, lower leg   . Pain in joint, lower leg    BP 132/68  Pulse 88  Resp 14  Wt 240 lb 6.4 oz (109.045 kg)  SpO2 95%  Opioid Risk Score:   Fall Risk Score: Low Fall Risk (0-5 points) (educated and handout given for fall prevention in the home at previous visit) Review of Systems  Musculoskeletal:       Spasms  Neurological: Positive for numbness.       Tingling  Psychiatric/Behavioral: Positive for dysphoric mood. The patient is nervous/anxious.   All other systems reviewed and are negative.      Objective:   Physical Exam Nursing note and vitals reviewed.  Constitutional: He is oriented to person, place, and time. He appears well-developed and well-nourished.  HENT:  Head: Normocephalic and atraumatic.  Eyes: Conjunctivae and EOM are normal. Pupils are equal, round, and reactive to light.  Neurological: He is alert and oriented to person, place, and time. He has normal  strength. No sensory deficit. Gait normal.  Cervical spine range of motion full to the right mildly reduced to the left. 75% range extension and flexion  Motor strength is 5/5 bilateral deltoid, bicep, tricep grip   Feet with stocking distribution numbness to pinprick. No skin lesions.      Assessment & Plan:  1. Cervical postlaminectomy syndrome, Hx of ACDF C4-5, C6-7, C7-T1, 02/2008.  2. Spondylosis of L-spine  3. Chronic postoperative pain we'll continue current medications which consist of Opana ER 10 mg twice a day and oxycodone 5 mg up to twice a day. Should continue  his walking program and light weight lifting program, ,No signs of aberrant drug behavior. NP followup visit in one month continue random urine drug screens and pill count  Patient denies any constipation. No other side effects of medications.  Weight has been going down.  UDS consistent 06/23/2013 4. 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 He does have some low back symptoms which may be indicative of some lumbar stenosis however has not had any lumbar imaging studies. If this progresses would recommend EMG as well as lumbar MRI.

## 2014-04-20 ENCOUNTER — Encounter: Payer: BC Managed Care – PPO | Attending: Physical Medicine & Rehabilitation | Admitting: Registered Nurse

## 2014-04-20 ENCOUNTER — Encounter: Payer: Self-pay | Admitting: Registered Nurse

## 2014-04-20 VITALS — BP 140/66 | HR 60 | Resp 14 | Ht 75.0 in | Wt 241.0 lb

## 2014-04-20 DIAGNOSIS — M545 Low back pain, unspecified: Secondary | ICD-10-CM

## 2014-04-20 DIAGNOSIS — G8928 Other chronic postprocedural pain: Secondary | ICD-10-CM | POA: Insufficient documentation

## 2014-04-20 DIAGNOSIS — M961 Postlaminectomy syndrome, not elsewhere classified: Secondary | ICD-10-CM | POA: Insufficient documentation

## 2014-04-20 DIAGNOSIS — Z981 Arthrodesis status: Secondary | ICD-10-CM | POA: Insufficient documentation

## 2014-04-20 DIAGNOSIS — Z79899 Other long term (current) drug therapy: Secondary | ICD-10-CM | POA: Insufficient documentation

## 2014-04-20 DIAGNOSIS — M47817 Spondylosis without myelopathy or radiculopathy, lumbosacral region: Secondary | ICD-10-CM | POA: Insufficient documentation

## 2014-04-20 DIAGNOSIS — Z5181 Encounter for therapeutic drug level monitoring: Secondary | ICD-10-CM

## 2014-04-20 MED ORDER — OXYMORPHONE HCL ER 10 MG PO TB12: 10.0000 mg | ORAL_TABLET | Freq: Two times a day (BID) | ORAL | Status: DC

## 2014-04-20 MED ORDER — DICLOFENAC EPOLAMINE 1.3 % TD PTCH: 1.0000 | MEDICATED_PATCH | Freq: Every day | TRANSDERMAL | Status: DC

## 2014-04-20 MED ORDER — OXYCODONE-ACETAMINOPHEN 5-325 MG PO TABS: 1.0000 | ORAL_TABLET | Freq: Two times a day (BID) | ORAL | Status: DC

## 2014-04-20 NOTE — Progress Notes (Signed)
Subjective:    Patient ID: David [name], male    DOB: 1976/09/05, 38 y.o.   MRN: [id number]  HPI: [name] is a 38 year old male who returns for follow up for chronic pain and medication refill.He says his pain is located in his neck, upper and lower back. He has HX of ACDF C4-5, C6-7, T1, 0n 02/2008. He rates his pain 7. His current exercise regime is walking and performs 20 minutes of exercises at night also light weights and light cardio.   Pain Inventory Average Pain 5 Pain Right Now 7 My pain is intermittent, constant, sharp, burning, dull, stabbing, tingling and aching  In the last 24 hours, has pain interfered with the following? General activity 8 Relation with others 7 Enjoyment of life 7 What TIME of day is your pain at its worst? constant all day Sleep (in general) Fair  Pain is worse with: walking, bending, sitting, inactivity, standing and some activites Pain improves with: rest, heat/ice, therapy/exercise, pacing activities and medication Relief from Meds: 5  Mobility walk without assistance how many minutes can you walk? 60 ability to climb steps?  yes do you drive?  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  no  Physicians involved in your care Any changes since last visit?  no   Family History  Problem Relation Age of Onset  . Arthritis Mother   . Heart disease Mother   . Diabetes Mother   . Heart disease Father   . Diabetes Father    History   Social History  . Marital Status: Married    Spouse Name: N/A    Number of Children: N/A  . Years of Education: N/A   Social History Main Topics  . Smoking status: Never Smoker   . Smokeless tobacco: Never Used  . Alcohol Use: No  . Drug Use: No  . Sexual Activity: None   Other Topics Concern  . None   Social History Narrative  . None    History reviewed. No pertinent past surgical history. Past Medical History  Diagnosis Date  . Chronic pain syndrome   . Postlaminectomy syndrome, cervical region   . 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 Thoracic radiculopathy   . Lumbago   . Sciatica   . Primary localized osteoarthrosis, lower leg   . Pain in joint, lower leg    BP 140/66  Pulse 60  Resp 14  Ht 6\' 3"  (1.905 m)  Wt 241 lb (109.317 kg)  BMI 30.12 kg/m2  SpO2 99%  Opioid Risk Score:   Fall Risk Score: Low Fall Risk (0-5 points) (pt educated on fall risk, brochure given to pt previously)   Review of Systems  Musculoskeletal: Positive for back pain and neck pain.  Neurological:       Tingling Spasms   Psychiatric/Behavioral: The patient is nervous/anxious.        Depression   All other systems reviewed and are negative.      Objective:   Physical Exam  Nursing note and vitals reviewed. Constitutional: He is oriented to person, place, and time. He appears well-developed and well-nourished.  HENT:  Head: Normocephalic and atraumatic.  Neck: Normal range of motion. Neck supple.  Cervical Paraspinal Tenderness: C-3- C-5  Cardiovascular: Normal rate, regular rhythm and normal heart sounds.   Pulmonary/Chest: Effort  normal and breath sounds normal.  Musculoskeletal:  Normal Muscle Bulk and Muscle Testing Reveals: Upper Extremities: Full ROM and Muscle Strength 5/5 Spine Forward Flexion 80 degrees Back without spinal or paraspinal tenderness Lower Extremities: Right Leg Full ROM and Muscle Strength 5/5 Left Leg Flexion Produces Pain into Popliteal Fossa Arises from chair with ease Narrow Based Gait  Neurological: He is alert and oriented to person, place, and time.  Skin: Skin is warm and dry.  Psychiatric: He has a normal mood and affect.          Assessment & Plan:  1. Cervical postlaminectomy syndrome, Hx of ACDF C4-5, C6-7, C7-T1, 02/2008. Continue current  medication and alternate using heat and ice therapy. 2. Spondylosis of L-spine . Continue current treatment regime. 3. Chronic postoperative pain: Refilled: Opana 10 mg one tablet twice a day #60 and Oxycodone 5/325mg  one tablet twice a day #60 4. 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 Continue Gabapentin, Keep blood sugars under tight control. Continue to monitor.  20 minutes of face to face patient care time was spent during this visit. All questions were encouraged and answered.   F/U in 1 month

## 2014-05-17 ENCOUNTER — Encounter: Payer: Self-pay | Admitting: Registered Nurse

## 2014-05-17 ENCOUNTER — Encounter: Payer: BC Managed Care – PPO | Attending: Physical Medicine & Rehabilitation | Admitting: Registered Nurse

## 2014-05-17 VITALS — BP 108/67 | HR 84 | Resp 14 | Ht 75.0 in | Wt 241.0 lb

## 2014-05-17 DIAGNOSIS — M961 Postlaminectomy syndrome, not elsewhere classified: Secondary | ICD-10-CM | POA: Insufficient documentation

## 2014-05-17 DIAGNOSIS — Z5181 Encounter for therapeutic drug level monitoring: Secondary | ICD-10-CM

## 2014-05-17 DIAGNOSIS — Z79899 Other long term (current) drug therapy: Secondary | ICD-10-CM | POA: Insufficient documentation

## 2014-05-17 DIAGNOSIS — M545 Low back pain, unspecified: Secondary | ICD-10-CM

## 2014-05-17 DIAGNOSIS — M47817 Spondylosis without myelopathy or radiculopathy, lumbosacral region: Secondary | ICD-10-CM | POA: Insufficient documentation

## 2014-05-17 DIAGNOSIS — G8928 Other chronic postprocedural pain: Secondary | ICD-10-CM | POA: Insufficient documentation

## 2014-05-17 DIAGNOSIS — Z981 Arthrodesis status: Secondary | ICD-10-CM | POA: Insufficient documentation

## 2014-05-17 MED ORDER — OXYMORPHONE HCL ER 10 MG PO TB12: 10.0000 mg | ORAL_TABLET | Freq: Two times a day (BID) | ORAL | Status: DC

## 2014-05-17 MED ORDER — OXYCODONE-ACETAMINOPHEN 5-325 MG PO TABS: 1.0000 | ORAL_TABLET | Freq: Two times a day (BID) | ORAL | Status: DC

## 2014-05-17 NOTE — Progress Notes (Signed)
Subjective:    Patient ID: David [name], male    DOB: May 30, 1976, 38 y.o.   MRN: [id number]  HPI: David [name] is a 38 year old male who returns for follow up for chronic pain and medication refill.He says his pain is located in his neck, upper and lower back. He has HX of ACDF C4-5, C6-7, T1, 0n 02/2008. He rates his pain 6. His current exercise regime is walking and performs 20 minutes of exercises at night also light weights and light cardio. Also incorporating the stairs at work into his routine.  Pain Inventory Average Pain 5 Pain Right Now 6 My pain is intermittent, constant, sharp, burning, dull, stabbing, tingling and aching  In the last 24 hours, has pain interfered with the following? General activity 6 Relation with others 6 Enjoyment of life 7 What TIME of day is your pain at its worst? constant Sleep (in general) Fair  Pain is worse with: walking, bending, sitting, inactivity, standing and some activites Pain improves with: rest, heat/ice, therapy/exercise, pacing activities and medication Relief from Meds: 5  Mobility walk without assistance how many minutes can you walk? 60 ability to climb steps?  yes do you drive?  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  no  Physicians involved in your care [facility]   Family History  Problem Relation Age of Onset  . Arthritis Mother   . Heart disease Mother   . Diabetes Mother   . Heart disease Father   . Diabetes Father    History   Social History  . Marital Status: Married    Spouse Name: N/A    Number of Children: N/A  . Years of Education: N/A   Social History Main Topics  . Smoking status: Never Smoker   . Smokeless tobacco: Never Used  . Alcohol Use: No  . Drug Use: No  . Sexual Activity: None   Other Topics Concern  . None   Social History Narrative  . None   History  reviewed. No pertinent past surgical history. Past Medical History  Diagnosis Date  . Chronic pain syndrome   . Postlaminectomy syndrome, cervical region   . 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 Thoracic radiculopathy   . Lumbago   . Sciatica   . Primary localized osteoarthrosis, lower leg   . Pain in joint, lower leg    BP 108/67  Pulse 84  Resp 14  Ht 6\' 3"  (1.905 m)  Wt 241 lb (109.317 kg)  BMI 30.12 kg/m2  SpO2 100%  Opioid Risk Score:   Fall Risk Score: Low Fall Risk (0-5 points) (patient educated handout declined)   Review of Systems  Musculoskeletal: Positive for back pain and neck pain.  Neurological:       Tingling, spasm  Psychiatric/Behavioral: Positive for dysphoric mood. The patient is nervous/anxious.   All other systems reviewed and are negative.      Objective:   Physical Exam  Nursing note and vitals reviewed. Constitutional: He appears well-developed and well-nourished.  HENT:  Head: Normocephalic and atraumatic.  Neck: Normal range of motion. Neck supple.  Cervical Paraspinal Tenderness: C-3- C-5  Musculoskeletal:  Normal Muscle Bulk and Muscle testing reveals: Upper Extremities: Full ROM and Muscle Strength 5/5 Thoracic Tightness Noted Lower Extremities: Full ROM and Muscle strength 5/5 Arises from chair with ease Narrow Based gait  Assessment & Plan:  1. Cervical postlaminectomy syndrome, Hx of ACDF C4-5, C6-7, C7-T1, 02/2008. Continue current medication and alternate using heat and ice therapy.  2. Spondylosis of L-spine . Continue current treatment regime.  3. Chronic postoperative pain:  Refilled: Opana 10 mg one tablet twice a day #60 and Oxycodone 5/325mg  one tablet twice a day #60  4. 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 Continue Gabapentin, Keep blood sugars under tight control. Continue to monitor.   20 minutes of face to face patient care time was spent during this visit. All questions were  encouraged and answered.   F/U in 1 month

## 2014-06-20 ENCOUNTER — Encounter: Payer: Self-pay | Admitting: Registered Nurse

## 2014-06-20 ENCOUNTER — Encounter: Payer: BC Managed Care – PPO | Attending: Physical Medicine & Rehabilitation | Admitting: Registered Nurse

## 2014-06-20 VITALS — BP 133/52 | HR 91 | Resp 14 | Wt 238.2 lb

## 2014-06-20 DIAGNOSIS — Z981 Arthrodesis status: Secondary | ICD-10-CM | POA: Insufficient documentation

## 2014-06-20 DIAGNOSIS — Z79899 Other long term (current) drug therapy: Secondary | ICD-10-CM | POA: Diagnosis not present

## 2014-06-20 DIAGNOSIS — Z5181 Encounter for therapeutic drug level monitoring: Secondary | ICD-10-CM

## 2014-06-20 DIAGNOSIS — M545 Low back pain, unspecified: Secondary | ICD-10-CM

## 2014-06-20 DIAGNOSIS — M961 Postlaminectomy syndrome, not elsewhere classified: Secondary | ICD-10-CM | POA: Diagnosis present

## 2014-06-20 DIAGNOSIS — M47817 Spondylosis without myelopathy or radiculopathy, lumbosacral region: Secondary | ICD-10-CM | POA: Diagnosis not present

## 2014-06-20 DIAGNOSIS — G8928 Other chronic postprocedural pain: Secondary | ICD-10-CM | POA: Diagnosis not present

## 2014-06-20 MED ORDER — OXYCODONE-ACETAMINOPHEN 5-325 MG PO TABS: 1.0000 | ORAL_TABLET | Freq: Two times a day (BID) | ORAL | Status: DC

## 2014-06-20 MED ORDER — OXYMORPHONE HCL ER 10 MG PO TB12: 10.0000 mg | ORAL_TABLET | Freq: Two times a day (BID) | ORAL | Status: DC

## 2014-06-20 NOTE — Progress Notes (Signed)
Subjective:    Patient ID: David LeedsJohn O [name], male    DOB: 1976/10/19, 38 y.o.   MRN: [id number]  HPI: [name] is a 38 year old male who returns for follow up for chronic pain and medication refill.He says his pain is located in his lower back, bilateral hips and bilateral knees.  He rates his pain 5. His current exercise regime is walking, lifting light weights and light cardio.  He still working about 70 hours a week. He says 50 hours at the office and the rest at home.  Pain Inventory Average Pain 6 Pain Right Now 5 My pain is constant, sharp, burning, dull, stabbing, tingling and aching  In the last 24 hours, has pain interfered with the following? General activity 5 Relation with others 7 Enjoyment of life 6 What TIME of day is your pain at its worst? all Sleep (in general) Fair  Pain is worse with: bending, sitting, inactivity, standing and some activites Pain improves with: rest, heat/ice, therapy/exercise, pacing activities and medication Relief from Meds: 5  Mobility walk without assistance how many minutes can you walk? 60 ability to climb steps?  yes do you drive?  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  no  Physicians involved in your care Any changes since last visit?  no   Family History  Problem Relation Age of Onset  . Arthritis Mother   . Heart disease Mother   . Diabetes Mother   . Heart disease Father   . Diabetes Father    History   Social History  . Marital Status: Married    Spouse Name: N/A    Number of Children: N/A  . Years of Education: N/A   Social History Main Topics  . Smoking status: Never Smoker   . Smokeless tobacco: Never Used  . Alcohol Use: No  . Drug Use: No  . Sexual Activity: None   Other Topics Concern  . None   Social History Narrative  . None   History reviewed. No pertinent  past surgical history. Past Medical History  Diagnosis Date  . Chronic pain syndrome   . Postlaminectomy syndrome, cervical region   . 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 Thoracic radiculopathy   . Lumbago   . Sciatica   . Primary localized osteoarthrosis, lower leg   . Pain in joint, lower leg    BP 133/52  Pulse 91  Resp 14  Wt 238 lb 3.2 oz (108.047 kg)  SpO2 99%  Opioid Risk Score:   Fall Risk Score: Low Fall Risk (0-5 points) (previously educated and given handout) Review of Systems  Musculoskeletal: Positive for back pain and neck pain.       Spasms  Neurological: Positive for numbness.  Psychiatric/Behavioral: Positive for dysphoric mood. The patient is nervous/anxious.   All other systems reviewed and are negative.      Objective:   Physical Exam  Nursing note and vitals reviewed. Constitutional: He is oriented to person, place, and time. He appears well-developed and well-nourished.  HENT:  Head: Normocephalic and atraumatic.  Neck: Normal range of motion. Neck supple.  Cardiovascular: Normal rate and regular rhythm.   Pulmonary/Chest: Effort normal and breath sounds normal.  Musculoskeletal:  Normal Muscle Bulk and Muscle Testing Reveals: Upper Extremities: Full ROM and Muscle strength 5/5 Back without spinal or Paraspinal  Tenderness Lower extremities: Full ROM and Muscle strength 5/5 Arises from chair with ease Narrow based gait  Neurological: He is alert and oriented to person, place, and time.  Skin: Skin is warm and dry.  Psychiatric: He has a normal mood and affect.          Assessment & Plan:  1. Cervical postlaminectomy syndrome, Hx of ACDF C4-5, C6-7, C7-T1, 02/2008. Continue current medication and alternate using heat and ice therapy.  2. Spondylosis of L-spine . Continue current treatment regime.  3. Chronic postoperative pain:  Refilled: Opana 10 mg one tablet twice a day #60 and Oxycodone 5/325mg  one tablet  twice a day #60  4. 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 Continue Gabapentin, Keep blood sugars under tight control. Continue to monitor.   15 minutes of face to face patient care time was spent during this visit. All questions were encouraged and answered.   F/U in 1 month

## 2014-06-21 ENCOUNTER — Encounter: Payer: Self-pay | Admitting: Physical Medicine & Rehabilitation

## 2014-07-17 ENCOUNTER — Encounter: Payer: BC Managed Care – PPO | Attending: Physical Medicine & Rehabilitation | Admitting: Registered Nurse

## 2014-07-17 ENCOUNTER — Encounter: Payer: Self-pay | Admitting: Registered Nurse

## 2014-07-17 VITALS — BP 121/59 | HR 86 | Resp 14 | Wt 233.4 lb

## 2014-07-17 DIAGNOSIS — Z79899 Other long term (current) drug therapy: Secondary | ICD-10-CM | POA: Diagnosis not present

## 2014-07-17 DIAGNOSIS — G8928 Other chronic postprocedural pain: Secondary | ICD-10-CM | POA: Diagnosis not present

## 2014-07-17 DIAGNOSIS — Z981 Arthrodesis status: Secondary | ICD-10-CM | POA: Diagnosis not present

## 2014-07-17 DIAGNOSIS — M47817 Spondylosis without myelopathy or radiculopathy, lumbosacral region: Secondary | ICD-10-CM | POA: Insufficient documentation

## 2014-07-17 DIAGNOSIS — M961 Postlaminectomy syndrome, not elsewhere classified: Secondary | ICD-10-CM | POA: Diagnosis not present

## 2014-07-17 DIAGNOSIS — M545 Low back pain, unspecified: Secondary | ICD-10-CM

## 2014-07-17 DIAGNOSIS — Z5181 Encounter for therapeutic drug level monitoring: Secondary | ICD-10-CM

## 2014-07-17 MED ORDER — OXYMORPHONE HCL ER 10 MG PO TB12: 10.0000 mg | ORAL_TABLET | Freq: Two times a day (BID) | ORAL | Status: DC

## 2014-07-17 MED ORDER — OXYCODONE-ACETAMINOPHEN 5-325 MG PO TABS: 1.0000 | ORAL_TABLET | Freq: Two times a day (BID) | ORAL | Status: DC

## 2014-07-17 NOTE — Progress Notes (Signed)
Subjective:    Patient ID: David [name], male    DOB: 1976-10-27, 38 y.o.   MRN: [id number]  HPI: David [name] is a 38 year old male who returns for follow up for chronic pain and medication refill.He says his pain is located in his neck, lower back, bilateral hips and bilateral knees. He rates his pain 5. His current exercise regime is walking, lifting light weights and light cardio.  He still working about 70 hours a week.   Pain Inventory Average Pain 5 Pain Right Now 5 My pain is intermittent, constant, sharp, burning, dull, stabbing, tingling and aching  In the last 24 hours, has pain interfered with the following? General activity 6 Relation with others 5 Enjoyment of life 6 What TIME of day is your pain at its worst? all Sleep (in general) Fair  Pain is worse with: walking, bending, sitting, standing and some activites Pain improves with: rest, heat/ice, therapy/exercise, pacing activities and medication Relief from Meds: 5  Mobility walk without assistance how many minutes can you walk? 60 ability to climb steps?  yes do you drive?  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  no  Physicians involved in your care Any changes since last visit?  no   Family History  Problem Relation Age of Onset  . Arthritis Mother   . Heart disease Mother   . Diabetes Mother   . Heart disease Father   . Diabetes Father    History   Social History  . Marital Status: Married    Spouse Name: N/A    Number of Children: N/A  . Years of Education: N/A   Social History Main Topics  . Smoking status: Never Smoker   . Smokeless tobacco: Never Used  . Alcohol Use: No  . Drug Use: No  . Sexual Activity: None   Other Topics Concern  . None   Social History Narrative  . None   History reviewed. No pertinent past surgical  history. Past Medical History  Diagnosis Date  . Chronic pain syndrome   . Postlaminectomy syndrome, cervical region   . 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 Thoracic radiculopathy   . Lumbago   . Sciatica   . Primary localized osteoarthrosis, lower leg   . Pain in joint, lower leg    BP 121/59  Pulse 86  Resp 14  Wt 233 lb 6.4 oz (105.87 kg)  SpO2 99%  Opioid Risk Score:   Fall Risk Score:  (previoulsy educated and given handout) Review of Systems  Musculoskeletal:       Spasms  Neurological:       Tingling  Psychiatric/Behavioral: Positive for dysphoric mood. The patient is nervous/anxious.   All other systems reviewed and are negative.      Objective:   Physical Exam  Nursing note and vitals reviewed. Constitutional: He is oriented to person, place, and time. He appears well-developed and well-nourished.  HENT:  Head: Normocephalic and atraumatic.  Neck: Normal range of motion. Neck supple.  Cardiovascular: Normal rate and regular rhythm.   Pulmonary/Chest: Effort normal and breath sounds normal.  Musculoskeletal:  Normal Muscle Bulk and Muscle Testing Reveals: Upper Extremities: Full ROM and Muscle Strength 5/5 Thoracic Paraspinal Tenderness: T-6- T-8 Lower Extremities: Full ROM and Muscle Strength 5/5 Arises from chair with ease Narrow Based Gait  Neurological: He is alert and oriented to person, place, and time.  Skin: Skin is warm and dry.  Psychiatric: He has a normal mood and affect.          Assessment & Plan:  1. Cervical postlaminectomy syndrome, Hx of ACDF C4-5, C6-7, C7-T1, 02/2008. Continue current medication and alternate using heat and ice therapy.  2. Spondylosis of L-spine . Continue current treatment regime.  3. Chronic postoperative pain:  Refilled: Opana 10 mg one tablet twice a day #60 and Oxycodone 5/325mg  one tablet twice a day #60  4. 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 Continue Gabapentin, Keep blood  sugars under tight control. Continue to monitor.   15 minutes of face to face patient care time was spent during this visit. All questions were encouraged and answered.   F/U in 1 month

## 2014-07-20 ENCOUNTER — Ambulatory Visit: Payer: BC Managed Care – PPO | Admitting: Registered Nurse

## 2014-08-16 ENCOUNTER — Encounter: Payer: Self-pay | Admitting: Registered Nurse

## 2014-08-16 ENCOUNTER — Encounter: Payer: BC Managed Care – PPO | Attending: Physical Medicine & Rehabilitation | Admitting: Registered Nurse

## 2014-08-16 VITALS — BP 111/60 | HR 66 | Resp 14 | Ht 75.0 in | Wt 232.4 lb

## 2014-08-16 DIAGNOSIS — M5134 Other intervertebral disc degeneration, thoracic region: Secondary | ICD-10-CM | POA: Insufficient documentation

## 2014-08-16 DIAGNOSIS — M961 Postlaminectomy syndrome, not elsewhere classified: Secondary | ICD-10-CM | POA: Diagnosis not present

## 2014-08-16 DIAGNOSIS — M545 Low back pain, unspecified: Secondary | ICD-10-CM

## 2014-08-16 DIAGNOSIS — M179 Osteoarthritis of knee, unspecified: Secondary | ICD-10-CM | POA: Diagnosis not present

## 2014-08-16 DIAGNOSIS — Z79899 Other long term (current) drug therapy: Secondary | ICD-10-CM

## 2014-08-16 DIAGNOSIS — E119 Type 2 diabetes mellitus without complications: Secondary | ICD-10-CM | POA: Diagnosis not present

## 2014-08-16 DIAGNOSIS — M5489 Other dorsalgia: Secondary | ICD-10-CM | POA: Insufficient documentation

## 2014-08-16 DIAGNOSIS — M47896 Other spondylosis, lumbar region: Secondary | ICD-10-CM | POA: Insufficient documentation

## 2014-08-16 DIAGNOSIS — M5430 Sciatica, unspecified side: Secondary | ICD-10-CM | POA: Insufficient documentation

## 2014-08-16 DIAGNOSIS — M5414 Radiculopathy, thoracic region: Secondary | ICD-10-CM | POA: Insufficient documentation

## 2014-08-16 DIAGNOSIS — G8929 Other chronic pain: Secondary | ICD-10-CM | POA: Diagnosis not present

## 2014-08-16 DIAGNOSIS — M5135 Other intervertebral disc degeneration, thoracolumbar region: Secondary | ICD-10-CM | POA: Insufficient documentation

## 2014-08-16 DIAGNOSIS — Z760 Encounter for issue of repeat prescription: Secondary | ICD-10-CM | POA: Diagnosis present

## 2014-08-16 DIAGNOSIS — M79669 Pain in unspecified lower leg: Secondary | ICD-10-CM | POA: Diagnosis not present

## 2014-08-16 DIAGNOSIS — G894 Chronic pain syndrome: Secondary | ICD-10-CM

## 2014-08-16 DIAGNOSIS — Z5181 Encounter for therapeutic drug level monitoring: Secondary | ICD-10-CM

## 2014-08-16 MED ORDER — OXYCODONE-ACETAMINOPHEN 5-325 MG PO TABS: 1.0000 | ORAL_TABLET | Freq: Two times a day (BID) | ORAL | Status: DC

## 2014-08-16 MED ORDER — OXYMORPHONE HCL ER 10 MG PO TB12: 10.0000 mg | ORAL_TABLET | Freq: Two times a day (BID) | ORAL | Status: DC

## 2014-08-16 NOTE — Progress Notes (Signed)
Subjective:    Patient ID: David LeedsJohn O [name], male    DOB: [birth year], 38 y.o.   MRN: [id number]  HPI: David [name] is a 38 year old male who returns for follow up for chronic pain and medication refill.He says his pain is located in his mid- back. He rates his pain 6. His current exercise regime is walking, lifting light weights and light cardio.  He still working about 70 hours a week.   Pain Inventory Average Pain 6 Pain Right Now 6 My pain is intermittent, constant, sharp, burning, dull, stabbing, tingling and aching  In the last 24 hours, has pain interfered with the following? General activity 6 Relation with others 5 Enjoyment of life 6 What TIME of day is your pain at its worst? All Sleep (in general) Fair  Pain is worse with: walking, bending, sitting, inactivity, standing and some activites Pain improves with: rest, heat/ice, therapy/exercise, pacing activities and medication Relief from Meds: 5  Mobility walk without assistance how many minutes can you walk? 60 minutes ability to climb steps?  yes do you drive?  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  no  Physicians involved in your care Any changes since last visit?  no   Family History  Problem Relation Age of Onset  . Arthritis Mother   . Heart disease Mother   . Diabetes Mother   . Heart disease Father   . Diabetes Father    History   Social History  . Marital Status: Married    Spouse Name: N/A    Number of Children: N/A  . Years of Education: N/A   Social History Main Topics  . Smoking status: Never Smoker   . Smokeless tobacco: Never Used  . Alcohol Use: No  . Drug Use: No  . Sexual Activity: None   Other Topics Concern  . None   Social History Narrative  . None   No past surgical history on file. Past Medical History  Diagnosis Date  . Chronic pain syndrome   .  Postlaminectomy syndrome, cervical region   . 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 Thoracic radiculopathy   . Lumbago   . Sciatica   . Primary localized osteoarthrosis, lower leg   . Pain in joint, lower leg    BP 111/60  Pulse 66  Resp 14  Ht 6\' 3"  (1.905 m)  Wt 232 lb 6.4 oz (105.416 kg)  BMI 29.05 kg/m2  SpO2 96%  Opioid Risk Score:   Fall Risk Score: Low Fall Risk (0-5 points)  Review of Systems     Objective:   Physical Exam  Nursing note and vitals reviewed. Constitutional: He is oriented to person, place, and time. He appears well-developed and well-nourished.  HENT:  Head: Normocephalic and atraumatic.  Neck: Normal range of motion. Neck supple.  Cardiovascular: Normal rate and regular rhythm.   Pulmonary/Chest: Effort normal and breath sounds normal.  Musculoskeletal:  Normal Muscle Bulk and Muscle testing Reveals: Upper Extremities: Full ROM and Muscle Strength 5/5 Thoracic Paraspinal tenderness: T-5- T-7 Lower Extremities: Full ROM and Muscle Strength 5/5 Arises from chair with ease Narrow Based gait  Neurological: He is alert and oriented to person, place, and time.  Skin: Skin is warm and dry.  Psychiatric: He has a normal mood and affect.  Assessment & Plan:  1. Cervical postlaminectomy syndrome, Hx of ACDF C4-5, C6-7, C7-T1, 02/2008. Continue current medication and alternate using heat and ice therapy.  2. Spondylosis of L-spine . Continue current treatment regime.  3. Chronic postoperative pain:  Refilled: Opana 10 mg one tablet twice a day #60 and Oxycodone 5/325mg  one tablet twice a day #60  4. 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 Continue Gabapentin, Keep blood sugars under tight control. Continue to monitor.  15 minutes of face to face patient care time was spent during this visit. All questions were encouraged and answered.  F/U in 1 month

## 2014-08-17 ENCOUNTER — Ambulatory Visit: Payer: BC Managed Care – PPO | Admitting: Registered Nurse

## 2014-09-14 ENCOUNTER — Encounter: Payer: BC Managed Care – PPO | Attending: Physical Medicine & Rehabilitation | Admitting: Registered Nurse

## 2014-09-14 ENCOUNTER — Encounter: Payer: Self-pay | Admitting: Registered Nurse

## 2014-09-14 ENCOUNTER — Ambulatory Visit: Payer: BC Managed Care – PPO | Admitting: Physical Medicine & Rehabilitation

## 2014-09-14 VITALS — BP 128/77 | HR 81 | Resp 16 | Ht 75.0 in | Wt 233.0 lb

## 2014-09-14 DIAGNOSIS — M5430 Sciatica, unspecified side: Secondary | ICD-10-CM | POA: Insufficient documentation

## 2014-09-14 DIAGNOSIS — M5414 Radiculopathy, thoracic region: Secondary | ICD-10-CM | POA: Diagnosis not present

## 2014-09-14 DIAGNOSIS — G8929 Other chronic pain: Secondary | ICD-10-CM | POA: Insufficient documentation

## 2014-09-14 DIAGNOSIS — M961 Postlaminectomy syndrome, not elsewhere classified: Secondary | ICD-10-CM | POA: Diagnosis not present

## 2014-09-14 DIAGNOSIS — M5134 Other intervertebral disc degeneration, thoracic region: Secondary | ICD-10-CM | POA: Insufficient documentation

## 2014-09-14 DIAGNOSIS — M79669 Pain in unspecified lower leg: Secondary | ICD-10-CM | POA: Diagnosis not present

## 2014-09-14 DIAGNOSIS — E119 Type 2 diabetes mellitus without complications: Secondary | ICD-10-CM | POA: Diagnosis not present

## 2014-09-14 DIAGNOSIS — M179 Osteoarthritis of knee, unspecified: Secondary | ICD-10-CM | POA: Diagnosis not present

## 2014-09-14 DIAGNOSIS — Z760 Encounter for issue of repeat prescription: Secondary | ICD-10-CM | POA: Insufficient documentation

## 2014-09-14 DIAGNOSIS — Z79899 Other long term (current) drug therapy: Secondary | ICD-10-CM

## 2014-09-14 DIAGNOSIS — Z5181 Encounter for therapeutic drug level monitoring: Secondary | ICD-10-CM

## 2014-09-14 DIAGNOSIS — M545 Low back pain, unspecified: Secondary | ICD-10-CM

## 2014-09-14 DIAGNOSIS — M47896 Other spondylosis, lumbar region: Secondary | ICD-10-CM | POA: Diagnosis not present

## 2014-09-14 DIAGNOSIS — G894 Chronic pain syndrome: Secondary | ICD-10-CM

## 2014-09-14 DIAGNOSIS — M5489 Other dorsalgia: Secondary | ICD-10-CM | POA: Insufficient documentation

## 2014-09-14 DIAGNOSIS — M5135 Other intervertebral disc degeneration, thoracolumbar region: Secondary | ICD-10-CM | POA: Diagnosis not present

## 2014-09-14 MED ORDER — OXYMORPHONE HCL ER 10 MG PO TB12: 10.0000 mg | ORAL_TABLET | Freq: Two times a day (BID) | ORAL | Status: DC

## 2014-09-14 MED ORDER — OXYCODONE-ACETAMINOPHEN 5-325 MG PO TABS: 1.0000 | ORAL_TABLET | Freq: Two times a day (BID) | ORAL | Status: DC

## 2014-09-14 NOTE — Progress Notes (Signed)
Subjective:    Patient ID: David [name], male    DOB: [birth year], 38 y.o.   MRN: [id number]  HPI: David [name] is a 38 year old male who returns for follow up for chronic pain and medication refill.He says his pain is located in his mid- back. He rates his pain 5. His current exercise regime is walking, lifting light weights and light cardio.  He still working about 70 hours a week.    Pain Inventory Average Pain 5 Pain Right Now 5 My pain is intermittent, constant, sharp, burning, dull, stabbing, tingling and aching  In the last 24 hours, has pain interfered with the following? General activity 6 Relation with others 6 Enjoyment of life 7 What TIME of day is your pain at its worst? morning, daytime, evening, night Sleep (in general) Fair  Pain is worse with: walking, bending, sitting, inactivity, standing and some activites Pain improves with: rest, heat/ice, therapy/exercise, pacing activities and medication Relief from Meds: 5  Mobility walk without assistance how many minutes can you walk? 60 ability to climb steps?  yes do you drive?  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  no  Physicians involved in your care Any changes since last visit?  no   Family History  Problem Relation Age of Onset  . Arthritis Mother   . Heart disease Mother   . Diabetes Mother   . Heart disease Father   . Diabetes Father    History   Social History  . Marital Status: Married    Spouse Name: N/A    Number of Children: N/A  . Years of Education: N/A   Social History Main Topics  . Smoking status: Never Smoker   . Smokeless tobacco: Never Used  . Alcohol Use: No  . Drug Use: No  . Sexual Activity: None   Other Topics Concern  . None   Social History Narrative   History reviewed. No pertinent past  surgical history. Past Medical History  Diagnosis Date  . Chronic pain syndrome   . Postlaminectomy syndrome, cervical region   . 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 Thoracic radiculopathy   . Lumbago   . Sciatica   . Primary localized osteoarthrosis, lower leg   . Pain in joint, lower leg    BP 128/77 mmHg  Pulse 81  Resp 16  Ht 6\' 3"  (1.905 m)  Wt 233 lb (105.688 kg)  BMI 29.12 kg/m2  SpO2 98%  Opioid Risk Score:   Fall Risk Score: Low Fall Risk (0-5 points)  Review of Systems  Musculoskeletal: Positive for myalgias, back pain, joint swelling, arthralgias, neck pain and neck stiffness.       Arthritis  Neurological: Positive for numbness.       Tingling, spasm  Psychiatric/Behavioral: Positive for dysphoric mood. The patient is nervous/anxious.        Objective:   Physical Exam  Constitutional: He is oriented to person, place, and time. He appears well-developed and well-nourished.  HENT:  Head: Normocephalic and atraumatic.  Neck: Normal range of motion. Neck supple.  Cardiovascular: Normal rate and regular rhythm.   Pulmonary/Chest: Effort normal and breath sounds normal.  Musculoskeletal:  Normal Muscle Bulk and Muscle Testing Reveals: Upper Extremities: Full ROM and Muscle Strength 5/5 Thoracic Paraspinal tenderness: T-5- T-7 Lower Extremities: Full ROM  and Muscle Strength 5/5 Arises from chair with ease Narrow Based Gait   Neurological: He is alert and oriented to person, place, and time.  Skin: Skin is warm and dry.  Psychiatric: He has a normal mood and affect.  Nursing note and vitals reviewed.         Assessment & Plan:  1. Cervical postlaminectomy syndrome, Hx of ACDF C4-5, C6-7, C7-T1, 02/2008. Continue current medication and alternate using heat and ice therapy.  2. Spondylosis of L-spine . Continue current treatment regime.  3. Chronic postoperative pain: Thoracic Pain: Offered Trigger Point Injection: Will  think about it. Refilled: Opana 10 mg one tablet twice a day #60 and Oxycodone 5/325mg  one tablet twice a day #60  4. 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 Continue Gabapentin, Keep blood sugars under tight control. Continue to monitor.   15 minutes of face to face patient care time was spent during this visit. All questions were encouraged and answered.  F/U in 1 month

## 2014-10-17 ENCOUNTER — Encounter: Payer: Self-pay | Admitting: Registered Nurse

## 2014-10-17 ENCOUNTER — Encounter: Payer: BC Managed Care – PPO | Attending: Physical Medicine & Rehabilitation | Admitting: Registered Nurse

## 2014-10-17 ENCOUNTER — Other Ambulatory Visit: Payer: Self-pay | Admitting: Physical Medicine & Rehabilitation

## 2014-10-17 VITALS — BP 120/70 | HR 84 | Resp 14

## 2014-10-17 DIAGNOSIS — M79669 Pain in unspecified lower leg: Secondary | ICD-10-CM | POA: Diagnosis not present

## 2014-10-17 DIAGNOSIS — M47896 Other spondylosis, lumbar region: Secondary | ICD-10-CM | POA: Diagnosis not present

## 2014-10-17 DIAGNOSIS — M5430 Sciatica, unspecified side: Secondary | ICD-10-CM | POA: Insufficient documentation

## 2014-10-17 DIAGNOSIS — M961 Postlaminectomy syndrome, not elsewhere classified: Secondary | ICD-10-CM

## 2014-10-17 DIAGNOSIS — G894 Chronic pain syndrome: Secondary | ICD-10-CM

## 2014-10-17 DIAGNOSIS — Z760 Encounter for issue of repeat prescription: Secondary | ICD-10-CM | POA: Diagnosis present

## 2014-10-17 DIAGNOSIS — M5135 Other intervertebral disc degeneration, thoracolumbar region: Secondary | ICD-10-CM | POA: Insufficient documentation

## 2014-10-17 DIAGNOSIS — M5134 Other intervertebral disc degeneration, thoracic region: Secondary | ICD-10-CM | POA: Insufficient documentation

## 2014-10-17 DIAGNOSIS — M5414 Radiculopathy, thoracic region: Secondary | ICD-10-CM | POA: Diagnosis not present

## 2014-10-17 DIAGNOSIS — G8929 Other chronic pain: Secondary | ICD-10-CM | POA: Diagnosis not present

## 2014-10-17 DIAGNOSIS — M179 Osteoarthritis of knee, unspecified: Secondary | ICD-10-CM | POA: Diagnosis not present

## 2014-10-17 DIAGNOSIS — M5489 Other dorsalgia: Secondary | ICD-10-CM | POA: Insufficient documentation

## 2014-10-17 DIAGNOSIS — M545 Low back pain, unspecified: Secondary | ICD-10-CM

## 2014-10-17 DIAGNOSIS — E119 Type 2 diabetes mellitus without complications: Secondary | ICD-10-CM | POA: Insufficient documentation

## 2014-10-17 DIAGNOSIS — Z79899 Other long term (current) drug therapy: Secondary | ICD-10-CM

## 2014-10-17 DIAGNOSIS — Z5181 Encounter for therapeutic drug level monitoring: Secondary | ICD-10-CM

## 2014-10-17 MED ORDER — OXYMORPHONE HCL ER 10 MG PO TB12: 10.0000 mg | ORAL_TABLET | Freq: Two times a day (BID) | ORAL | Status: DC

## 2014-10-17 MED ORDER — OXYCODONE-ACETAMINOPHEN 5-325 MG PO TABS: 1.0000 | ORAL_TABLET | Freq: Two times a day (BID) | ORAL | Status: DC

## 2014-10-17 NOTE — Progress Notes (Signed)
Subjective:    Patient ID: David [name], male    DOB: 1976/08/04, 38 y.o.   MRN: [id number]  HPI: [name] is a 38 year old male who returns for follow up for chronic pain and medication refill.He's complaining of generalized pain all over with a concentration of pain between shoulder blades and lower back. He rates his pain 6. His current exercise regime is walking. He hasn't followed his usual current exercise regime due to increase intensity of pain due to long work hours. He's hoping his work load will decrease in a week.   He still working about 70 hours a week.   Pain Inventory Average Pain 5 Pain Right Now 6 My pain is intermittent, constant, sharp, burning, dull, stabbing, tingling and aching  In the last 24 hours, has pain interfered with the following? General activity 7 Relation with others 6 Enjoyment of life 7 What TIME of day is your pain at its worst? all Sleep (in general) Fair  Pain is worse with: walking, bending, sitting, inactivity, standing and some activites Pain improves with: rest, heat/ice, therapy/exercise, pacing activities and medication Relief from Meds: 5  Mobility walk without assistance how many minutes can you walk? 60 ability to climb steps?  yes do you drive?  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  no  Physicians involved in your care Any changes since last visit?  no   Family History  Problem Relation Age of Onset  . Arthritis Mother   . Heart disease Mother   . Diabetes Mother   . Heart disease Father   . Diabetes Father    History   Social History  . Marital Status: Married    Spouse Name: N/A    Number of Children: N/A  . Years of Education: N/A   Social History Main Topics  . Smoking status: Never Smoker   . Smokeless tobacco: Never Used  . Alcohol Use: No  . Drug  Use: No  . Sexual Activity: None   Other Topics Concern  . None   Social History Narrative   History reviewed. No pertinent past surgical history. Past Medical History  Diagnosis Date  . Chronic pain syndrome   . Postlaminectomy syndrome, cervical region   . 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 Thoracic radiculopathy   . Lumbago   . Sciatica   . Primary localized osteoarthrosis, lower leg   . Pain in joint, lower leg    BP 120/70 mmHg  Pulse 84  Resp 14  SpO2 97%  Opioid Risk Score:   Fall Risk Score: Low Fall Risk (0-5 points) (pt has rec'd pamphlet during previous visit) Review of Systems  Neurological: Positive for numbness.       Tingling spasms  Psychiatric/Behavioral: Positive for decreased concentration. The patient is nervous/anxious.   All other systems reviewed and are negative.      Objective:   Physical Exam  Constitutional: He is oriented to person, place, and time. He appears well-developed and well-nourished.  HENT:  Head: Normocephalic and atraumatic.  Neck: Normal range of motion. Neck supple.  Cardiovascular: Normal rate and regular rhythm.   Pulmonary/Chest: Effort normal and breath sounds normal.  Musculoskeletal:  Normal Muscle Bulk and muscle testing reveals: Upper extremities: Full ROM and Muscle strength 5/5 Thoracic Paraspinal tenderness: T-5- T-7 Lumbar Paraspinal tenderness:  L-3- L-5 Lower extremities: Full ROM and Muscle strength 5/5 Arises from chair with ease Narrow based gait  Neurological: He is alert and oriented to person, place, and time.  Skin: Skin is warm and dry.  Psychiatric: He has a normal mood and affect.  Nursing note and vitals reviewed.         Assessment & Plan:  1. Cervical postlaminectomy syndrome, Hx of ACDF C4-5, C6-7, C7-T1, 02/2008. Continue current medication and alternate using heat and ice therapy.  2. Spondylosis of L-spine . Continue current treatment regime.  3.  Chronic postoperative pain:  Refilled: Opana 10 mg one tablet twice a day #60 and Oxycodone 5/325mg  one tablet twice a day #60  4. 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 Continue Gabapentin, Keep blood sugars under tight control. Continue to monitor.   15 minutes of face to face patient care time was spent during this visit. All questions were encouraged and answered.  F/U in 1 month

## 2014-10-18 LAB — PMP ALCOHOL METABOLITE (ETG): ETGU: NEGATIVE ng/mL

## 2014-10-23 LAB — PRESCRIPTION MONITORING PROFILE (SOLSTAS)
Barbiturate Screen, Urine: NEGATIVE ng/mL
Buprenorphine, Urine: NEGATIVE ng/mL
CANNABINOID SCRN UR: NEGATIVE ng/mL
Carisoprodol, Urine: NEGATIVE ng/mL
Cocaine Metabolites: NEGATIVE ng/mL
Creatinine, Urine: 436.43 mg/dL (ref >20.0)
ECSTASY: NEGATIVE ng/mL
Fentanyl, Ur: NEGATIVE ng/mL
Meperidine, Ur: NEGATIVE ng/mL
Methadone Screen, Urine: NEGATIVE ng/mL
Nitrites, Initial: NEGATIVE ug/mL
Propoxyphene: NEGATIVE ng/mL
TAPENTADOLUR: NEGATIVE ng/mL
TRAMADOL UR: NEGATIVE ng/mL
Zolpidem, Urine: NEGATIVE ng/mL
pH, Initial: 5 pH (ref 4.5–8.9)

## 2014-10-23 LAB — BENZODIAZEPINES (GC/LC/MS), URINE
ALPRAZOLAMU: NEGATIVE ng/mL (ref <25)
Clonazepam metabolite (GC/LC/MS), ur confirm: NEGATIVE ng/mL (ref <25)
FLURAZEPAMU: NEGATIVE ng/mL (ref <50)
LORAZEPAMU: NEGATIVE ng/mL (ref <50)
Midazolam (GC/LC/MS), ur confirm: NEGATIVE ng/mL (ref <50)
NORDIAZEPAMU: 386 ng/mL (ref <50)
Oxazepam (GC/LC/MS), ur confirm: 1642 ng/mL (ref <50)
TEMAZEPAMU: 1039 ng/mL (ref <50)
TRIAZOLAMU: NEGATIVE ng/mL (ref <50)

## 2014-10-23 LAB — OPIATES/OPIOIDS (LC/MS-MS)
CODEINE URINE: NEGATIVE ng/mL (ref <50)
HYDROCODONE: NEGATIVE ng/mL (ref <50)
Hydromorphone: NEGATIVE ng/mL (ref <50)
Morphine Urine: NEGATIVE ng/mL (ref <50)
NORHYDROCODONE, UR: NEGATIVE ng/mL (ref <50)
NOROXYCODONE, UR: 1716 ng/mL (ref <50)
OXYMORPHONE, URINE: 45895 ng/mL (ref <50)
Oxycodone, ur: 1198 ng/mL (ref <50)

## 2014-10-23 LAB — OXYCODONE, URINE (LC/MS-MS)
Noroxycodone, Ur: 1716 ng/mL (ref <50)
Oxycodone, ur: 1198 ng/mL (ref <50)
Oxymorphone: 45895 ng/mL (ref <50)

## 2014-10-23 LAB — AMPHETAMINES (GC/LC/MS), URINE
Amphetamine GC/MS Conf: NEGATIVE ng/mL (ref <250)
Methamphetamine Quant, Ur: NEGATIVE ng/mL (ref <250)

## 2014-11-07 NOTE — Progress Notes (Signed)
Urine drug screen for this encounter is consistent for prescribed medications.   

## 2014-11-16 ENCOUNTER — Encounter: Payer: BLUE CROSS/BLUE SHIELD | Attending: Physical Medicine & Rehabilitation | Admitting: Registered Nurse

## 2014-11-16 ENCOUNTER — Encounter: Payer: Self-pay | Admitting: Registered Nurse

## 2014-11-16 VITALS — BP 104/67 | HR 94 | Resp 14

## 2014-11-16 DIAGNOSIS — G8929 Other chronic pain: Secondary | ICD-10-CM | POA: Insufficient documentation

## 2014-11-16 DIAGNOSIS — M5489 Other dorsalgia: Secondary | ICD-10-CM | POA: Diagnosis not present

## 2014-11-16 DIAGNOSIS — M5134 Other intervertebral disc degeneration, thoracic region: Secondary | ICD-10-CM | POA: Diagnosis not present

## 2014-11-16 DIAGNOSIS — M5430 Sciatica, unspecified side: Secondary | ICD-10-CM | POA: Diagnosis not present

## 2014-11-16 DIAGNOSIS — Z5181 Encounter for therapeutic drug level monitoring: Secondary | ICD-10-CM

## 2014-11-16 DIAGNOSIS — M47896 Other spondylosis, lumbar region: Secondary | ICD-10-CM | POA: Diagnosis not present

## 2014-11-16 DIAGNOSIS — E119 Type 2 diabetes mellitus without complications: Secondary | ICD-10-CM | POA: Diagnosis not present

## 2014-11-16 DIAGNOSIS — M79669 Pain in unspecified lower leg: Secondary | ICD-10-CM | POA: Insufficient documentation

## 2014-11-16 DIAGNOSIS — Z760 Encounter for issue of repeat prescription: Secondary | ICD-10-CM | POA: Insufficient documentation

## 2014-11-16 DIAGNOSIS — M179 Osteoarthritis of knee, unspecified: Secondary | ICD-10-CM | POA: Insufficient documentation

## 2014-11-16 DIAGNOSIS — M5135 Other intervertebral disc degeneration, thoracolumbar region: Secondary | ICD-10-CM | POA: Insufficient documentation

## 2014-11-16 DIAGNOSIS — M5414 Radiculopathy, thoracic region: Secondary | ICD-10-CM | POA: Diagnosis not present

## 2014-11-16 DIAGNOSIS — M961 Postlaminectomy syndrome, not elsewhere classified: Secondary | ICD-10-CM

## 2014-11-16 DIAGNOSIS — G894 Chronic pain syndrome: Secondary | ICD-10-CM

## 2014-11-16 DIAGNOSIS — Z79899 Other long term (current) drug therapy: Secondary | ICD-10-CM

## 2014-11-16 DIAGNOSIS — M5440 Lumbago with sciatica, unspecified side: Secondary | ICD-10-CM

## 2014-11-16 MED ORDER — OXYCODONE-ACETAMINOPHEN 5-325 MG PO TABS: 1.0000 | ORAL_TABLET | Freq: Two times a day (BID) | ORAL | Status: DC

## 2014-11-16 MED ORDER — OXYMORPHONE HCL ER 10 MG PO TB12: 10.0000 mg | ORAL_TABLET | Freq: Two times a day (BID) | ORAL | Status: DC

## 2014-11-16 NOTE — Progress Notes (Signed)
Subjective:    Patient ID: David [name], male    DOB: November 29, 1975, 39 y.o.   MRN: [id number]  HPI: [name] is a 39 year old male who returns for follow up for chronic pain and medication refill.He states his pain is located in his neck, bilateral hips and bilateral knees. He rates his pain 7. His current exercise regime is walking and performing his stretching exercises.   He still working about 70 hours a week.    Pain Inventory Average Pain 5 Pain Right Now 7 My pain is intermittent, constant, sharp, burning, dull, stabbing, tingling and aching  In the last 24 hours, has pain interfered with the following? General activity 6 Relation with others 6 Enjoyment of life 7 What TIME of day is your pain at its worst? all Sleep (in general) Fair  Pain is worse with: walking, bending, sitting, inactivity, standing and some activites Pain improves with: rest, heat/ice, therapy/exercise, pacing activities and medication Relief from Meds: 5  Mobility walk without assistance how many minutes can you walk? 60 ability to climb steps?  yes do you drive?  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  no  Physicians involved in your care Any changes since last visit?  no   Family History  Problem Relation Age of Onset  . Arthritis Mother   . Heart disease Mother   . Diabetes Mother   . Heart disease Father   . Diabetes Father    History   Social History  . Marital Status: Married    Spouse Name: N/A    Number of Children: N/A  . Years of Education: N/A   Social History Main Topics  . Smoking status: Never Smoker   . Smokeless tobacco: Never Used  . Alcohol Use: No  . Drug Use: No  . Sexual Activity: None   Other Topics Concern  . None   Social History Narrative   History reviewed. No pertinent past surgical  history. Past Medical History  Diagnosis Date  . Chronic pain syndrome   . Postlaminectomy syndrome, cervical region   . 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 Thoracic radiculopathy   . Lumbago   . Sciatica   . Primary localized osteoarthrosis, lower leg   . Pain in joint, lower leg    BP 104/67 mmHg  Pulse 94  Resp 14  SpO2 98%  Opioid Risk Score:   Fall Risk Score: Low Fall Risk (0-5 points) Review of Systems  Neurological: Positive for numbness.       Tingling Spasms   Psychiatric/Behavioral: Positive for dysphoric mood. The patient is nervous/anxious.   All other systems reviewed and are negative.      Objective:   Physical Exam  Constitutional: He is oriented to person, place, and time. He appears well-developed and well-nourished.  HENT:  Head: Normocephalic and atraumatic.  Neck: Normal range of motion. Neck supple.  Cardiovascular: Normal rate and regular rhythm.   Pulmonary/Chest: Effort normal and breath sounds normal.  Musculoskeletal:  Normal Muscle Bulk and Muscle testing Reveals: Upper Extremites: Full ROM and Muscle Strength 5/5 Thoracic and Lumber Tightness with Palpation Lower Extremities: Full ROM and Muscle Strength 5/5 Right Leg Flexion with Popping Sound Noted Arises from chair with ease Narrow Based Gait  Neurological: He is alert and oriented to person, place, and time.  Skin: Skin is warm and dry.  Psychiatric: He has a normal mood and affect.  Nursing note and vitals reviewed.         Assessment & Plan:  1. Cervical postlaminectomy syndrome, Hx of ACDF C4-5, C6-7, C7-T1, 02/2008. Continue current medication and alternate using heat and ice therapy.  2. Spondylosis of L-spine . Continue current treatment regime.  3. Chronic postoperative pain:  Refilled: Opana 10 mg one tablet twice a day #60 and Oxycodone 5/325mg  one tablet twice a day #60  4. 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 Continue Gabapentin,  Keep blood sugars under tight control. Continue to monitor.   15 minutes of face to face patient care time was spent during this visit. All questions were encouraged and answered.  F/U in 1 month

## 2014-12-13 ENCOUNTER — Encounter: Payer: BLUE CROSS/BLUE SHIELD | Attending: Physical Medicine & Rehabilitation | Admitting: Registered Nurse

## 2014-12-13 ENCOUNTER — Encounter: Payer: Self-pay | Admitting: Registered Nurse

## 2014-12-13 VITALS — BP 130/80 | HR 104 | Resp 16

## 2014-12-13 DIAGNOSIS — M5135 Other intervertebral disc degeneration, thoracolumbar region: Secondary | ICD-10-CM | POA: Insufficient documentation

## 2014-12-13 DIAGNOSIS — Z760 Encounter for issue of repeat prescription: Secondary | ICD-10-CM | POA: Diagnosis present

## 2014-12-13 DIAGNOSIS — M5414 Radiculopathy, thoracic region: Secondary | ICD-10-CM | POA: Insufficient documentation

## 2014-12-13 DIAGNOSIS — G894 Chronic pain syndrome: Secondary | ICD-10-CM

## 2014-12-13 DIAGNOSIS — M47817 Spondylosis without myelopathy or radiculopathy, lumbosacral region: Secondary | ICD-10-CM

## 2014-12-13 DIAGNOSIS — Z79899 Other long term (current) drug therapy: Secondary | ICD-10-CM

## 2014-12-13 DIAGNOSIS — M5430 Sciatica, unspecified side: Secondary | ICD-10-CM | POA: Diagnosis not present

## 2014-12-13 DIAGNOSIS — Z5181 Encounter for therapeutic drug level monitoring: Secondary | ICD-10-CM

## 2014-12-13 DIAGNOSIS — G8929 Other chronic pain: Secondary | ICD-10-CM | POA: Insufficient documentation

## 2014-12-13 DIAGNOSIS — M5489 Other dorsalgia: Secondary | ICD-10-CM | POA: Diagnosis not present

## 2014-12-13 DIAGNOSIS — M961 Postlaminectomy syndrome, not elsewhere classified: Secondary | ICD-10-CM | POA: Insufficient documentation

## 2014-12-13 DIAGNOSIS — M47896 Other spondylosis, lumbar region: Secondary | ICD-10-CM | POA: Diagnosis not present

## 2014-12-13 DIAGNOSIS — E119 Type 2 diabetes mellitus without complications: Secondary | ICD-10-CM | POA: Insufficient documentation

## 2014-12-13 DIAGNOSIS — M79669 Pain in unspecified lower leg: Secondary | ICD-10-CM | POA: Insufficient documentation

## 2014-12-13 DIAGNOSIS — M179 Osteoarthritis of knee, unspecified: Secondary | ICD-10-CM | POA: Diagnosis not present

## 2014-12-13 DIAGNOSIS — M5134 Other intervertebral disc degeneration, thoracic region: Secondary | ICD-10-CM | POA: Diagnosis not present

## 2014-12-13 MED ORDER — OXYCODONE-ACETAMINOPHEN 5-325 MG PO TABS: 1.0000 | ORAL_TABLET | Freq: Two times a day (BID) | ORAL | Status: DC

## 2014-12-13 MED ORDER — OXYMORPHONE HCL ER 10 MG PO TB12: 10.0000 mg | ORAL_TABLET | Freq: Two times a day (BID) | ORAL | Status: DC

## 2014-12-13 NOTE — Progress Notes (Signed)
Subjective:    Patient ID: David [name], male    DOB: 1976/08/15, 39 y.o.   MRN: [id number]  HPI: David [name] is a 39 year old male who returns for follow up for chronic pain and medication refill.He states his pain is located in his neck, mid-lower back, bilateral hips and bilateral knees. He rates his pain 6. His current exercise regime is walking and performing his stretching exercises.  He arrived tachycardic apical pulse checked 100.  He still working about 70 hours a week.    Pain Inventory Average Pain 5 Pain Right Now 6 My pain is intermittent, constant, sharp, burning, dull, stabbing, tingling and aching  In the last 24 hours, has pain interfered with the following? General activity 6 Relation with others 6 Enjoyment of life 6 What TIME of day is your pain at its worst? ALL Sleep (in general) Fair  Pain is worse with: walking, bending, sitting, standing and some activites Pain improves with: rest, heat/ice, therapy/exercise, pacing activities and medication Relief from Meds: 6  Mobility walk without assistance how many minutes can you walk? 60 ability to climb steps?  yes do you drive?  yes  Function employed # of hrs/week 50  Neuro/Psych numbness tingling spasms depression anxiety  Prior Studies Any changes since last visit?  no  Physicians involved in your care Any changes since last visit?  no   Family History  Problem Relation Age of Onset  . Arthritis Mother   . Heart disease Mother   . Diabetes Mother   . Heart disease Father   . Diabetes Father    History   Social History  . Marital Status: Married    Spouse Name: N/A  . Number of Children: N/A  . Years of Education: N/A   Social History Main Topics  . Smoking status: Never Smoker   . Smokeless tobacco: Never Used  . Alcohol Use: No  . Drug Use: No  . Sexual Activity: Not on file   Other Topics Concern  . None   Social History Narrative   History reviewed. No  pertinent past surgical history. Past Medical History  Diagnosis Date  . Chronic pain syndrome   . Postlaminectomy syndrome, cervical region   . 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 Thoracic radiculopathy   . Lumbago   . Sciatica   . Primary localized osteoarthrosis, lower leg   . Pain in joint, lower leg    BP 130/80 mmHg  Pulse 104  Resp 16  SpO2 99%  Opioid Risk Score:   Fall Risk Score: Low Fall Risk (0-5 points)  Review of Systems  Constitutional: Negative.   HENT: Negative.   Eyes: Negative.   Respiratory: Negative.   Cardiovascular: Negative.   Gastrointestinal: Negative.   Endocrine: Negative.   Genitourinary: Negative.   Musculoskeletal: Positive for myalgias, back pain, arthralgias and neck pain.  Skin: Negative.   Allergic/Immunologic: Negative.   Neurological: Positive for numbness.       Tingling, spasms  Hematological: Negative.   Psychiatric/Behavioral: Positive for dysphoric mood. The patient is nervous/anxious.        Objective:   Physical Exam  Constitutional: He is oriented to person, place, and time. He appears well-developed and well-nourished.  HENT:  Head: Normocephalic and atraumatic.  Neck: Normal range of motion. Neck supple.  Cardiovascular: Normal rate and regular rhythm.   Pulmonary/Chest: Effort normal and breath sounds normal.  Musculoskeletal:  Normal Muscle Bulk and  Muscle Testing Reveals: Upper extremities: Full ROM and Muscle strength 5/5 Thoracic and Lumbar Tightness Lower Extremities: Full ROM and Muscle strength 5/5 Arises from chair with ease Narrow Based Gait  Neurological: He is alert and oriented to person, place, and time.  Skin: Skin is warm and dry.  Psychiatric: He has a normal mood and affect.  Nursing note and vitals reviewed.         Assessment & Plan:  1. Cervical postlaminectomy syndrome, Hx of ACDF C4-5, C6-7, C7-T1, 02/2008. Continue current medication and alternate using  heat and ice therapy.  2. Spondylosis of L-spine . Continue current treatment regime.  3. Chronic postoperative pain:  Refilled: Opana 10 mg one tablet twice a day #60 and Oxycodone 5/325mg  one tablet twice a day #60  4. 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 Continue Gabapentin, Keep blood sugars under tight control. Continue to monitor.   15 minutes of face to face patient care time was spent during this visit. All questions were encouraged and answered.  F/U in 1 month

## 2015-01-11 ENCOUNTER — Encounter: Payer: BLUE CROSS/BLUE SHIELD | Attending: Physical Medicine & Rehabilitation | Admitting: Registered Nurse

## 2015-01-11 ENCOUNTER — Encounter: Payer: Self-pay | Admitting: Registered Nurse

## 2015-01-11 VITALS — BP 120/66 | HR 87 | Resp 14

## 2015-01-11 DIAGNOSIS — M179 Osteoarthritis of knee, unspecified: Secondary | ICD-10-CM | POA: Insufficient documentation

## 2015-01-11 DIAGNOSIS — M79669 Pain in unspecified lower leg: Secondary | ICD-10-CM | POA: Insufficient documentation

## 2015-01-11 DIAGNOSIS — M5134 Other intervertebral disc degeneration, thoracic region: Secondary | ICD-10-CM | POA: Diagnosis not present

## 2015-01-11 DIAGNOSIS — M961 Postlaminectomy syndrome, not elsewhere classified: Secondary | ICD-10-CM | POA: Diagnosis not present

## 2015-01-11 DIAGNOSIS — M5430 Sciatica, unspecified side: Secondary | ICD-10-CM | POA: Diagnosis not present

## 2015-01-11 DIAGNOSIS — M5489 Other dorsalgia: Secondary | ICD-10-CM | POA: Insufficient documentation

## 2015-01-11 DIAGNOSIS — E119 Type 2 diabetes mellitus without complications: Secondary | ICD-10-CM | POA: Diagnosis not present

## 2015-01-11 DIAGNOSIS — M5414 Radiculopathy, thoracic region: Secondary | ICD-10-CM | POA: Diagnosis not present

## 2015-01-11 DIAGNOSIS — M5135 Other intervertebral disc degeneration, thoracolumbar region: Secondary | ICD-10-CM | POA: Diagnosis not present

## 2015-01-11 DIAGNOSIS — G8929 Other chronic pain: Secondary | ICD-10-CM | POA: Diagnosis not present

## 2015-01-11 DIAGNOSIS — M47896 Other spondylosis, lumbar region: Secondary | ICD-10-CM | POA: Diagnosis not present

## 2015-01-11 DIAGNOSIS — M5440 Lumbago with sciatica, unspecified side: Secondary | ICD-10-CM

## 2015-01-11 DIAGNOSIS — Z79899 Other long term (current) drug therapy: Secondary | ICD-10-CM

## 2015-01-11 DIAGNOSIS — Z760 Encounter for issue of repeat prescription: Secondary | ICD-10-CM | POA: Insufficient documentation

## 2015-01-11 DIAGNOSIS — Z5181 Encounter for therapeutic drug level monitoring: Secondary | ICD-10-CM

## 2015-01-11 DIAGNOSIS — G894 Chronic pain syndrome: Secondary | ICD-10-CM

## 2015-01-11 MED ORDER — OXYMORPHONE HCL ER 10 MG PO TB12: 10.0000 mg | ORAL_TABLET | Freq: Two times a day (BID) | ORAL | Status: DC

## 2015-01-11 MED ORDER — OXYCODONE-ACETAMINOPHEN 5-325 MG PO TABS: 1.0000 | ORAL_TABLET | Freq: Two times a day (BID) | ORAL | Status: DC

## 2015-01-11 MED ORDER — GABAPENTIN 100 MG PO CAPS: 100.0000 mg | ORAL_CAPSULE | Freq: Every day | ORAL | Status: DC

## 2015-01-11 NOTE — Progress Notes (Signed)
Subjective:    Patient ID: David [name], male    DOB: Jul 23, 1976, 39 y.o.   MRN: [id number]  HPI: [name] is a 39 year old male who returns for follow up for chronic pain and medication refill.He states his pain is located in his neck, mid-lower back. He states " he noticed increase intensity of shooting pain radiating from his neck when he looks down. Also having radiating pain from lower back into lower extremities posteriorly and laterally. He rates his pain 6.His current exercise regime is walking and performing his stretching exercises.  He would like to wait for cervical X-ray till next month due to financial hardship, he will call the office if he changes his mind.  Pain Inventory Average Pain 5 Pain Right Now 6 My pain is intermittent, constant, sharp, burning, dull, stabbing, tingling and aching  In the last 24 hours, has pain interfered with the following? General activity 5 Relation with others 6 Enjoyment of life 6 What TIME of day is your pain at its worst? all Sleep (in general) Fair  Pain is worse with: walking, bending, sitting, inactivity, standing and some activites Pain improves with: nothing improves Relief from Meds: 2  Mobility walk without assistance how many minutes can you walk? 60 ability to climb steps?  yes do you drive?  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  no  Physicians involved in your care Any changes since last visit?  no   Family History  Problem Relation Age of Onset  . Arthritis Mother   . Heart disease Mother   . Diabetes Mother   . Heart disease Father   . Diabetes Father    History   Social History  . Marital Status: Married    Spouse Name: N/A  . Number of Children: N/A  . Years of Education: N/A   Social History Main Topics  . Smoking status: Never Smoker   .  Smokeless tobacco: Never Used  . Alcohol Use: No  . Drug Use: No  . Sexual Activity: Not on file   Other Topics Concern  . None   Social History Narrative   History reviewed. No pertinent past surgical history. Past Medical History  Diagnosis Date  . Chronic pain syndrome   . Postlaminectomy syndrome, cervical region   . 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 Thoracic radiculopathy   . Lumbago   . Sciatica   . Primary localized osteoarthrosis, lower leg   . Pain in joint, lower leg    There were no vitals taken for this visit.  Opioid Risk Score:   Fall Risk Score:  (patient previously educated)  Review of Systems  Neurological: Positive for numbness.       Tingling Spasms   Psychiatric/Behavioral: Positive for dysphoric mood. The patient is nervous/anxious.   All other systems reviewed and are negative.      Objective:   Physical Exam  Constitutional: He is oriented to person, place, and time. He appears well-developed and well-nourished.  HENT:  Head: Normocephalic and atraumatic.  Neck: Normal range of motion. Neck supple.  Cardiovascular: Normal rate and regular rhythm.   Pulmonary/Chest: Effort normal and breath sounds normal.  Musculoskeletal:  Normal Muscle Bulk and Muscle Testing Reveals: Upper Extremities: Full ROM and Muscle Strength 5/5 Thoracic and Lumbar Tightness Lower Extremities: Full ROM  and Muscle Strength 5/5 Arises from chair with ease Narrow Based Gait  Neurological: He is alert and oriented to person, place, and time.  Skin: Skin is warm and dry.  Psychiatric: He has a normal mood and affect.  Nursing note and vitals reviewed.         Assessment & Plan:  1. Cervical postlaminectomy syndrome, Hx of ACDF C4-5, C6-7, C7-T1, 02/2008. Continue current medication and alternate using heat and ice therapy.  2. Spondylosis of L-spine . Continue current treatment regime.  3. Chronic postoperative pain:   Refilled: Opana 10 mg one tablet twice a day #60 and Oxycodone 5/325mg  one tablet twice a day #60  4. 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 Continue Gabapentin, Added Gabapentin 100 mg capsule daily in addition to his 400 mg tablet. Will re-assess next month.  Keep blood sugars under tight control. Continue to monitor.   20 minutes of face to face patient care time was spent during this visit. All questions were encouraged and answered.  F/U in 1 month

## 2015-02-05 ENCOUNTER — Other Ambulatory Visit: Payer: Self-pay | Admitting: Registered Nurse

## 2015-02-07 ENCOUNTER — Other Ambulatory Visit: Payer: Self-pay | Admitting: Registered Nurse

## 2015-02-08 ENCOUNTER — Other Ambulatory Visit: Payer: Self-pay | Admitting: Physical Medicine & Rehabilitation

## 2015-02-08 ENCOUNTER — Encounter: Payer: BLUE CROSS/BLUE SHIELD | Attending: Physical Medicine & Rehabilitation | Admitting: Registered Nurse

## 2015-02-08 ENCOUNTER — Encounter: Payer: Self-pay | Admitting: Registered Nurse

## 2015-02-08 VITALS — BP 121/78 | HR 100 | Resp 14

## 2015-02-08 DIAGNOSIS — Z5181 Encounter for therapeutic drug level monitoring: Secondary | ICD-10-CM

## 2015-02-08 DIAGNOSIS — M5430 Sciatica, unspecified side: Secondary | ICD-10-CM | POA: Diagnosis not present

## 2015-02-08 DIAGNOSIS — G8929 Other chronic pain: Secondary | ICD-10-CM | POA: Diagnosis not present

## 2015-02-08 DIAGNOSIS — M5489 Other dorsalgia: Secondary | ICD-10-CM | POA: Insufficient documentation

## 2015-02-08 DIAGNOSIS — Z760 Encounter for issue of repeat prescription: Secondary | ICD-10-CM | POA: Insufficient documentation

## 2015-02-08 DIAGNOSIS — M179 Osteoarthritis of knee, unspecified: Secondary | ICD-10-CM | POA: Insufficient documentation

## 2015-02-08 DIAGNOSIS — M961 Postlaminectomy syndrome, not elsewhere classified: Secondary | ICD-10-CM | POA: Diagnosis not present

## 2015-02-08 DIAGNOSIS — M5134 Other intervertebral disc degeneration, thoracic region: Secondary | ICD-10-CM | POA: Insufficient documentation

## 2015-02-08 DIAGNOSIS — M79669 Pain in unspecified lower leg: Secondary | ICD-10-CM | POA: Insufficient documentation

## 2015-02-08 DIAGNOSIS — G894 Chronic pain syndrome: Secondary | ICD-10-CM | POA: Diagnosis not present

## 2015-02-08 DIAGNOSIS — M5135 Other intervertebral disc degeneration, thoracolumbar region: Secondary | ICD-10-CM | POA: Insufficient documentation

## 2015-02-08 DIAGNOSIS — M5440 Lumbago with sciatica, unspecified side: Secondary | ICD-10-CM

## 2015-02-08 DIAGNOSIS — M47896 Other spondylosis, lumbar region: Secondary | ICD-10-CM | POA: Diagnosis not present

## 2015-02-08 DIAGNOSIS — M47817 Spondylosis without myelopathy or radiculopathy, lumbosacral region: Secondary | ICD-10-CM

## 2015-02-08 DIAGNOSIS — E119 Type 2 diabetes mellitus without complications: Secondary | ICD-10-CM | POA: Diagnosis not present

## 2015-02-08 DIAGNOSIS — M5414 Radiculopathy, thoracic region: Secondary | ICD-10-CM | POA: Diagnosis not present

## 2015-02-08 DIAGNOSIS — Z79899 Other long term (current) drug therapy: Secondary | ICD-10-CM

## 2015-02-08 MED ORDER — GABAPENTIN 100 MG PO CAPS: 100.0000 mg | ORAL_CAPSULE | Freq: Every day | ORAL | Status: DC

## 2015-02-08 MED ORDER — OXYMORPHONE HCL ER 10 MG PO TB12: 10.0000 mg | ORAL_TABLET | Freq: Two times a day (BID) | ORAL | Status: DC

## 2015-02-08 MED ORDER — OXYCODONE-ACETAMINOPHEN 5-325 MG PO TABS: 1.0000 | ORAL_TABLET | Freq: Two times a day (BID) | ORAL | Status: DC

## 2015-02-08 NOTE — Progress Notes (Signed)
Subjective:    Patient ID: David LeedsJohn O [name], male    DOB: [birth year], 39 y.o.   MRN: [id number]  HPI: [name] is a 39 year old male who returns for follow up for chronic pain and medication refill.He states his pain is located in his neck, right shoulder, mid-lower back. He still experiencing shooting pain radiating from his neck when he looks down. Also having radiating pain from mid- lower back into bilateral hips and lower extremities posteriorly . He rates his pain 5.His current exercise regime is walking and performing his stretching exercises.   Pain Inventory Average Pain 5 Pain Right Now 5 My pain is intermittent, constant, sharp, burning, dull, stabbing, tingling and aching  In the last 24 hours, has pain interfered with the following? General activity 4 Relation with others 5 Enjoyment of life 6 What TIME of day is your pain at its worst? all Sleep (in general) Fair  Pain is worse with: walking, bending, sitting, inactivity, standing and some activites Pain improves with: rest, heat/ice, therapy/exercise, pacing activities and medication Relief from Meds: 5  Mobility walk without assistance how many minutes can you walk? 60 ability to climb steps?  yes do you drive?  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  no  Physicians involved in your care Any changes since last visit?  no   Family History  Problem Relation Age of Onset  . Arthritis Mother   . Heart disease Mother   . Diabetes Mother   . Heart disease Father   . Diabetes Father    History   Social History  . Marital Status: Married    Spouse Name: N/A  . Number of Children: N/A  . Years of Education: N/A   Social History Main Topics  . Smoking status: Never Smoker   . Smokeless tobacco: Never Used  . Alcohol Use: No  . Drug Use: No  . Sexual  Activity: Not on file   Other Topics Concern  . None   Social History Narrative   History reviewed. No pertinent past surgical history. Past Medical History  Diagnosis Date  . Chronic pain syndrome   . Postlaminectomy syndrome, cervical region   . 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 Thoracic radiculopathy   . Lumbago   . Sciatica   . Primary localized osteoarthrosis, lower leg   . Pain in joint, lower leg    BP 121/78 mmHg  Pulse 100  Resp 14  SpO2 98%  Opioid Risk Score:   Fall Risk Score: Low Fall Risk (0-5 points)`1  Depression screen PHQ 2/9  Depression screen PHQ 2/9 01/11/2015  Decreased Interest 0  Down, Depressed, Hopeless 1  PHQ - 2 Score 1  Altered sleeping 1  Tired, decreased energy 1  Change in appetite 0  Feeling bad or failure about yourself  0  Trouble concentrating 0  Moving slowly or fidgety/restless 0  Suicidal thoughts 0  PHQ-9 Score 3      Review of Systems  Neurological: Positive for numbness.       Tingling Spasms   Psychiatric/Behavioral: Positive for dysphoric mood. The patient is nervous/anxious.   All other systems reviewed and are negative.      Objective:   Physical Exam  Constitutional: He is oriented to person, place, and time. He appears well-developed and well-nourished.  HENT:  Head: Normocephalic and atraumatic.  Neck: Normal range of motion. Neck supple.  Cardiovascular: Normal rate and regular rhythm.   Pulmonary/Chest: Effort normal and breath sounds normal.  Musculoskeletal:  Normal Muscle Bulk and Muscle Testing Reveals: Upper Extremities: Full ROM and Muscle Strength 5/5 Thoracic Paraspinal Tenderness: T-7- T-12 Lumbar Paraspinal Tightness: L-3- L-4 Lower Extremities: Full ROM and Muscle Strength 5/5 Right Lower Extremity Flexion popping sound noted  Arises from chair with ease Narrow Based Gait  Neurological: He is alert and oriented to person, place, and time.  Skin: Skin is  warm and dry.  Psychiatric: He has a normal mood and affect.  Nursing note and vitals reviewed.         Assessment & Plan:  1. Cervical postlaminectomy syndrome, Hx of ACDF C4-5, C6-7, C7-T1, 02/2008. Continue current medication and alternate using heat and ice therapy.  2. Spondylosis of L-spine . Continue current treatment regime.  3. Chronic postoperative pain:  Refilled: Opana 10 mg one tablet twice a day #60 and Oxycodone 5/325mg  one tablet twice a day #60  4. 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 Continue Gabapentin, Keep blood sugars under tight control. Continue to monitor.  5. Thoracic Radiculopathy: Continue Gabapentin. He's considering an ESI with Lidocaine only due to allergies. Will discuss with Dr. Wynn Banker next month.  20 minutes of face to face patient care time was spent during this visit. All questions were encouraged and answered.  F/U in 1 month

## 2015-02-09 LAB — PMP ALCOHOL METABOLITE (ETG): Ethyl Glucuronide (EtG): NEGATIVE ng/mL

## 2015-02-15 LAB — BENZODIAZEPINES (GC/LC/MS), URINE
Alprazolam metabolite (GC/LC/MS), ur confirm: NEGATIVE ng/mL (ref <25)
Clonazepam metabolite (GC/LC/MS), ur confirm: NEGATIVE ng/mL (ref <25)
Flurazepam metabolite (GC/LC/MS), ur confirm: NEGATIVE ng/mL (ref <50)
LORAZEPAMU: NEGATIVE ng/mL (ref <50)
Midazolam (GC/LC/MS), ur confirm: NEGATIVE ng/mL (ref <50)
Nordiazepam (GC/LC/MS), ur confirm: 102 ng/mL (ref <50)
Oxazepam (GC/LC/MS), ur confirm: 557 ng/mL (ref <50)
TEMAZEPAMU: 288 ng/mL (ref <50)
Triazolam metabolite (GC/LC/MS), ur confirm: NEGATIVE ng/mL (ref <50)

## 2015-02-15 LAB — OPIATES/OPIOIDS (LC/MS-MS)
CODEINE URINE: NEGATIVE ng/mL (ref <50)
HYDROCODONE: NEGATIVE ng/mL (ref <50)
HYDROMORPHONE: NEGATIVE ng/mL (ref <50)
Morphine Urine: NEGATIVE ng/mL (ref <50)
NORHYDROCODONE, UR: NEGATIVE ng/mL (ref <50)
Noroxycodone, Ur: 1697 ng/mL (ref <50)
Oxycodone, ur: 829 ng/mL (ref <50)
Oxymorphone: 22581 ng/mL (ref <50)

## 2015-02-15 LAB — PRESCRIPTION MONITORING PROFILE (SOLSTAS)
Barbiturate Screen, Urine: NEGATIVE ng/mL
Buprenorphine, Urine: NEGATIVE ng/mL
CANNABINOID SCRN UR: NEGATIVE ng/mL
CARISOPRODOL, URINE: NEGATIVE ng/mL
COCAINE METABOLITES: NEGATIVE ng/mL
Creatinine, Urine: 408.81 mg/dL (ref >20.0)
Fentanyl, Ur: NEGATIVE ng/mL
MDMA URINE: NEGATIVE ng/mL
Meperidine, Ur: NEGATIVE ng/mL
Methadone Screen, Urine: NEGATIVE ng/mL
Nitrites, Initial: NEGATIVE ug/mL
Propoxyphene: NEGATIVE ng/mL
TAPENTADOLUR: NEGATIVE ng/mL
Tramadol Scrn, Ur: NEGATIVE ng/mL
ZOLPIDEM, URINE: NEGATIVE ng/mL
pH, Initial: 5.5 pH (ref 4.5–8.9)

## 2015-02-15 LAB — OXYCODONE, URINE (LC/MS-MS)
NOROXYCODONE, UR: 1697 ng/mL (ref <50)
Oxycodone, ur: 829 ng/mL (ref <50)
Oxymorphone: 22581 ng/mL (ref <50)

## 2015-02-15 LAB — AMPHETAMINES (GC/LC/MS), URINE
AMPHETAMINE CONF, UR: NEGATIVE ng/mL (ref <250)
Methamphetamine Quant, Ur: NEGATIVE ng/mL (ref <250)

## 2015-02-20 NOTE — Progress Notes (Signed)
Urine drug screen for this encounter is consistent for prescribed medication 

## 2015-02-28 ENCOUNTER — Other Ambulatory Visit: Payer: Self-pay | Admitting: *Deleted

## 2015-02-28 MED ORDER — GABAPENTIN 100 MG PO CAPS: 100.0000 mg | ORAL_CAPSULE | Freq: Every day | ORAL | Status: DC

## 2015-02-28 NOTE — Telephone Encounter (Signed)
Error - duplicate

## 2015-03-14 ENCOUNTER — Ambulatory Visit (HOSPITAL_BASED_OUTPATIENT_CLINIC_OR_DEPARTMENT_OTHER): Payer: BLUE CROSS/BLUE SHIELD | Admitting: Physical Medicine & Rehabilitation

## 2015-03-14 ENCOUNTER — Encounter: Payer: Self-pay | Admitting: Physical Medicine & Rehabilitation

## 2015-03-14 ENCOUNTER — Encounter: Payer: BLUE CROSS/BLUE SHIELD | Attending: Physical Medicine & Rehabilitation

## 2015-03-14 VITALS — BP 125/67 | HR 97 | Resp 14

## 2015-03-14 DIAGNOSIS — M7918 Myalgia, other site: Secondary | ICD-10-CM

## 2015-03-14 DIAGNOSIS — E119 Type 2 diabetes mellitus without complications: Secondary | ICD-10-CM | POA: Diagnosis not present

## 2015-03-14 DIAGNOSIS — M5134 Other intervertebral disc degeneration, thoracic region: Secondary | ICD-10-CM | POA: Insufficient documentation

## 2015-03-14 DIAGNOSIS — M5414 Radiculopathy, thoracic region: Secondary | ICD-10-CM | POA: Diagnosis not present

## 2015-03-14 DIAGNOSIS — M179 Osteoarthritis of knee, unspecified: Secondary | ICD-10-CM | POA: Insufficient documentation

## 2015-03-14 DIAGNOSIS — M961 Postlaminectomy syndrome, not elsewhere classified: Secondary | ICD-10-CM

## 2015-03-14 DIAGNOSIS — M5489 Other dorsalgia: Secondary | ICD-10-CM | POA: Diagnosis not present

## 2015-03-14 DIAGNOSIS — M47896 Other spondylosis, lumbar region: Secondary | ICD-10-CM | POA: Insufficient documentation

## 2015-03-14 DIAGNOSIS — Z760 Encounter for issue of repeat prescription: Secondary | ICD-10-CM | POA: Insufficient documentation

## 2015-03-14 DIAGNOSIS — G8929 Other chronic pain: Secondary | ICD-10-CM | POA: Insufficient documentation

## 2015-03-14 DIAGNOSIS — M79669 Pain in unspecified lower leg: Secondary | ICD-10-CM | POA: Insufficient documentation

## 2015-03-14 DIAGNOSIS — M5430 Sciatica, unspecified side: Secondary | ICD-10-CM | POA: Insufficient documentation

## 2015-03-14 DIAGNOSIS — M5135 Other intervertebral disc degeneration, thoracolumbar region: Secondary | ICD-10-CM | POA: Insufficient documentation

## 2015-03-14 DIAGNOSIS — M797 Fibromyalgia: Secondary | ICD-10-CM

## 2015-03-14 MED ORDER — OXYCODONE-ACETAMINOPHEN 5-325 MG PO TABS: 1.0000 | ORAL_TABLET | Freq: Two times a day (BID) | ORAL | Status: DC

## 2015-03-14 MED ORDER — OXYMORPHONE HCL ER 10 MG PO TB12: 10.0000 mg | ORAL_TABLET | Freq: Two times a day (BID) | ORAL | Status: DC

## 2015-03-14 NOTE — Progress Notes (Signed)
Subjective:    Patient ID: David [name], male    DOB: 1976/09/29, 39 y.o.   MRN: [id number]  HPI The patient complains about chronicneck pain which radiates into his left arm and left chest. The patient also complains about numbness and tingling in the same areas. The patient also complains about LBP, which radiates into both posterior legs.He also complains about radiating numbness and tingling in the same distribution. Hx of ACDF C4-5, C6-7, C7-T1,02/2008  The problem has been stable. No constipation  Increased walking 8-10 blocks once or twice a day. 20 minutes of exercise light weights, Work schedule has increased so weightlifting is not as consistent as it was before. Still doing stretching on a daily basis. This is both for back pain and neck pain  Has a sensation of shooting pains from his neck down to his feet when he looks downward.. No recent falls or trauma. No numbness in the hands, no falls. No bowel or bladder issues No weakness in arms or legs Some weakness and low back area Pain Inventory Average Pain 5 Pain Right Now 7 My pain is intermittent, constant, sharp, burning, dull, stabbing, tingling and aching  In the last 24 hours, has pain interfered with the following? General activity 6 Relation with others 6 Enjoyment of life 7 What TIME of day is your pain at its worst? all Sleep (in general) Fair  Pain is worse with: walking, bending, sitting, inactivity, standing and some activites Pain improves with: rest, heat/ice, therapy/exercise, pacing activities and medication Relief from Meds: 5  Mobility walk without assistance how many minutes can you walk? 60 ability to climb steps?  yes do you drive?  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  no  Physicians involved in your care Any changes since last visit?  no   Family  History  Problem Relation Age of Onset  . Arthritis Mother   . Heart disease Mother   . Diabetes Mother   . Heart disease Father   . Diabetes Father    History   Social History  . Marital Status: Married    Spouse Name: N/A  . Number of Children: N/A  . Years of Education: N/A   Social History Main Topics  . Smoking status: Never Smoker   . Smokeless tobacco: Never Used  . Alcohol Use: No  . Drug Use: No  . Sexual Activity: Not on file   Other Topics Concern  . None   Social History Narrative   History reviewed. No pertinent past surgical history. Past Medical History  Diagnosis Date  . Chronic pain syndrome   . Postlaminectomy syndrome, cervical region   . 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 Thoracic radiculopathy   . Lumbago   . Sciatica   . Primary localized osteoarthrosis, lower leg   . Pain in joint, lower leg    BP 125/67 mmHg  Pulse 97  Resp 14  SpO2 99%  Opioid Risk Score:   Fall Risk Score: Low Fall Risk (0-5 points)`1  Depression screen PHQ 2/9  Depression screen PHQ 2/9 01/11/2015  Decreased Interest 0  Down, Depressed, Hopeless 1  PHQ - 2 Score 1  Altered sleeping 1  Tired, decreased energy 1  Change in appetite 0  Feeling bad or failure about yourself  0  Trouble concentrating 0  Moving slowly  or fidgety/restless 0  Suicidal thoughts 0  PHQ-9 Score 3     Review of Systems  Neurological: Positive for numbness.       Tingling Spasms   Psychiatric/Behavioral: Positive for dysphoric mood. The patient is nervous/anxious.   All other systems reviewed and are negative.      Objective:   Physical Exam  Positive Lhermitte's sign Motor strength is 5/5 bilateral deltoid, biceps, triceps, grip, hip flexion, knee extension, ankle dorsiflexion and plantarflexion Sensation intact bilateral C5 C6 C7 C8 dermatomal distribution No evidence of spasticity Gait is normal Back his normal spinal flexion and  extension Neck range of motion is reduced extension lateral bending full flexion  Tenderness to palpation in the right upper trapezius as well as rhomboids on the right side      Assessment & Plan:  1. Cervical postlaminectomy syndrome, Hx of ACDF C4-5, C6-7, C7-T1, 02/2008. Positive paresthesias with downward gaze, question if developing some C3-4 spondylolisthesis we'll check x-rays with flexion-extension views 2. Spondylosis of L-spine  3. Chronic postoperative pain we'll continue current medications which consist of Opana ER 10 mg twice a day and oxycodone 5 mg up to twice a day. Should continue his walking program and light weight lifting program, ,No signs of aberrant drug behavior. NP followup visit in one month continue random urine drug screens and pill count  Patient denies any constipation. No other side effects of medications.  Weight has been going down.  UDS consistent 4. 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 Myofascial pain increased now that work schedule has gone up, not exercising as much. Recommend theraband exercises with rowing-type exercises as well as arm pull downs. Instructions have been given demonstration given. Also pectoralis stretching exercises  We'll also do trigger point injection to the right upper trap as well as right rhomboids Trigger Point Injection  Indication: Right trapezius and right rhomboid Myofascial pain not relieved by medication management and other conservative care.  Informed consent was obtained after describing risk and benefits of the procedure with the patient, this includes bleeding, bruising, infection and medication side effects.  The patient wishes to proceed and has given written consent.  The patient was placed in a Seated position.  The Right upper trapezius and right rhomboid area was marked and prepped with Betadine.  It was entered with a 25-gauge 1-1/2 inch needle and 1 mL of 1% lidocaine was  injected into each of 6 trigger points, after negative draw back for blood.  Positive twitch sign seen at the right rhomboidThe patient tolerated the procedure well.  Post procedure instructions were given.

## 2015-03-14 NOTE — Patient Instructions (Signed)

## 2015-03-29 ENCOUNTER — Ambulatory Visit
Admission: RE | Admit: 2015-03-29 | Discharge: 2015-03-29 | Disposition: A | Discharge status: Home / Self Care | Payer: BLUE CROSS/BLUE SHIELD | Source: Ambulatory Visit | Attending: Physical Medicine & Rehabilitation | Admitting: Physical Medicine & Rehabilitation

## 2015-03-29 DIAGNOSIS — M961 Postlaminectomy syndrome, not elsewhere classified: Secondary | ICD-10-CM

## 2015-04-15 ENCOUNTER — Encounter: Payer: BLUE CROSS/BLUE SHIELD | Attending: Physical Medicine & Rehabilitation

## 2015-04-15 ENCOUNTER — Ambulatory Visit (HOSPITAL_BASED_OUTPATIENT_CLINIC_OR_DEPARTMENT_OTHER): Payer: BLUE CROSS/BLUE SHIELD | Admitting: Physical Medicine & Rehabilitation

## 2015-04-15 ENCOUNTER — Encounter: Payer: Self-pay | Admitting: Physical Medicine & Rehabilitation

## 2015-04-15 VITALS — BP 152/72 | HR 115 | Resp 16

## 2015-04-15 DIAGNOSIS — M5414 Radiculopathy, thoracic region: Secondary | ICD-10-CM | POA: Diagnosis not present

## 2015-04-15 DIAGNOSIS — M797 Fibromyalgia: Secondary | ICD-10-CM | POA: Diagnosis not present

## 2015-04-15 DIAGNOSIS — G8929 Other chronic pain: Secondary | ICD-10-CM | POA: Diagnosis not present

## 2015-04-15 DIAGNOSIS — M179 Osteoarthritis of knee, unspecified: Secondary | ICD-10-CM | POA: Diagnosis not present

## 2015-04-15 DIAGNOSIS — M79669 Pain in unspecified lower leg: Secondary | ICD-10-CM | POA: Insufficient documentation

## 2015-04-15 DIAGNOSIS — M961 Postlaminectomy syndrome, not elsewhere classified: Secondary | ICD-10-CM

## 2015-04-15 DIAGNOSIS — M5489 Other dorsalgia: Secondary | ICD-10-CM | POA: Diagnosis not present

## 2015-04-15 DIAGNOSIS — E119 Type 2 diabetes mellitus without complications: Secondary | ICD-10-CM | POA: Insufficient documentation

## 2015-04-15 DIAGNOSIS — M47896 Other spondylosis, lumbar region: Secondary | ICD-10-CM | POA: Diagnosis not present

## 2015-04-15 DIAGNOSIS — G8928 Other chronic postprocedural pain: Secondary | ICD-10-CM | POA: Diagnosis not present

## 2015-04-15 DIAGNOSIS — M7918 Myalgia, other site: Secondary | ICD-10-CM

## 2015-04-15 DIAGNOSIS — M5135 Other intervertebral disc degeneration, thoracolumbar region: Secondary | ICD-10-CM | POA: Insufficient documentation

## 2015-04-15 DIAGNOSIS — Z760 Encounter for issue of repeat prescription: Secondary | ICD-10-CM | POA: Diagnosis not present

## 2015-04-15 DIAGNOSIS — M5430 Sciatica, unspecified side: Secondary | ICD-10-CM | POA: Diagnosis not present

## 2015-04-15 DIAGNOSIS — M5134 Other intervertebral disc degeneration, thoracic region: Secondary | ICD-10-CM | POA: Insufficient documentation

## 2015-04-15 MED ORDER — OXYMORPHONE HCL ER 10 MG PO TB12: 10.0000 mg | ORAL_TABLET | Freq: Two times a day (BID) | ORAL | Status: DC

## 2015-04-15 MED ORDER — OXYCODONE-ACETAMINOPHEN 5-325 MG PO TABS: 1.0000 | ORAL_TABLET | Freq: Two times a day (BID) | ORAL | Status: DC

## 2015-04-15 NOTE — Progress Notes (Signed)
Subjective:    Patient ID: David [name], male    DOB: [birth year], 39 y.o.   MRN: [id number]  HPI  Feels jolt going down thigh  Requesting trigger point injections and to have his x rays reviewed today. Pain Inventory Average Pain 5 Pain Right Now 5 My pain is intermittent, constant, sharp, burning, dull, stabbing, tingling and aching  In the last 24 hours, has pain interfered with the following? General activity 5 Relation with others 5 Enjoyment of life 6 What TIME of day is your pain at its worst? all Sleep (in general) Fair  Pain is worse with: walking, bending, sitting, inactivity, standing and some activites Pain improves with: rest, heat/ice, therapy/exercise, pacing activities, medication and injections Relief from Meds: 5  Mobility walk without assistance how many minutes can you walk? 60 ability to climb steps?  yes do you drive?  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  yes x-rays  Physicians involved in your care Any changes since last visit?  no   Family History  Problem Relation Age of Onset  . Arthritis Mother   . Heart disease Mother   . Diabetes Mother   . Heart disease Father   . Diabetes Father    History   Social History  . Marital Status: Married    Spouse Name: N/A  . Number of Children: N/A  . Years of Education: N/A   Social History Main Topics  . Smoking status: Never Smoker   . Smokeless tobacco: Never Used  . Alcohol Use: No  . Drug Use: No  . Sexual Activity: Not on file   Other Topics Concern  . None   Social History Narrative   History reviewed. No pertinent past surgical history. Past Medical History  Diagnosis Date  . Chronic pain syndrome   . Postlaminectomy syndrome, cervical region   . 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 Thoracic radiculopathy    . Lumbago   . Sciatica   . Primary localized osteoarthrosis, lower leg   . Pain in joint, lower leg    BP 152/72 mmHg  Pulse 115  Resp 16  SpO2 98%  Opioid Risk Score:   Fall Risk Score: Low Fall Risk (0-5 points)`1  Depression screen PHQ 2/9  Depression screen PHQ 2/9 01/11/2015  Decreased Interest 0  Down, Depressed, Hopeless 1  PHQ - 2 Score 1  Altered sleeping 1  Tired, decreased energy 1  Change in appetite 0  Feeling bad or failure about yourself  0  Trouble concentrating 0  Moving slowly or fidgety/restless 0  Suicidal thoughts 0  PHQ-9 Score 3      Review of Systems  Musculoskeletal:       Spasms  Neurological: Positive for numbness.       Tingling  Psychiatric/Behavioral: Positive for dysphoric mood. The patient is nervous/anxious.   All other systems reviewed and are negative.      Objective:   Physical Exam  Constitutional: He is oriented to person, place, and time. He appears well-developed and well-nourished.  HENT:  Head: Normocephalic and atraumatic.  Eyes: Conjunctivae and EOM are normal. Pupils are equal, round, and reactive to light.  Neurological: He is alert and oriented to person, place, and time. He has normal reflexes.  Psychiatric: He has a normal mood and affect. His behavior is normal.  Nursing note and vitals reviewed.   Tenderness palpation bilateral T7 paraspinal as well as left medial scapula lateral to T7 as well as left medial scapula/rhomboid area lateral to T4  Motor strength is 5/5 bilateral deltoids, biceps, triceps, grip, hip flexor, knee extensor ankle dorsi flexion plan flexion Cervical range of motion has full flexion and extension lateral bending is 50-75% bilaterally. Rotation is 100% bilaterally lumbar range of motion is normal Negative straight leg raising Negative Spurling's maneuver Sensation intact to light touch bilateral upper and lower limbs      Assessment & Plan:  1. Cervical postlaminectomy syndrome  status post C4-5 and C6-7 fusion. He does have a nonfused area with degenerative changes, spondylosis as well as loss of disc space at C5-6 however this area does not seem to be as tender or painful as the area below the fusion as well as the area above the fusion. He does not have any neurologic signs therefore I do not think any advanced imaging such as MRI or CT myelogram are needed at this time. Should he have some increasing radicular symptoms we may consider this. His flexion-extension views are normal. He does have some electrical sensation when he looks down from his neck down to his feet. Patient does not want to pursue neurosurgical referral at the turn time. He is concerned about his deductible which should be met soon.  Continue To monitor closely see him on a monthly basis,   2. Cervical thoracic myofascial pain syndrome relieved with trigger point injections on the right side we'll do trigger point injections on the left side as noted above  Trigger Point Injection  Indication: Cervicothoracic Myofascial pain not relieved by medication management and other conservative care.  Informed consent was obtained after describing risk and benefits of the procedure with the patient, this includes bleeding, bruising, infection and medication side effects.  The patient wishes to proceed and has given written consent.  The patient was placed in a seated position.  The Left rhomboid insertion T4 and T7 at the medial scapular border as well as bilateral T7 paraspinal muscle area was marked and prepped with Betadine.  It was entered with a 25-gauge 1-1/2 inch needle and 1 mL of 1% lidocaine was injected into each of 4 trigger points, after negative draw back for blood.  The patient tolerated the procedure well.  Post procedure instructions were given.

## 2015-04-15 NOTE — Patient Instructions (Signed)
Trigger Point Injection Trigger points are areas where you have muscle pain. A trigger point injection is a shot given in the trigger point to relieve that pain. A trigger point might feel like a knot in your muscle. It hurts to press on a trigger point. Sometimes the pain spreads out (radiates) to other parts of the body. For example, pressing on a trigger point in your shoulder might cause pain in your arm or neck. You might have one trigger point. Or, you might have more than one. People often have trigger points in their upper back and lower back. They also occur often in the neck and shoulders. Pain from a trigger point lasts for a long time. It can make it hard to keep moving. You might not be able to do the exercise or physical therapy that could help you deal with the pain. A trigger point injection may help. It does not work for everyone. But, it may relieve your pain for a few days or a few months. A trigger point injection does not cure long-lasting (chronic) pain. LET YOUR CAREGIVER KNOW ABOUT:  Any allergies (especially to latex, lidocaine, or steroids).  Blood-thinning medicines that you take. These drugs can lead to bleeding or bruising after an injection. They include:  Aspirin.  Ibuprofen.  Clopidogrel.  Warfarin.  Other medicines you take. This includes all vitamins, herbs, eyedrops, over-the-counter medicines, and creams.  Use of steroids.  Recent infections.  Past problems with numbing medicines.  Bleeding problems.  Surgeries you have had.  Other health problems. RISKS AND COMPLICATIONS A trigger point injection is a safe treatment. However, problems may develop, such as:  Minor side effects usually go away in 1 to 2 days. These may include:  Soreness.  Bruising.  Stiffness.  More serious problems are rare. But, they may include:  Bleeding under the skin (hematoma).  Skin infection.  Breaking off of the needle under your skin.  Lung  puncture.  The trigger point injection may not work for you. BEFORE THE PROCEDURE You may need to stop taking any medicine that thins your blood. This is to prevent bleeding and bruising. Usually these medicines are stopped several days before the injection. No other preparation is needed. PROCEDURE  A trigger point injection can be given in your caregiver's office or in a clinic. Each injection takes 2 minutes or less.  Your caregiver will feel for trigger points. The caregiver may use a marker to circle the area for the injection.  The skin over the trigger point will be washed with a germ-killing (antiseptic) solution.  The caregiver pinches the spot for the injection.  Then, a very thin needle is used for the shot. You may feel pain or a twitching feeling when the needle enters the trigger point.  A numbing solution may be injected into the trigger point. Sometimes a drug to keep down swelling, redness, and warmth (inflammation) is also injected.  Your caregiver moves the needle around the trigger zone until the tightness and twitching goes away.  After the injection, your caregiver may put gentle pressure over the injection site.  Then it is covered with a bandage. AFTER THE PROCEDURE  You can go right home after the injection.  The bandage can be taken off after a few hours.  You may feel sore and stiff for 1 to 2 days.  Go back to your regular activities slowly. Your caregiver may ask you to stretch your muscles. Do not do anything that takes   extra energy for a few days.  Follow your caregiver's instructions to manage and treat other pain. Document Released: 10/08/2011 Document Revised: 02/13/2013 Document Reviewed: 10/08/2011 [employer] Patient Information 2015 [employer]. This information is not intended to replace advice given to you by your health care provider. Make sure you discuss any questions you have with your health care provider.  

## 2015-05-14 ENCOUNTER — Encounter: Payer: Self-pay | Admitting: Physical Medicine & Rehabilitation

## 2015-05-14 ENCOUNTER — Ambulatory Visit (HOSPITAL_BASED_OUTPATIENT_CLINIC_OR_DEPARTMENT_OTHER): Payer: BLUE CROSS/BLUE SHIELD | Admitting: Physical Medicine & Rehabilitation

## 2015-05-14 ENCOUNTER — Encounter: Payer: BLUE CROSS/BLUE SHIELD | Attending: Physical Medicine & Rehabilitation

## 2015-05-14 VITALS — BP 121/70 | HR 102 | Resp 14

## 2015-05-14 DIAGNOSIS — M961 Postlaminectomy syndrome, not elsewhere classified: Secondary | ICD-10-CM | POA: Insufficient documentation

## 2015-05-14 DIAGNOSIS — M797 Fibromyalgia: Secondary | ICD-10-CM

## 2015-05-14 DIAGNOSIS — M5489 Other dorsalgia: Secondary | ICD-10-CM | POA: Insufficient documentation

## 2015-05-14 DIAGNOSIS — G8929 Other chronic pain: Secondary | ICD-10-CM | POA: Diagnosis not present

## 2015-05-14 DIAGNOSIS — Z760 Encounter for issue of repeat prescription: Secondary | ICD-10-CM | POA: Diagnosis present

## 2015-05-14 DIAGNOSIS — M5135 Other intervertebral disc degeneration, thoracolumbar region: Secondary | ICD-10-CM | POA: Insufficient documentation

## 2015-05-14 DIAGNOSIS — E119 Type 2 diabetes mellitus without complications: Secondary | ICD-10-CM | POA: Insufficient documentation

## 2015-05-14 DIAGNOSIS — M79669 Pain in unspecified lower leg: Secondary | ICD-10-CM | POA: Diagnosis not present

## 2015-05-14 DIAGNOSIS — M5414 Radiculopathy, thoracic region: Secondary | ICD-10-CM | POA: Insufficient documentation

## 2015-05-14 DIAGNOSIS — M5134 Other intervertebral disc degeneration, thoracic region: Secondary | ICD-10-CM | POA: Insufficient documentation

## 2015-05-14 DIAGNOSIS — M7918 Myalgia, other site: Secondary | ICD-10-CM

## 2015-05-14 DIAGNOSIS — M5430 Sciatica, unspecified side: Secondary | ICD-10-CM | POA: Insufficient documentation

## 2015-05-14 DIAGNOSIS — M179 Osteoarthritis of knee, unspecified: Secondary | ICD-10-CM | POA: Insufficient documentation

## 2015-05-14 DIAGNOSIS — M47896 Other spondylosis, lumbar region: Secondary | ICD-10-CM | POA: Diagnosis not present

## 2015-05-14 MED ORDER — OXYMORPHONE HCL ER 10 MG PO TB12: 10.0000 mg | ORAL_TABLET | Freq: Two times a day (BID) | ORAL | Status: DC

## 2015-05-14 MED ORDER — OXYCODONE-ACETAMINOPHEN 5-325 MG PO TABS: 1.0000 | ORAL_TABLET | Freq: Two times a day (BID) | ORAL | Status: DC

## 2015-05-14 NOTE — Progress Notes (Signed)
Subjective:    Patient ID: David LeedsJohn O [name], male    DOB: 10-18-1976, 39 y.o.   MRN: [id number]  HPI Doing pec stretching exercise Poor sleep in the last week, stressed out with work and family, ~4hr per nite sleep High humidity also increases pain  Shooting pain and electrical shock sensation, reduced since trigger point injections on the right side. Right-sided trapezius and rhomboid injection were helpful, these were performed about 2 months ago. The left-sided thoracic paraspinal, rhomboid injections were not helpful.  Still stretching and light weights at night  Not walking as much Pain Inventory Average Pain 5 Pain Right Now 6 My pain is intermittent, constant, sharp, burning, dull, stabbing, tingling and aching  In the last 24 hours, has pain interfered with the following? General activity 6 Relation with others 6 Enjoyment of life 6 What TIME of day is your pain at its worst? all Sleep (in general) Fair  Pain is worse with: walking, bending, sitting, inactivity, standing and some activites Pain improves with: rest, heat/ice, therapy/exercise, pacing activities, medication and injections Relief from Meds: 5  Mobility walk without assistance how many minutes can you walk? 60 ability to climb steps?  yes do you drive?  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  no  Physicians involved in your care Any changes since last visit?  no   Family History  Problem Relation Age of Onset  . Arthritis Mother   . Heart disease Mother   . Diabetes Mother   . Heart disease Father   . Diabetes Father    History   Social History  . Marital Status: Married    Spouse Name: N/A  . Number of Children: N/A  . Years of Education: N/A   Social History Main Topics  . Smoking status: Never Smoker   . Smokeless tobacco: Never Used  .  Alcohol Use: No  . Drug Use: No  . Sexual Activity: Not on file   Other Topics Concern  . None   Social History Narrative   History reviewed. No pertinent past surgical history. Past Medical History  Diagnosis Date  . Chronic pain syndrome   . Postlaminectomy syndrome, cervical region   . 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 Thoracic radiculopathy   . Lumbago   . Sciatica   . Primary localized osteoarthrosis, lower leg   . Pain in joint, lower leg    BP 121/70 mmHg  Pulse 102  Resp 14  SpO2 98%  Opioid Risk Score:   Fall Risk Score: Low Fall Risk (0-5 points)`1  Depression screen PHQ 2/9  Depression screen PHQ 2/9 01/11/2015  Decreased Interest 0  Down, Depressed, Hopeless 1  PHQ - 2 Score 1  Altered sleeping 1  Tired, decreased energy 1  Change in appetite 0  Feeling bad or failure about yourself  0  Trouble concentrating 0  Moving slowly or fidgety/restless 0  Suicidal thoughts 0  PHQ-9 Score 3     Review of Systems  Neurological: Positive for numbness.       Tingling spasms  Psychiatric/Behavioral: Positive for dysphoric mood. The patient is nervous/anxious.   All other systems reviewed and are negative.      Objective:   Physical Exam  Constitutional: He is oriented to person, place, and time. He appears well-developed and well-nourished.  HENT:  Head:  Normocephalic and atraumatic.  Eyes: Conjunctivae and EOM are normal. Pupils are equal, round, and reactive to light.  Neck: Normal range of motion.  Neurological: He is alert and oriented to person, place, and time.  Nursing note and vitals reviewed.  Motor strength is 5/5 bilateral deltoid, biceps, triceps, grip, hip flexor, knee extensor, ankle dorsiflexor and plantar flexor Back has no tenderness to palpation in the upper trapezius or thoracic paraspinal muscles. He complains of tightness but no tenderness. No evidence of spinal deformity although he does have mild  head forward posture.       Assessment & Plan:  1. Cervical postlaminectomy syndrome-Status post C4-5 and C6-C7 fusion, recent x-rays showed no instability with flexion and extension. He may have some facet mediated pain at C7-T1 or at C5-C6 would consider medial branch blocks if pain progresses. Currently patient has no cervical radicular symptoms.  Continue Opana ER 10 mg twice a day Continue Percocet 5/325 one by mouth twice a day  No signs of opioid misuse, denies any side effects such as constipation Continue opioid monitoring program. This consists of regular clinic visits, examinations, urine drug screen, pill counts as well as use of [place] controlled substance reporting System.  2. Myofascial pain syndrome I think this is likely due to sedentary nature of work, some postural issues as well, massage therapy and postural training would be beneficial

## 2015-05-14 NOTE — Patient Instructions (Signed)
[facility]

## 2015-06-13 ENCOUNTER — Encounter: Payer: BLUE CROSS/BLUE SHIELD | Attending: Physical Medicine & Rehabilitation | Admitting: Registered Nurse

## 2015-06-13 ENCOUNTER — Encounter: Payer: Self-pay | Admitting: Registered Nurse

## 2015-06-13 VITALS — BP 129/71 | HR 116

## 2015-06-13 DIAGNOSIS — M961 Postlaminectomy syndrome, not elsewhere classified: Secondary | ICD-10-CM | POA: Insufficient documentation

## 2015-06-13 DIAGNOSIS — M5135 Other intervertebral disc degeneration, thoracolumbar region: Secondary | ICD-10-CM | POA: Insufficient documentation

## 2015-06-13 DIAGNOSIS — Z79899 Other long term (current) drug therapy: Secondary | ICD-10-CM

## 2015-06-13 DIAGNOSIS — M179 Osteoarthritis of knee, unspecified: Secondary | ICD-10-CM | POA: Insufficient documentation

## 2015-06-13 DIAGNOSIS — Z760 Encounter for issue of repeat prescription: Secondary | ICD-10-CM | POA: Diagnosis not present

## 2015-06-13 DIAGNOSIS — M5430 Sciatica, unspecified side: Secondary | ICD-10-CM | POA: Diagnosis not present

## 2015-06-13 DIAGNOSIS — E119 Type 2 diabetes mellitus without complications: Secondary | ICD-10-CM | POA: Diagnosis not present

## 2015-06-13 DIAGNOSIS — G894 Chronic pain syndrome: Secondary | ICD-10-CM

## 2015-06-13 DIAGNOSIS — M5489 Other dorsalgia: Secondary | ICD-10-CM | POA: Insufficient documentation

## 2015-06-13 DIAGNOSIS — G8929 Other chronic pain: Secondary | ICD-10-CM | POA: Insufficient documentation

## 2015-06-13 DIAGNOSIS — Z5181 Encounter for therapeutic drug level monitoring: Secondary | ICD-10-CM | POA: Diagnosis not present

## 2015-06-13 DIAGNOSIS — M797 Fibromyalgia: Secondary | ICD-10-CM

## 2015-06-13 DIAGNOSIS — M5134 Other intervertebral disc degeneration, thoracic region: Secondary | ICD-10-CM | POA: Insufficient documentation

## 2015-06-13 DIAGNOSIS — M79669 Pain in unspecified lower leg: Secondary | ICD-10-CM | POA: Diagnosis not present

## 2015-06-13 DIAGNOSIS — M5414 Radiculopathy, thoracic region: Secondary | ICD-10-CM | POA: Insufficient documentation

## 2015-06-13 DIAGNOSIS — M47896 Other spondylosis, lumbar region: Secondary | ICD-10-CM | POA: Diagnosis not present

## 2015-06-13 DIAGNOSIS — M7918 Myalgia, other site: Secondary | ICD-10-CM

## 2015-06-13 MED ORDER — GABAPENTIN 400 MG PO CAPS: 400.0000 mg | ORAL_CAPSULE | Freq: Four times a day (QID) | ORAL | Status: DC

## 2015-06-13 MED ORDER — OPANA ER 10 MG PO T12A: 10.0000 mg | EXTENDED_RELEASE_TABLET | Freq: Two times a day (BID) | ORAL | Status: DC

## 2015-06-13 MED ORDER — OXYCODONE-ACETAMINOPHEN 5-325 MG PO TABS: 1.0000 | ORAL_TABLET | Freq: Two times a day (BID) | ORAL | Status: DC

## 2015-06-13 NOTE — Progress Notes (Deleted)
   Subjective:    Patient ID: David [name], male    DOB: June 12, 1976, 39 y.o.   MRN: [id number]  HPI    Review of Systems     Objective:   Physical Exam        Assessment & Plan:   .

## 2015-06-13 NOTE — Progress Notes (Signed)
Subjective:    Patient ID: David [name], male    DOB: 1976-09-24, 39 y.o.   MRN: [id number]  HPI: David [name] is a 39 year old male who returns for follow up for chronic pain and medication refill.He states his pain is located in his neck and upper- mid back and lower back.He rates his pain 5.His current exercise regime is walking and performing his stretching exercises.  Arrived tachycardic apical pulse checked 112.  Pain Inventory Average Pain 5 Pain Right Now 5 My pain is intermittent, constant, sharp, burning, dull, stabbing, tingling and aching  In the last 24 hours, has pain interfered with the following? General activity 5 Relation with others 6 Enjoyment of life 6 What TIME of day is your pain at its worst? all Sleep (in general) Fair  Pain is worse with: walking, bending, sitting, inactivity, standing and some activites Pain improves with: rest, heat/ice, therapy/exercise, pacing activities, medication and injections Relief from Meds: 5  Mobility walk without assistance how many minutes can you walk? 60 ability to climb steps?  yes do you drive?  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  no  Physicians involved in your care Any changes since last visit?  no   Family History  Problem Relation Age of Onset  . Arthritis Mother   . Heart disease Mother   . Diabetes Mother   . Heart disease Father   . Diabetes Father    Social History   Social History  . Marital Status: Married    Spouse Name: N/A  . Number of Children: N/A  . Years of Education: N/A   Social History Main Topics  . Smoking status: Never Smoker   . Smokeless tobacco: Never Used  . Alcohol Use: No  . Drug Use: No  . Sexual Activity: Not Asked   Other Topics Concern  . None   Social History Narrative   History reviewed. No pertinent past surgical  history. Past Medical History  Diagnosis Date  . Chronic pain syndrome   . Postlaminectomy syndrome, cervical region   . 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 Thoracic radiculopathy   . Lumbago   . Sciatica   . Primary localized osteoarthrosis, lower leg   . Pain in joint, lower leg    BP 129/71 mmHg  Pulse 116  SpO2 96%  Opioid Risk Score:   Fall Risk Score:  `1  Depression screen PHQ 2/9  Depression screen [facility] 2/9 06/13/2015 01/11/2015  Decreased Interest 0 0  Down, Depressed, Hopeless 1 1  PHQ - 2 Score 1 1  Altered sleeping - 1  Tired, decreased energy - 1  Change in appetite - 0  Feeling bad or failure about yourself  - 0  Trouble concentrating - 0  Moving slowly or fidgety/restless - 0  Suicidal thoughts - 0  PHQ-9 Score - 3     Review of Systems  Musculoskeletal:       Spasms  Neurological:       Tingling  Psychiatric/Behavioral: Positive for dysphoric mood. The patient is nervous/anxious.   All other systems reviewed and are negative.      Objective:   Physical Exam  Constitutional: He is oriented to person, place, and time. He appears well-developed and well-nourished.  HENT:  Head: Normocephalic and atraumatic.  Neck: Normal range of motion.  Neck supple.  Cardiovascular: Normal rate and regular rhythm.   Pulmonary/Chest: Effort normal and breath sounds normal.  Musculoskeletal:  Normal Muscle Bulk and Muscle Testing Reveals: Upper Extremities: Full ROM and Muscle Strength 5/5 Rhomboid Tenderness Thoracic Paraspinal Tenderness: T-10- T-12 Lower Extremities: Full ROM and Muscle Strength 5/5 Arises from chair with ease Narrow Based Gait  Neurological: He is alert and oriented to person, place, and time.  Skin: Skin is warm and dry.  Psychiatric: He has a normal mood and affect.  Nursing note and vitals reviewed.         Assessment & Plan:  1. Cervical postlaminectomy syndrome, Hx of ACDF C4-5, C6-7, C7-T1,  02/2008. Continue current medication and alternate using heat and ice therapy.  2. Spondylosis of L-spine . Continue current treatment regime.  3. Chronic postoperative pain:  Refilled: Opana 10 mg one tablet twice a day #60 and Oxycodone 5/325mg  one tablet twice a day #60  4. 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 Continue Gabapentin, Keep blood sugars under tight control. Continue to monitor.  5. Thoracic Radiculopathy: Continue Gabapentin.  We'll also do trigger point injection bilateral trapezius, rhomboids and Thoracic Paraspinal T-10. Trigger Point Injection. Dr. Wynn Banker in Attendance.  Indication: Bilateral trapezius, bilateral rhomboid and Thoracic Paraspinal  Myofascial pain not relieved by medication management and other conservative care.  Informed consent was obtained after describing risk and benefits of the procedure with the patient, this includes bleeding, bruising, infection and medication side effects. The patient wishes to proceed and has given written consent. The patient was placed in a Seated position. The Bilateral upper trapezius, right rhomboid and Thoracic Paraspinals area was marked and prepped with Betadine. It was entered with a 25-gauge 1-1/2 inch needle and 1 mL of 1% lidocaine was injected into each of 6 trigger points, after negative draw back for blood. Positive twitch sign seen at the right rhomboidThe patient tolerated the procedure well. Post procedure instructions were given.  30 minutes of face to face patient care time was spent during this visit. All questions were encouraged and answered.  F/U in 1 month

## 2015-06-14 ENCOUNTER — Other Ambulatory Visit: Payer: Self-pay | Admitting: Physical Medicine & Rehabilitation

## 2015-06-15 LAB — PMP ALCOHOL METABOLITE (ETG): Ethyl Glucuronide (EtG): NEGATIVE ng/mL

## 2015-06-20 LAB — OPIATES/OPIOIDS (LC/MS-MS)
Codeine Urine: NEGATIVE ng/mL (ref <50)
Hydrocodone: NEGATIVE ng/mL (ref <50)
Hydromorphone: NEGATIVE ng/mL (ref <50)
Morphine Urine: NEGATIVE ng/mL (ref <50)
NORHYDROCODONE, UR: NEGATIVE ng/mL (ref <50)
NOROXYCODONE, UR: 1365 ng/mL (ref <50)
Oxycodone, ur: 1911 ng/mL (ref <50)
Oxymorphone: 21427 ng/mL (ref <50)

## 2015-06-20 LAB — PRESCRIPTION MONITORING PROFILE (SOLSTAS)
Barbiturate Screen, Urine: NEGATIVE ng/mL
Buprenorphine, Urine: NEGATIVE ng/mL
COCAINE METABOLITES: NEGATIVE ng/mL
Cannabinoid Scrn, Ur: NEGATIVE ng/mL
Carisoprodol, Urine: NEGATIVE ng/mL
Creatinine, Urine: 437.26 mg/dL (ref >20.0)
Fentanyl, Ur: NEGATIVE ng/mL
MDMA URINE: NEGATIVE ng/mL
Meperidine, Ur: NEGATIVE ng/mL
Methadone Screen, Urine: NEGATIVE ng/mL
Nitrites, Initial: NEGATIVE ug/mL
PH URINE, INITIAL: 5.5 pH (ref 4.5–8.9)
PROPOXYPHENE: NEGATIVE ng/mL
TAPENTADOLUR: NEGATIVE ng/mL
Tramadol Scrn, Ur: NEGATIVE ng/mL
Zolpidem, Urine: NEGATIVE ng/mL

## 2015-06-20 LAB — OXYCODONE, URINE (LC/MS-MS)
Noroxycodone, Ur: 1365 ng/mL (ref <50)
Oxycodone, ur: 1911 ng/mL (ref <50)
Oxymorphone: 21427 ng/mL (ref <50)

## 2015-06-20 LAB — BENZODIAZEPINES (GC/LC/MS), URINE
Alprazolam metabolite (GC/LC/MS), ur confirm: NEGATIVE ng/mL (ref <25)
CLONAZEPAU: NEGATIVE ng/mL (ref <25)
Flurazepam metabolite (GC/LC/MS), ur confirm: NEGATIVE ng/mL (ref <50)
Lorazepam (GC/LC/MS), ur confirm: NEGATIVE ng/mL (ref <50)
Midazolam (GC/LC/MS), ur confirm: NEGATIVE ng/mL (ref <50)
Nordiazepam (GC/LC/MS), ur confirm: 207 ng/mL (ref <50)
Oxazepam (GC/LC/MS), ur confirm: 643 ng/mL (ref <50)
TRIAZOLAMU: NEGATIVE ng/mL (ref <50)
Temazepam (GC/LC/MS), ur confirm: 783 ng/mL (ref <50)

## 2015-06-20 LAB — AMPHETAMINES (GC/LC/MS), URINE
AMPHETAMINE CONF, UR: NEGATIVE ng/mL (ref <250)
Methamphetamine Quant, Ur: NEGATIVE ng/mL (ref <250)

## 2015-06-27 NOTE — Progress Notes (Signed)
Urine drug screen for this encounter is consistent for prescribed medication 

## 2015-08-06 ENCOUNTER — Encounter: Payer: Self-pay | Admitting: Registered Nurse

## 2015-08-06 ENCOUNTER — Encounter: Payer: BLUE CROSS/BLUE SHIELD | Attending: Physical Medicine & Rehabilitation | Admitting: Registered Nurse

## 2015-08-06 VITALS — BP 134/72 | HR 102

## 2015-08-06 DIAGNOSIS — M5489 Other dorsalgia: Secondary | ICD-10-CM | POA: Diagnosis not present

## 2015-08-06 DIAGNOSIS — M961 Postlaminectomy syndrome, not elsewhere classified: Secondary | ICD-10-CM | POA: Diagnosis not present

## 2015-08-06 DIAGNOSIS — M797 Fibromyalgia: Secondary | ICD-10-CM

## 2015-08-06 DIAGNOSIS — Z5181 Encounter for therapeutic drug level monitoring: Secondary | ICD-10-CM

## 2015-08-06 DIAGNOSIS — M47817 Spondylosis without myelopathy or radiculopathy, lumbosacral region: Secondary | ICD-10-CM

## 2015-08-06 DIAGNOSIS — M5135 Other intervertebral disc degeneration, thoracolumbar region: Secondary | ICD-10-CM | POA: Diagnosis not present

## 2015-08-06 DIAGNOSIS — M179 Osteoarthritis of knee, unspecified: Secondary | ICD-10-CM | POA: Insufficient documentation

## 2015-08-06 DIAGNOSIS — Z760 Encounter for issue of repeat prescription: Secondary | ICD-10-CM | POA: Insufficient documentation

## 2015-08-06 DIAGNOSIS — M47896 Other spondylosis, lumbar region: Secondary | ICD-10-CM | POA: Diagnosis not present

## 2015-08-06 DIAGNOSIS — M5430 Sciatica, unspecified side: Secondary | ICD-10-CM | POA: Diagnosis not present

## 2015-08-06 DIAGNOSIS — M79669 Pain in unspecified lower leg: Secondary | ICD-10-CM | POA: Diagnosis not present

## 2015-08-06 DIAGNOSIS — Z79899 Other long term (current) drug therapy: Secondary | ICD-10-CM

## 2015-08-06 DIAGNOSIS — G894 Chronic pain syndrome: Secondary | ICD-10-CM

## 2015-08-06 DIAGNOSIS — M5134 Other intervertebral disc degeneration, thoracic region: Secondary | ICD-10-CM | POA: Diagnosis not present

## 2015-08-06 DIAGNOSIS — M5414 Radiculopathy, thoracic region: Secondary | ICD-10-CM | POA: Diagnosis not present

## 2015-08-06 DIAGNOSIS — M5440 Lumbago with sciatica, unspecified side: Secondary | ICD-10-CM

## 2015-08-06 DIAGNOSIS — G8929 Other chronic pain: Secondary | ICD-10-CM | POA: Diagnosis not present

## 2015-08-06 DIAGNOSIS — M7918 Myalgia, other site: Secondary | ICD-10-CM

## 2015-08-06 DIAGNOSIS — E119 Type 2 diabetes mellitus without complications: Secondary | ICD-10-CM | POA: Diagnosis not present

## 2015-08-06 MED ORDER — OXYCODONE-ACETAMINOPHEN 5-325 MG PO TABS: 1.0000 | ORAL_TABLET | Freq: Two times a day (BID) | ORAL | Status: DC

## 2015-08-06 MED ORDER — OPANA ER 10 MG PO T12A: 10.0000 mg | EXTENDED_RELEASE_TABLET | Freq: Two times a day (BID) | ORAL | Status: DC

## 2015-08-06 MED ORDER — DICLOFENAC SODIUM 1 % TD GEL: 2.0000 g | Freq: Three times a day (TID) | TRANSDERMAL | Status: DC

## 2015-08-06 NOTE — Progress Notes (Signed)
Subjective:    Patient ID: David [name], male    DOB: 1976/05/23, 39 y.o.   MRN: [id number]  HPI: David [name] is a 39 year old male who returns for follow up for chronic pain and medication refill.He states his pain is located in his neck, entire back radiating into lower extremities posteriorly and bilateral hips.He rates his pain 5.His current exercise regime is walking and performing stretching exercises. Two samples of Voltaren gel given instructed to interchange with Flector Patch he verbalizes understanding.  Pain Inventory Average Pain 5 Pain Right Now 5 My pain is all  In the last 24 hours, has pain interfered with the following? General activity 5 Relation with others 6 Enjoyment of life 6 What TIME of day is your pain at its worst? all Sleep (in general) Fair  Pain is worse with: all Pain improves with: rest, heat/ice, therapy/exercise, pacing activities and medication Relief from Meds: 5  Mobility walk without assistance how many minutes can you walk? 60 ability to climb steps?  yes do you drive?  yes  Function not employed: date last employed 06/20/15  Neuro/Psych numbness tingling spasms depression anxiety  Prior Studies Any changes since last visit?  no  Physicians involved in your care Any changes since last visit?  no   Family History  Problem Relation Age of Onset  . Arthritis Mother   . Heart disease Mother   . Diabetes Mother   . Heart disease Father   . Diabetes Father    Social History   Social History  . Marital Status: Married    Spouse Name: N/A  . Number of Children: N/A  . Years of Education: N/A   Social History Main Topics  . Smoking status: Never Smoker   . Smokeless tobacco: Never Used  . Alcohol Use: No  . Drug Use: No  . Sexual Activity: Not Asked   Other Topics Concern  . None   Social History Narrative   No past surgical history on file. Past Medical History  Diagnosis Date  . Chronic pain syndrome     . Postlaminectomy syndrome, cervical region   . 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 Thoracic radiculopathy   . Lumbago   . Sciatica   . Primary localized osteoarthrosis, lower leg   . Pain in joint, lower leg    BP 134/72 mmHg  Pulse 102  SpO2 96%  Opioid Risk Score:   Fall Risk Score:  `1  Depression screen PHQ 2/9  Depression screen [facility] 2/9 06/13/2015 01/11/2015  Decreased Interest 0 0  Down, Depressed, Hopeless 1 1  PHQ - 2 Score 1 1  Altered sleeping - 1  Tired, decreased energy - 1  Change in appetite - 0  Feeling bad or failure about yourself  - 0  Trouble concentrating - 0  Moving slowly or fidgety/restless - 0  Suicidal thoughts - 0  PHQ-9 Score - 3    Review of Systems  Neurological: Positive for weakness and numbness.       Spasms Tingling  Psychiatric/Behavioral: Positive for dysphoric mood. The patient is nervous/anxious.   All other systems reviewed and are negative.      Objective:   Physical Exam  Constitutional: He is oriented to person, place, and time. He appears well-developed and well-nourished.  HENT:  Head: Normocephalic and atraumatic.  Neck: Normal range of motion. Neck supple.  Cardiovascular: Normal rate and regular rhythm.   Pulmonary/Chest: Effort  normal and breath sounds normal.  Musculoskeletal:  Normal Muscle Bulk and Muscle Testing Reveals: Upper Extremities: Full ROM and Muscle Strength 5/5 Thoracic Paraspinal Tenderness: T-4- T-6  T-10- T-12 Lower Extremities: Full ROM and Muscle strength 5/5 Right Lower Extremity Flexion with Popping Noted in [place] Arises from chair with ease Narrow Based gait   Neurological: He is alert and oriented to person, place, and time.  Skin: Skin is warm and dry.  Psychiatric: He has a normal mood and affect.  Nursing note and vitals reviewed.         Assessment & Plan:  1. Cervical postlaminectomy syndrome, Hx of ACDF C4-5, C6-7, C7-T1, 02/2008. Continue  current medication and alternate using heat and ice therapy.  2. Spondylosis of L-spine . Continue current treatment regime.  3. Chronic postoperative pain:  Refilled: Opana 10 mg one tablet twice a day #60 and Oxycodone 5/325mg  one tablet twice a day #60  4. 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 Continue Gabapentin, Keep blood sugars under tight control. Continue to monitor.  5. Thoracic Radiculopathy: Continue Gabapentin.  20 minutes of face to face patient care time was spent. All questions were encouraged and answered.

## 2015-09-12 ENCOUNTER — Encounter: Payer: BLUE CROSS/BLUE SHIELD | Attending: Physical Medicine & Rehabilitation | Admitting: Registered Nurse

## 2015-09-12 ENCOUNTER — Encounter: Payer: Self-pay | Admitting: Registered Nurse

## 2015-09-12 VITALS — BP 134/84 | HR 98

## 2015-09-12 DIAGNOSIS — G8929 Other chronic pain: Secondary | ICD-10-CM | POA: Insufficient documentation

## 2015-09-12 DIAGNOSIS — M5440 Lumbago with sciatica, unspecified side: Secondary | ICD-10-CM | POA: Diagnosis not present

## 2015-09-12 DIAGNOSIS — M5430 Sciatica, unspecified side: Secondary | ICD-10-CM | POA: Diagnosis not present

## 2015-09-12 DIAGNOSIS — M5414 Radiculopathy, thoracic region: Secondary | ICD-10-CM | POA: Diagnosis not present

## 2015-09-12 DIAGNOSIS — M179 Osteoarthritis of knee, unspecified: Secondary | ICD-10-CM | POA: Insufficient documentation

## 2015-09-12 DIAGNOSIS — M47896 Other spondylosis, lumbar region: Secondary | ICD-10-CM | POA: Insufficient documentation

## 2015-09-12 DIAGNOSIS — M79669 Pain in unspecified lower leg: Secondary | ICD-10-CM | POA: Diagnosis not present

## 2015-09-12 DIAGNOSIS — Z5181 Encounter for therapeutic drug level monitoring: Secondary | ICD-10-CM

## 2015-09-12 DIAGNOSIS — E119 Type 2 diabetes mellitus without complications: Secondary | ICD-10-CM | POA: Diagnosis not present

## 2015-09-12 DIAGNOSIS — M797 Fibromyalgia: Secondary | ICD-10-CM

## 2015-09-12 DIAGNOSIS — Z760 Encounter for issue of repeat prescription: Secondary | ICD-10-CM | POA: Diagnosis present

## 2015-09-12 DIAGNOSIS — M5489 Other dorsalgia: Secondary | ICD-10-CM | POA: Insufficient documentation

## 2015-09-12 DIAGNOSIS — M961 Postlaminectomy syndrome, not elsewhere classified: Secondary | ICD-10-CM | POA: Diagnosis not present

## 2015-09-12 DIAGNOSIS — Z79899 Other long term (current) drug therapy: Secondary | ICD-10-CM

## 2015-09-12 DIAGNOSIS — M5135 Other intervertebral disc degeneration, thoracolumbar region: Secondary | ICD-10-CM | POA: Insufficient documentation

## 2015-09-12 DIAGNOSIS — M47817 Spondylosis without myelopathy or radiculopathy, lumbosacral region: Secondary | ICD-10-CM

## 2015-09-12 DIAGNOSIS — M5134 Other intervertebral disc degeneration, thoracic region: Secondary | ICD-10-CM | POA: Insufficient documentation

## 2015-09-12 DIAGNOSIS — M7918 Myalgia, other site: Secondary | ICD-10-CM

## 2015-09-12 DIAGNOSIS — G894 Chronic pain syndrome: Secondary | ICD-10-CM

## 2015-09-12 MED ORDER — OXYCODONE-ACETAMINOPHEN 5-325 MG PO TABS: 1.0000 | ORAL_TABLET | Freq: Two times a day (BID) | ORAL | Status: DC

## 2015-09-12 MED ORDER — OPANA ER 10 MG PO T12A: 10.0000 mg | EXTENDED_RELEASE_TABLET | Freq: Two times a day (BID) | ORAL | Status: DC

## 2015-09-12 NOTE — Progress Notes (Signed)
Subjective:    Patient ID: David [name], male    DOB: 18-Jul-1976, 39 y.o.   MRN: [id number]  HPI: [name] is a 39 year old male who returns for follow up for chronic pain and medication refill.He states his pain is located in his neck, entire back radiating into lower extremities posteriorly and bilateral hips. Also states occasionally in right shoulder and having generalized pain all over.He rates his pain 5.His current exercise regime is walking and performing stretching exercises.   Pain Inventory Average Pain 5 Pain Right Now 5 My pain is intermittent, constant, sharp, burning, dull, stabbing, tingling and aching  In the last 24 hours, has pain interfered with the following? General activity 6 Relation with others 5 Enjoyment of life 6 What TIME of day is your pain at its worst? All the time Sleep (in general) Fair  Pain is worse with: walking, bending, sitting, inactivity, standing and some activites Pain improves with: rest, heat/ice, therapy/exercise, pacing activities and medication Relief from Meds: 5  Mobility walk without assistance how many minutes can you walk? 60 ability to climb steps?  yes do you drive?  yes  Function not employed: date last employed 06/20/2015  Neuro/Psych tingling spasms depression anxiety  Prior Studies Any changes since last visit?  no  Physicians involved in your care Any changes since last visit?  no   Family History  Problem Relation Age of Onset  . Arthritis Mother   . Heart disease Mother   . Diabetes Mother   . Heart disease Father   . Diabetes Father    Social History   Social History  . Marital Status: Married    Spouse Name: N/A  . Number of Children: N/A  . Years of Education: N/A   Social History Main Topics  . Smoking status: Never Smoker   . Smokeless tobacco: Never Used  . Alcohol Use: No  . Drug Use: No  . Sexual Activity: Not Asked   Other Topics Concern  . None   Social History  Narrative   History reviewed. No pertinent past surgical history. Past Medical History  Diagnosis Date  . Chronic pain syndrome   . Postlaminectomy syndrome, cervical region   . 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 Thoracic radiculopathy   . Lumbago   . Sciatica   . Primary localized osteoarthrosis, lower leg   . Pain in joint, lower leg    BP 134/84 mmHg  Pulse 98  SpO2 98%  Opioid Risk Score:   Fall Risk Score:  `1  Depression screen PHQ 2/9  Depression screen [facility] 2/9 06/13/2015 01/11/2015  Decreased Interest 0 0  Down, Depressed, Hopeless 1 1  PHQ - 2 Score 1 1  Altered sleeping - 1  Tired, decreased energy - 1  Change in appetite - 0  Feeling bad or failure about yourself  - 0  Trouble concentrating - 0  Moving slowly or fidgety/restless - 0  Suicidal thoughts - 0  PHQ-9 Score - 3     Review of Systems  Musculoskeletal:       Spasms  Neurological:       Tingling  Psychiatric/Behavioral: The patient is nervous/anxious.        Depression  All other systems reviewed and are negative.      Objective:   Physical Exam  Constitutional: He is oriented to person, place, and time. He appears well-developed and well-nourished.  HENT:  Head: Normocephalic  and atraumatic.  Neck: Normal range of motion. Neck supple.  Cardiovascular: Normal rate and regular rhythm.   Pulmonary/Chest: Effort normal and breath sounds normal.  Musculoskeletal:  Normal Muscle Bulk and Muscle Testing Reveals: Upper Extremities: Full ROM and Muscle Strength 5/5 Thoracic Paraspinal Tenderness: T-10- T-12 Lower Extremities: Full ROM and Muscle Strength 5/5 Arises from chair with ease Narrow Based gait  Neurological: He is alert and oriented to person, place, and time.  Skin: Skin is warm and dry.  Psychiatric: He has a normal mood and affect.  Nursing note and vitals reviewed.         Assessment & Plan:  1. Cervical postlaminectomy syndrome, Hx of  ACDF C4-5, C6-7, C7-T1, 02/2008. Continue current medication and alternate using heat and ice therapy.  2. Spondylosis of L-spine . Continue current treatment regime.  3. Chronic postoperative pain:  Refilled: Opana 10 mg one tablet twice a day #60 and Oxycodone 5/325mg  one tablet twice a day #60  4. 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 Continue Gabapentin, Keep blood sugars under tight control. Continue to monitor.  5. Thoracic Radiculopathy: Continue Gabapentin.  20 minutes of face to face patient care time was spent. All questions were encouraged and answered.

## 2015-10-10 ENCOUNTER — Encounter: Payer: BLUE CROSS/BLUE SHIELD | Attending: Physical Medicine & Rehabilitation | Admitting: Registered Nurse

## 2015-10-10 ENCOUNTER — Encounter: Payer: Self-pay | Admitting: Registered Nurse

## 2015-10-10 ENCOUNTER — Other Ambulatory Visit: Payer: Self-pay | Admitting: Physical Medicine & Rehabilitation

## 2015-10-10 VITALS — BP 131/67 | HR 112

## 2015-10-10 DIAGNOSIS — M797 Fibromyalgia: Secondary | ICD-10-CM

## 2015-10-10 DIAGNOSIS — M179 Osteoarthritis of knee, unspecified: Secondary | ICD-10-CM | POA: Insufficient documentation

## 2015-10-10 DIAGNOSIS — M47817 Spondylosis without myelopathy or radiculopathy, lumbosacral region: Secondary | ICD-10-CM | POA: Diagnosis not present

## 2015-10-10 DIAGNOSIS — Z5181 Encounter for therapeutic drug level monitoring: Secondary | ICD-10-CM

## 2015-10-10 DIAGNOSIS — Z79899 Other long term (current) drug therapy: Secondary | ICD-10-CM

## 2015-10-10 DIAGNOSIS — M5135 Other intervertebral disc degeneration, thoracolumbar region: Secondary | ICD-10-CM | POA: Insufficient documentation

## 2015-10-10 DIAGNOSIS — E119 Type 2 diabetes mellitus without complications: Secondary | ICD-10-CM | POA: Insufficient documentation

## 2015-10-10 DIAGNOSIS — M5414 Radiculopathy, thoracic region: Secondary | ICD-10-CM | POA: Diagnosis not present

## 2015-10-10 DIAGNOSIS — M5430 Sciatica, unspecified side: Secondary | ICD-10-CM | POA: Diagnosis not present

## 2015-10-10 DIAGNOSIS — M961 Postlaminectomy syndrome, not elsewhere classified: Secondary | ICD-10-CM | POA: Diagnosis not present

## 2015-10-10 DIAGNOSIS — G894 Chronic pain syndrome: Secondary | ICD-10-CM

## 2015-10-10 DIAGNOSIS — M7918 Myalgia, other site: Secondary | ICD-10-CM

## 2015-10-10 DIAGNOSIS — M5134 Other intervertebral disc degeneration, thoracic region: Secondary | ICD-10-CM | POA: Insufficient documentation

## 2015-10-10 DIAGNOSIS — M47896 Other spondylosis, lumbar region: Secondary | ICD-10-CM | POA: Insufficient documentation

## 2015-10-10 DIAGNOSIS — Z760 Encounter for issue of repeat prescription: Secondary | ICD-10-CM | POA: Diagnosis present

## 2015-10-10 DIAGNOSIS — G8929 Other chronic pain: Secondary | ICD-10-CM | POA: Insufficient documentation

## 2015-10-10 DIAGNOSIS — M5489 Other dorsalgia: Secondary | ICD-10-CM | POA: Diagnosis not present

## 2015-10-10 DIAGNOSIS — M79669 Pain in unspecified lower leg: Secondary | ICD-10-CM | POA: Insufficient documentation

## 2015-10-10 MED ORDER — OXYCODONE-ACETAMINOPHEN 5-325 MG PO TABS: 1.0000 | ORAL_TABLET | Freq: Two times a day (BID) | ORAL | Status: DC

## 2015-10-10 MED ORDER — OPANA ER 10 MG PO T12A: 10.0000 mg | EXTENDED_RELEASE_TABLET | Freq: Two times a day (BID) | ORAL | Status: DC

## 2015-10-10 NOTE — Progress Notes (Signed)
Subjective:    Patient ID: David [name], male    DOB: 1976/01/03, 39 y.o.   MRN: [id number]  HPI: David [name] is a 39 year old male who returns for follow up for chronic pain and medication refill.He states his pain is located in his neck mainly left side, right shoulder, entire back radiating into lower extremities posteriorly and right hip. Also states having generalized pain all over.He rates his pain 5.His current exercise regime is walking and performing stretching exercises.   Pain Inventory Average Pain 5 Pain Right Now 5 My pain is intermittent, constant, sharp, burning, dull, stabbing, tingling and aching  In the last 24 hours, has pain interfered with the following? General activity 5 Relation with others 5 Enjoyment of life 6 What TIME of day is your pain at its worst? Morning, Daytime, Evening and Night Sleep (in general) Fair  Pain is worse with: walking, bending, sitting, inactivity, standing and some activites Pain improves with: rest, heat/ice, therapy/exercise, pacing activities and medication Relief from Meds: 5  Mobility walk without assistance how many minutes can you walk? 60 ability to climb steps?  yes do you drive?  yes  Function not employed: date last employed 06/10/2015 Do you have any goals in this area?  no  Neuro/Psych numbness tingling spasms depression anxiety  Prior Studies Any changes since last visit?  no  Physicians involved in your care Any changes since last visit?  no   Family History  Problem Relation Age of Onset  . Arthritis Mother   . Heart disease Mother   . Diabetes Mother   . Heart disease Father   . Diabetes Father    Social History   Social History  . Marital Status: Married    Spouse Name: N/A  . Number of Children: N/A  . Years of Education: N/A   Social History Main Topics  . Smoking status: Never Smoker   . Smokeless tobacco: Never Used  . Alcohol Use: No  . Drug Use: No  . Sexual Activity:  Not Asked   Other Topics Concern  . None   Social History Narrative   History reviewed. No pertinent past surgical history. Past Medical History  Diagnosis Date  . Chronic pain syndrome   . Postlaminectomy syndrome, cervical region   . 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 Thoracic radiculopathy   . Lumbago   . Sciatica   . Primary localized osteoarthrosis, lower leg   . Pain in joint, lower leg    BP 131/67 mmHg  Pulse 112  SpO2 95%  Opioid Risk Score:   Fall Risk Score:  `1  Depression screen PHQ 2/9  Depression screen [facility] 2/9 06/13/2015 01/11/2015  Decreased Interest 0 0  Down, Depressed, Hopeless 1 1  PHQ - 2 Score 1 1  Altered sleeping - 1  Tired, decreased energy - 1  Change in appetite - 0  Feeling bad or failure about yourself  - 0  Trouble concentrating - 0  Moving slowly or fidgety/restless - 0  Suicidal thoughts - 0  PHQ-9 Score - 3     Review of Systems  Musculoskeletal:       Spasms  Neurological: Positive for numbness.       Tingling  Psychiatric/Behavioral: The patient is nervous/anxious.        Depression  All other systems reviewed and are negative.      Objective:   Physical Exam  Constitutional: He is  oriented to person, place, and time. He appears well-developed and well-nourished.  HENT:  Head: Normocephalic and atraumatic.  Neck: Normal range of motion. Neck supple.  Cardiovascular: Normal rate and regular rhythm.   Pulmonary/Chest: Effort normal and breath sounds normal.  Musculoskeletal:  Normal Muscle Bulk and Muscle Testing Reveals: Upper Extremities: Full ROM and Muscle Strength 5/5 Thoracic Paraspinal Tenderness:  T-10- T-12 Lower Extremities: Full ROM and Muscle Strength 5/5 Arises from chair with ease Narrow Based Gait    Neurological: He is alert and oriented to person, place, and time.  Skin: Skin is warm and dry.  Psychiatric: He has a normal mood and affect.  Nursing note and vitals  reviewed.         Assessment & Plan:  1. Cervical postlaminectomy syndrome, Hx of ACDF C4-5, C6-7, C7-T1, 02/2008. Continue current medication and alternate using heat and ice therapy.  2. Spondylosis of L-spine . Continue current treatment regime.  3. Chronic postoperative pain:  Refilled: Opana 10 mg one tablet twice a day #60 and Oxycodone 5/325mg  one tablet twice a day #60  4. 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 Continue Gabapentin, Keep blood sugars under tight control. Continue to monitor.  5. Thoracic Radiculopathy: Continue Gabapentin.  20 minutes of face to face patient care time was spent. All questions were encouraged and answered.

## 2015-10-13 LAB — BENZODIAZEPINES (GC/LC/MS), URINE
Alprazolam metabolite (GC/LC/MS), ur confirm: NEGATIVE ng/mL — AB (ref <25)
CLONAZEPAU: NEGATIVE ng/mL (ref <25)
FLURAZEPAMU: NEGATIVE ng/mL (ref <50)
LORAZEPAMU: NEGATIVE ng/mL (ref <50)
Midazolam (GC/LC/MS), ur confirm: NEGATIVE ng/mL (ref <50)
NORDIAZEPAMU: 378 ng/mL — AB (ref <50)
OXAZEPAMU: 862 ng/mL — AB (ref <50)
TRIAZOLAMU: NEGATIVE ng/mL (ref <50)
Temazepam (GC/LC/MS), ur confirm: 1151 ng/mL — AB (ref <50)

## 2015-10-13 LAB — OXYCODONE, URINE (LC/MS-MS)
NOROXYCODONE, UR: 1173 ng/mL (ref <50)
OXYCODONE, UR: 399 ng/mL (ref <50)
Oxymorphone: 24112 ng/mL (ref <50)

## 2015-10-13 LAB — OPIATES/OPIOIDS (LC/MS-MS)
Codeine Urine: NEGATIVE ng/mL (ref <50)
Hydrocodone: NEGATIVE ng/mL (ref <50)
Hydromorphone: NEGATIVE ng/mL (ref <50)
Morphine Urine: NEGATIVE ng/mL (ref <50)
NORHYDROCODONE, UR: NEGATIVE ng/mL (ref <50)
NOROXYCODONE, UR: 1173 ng/mL (ref <50)
OXYCODONE, UR: 399 ng/mL (ref <50)
Oxymorphone: 24112 ng/mL (ref <50)

## 2015-10-15 LAB — PRESCRIPTION MONITORING PROFILE (SOLSTAS)
AMPHETAMINE/METH: NEGATIVE ng/mL
BARBITURATE SCREEN, URINE: NEGATIVE ng/mL
Buprenorphine, Urine: NEGATIVE ng/mL
CREATININE, URINE: 301.27 mg/dL (ref >20.0)
Cannabinoid Scrn, Ur: NEGATIVE ng/mL
Carisoprodol, Urine: NEGATIVE ng/mL
Cocaine Metabolites: NEGATIVE ng/mL
Fentanyl, Ur: NEGATIVE ng/mL
MDMA URINE: NEGATIVE ng/mL
Meperidine, Ur: NEGATIVE ng/mL
Methadone Screen, Urine: NEGATIVE ng/mL
NITRITES URINE, INITIAL: NEGATIVE ug/mL
Propoxyphene: NEGATIVE ng/mL
TAPENTADOLUR: NEGATIVE ng/mL
TRAMADOL UR: NEGATIVE ng/mL
Zolpidem, Urine: NEGATIVE ng/mL
pH, Initial: 6.6 pH (ref 4.5–8.9)

## 2015-10-16 NOTE — Progress Notes (Signed)
Urine drug screen for this encounter is consistent for prescribed medication.  Has been prescribed diazepam in July and it is present in uds.

## 2015-10-31 ENCOUNTER — Other Ambulatory Visit: Payer: Self-pay | Admitting: Registered Nurse

## 2015-11-07 ENCOUNTER — Encounter: Payer: BLUE CROSS/BLUE SHIELD | Attending: Physical Medicine & Rehabilitation | Admitting: Registered Nurse

## 2015-11-07 ENCOUNTER — Encounter: Payer: Self-pay | Admitting: Registered Nurse

## 2015-11-07 VITALS — BP 134/64 | HR 106

## 2015-11-07 DIAGNOSIS — M5135 Other intervertebral disc degeneration, thoracolumbar region: Secondary | ICD-10-CM | POA: Diagnosis not present

## 2015-11-07 DIAGNOSIS — M79669 Pain in unspecified lower leg: Secondary | ICD-10-CM | POA: Diagnosis not present

## 2015-11-07 DIAGNOSIS — Z79899 Other long term (current) drug therapy: Secondary | ICD-10-CM

## 2015-11-07 DIAGNOSIS — G894 Chronic pain syndrome: Secondary | ICD-10-CM

## 2015-11-07 DIAGNOSIS — M179 Osteoarthritis of knee, unspecified: Secondary | ICD-10-CM | POA: Diagnosis not present

## 2015-11-07 DIAGNOSIS — M5414 Radiculopathy, thoracic region: Secondary | ICD-10-CM | POA: Diagnosis not present

## 2015-11-07 DIAGNOSIS — M5430 Sciatica, unspecified side: Secondary | ICD-10-CM | POA: Insufficient documentation

## 2015-11-07 DIAGNOSIS — G8929 Other chronic pain: Secondary | ICD-10-CM | POA: Diagnosis not present

## 2015-11-07 DIAGNOSIS — M797 Fibromyalgia: Secondary | ICD-10-CM | POA: Diagnosis not present

## 2015-11-07 DIAGNOSIS — Z5181 Encounter for therapeutic drug level monitoring: Secondary | ICD-10-CM

## 2015-11-07 DIAGNOSIS — E119 Type 2 diabetes mellitus without complications: Secondary | ICD-10-CM | POA: Diagnosis not present

## 2015-11-07 DIAGNOSIS — M5489 Other dorsalgia: Secondary | ICD-10-CM | POA: Insufficient documentation

## 2015-11-07 DIAGNOSIS — M47817 Spondylosis without myelopathy or radiculopathy, lumbosacral region: Secondary | ICD-10-CM

## 2015-11-07 DIAGNOSIS — M961 Postlaminectomy syndrome, not elsewhere classified: Secondary | ICD-10-CM

## 2015-11-07 DIAGNOSIS — M7918 Myalgia, other site: Secondary | ICD-10-CM

## 2015-11-07 DIAGNOSIS — Z760 Encounter for issue of repeat prescription: Secondary | ICD-10-CM | POA: Insufficient documentation

## 2015-11-07 DIAGNOSIS — M47896 Other spondylosis, lumbar region: Secondary | ICD-10-CM | POA: Insufficient documentation

## 2015-11-07 DIAGNOSIS — M5134 Other intervertebral disc degeneration, thoracic region: Secondary | ICD-10-CM | POA: Insufficient documentation

## 2015-11-07 MED ORDER — OPANA ER 10 MG PO T12A: 10.0000 mg | EXTENDED_RELEASE_TABLET | Freq: Two times a day (BID) | ORAL | Status: DC

## 2015-11-07 MED ORDER — OXYCODONE-ACETAMINOPHEN 5-325 MG PO TABS: 1.0000 | ORAL_TABLET | Freq: Two times a day (BID) | ORAL | Status: DC

## 2015-11-07 NOTE — Progress Notes (Signed)
Subjective:    Patient ID: David [name], male    DOB: [birth year], 40 y.o.   MRN: [id number]  HPI:David [name] is a 40 year old male who returns for follow up for chronic pain and medication refill.He states his pain is located in his neck, right shoulder, entire back radiating into lower extremities posteriorly and right hip. Also states having generalized pain all over and occasionally has a headache. He denies headache today. He's using tylenol for headaches and obtaining relief.He rates his pain 5.His current exercise regime is walking and performing stretching exercises.  David [name] wanted to know if he can obtain hip injection without prednisone due to allergy. I asked Dr. Wynn Banker he will be able to receive the injection. Placed a call to David [name] left message to return the call.  Pain Inventory Average Pain 5 Pain Right Now 5 My pain is constant, sharp, burning, dull, stabbing, tingling and aching  In the last 24 hours, has pain interfered with the following? General activity 5 Relation with others 5 Enjoyment of life 5 What TIME of day is your pain at its worst? daytime evening and night Sleep (in general) Fair  Pain is worse with: walking, bending, sitting, standing and some activites Pain improves with: rest, heat/ice, pacing activities and medication Relief from Meds: 5  Mobility walk without assistance how many minutes can you walk? 60 ability to climb steps?  yes do you drive?  yes  Function not employed: date last employed 06/2015  Neuro/Psych numbness tingling spasms depression anxiety  Prior Studies Any changes since last visit?  no  Physicians involved in your care Any changes since last visit?  no   Family History  Problem Relation Age of Onset  . Arthritis Mother   . Heart disease Mother   . Diabetes Mother   . Heart disease Father   . Diabetes Father    Social History   Social History  . Marital Status: Married    Spouse Name:  N/A  . Number of Children: N/A  . Years of Education: N/A   Social History Main Topics  . Smoking status: Never Smoker   . Smokeless tobacco: Never Used  . Alcohol Use: No  . Drug Use: No  . Sexual Activity: Not Asked   Other Topics Concern  . None   Social History Narrative   History reviewed. No pertinent past surgical history. Past Medical History  Diagnosis Date  . Chronic pain syndrome   . Postlaminectomy syndrome, cervical region   . 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 Thoracic radiculopathy   . Lumbago   . Sciatica   . Primary localized osteoarthrosis, lower leg   . Pain in joint, lower leg    BP 134/64 mmHg  Pulse 106  SpO2 99%  Opioid Risk Score:   Fall Risk Score:  `1  Depression screen PHQ 2/9  Depression screen [facility] 2/9 06/13/2015 01/11/2015  Decreased Interest 0 0  Down, Depressed, Hopeless 1 1  PHQ - 2 Score 1 1  Altered sleeping - 1  Tired, decreased energy - 1  Change in appetite - 0  Feeling bad or failure about yourself  - 0  Trouble concentrating - 0  Moving slowly or fidgety/restless - 0  Suicidal thoughts - 0  PHQ-9 Score - 3     Review of Systems  All other systems reviewed and are negative.      Objective:   Physical Exam  Constitutional: He is oriented to person, place, and time. He appears well-developed and well-nourished.  HENT:  Head: Normocephalic and atraumatic.  Neck: Normal range of motion. Neck supple.  Cardiovascular: Normal rate and regular rhythm.   Pulmonary/Chest: Effort normal and breath sounds normal.  Musculoskeletal:  Normal Muscle Bulk and Muscle testing Reveals: Upper Extremities: Full ROM and Muscle Strength 5/5 Thoracic Paraspinal Tenderness: T-10- T-12 Lumbar Paraspinal Tenderness: L-3- L-5 Lower Extremities: Full ROM and Muscle Strength 5/5 Arises from chair with ease Narrow Based gait  Neurological: He is alert and oriented to person, place, and time.  Skin: Skin is  warm and dry.  Psychiatric: He has a normal mood and affect.  Nursing note and vitals reviewed.         Assessment & Plan:  1. Cervical postlaminectomy syndrome, Hx of ACDF C4-5, C6-7, C7-T1, 02/2008. Continue current medication and alternate using heat and ice therapy.  2. Spondylosis of L-spine . Continue current treatment regime.  3. Chronic postoperative pain:  Refilled: Opana 10 mg one tablet twice a day #60 and Oxycodone 5/325mg  one tablet twice a day #60  4. 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 Continue Gabapentin, Keep blood sugars under tight control. Continue to monitor.  5. Thoracic Radiculopathy: Continue Gabapentin.  20 minutes of face to face patient care time was spent. All questions were encouraged and answered.

## 2015-12-05 ENCOUNTER — Encounter: Payer: Self-pay | Admitting: Physical Medicine & Rehabilitation

## 2015-12-05 ENCOUNTER — Encounter: Payer: BLUE CROSS/BLUE SHIELD | Attending: Physical Medicine & Rehabilitation

## 2015-12-05 ENCOUNTER — Ambulatory Visit (HOSPITAL_BASED_OUTPATIENT_CLINIC_OR_DEPARTMENT_OTHER): Payer: BLUE CROSS/BLUE SHIELD | Admitting: Physical Medicine & Rehabilitation

## 2015-12-05 VITALS — BP 114/63 | HR 103 | Resp 14

## 2015-12-05 DIAGNOSIS — M24551 Contracture, right hip: Secondary | ICD-10-CM

## 2015-12-05 DIAGNOSIS — M797 Fibromyalgia: Secondary | ICD-10-CM

## 2015-12-05 DIAGNOSIS — M179 Osteoarthritis of knee, unspecified: Secondary | ICD-10-CM | POA: Insufficient documentation

## 2015-12-05 DIAGNOSIS — G8929 Other chronic pain: Secondary | ICD-10-CM | POA: Insufficient documentation

## 2015-12-05 DIAGNOSIS — E119 Type 2 diabetes mellitus without complications: Secondary | ICD-10-CM | POA: Insufficient documentation

## 2015-12-05 DIAGNOSIS — M5135 Other intervertebral disc degeneration, thoracolumbar region: Secondary | ICD-10-CM | POA: Diagnosis not present

## 2015-12-05 DIAGNOSIS — M5430 Sciatica, unspecified side: Secondary | ICD-10-CM | POA: Insufficient documentation

## 2015-12-05 DIAGNOSIS — M961 Postlaminectomy syndrome, not elsewhere classified: Secondary | ICD-10-CM | POA: Diagnosis not present

## 2015-12-05 DIAGNOSIS — M5134 Other intervertebral disc degeneration, thoracic region: Secondary | ICD-10-CM | POA: Diagnosis not present

## 2015-12-05 DIAGNOSIS — M79669 Pain in unspecified lower leg: Secondary | ICD-10-CM | POA: Insufficient documentation

## 2015-12-05 DIAGNOSIS — Z760 Encounter for issue of repeat prescription: Secondary | ICD-10-CM | POA: Insufficient documentation

## 2015-12-05 DIAGNOSIS — M47896 Other spondylosis, lumbar region: Secondary | ICD-10-CM | POA: Insufficient documentation

## 2015-12-05 DIAGNOSIS — M5489 Other dorsalgia: Secondary | ICD-10-CM | POA: Diagnosis not present

## 2015-12-05 DIAGNOSIS — M5414 Radiculopathy, thoracic region: Secondary | ICD-10-CM | POA: Insufficient documentation

## 2015-12-05 DIAGNOSIS — G8928 Other chronic postprocedural pain: Secondary | ICD-10-CM | POA: Diagnosis not present

## 2015-12-05 DIAGNOSIS — M7918 Myalgia, other site: Secondary | ICD-10-CM

## 2015-12-05 MED ORDER — OXYCODONE-ACETAMINOPHEN 5-325 MG PO TABS: 1.0000 | ORAL_TABLET | Freq: Two times a day (BID) | ORAL | Status: DC

## 2015-12-05 MED ORDER — OPANA ER 10 MG PO T12A: 10.0000 mg | EXTENDED_RELEASE_TABLET | Freq: Two times a day (BID) | ORAL | Status: DC

## 2015-12-05 NOTE — Progress Notes (Signed)
Subjective:    Patient ID: David [name], male    DOB: [birth year], 40 y.o.   MRN: [id number] Chief complaint right sided neck pain HPI   Right-sided neck pain has not responded well to heat. He points to his right trapezius area as well as the base of the skull. He has no increasing numbness or tingling in the right upper extremity. No pain with shoulder motion.  He has not been working for the last several months. He is looking for a new job.  Right hip pain flaresup and gets tight episodically.  Mainly ant groin area.  Pain with standing but able to walk better than stand.  No numbness or tingling over that area.No particular motions are worse than others, Knee flexion stretching as well as adductor stretching helps.  These episodes occur every  4-6 wks and may last for 1-2 wks  Pain Inventory Average Pain 5 Pain Right Now 5 My pain is intermittent, constant, sharp, burning, dull, stabbing, tingling and aching  In the last 24 hours, has pain interfered with the following? General activity 6 Relation with others 5 Enjoyment of life 6 What TIME of day is your pain at its worst? all Sleep (in general) Fair  Pain is worse with: walking, bending, sitting, inactivity, standing and some activites Pain improves with: rest, heat/ice, therapy/exercise, pacing activities and medication Relief from Meds: 5  Mobility walk without assistance how many minutes can you walk? 60 ability to climb steps?  yes do you drive?  yes  Function not employed: date last employed aug 2016  Neuro/Psych numbness tingling spasms depression anxiety  Prior Studies Any changes since last visit?  no  Physicians involved in your care Any changes since last visit?  no   Family History  Problem Relation Age of Onset  . Arthritis Mother   . Heart disease Mother   . Diabetes Mother   . Heart disease Father   . Diabetes Father    Social History   Social History  . Marital Status: Married   Spouse Name: N/A  . Number of Children: N/A  . Years of Education: N/A   Social History Main Topics  . Smoking status: Never Smoker   . Smokeless tobacco: Never Used  . Alcohol Use: No  . Drug Use: No  . Sexual Activity: Not Asked   Other Topics Concern  . None   Social History Narrative   History reviewed. No pertinent past surgical history. Past Medical History  Diagnosis Date  . Chronic pain syndrome   . Postlaminectomy syndrome, cervical region   . 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 Thoracic radiculopathy   . Lumbago   . Sciatica   . Primary localized osteoarthrosis, lower leg   . Pain in joint, lower leg    BP 114/63 mmHg  Pulse 103  Resp 14  SpO2 97%  Opioid Risk Score:   Fall Risk Score:  `1  Depression screen PHQ 2/9  Depression screen [facility] 2/9 06/13/2015 01/11/2015  Decreased Interest 0 0  Down, Depressed, Hopeless 1 1  PHQ - 2 Score 1 1  Altered sleeping - 1  Tired, decreased energy - 1  Change in appetite - 0  Feeling bad or failure about yourself  - 0  Trouble concentrating - 0  Moving slowly or fidgety/restless - 0  Suicidal thoughts - 0  PHQ-9 Score - 3     Review of Systems  All other systems reviewed and  are negative.      Objective:   Physical Exam  Constitutional: He is oriented to person, place, and time. He appears well-developed and well-nourished.  HENT:  Head: Normocephalic and atraumatic.  Eyes: Conjunctivae and EOM are normal. Pupils are equal, round, and reactive to light.  Musculoskeletal:       Right shoulder: He exhibits normal range of motion, no tenderness, no deformity and no spasm.       Right hip: He exhibits decreased range of motion.       Left hip: Normal.  Right shoulder has full range of motion, negative impingement signs  Positive tenderness to palpation over the right upper trapezius as well as the suboccipital area  Neurological: He is alert and oriented to person, place, and  time.  Motor strength is 5/5 bilateral deltoids, biceps, triceps, grip, hip flexor, knee extensor, ankle dorsi flexor Normal sensation both upper limbs to light touch and pinprick  Psychiatric: He has a normal mood and affect.  Nursing note and vitals reviewed.   Right hip has limited internal rotation approximately 50% of normal, external rotation is normal, left hip range of motion is normal There is no tenderness over the lateral trochanter area on the right side. Negative straight leg raising      Assessment & Plan:  1. Cervical postlaminectomy syndrome, Hx of ACDF C4-5, C6-7, C7-T1, 02/2008. Continue current medication and alternate using heat and ice therapy.  2. Spondylosis of L-spine . Continue current treatment regime.  3. Chronic postoperative pain:  Refilled: Opana 10 mg one tablet twice a day #60 and Oxycodone 5/325mg  one tablet twice a day #60  4. 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 Continue Gabapentin, Keep blood sugars under tight control. Continue to monitor.  5. Thoracic Radiculopathy: Continue Gabapentin. 6. Right hip pain With contracture of the joint most likely some osteoarthritis, do not think he has bursitis. Recommend x-ray however due to financial concerns he does not wish to pursue this at the current time. He may use nonsteroidal anti-inflammatories, over-the-counter for flareups. 7.Right-sided neck pain: Likely myofascial tenderness over the trapezius areas discussed conservative care discussed trigger point injections as well as physical therapy and heat and acupuncture 20 minutes of face to face patient care time was spent. All questions were encouraged and answered.

## 2015-12-05 NOTE — Progress Notes (Deleted)
   Subjective:    Patient ID: David [name], male    DOB: 09/01/1976, 40 y.o.   MRN: [id number]  HPI    Review of Systems  All other systems reviewed and are negative.      Objective:   Physical Exam        Assessment & Plan:   Pain Inventory Average Pain 5 Pain Right Now 5 My pain is intermittent, constant, sharp, burning, dull, stabbing, tingling and aching  In the last 24 hours, has pain interfered with the following? General activity 6 Relation with others 5 Enjoyment of life 6 What TIME of day is your pain at its worst? all Sleep (in general) Fair  Pain is worse with: walking, bending, sitting, inactivity, standing and some activites Pain improves with: rest, heat/ice, therapy/exercise, pacing activities and medication Relief from Meds: 5  Mobility walk without assistance how many minutes can you walk? 60 ability to climb steps?  yes do you drive?  yes  Function not employed: date last employed aug 2016  Neuro/Psych numbness tingling spasms depression anxiety  Prior Studies Any changes since last visit?  no  Physicians involved in your care Any changes since last visit?  no   Family History  Problem Relation Age of Onset  . Arthritis Mother   . Heart disease Mother   . Diabetes Mother   . Heart disease Father   . Diabetes Father    Social History   Social History  . Marital Status: Married    Spouse Name: N/A  . Number of Children: N/A  . Years of Education: N/A   Social History Main Topics  . Smoking status: Never Smoker   . Smokeless tobacco: Never Used  . Alcohol Use: No  . Drug Use: No  . Sexual Activity: Not Asked   Other Topics Concern  . None   Social History Narrative   History reviewed. No pertinent past surgical history. Past Medical History  Diagnosis Date  . Chronic pain syndrome   . Postlaminectomy syndrome, cervical region   . 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 Thoracic  radiculopathy   . Lumbago   . Sciatica   . Primary localized osteoarthrosis, lower leg   . Pain in joint, lower leg    BP 114/63 mmHg  Pulse 103  Resp 14  SpO2 97%  Opioid Risk Score:   Fall Risk Score:  `1  Depression screen PHQ 2/9  Depression screen [facility] 2/9 06/13/2015 01/11/2015  Decreased Interest 0 0  Down, Depressed, Hopeless 1 1  PHQ - 2 Score 1 1  Altered sleeping - 1  Tired, decreased energy - 1  Change in appetite - 0  Feeling bad or failure about yourself  - 0  Trouble concentrating - 0  Moving slowly or fidgety/restless - 0  Suicidal thoughts - 0  PHQ-9 Score - 3

## 2015-12-05 NOTE — Patient Instructions (Signed)
Please resume hamstring stretching exercises as well as low back stretching exercises

## 2016-01-02 ENCOUNTER — Encounter: Payer: Self-pay | Admitting: Physical Medicine & Rehabilitation

## 2016-01-02 ENCOUNTER — Encounter: Payer: BLUE CROSS/BLUE SHIELD | Attending: Physical Medicine & Rehabilitation

## 2016-01-02 ENCOUNTER — Ambulatory Visit (HOSPITAL_BASED_OUTPATIENT_CLINIC_OR_DEPARTMENT_OTHER): Payer: BLUE CROSS/BLUE SHIELD | Admitting: Physical Medicine & Rehabilitation

## 2016-01-02 VITALS — BP 125/63 | HR 97 | Resp 14

## 2016-01-02 DIAGNOSIS — E119 Type 2 diabetes mellitus without complications: Secondary | ICD-10-CM | POA: Diagnosis not present

## 2016-01-02 DIAGNOSIS — M5414 Radiculopathy, thoracic region: Secondary | ICD-10-CM | POA: Insufficient documentation

## 2016-01-02 DIAGNOSIS — M5135 Other intervertebral disc degeneration, thoracolumbar region: Secondary | ICD-10-CM | POA: Insufficient documentation

## 2016-01-02 DIAGNOSIS — M47896 Other spondylosis, lumbar region: Secondary | ICD-10-CM | POA: Insufficient documentation

## 2016-01-02 DIAGNOSIS — Z760 Encounter for issue of repeat prescription: Secondary | ICD-10-CM | POA: Insufficient documentation

## 2016-01-02 DIAGNOSIS — M179 Osteoarthritis of knee, unspecified: Secondary | ICD-10-CM | POA: Diagnosis not present

## 2016-01-02 DIAGNOSIS — M5134 Other intervertebral disc degeneration, thoracic region: Secondary | ICD-10-CM | POA: Insufficient documentation

## 2016-01-02 DIAGNOSIS — M797 Fibromyalgia: Secondary | ICD-10-CM | POA: Diagnosis not present

## 2016-01-02 DIAGNOSIS — M5440 Lumbago with sciatica, unspecified side: Secondary | ICD-10-CM

## 2016-01-02 DIAGNOSIS — G8928 Other chronic postprocedural pain: Secondary | ICD-10-CM

## 2016-01-02 DIAGNOSIS — M79669 Pain in unspecified lower leg: Secondary | ICD-10-CM | POA: Insufficient documentation

## 2016-01-02 DIAGNOSIS — M961 Postlaminectomy syndrome, not elsewhere classified: Secondary | ICD-10-CM | POA: Diagnosis not present

## 2016-01-02 DIAGNOSIS — G8929 Other chronic pain: Secondary | ICD-10-CM | POA: Insufficient documentation

## 2016-01-02 DIAGNOSIS — M5489 Other dorsalgia: Secondary | ICD-10-CM | POA: Diagnosis not present

## 2016-01-02 DIAGNOSIS — M5430 Sciatica, unspecified side: Secondary | ICD-10-CM | POA: Insufficient documentation

## 2016-01-02 DIAGNOSIS — M7918 Myalgia, other site: Secondary | ICD-10-CM

## 2016-01-02 MED ORDER — OXYCODONE-ACETAMINOPHEN 5-325 MG PO TABS: 1.0000 | ORAL_TABLET | Freq: Two times a day (BID) | ORAL | Status: DC

## 2016-01-02 MED ORDER — OPANA ER 10 MG PO T12A: 10.0000 mg | EXTENDED_RELEASE_TABLET | Freq: Two times a day (BID) | ORAL | Status: DC

## 2016-01-02 NOTE — Patient Instructions (Signed)
Cont stretching program

## 2016-01-02 NOTE — Progress Notes (Signed)
Subjective:    Patient ID: David [name], male    DOB: [birth year], 40 y.o.   MRN: [id number]  HPI No new issues Prolonged standing aggravates RIght neck pain on and off Mid Back pain relieved with "popping back" Still trying to walk. No longer working but is trying to look for a new job. Patient is stressed out. Pain Inventory Average Pain 5 Pain Right Now 6 My pain is intermittent, constant, sharp, burning, dull, stabbing, tingling and aching  In the last 24 hours, has pain interfered with the following? General activity 6 Relation with others 5 Enjoyment of life 6 What TIME of day is your pain at its worst? all Sleep (in general) Fair  Pain is worse with: walking, bending, sitting, inactivity, standing and some activites Pain improves with: rest, heat/ice, therapy/exercise, pacing activities and medication Relief from Meds: 5  Mobility walk without assistance ability to climb steps?  yes do you drive?  yes  Function not employed: date last employed .  Neuro/Psych numbness tingling spasms depression anxiety  Prior Studies Any changes since last visit?  no  Physicians involved in your care Any changes since last visit?  no   Family History  Problem Relation Age of Onset  . Arthritis Mother   . Heart disease Mother   . Diabetes Mother   . Heart disease Father   . Diabetes Father    Social History   Social History  . Marital Status: Married    Spouse Name: N/A  . Number of Children: N/A  . Years of Education: N/A   Social History Main Topics  . Smoking status: Never Smoker   . Smokeless tobacco: Never Used  . Alcohol Use: No  . Drug Use: No  . Sexual Activity: Not Asked   Other Topics Concern  . None   Social History Narrative   History reviewed. No pertinent past surgical history. Past Medical History  Diagnosis Date  . Chronic pain syndrome   . Postlaminectomy syndrome, cervical region   . Degeneration of thoracic or thoracolumbar  intervertebral disc   . Thoracic radiculopathy   . Lumbago   . Sciatica   . Primary localized osteoarthrosis, lower leg   . Pain in joint, lower leg    BP 125/63 mmHg  Pulse 97  Resp 14  SpO2 97%  Opioid Risk Score:   Fall Risk Score:  `1  Depression screen PHQ 2/9  Depression screen [facility] 2/9 01/02/2016 06/13/2015 01/11/2015  Decreased Interest 1 0 0  Down, Depressed, Hopeless PHQ - 2 Score Altered sleeping 2 - 1  Tired, decreased energy 2 - 1  Change in appetite 0 - 0  Feeling bad or failure about yourself  0 - 0  Trouble concentrating 0 - 0  Moving slowly or fidgety/restless 0 - 0  Suicidal thoughts 0 - 0  PHQ-9 Score 6 - 3  Difficult doing work/chores Somewhat difficult - -     Review of Systems  All other systems reviewed and are negative.      Objective:   Physical Exam  Constitutional: He is oriented to person, place, and time. He appears well-developed and well-nourished.  HENT:  Head: Normocephalic and atraumatic.  Eyes: Conjunctivae and EOM are normal. Pupils are equal, round, and reactive to light.  Neurological: He is alert and oriented to person, place, and time.  Psychiatric: He has a normal mood and affect.  Nursing note and vitals reviewed.  Tenderness L1 paraspinal Has some tightness over the right trapezius but no tenderness. Cervical range of motion is 75% flexion extension lateral bending and rotation. Lumbar range of motion 50% flexion extension lateral bending Motor strength is 5/5 bilateral upper and lower limbs     Assessment & Plan:  1. Cervical postlaminectomy syndrome, Hx of ACDF C4-5, C6-7, C7-T1, 02/2008. Continue current medication and alternate using heat and ice therapy.  2. Spondylosis of L-spine . Continue current treatment regime.  3. Chronic postoperative pain:  Refilled: Opana 10 mg one tablet twice a day #60 and Oxycodone 5/325mg  one tablet twice a day #60 , no constipation 4. Diabetes with probable  neuropathy. Continue Gabapentin, Keep blood sugars under tight control. Continue to monitor.  5. Thoracic Radiculopathy: Continue Gabapentin. 6. Right hip pain without contracture of the joint most likely some osteoarthritis, do not think he has bursitis. Recommend x-ray however due to financial concerns he does not wish to pursue this at the current time. He may use nonsteroidal anti-inflammatories, over-the-counter for flareups. 7.Right-sided neck pain: Myofascial, trapezius, improving.

## 2016-01-30 ENCOUNTER — Encounter (HOSPITAL_BASED_OUTPATIENT_CLINIC_OR_DEPARTMENT_OTHER): Payer: BLUE CROSS/BLUE SHIELD | Admitting: Registered Nurse

## 2016-01-30 ENCOUNTER — Encounter: Payer: Self-pay | Admitting: Registered Nurse

## 2016-01-30 VITALS — BP 110/69 | HR 96 | Resp 14

## 2016-01-30 DIAGNOSIS — G894 Chronic pain syndrome: Secondary | ICD-10-CM

## 2016-01-30 DIAGNOSIS — Z760 Encounter for issue of repeat prescription: Secondary | ICD-10-CM | POA: Diagnosis not present

## 2016-01-30 DIAGNOSIS — Z5181 Encounter for therapeutic drug level monitoring: Secondary | ICD-10-CM | POA: Diagnosis not present

## 2016-01-30 DIAGNOSIS — Z79899 Other long term (current) drug therapy: Secondary | ICD-10-CM

## 2016-01-30 DIAGNOSIS — M47817 Spondylosis without myelopathy or radiculopathy, lumbosacral region: Secondary | ICD-10-CM

## 2016-01-30 DIAGNOSIS — M542 Cervicalgia: Secondary | ICD-10-CM

## 2016-01-30 DIAGNOSIS — M961 Postlaminectomy syndrome, not elsewhere classified: Secondary | ICD-10-CM | POA: Diagnosis not present

## 2016-01-30 DIAGNOSIS — M7918 Myalgia, other site: Secondary | ICD-10-CM

## 2016-01-30 DIAGNOSIS — M797 Fibromyalgia: Secondary | ICD-10-CM

## 2016-01-30 MED ORDER — OPANA ER 10 MG PO T12A: 10.0000 mg | EXTENDED_RELEASE_TABLET | Freq: Two times a day (BID) | ORAL | Status: DC

## 2016-01-30 MED ORDER — OXYCODONE-ACETAMINOPHEN 5-325 MG PO TABS: 1.0000 | ORAL_TABLET | Freq: Two times a day (BID) | ORAL | Status: DC

## 2016-01-30 NOTE — Progress Notes (Signed)
Subjective:    Patient ID: David [name], male    DOB: 09/09/1976, 40 y.o.   MRN: [id number]  HPI: David [name] is a 40 year old male who returns for follow up for chronic pain and medication refill.He states his pain is located in his neck, right shoulder, mid-lower back, right hip and lower extremities. Also states he's having generalized pain all over.He rates his pain 5.His current exercise regime is walking and performing stretching exercises.  David [name] has been out of work for 7 months and he's experiencing financial difficulties. We will allow him to follow up in 2 months. He's very appreciative. He realizes this cannot happen consistently and verbalizes understanding.   Pain Inventory Average Pain 5 Pain Right Now 5 My pain is intermittent, constant, sharp, burning, dull, stabbing, tingling and aching  In the last 24 hours, has pain interfered with the following? General activity 5 Relation with others 5 Enjoyment of life 5 What TIME of day is your pain at its worst? all Sleep (in general) Fair  Pain is worse with: walking, bending, sitting, inactivity, standing and some activites Pain improves with: rest, heat/ice, pacing activities and medication Relief from Meds: 5  Mobility walk without assistance how many minutes can you walk? 60 ability to climb steps?  yes do you drive?  yes  Function not employed: date last employed .  Neuro/Psych numbness tingling spasms depression anxiety  Prior Studies Any changes since last visit?  no x-rays CT/MRI  Physicians involved in your care Any changes since last visit?  no   Family History  Problem Relation Age of Onset  . Arthritis Mother   . Heart disease Mother   . Diabetes Mother   . Heart disease Father   . Diabetes Father    Social History   Social History  . Marital Status: Married    Spouse Name: N/A  . Number of Children: N/A  . Years of Education: N/A   Social History Main Topics  .  Smoking status: Never Smoker   . Smokeless tobacco: Never Used  . Alcohol Use: No  . Drug Use: No  . Sexual Activity: Not Asked   Other Topics Concern  . None   Social History Narrative   History reviewed. No pertinent past surgical history. Past Medical History  Diagnosis Date  . Chronic pain syndrome   . Postlaminectomy syndrome, cervical region   . 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 Thoracic radiculopathy   . Lumbago   . Sciatica   . Primary localized osteoarthrosis, lower leg   . Pain in joint, lower leg    BP 110/69 mmHg  Pulse 96  Resp 14  SpO2 97%  Opioid Risk Score:   Fall Risk Score:  `1  Depression screen PHQ 2/9  [facility] 01/02/2016 06/13/2015 01/11/2015  Decreased Interest 1 0 0  Down, Depressed, Hopeless 1 1 1   PHQ - 2 Score 2 1 1   Altered sleeping 2 - 1  Tired, decreased energy 2 - 1  Change in appetite 0 - 0  Feeling bad or failure about yourself  0 - 0  Trouble concentrating 0 - 0  Moving slowly or fidgety/restless 0 - 0  Suicidal thoughts 0 - 0  PHQ-9 Score 6 - 3  Difficult doing work/chores Somewhat difficult - -   '  Review of Systems  All other systems reviewed and are negative.      Objective:  Physical Exam  Constitutional: He is oriented to person, place, and time. He appears well-developed and well-nourished.  HENT:  Head: Normocephalic and atraumatic.  Neck: Normal range of motion. Neck supple.  Denies cervical tenderness  Cardiovascular: Normal rate and regular rhythm.   Pulmonary/Chest: Effort normal and breath sounds normal.  Musculoskeletal:  Normal Muscle Bulk and Muscle Testing Reveals: Upper Extremities: Full ROM and Muscle Strength 5/5 Thoracic Paraspinal Tenderness: T-10- T-12 Lumbar Paraspinal Hypersensitivity Lower Extremities: Full ROM and Muscle Strength 5/5 Arises from chair with ease Narrow Based Gait  Neurological: He is alert and oriented to person, place, and  time.  Skin: Skin is warm and dry.  Psychiatric: He has a normal mood and affect.  Nursing note and vitals reviewed.         Assessment & Plan:  1. Cervical postlaminectomy syndrome, Hx of ACDF C4-5, C6-7, C7-T1, 02/2008. Continue current medication and alternate using heat and ice therapy.  2. Spondylosis of L-spine . Continue current treatment regime.  3. Chronic postoperative pain:  Refilled: Opana 10 mg one tablet twice a day #60 and Oxycodone 5/325mg  one tablet twice a day #60. Second script given for the following month. 4. 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 Continue Gabapentin, Keep blood sugars under tight control. Continue to monitor.  5. Thoracic Radiculopathy: Continue Gabapentin.  20 minutes of face to face patient care time was spent. All questions were encouraged and answered.  F/U in 2 months

## 2016-02-04 LAB — TOXASSURE SELECT,+ANTIDEPR,UR: PDF: 0

## 2016-02-05 NOTE — Progress Notes (Signed)
Urine drug screen for this encounter is consistent for prescribed medication 

## 2016-04-02 ENCOUNTER — Ambulatory Visit (HOSPITAL_BASED_OUTPATIENT_CLINIC_OR_DEPARTMENT_OTHER): Payer: BLUE CROSS/BLUE SHIELD | Admitting: Physical Medicine & Rehabilitation

## 2016-04-02 ENCOUNTER — Encounter: Payer: BLUE CROSS/BLUE SHIELD | Attending: Physical Medicine & Rehabilitation

## 2016-04-02 ENCOUNTER — Encounter: Payer: Self-pay | Admitting: Physical Medicine & Rehabilitation

## 2016-04-02 VITALS — BP 123/71 | HR 115 | Resp 15

## 2016-04-02 DIAGNOSIS — M5414 Radiculopathy, thoracic region: Secondary | ICD-10-CM | POA: Insufficient documentation

## 2016-04-02 DIAGNOSIS — M5430 Sciatica, unspecified side: Secondary | ICD-10-CM | POA: Diagnosis not present

## 2016-04-02 DIAGNOSIS — M47896 Other spondylosis, lumbar region: Secondary | ICD-10-CM | POA: Insufficient documentation

## 2016-04-02 DIAGNOSIS — M961 Postlaminectomy syndrome, not elsewhere classified: Secondary | ICD-10-CM | POA: Diagnosis not present

## 2016-04-02 DIAGNOSIS — M797 Fibromyalgia: Secondary | ICD-10-CM

## 2016-04-02 DIAGNOSIS — M5489 Other dorsalgia: Secondary | ICD-10-CM | POA: Diagnosis not present

## 2016-04-02 DIAGNOSIS — M79669 Pain in unspecified lower leg: Secondary | ICD-10-CM | POA: Insufficient documentation

## 2016-04-02 DIAGNOSIS — M7918 Myalgia, other site: Secondary | ICD-10-CM

## 2016-04-02 DIAGNOSIS — Z760 Encounter for issue of repeat prescription: Secondary | ICD-10-CM | POA: Insufficient documentation

## 2016-04-02 DIAGNOSIS — E119 Type 2 diabetes mellitus without complications: Secondary | ICD-10-CM | POA: Diagnosis not present

## 2016-04-02 DIAGNOSIS — M5134 Other intervertebral disc degeneration, thoracic region: Secondary | ICD-10-CM | POA: Insufficient documentation

## 2016-04-02 DIAGNOSIS — M179 Osteoarthritis of knee, unspecified: Secondary | ICD-10-CM | POA: Diagnosis not present

## 2016-04-02 DIAGNOSIS — G8929 Other chronic pain: Secondary | ICD-10-CM | POA: Diagnosis not present

## 2016-04-02 DIAGNOSIS — M5135 Other intervertebral disc degeneration, thoracolumbar region: Secondary | ICD-10-CM | POA: Diagnosis not present

## 2016-04-02 MED ORDER — OXYCODONE-ACETAMINOPHEN 5-325 MG PO TABS: 1.0000 | ORAL_TABLET | Freq: Two times a day (BID) | ORAL | Status: DC

## 2016-04-02 MED ORDER — OPANA ER 10 MG PO T12A: 10.0000 mg | EXTENDED_RELEASE_TABLET | Freq: Two times a day (BID) | ORAL | Status: DC

## 2016-04-02 NOTE — Patient Instructions (Addendum)
We discussed switching you from Opana ER to OxyContin 20 mg twice a day  Other option is to Increase Percocet to 7.5 mg and give it 4 times per day

## 2016-04-02 NOTE — Progress Notes (Signed)
40 year old male with history of chronic neck and low back pain. Patient has had some increasing pain in the right trapezius area. No new trauma to the area. No new activities. Patient independent with all self-care and mobility. Also does his own house work. He is looking for full-time employment opportunity.  No new medical problems since last month.  Past history significant for cervical fusion.  Review of systems no evidence of nausea vomiting constipation. No breathing issues.  Examination general no acute distress Mood and affect appropriate Motor strength is 5/5 bilateral deltoid bicep tricep grip, hip flexor and knee extensor and ankle dorsal flexor plantar flexor Patient with 75% range lateral bending rotation and flexion extension. Cervical spine. Tenderness to palpation in bilateral upper trapezius however worse on the right side. Pain in the right upper medial scapular border as well as bilateral T7 paraspinal muscles.  1. Cervical postlaminectomy syndrome Status post C4-5, C6-7 C7-T1 ACDF Continue Opana ER 10 mg twice a day. We discussed that Opana ER may be taken off the market. Alternatively he can be trialed on OxyContin 20 mg twice a day  Currently on Percocet 5 mg twice a day  2. Cervical myofascial pain may benefit from trigger point injection today  Trigger Point Injection  Indication: Cervical Myofascial pain not relieved by medication management and other conservative care.  Informed consent was obtained after describing risk and benefits of the procedure with the patient, this includes bleeding, bruising, infection and medication side effects.  The patient wishes to proceed and has given written consent.  The patient was placed in a Seated position.  The Right trapezius, right levator, bilateral T7 paraspinal area was marked and prepped with Betadine.  It was entered with a 25-gauge 1-1/2 inch needle and 1 mL of 1% lidocaine was injected into each of 4 trigger points,  after negative draw back for blood.  The patient tolerated the procedure well.  Post procedure instructions were given.

## 2016-04-09 ENCOUNTER — Other Ambulatory Visit: Payer: Self-pay | Admitting: Physical Medicine & Rehabilitation

## 2016-04-09 ENCOUNTER — Other Ambulatory Visit: Payer: Self-pay | Admitting: Registered Nurse

## 2016-05-07 ENCOUNTER — Encounter: Payer: BLUE CROSS/BLUE SHIELD | Attending: Physical Medicine & Rehabilitation | Admitting: Registered Nurse

## 2016-05-07 ENCOUNTER — Encounter: Payer: Self-pay | Admitting: Registered Nurse

## 2016-05-07 VITALS — BP 112/72 | HR 102 | Resp 14

## 2016-05-07 DIAGNOSIS — M47896 Other spondylosis, lumbar region: Secondary | ICD-10-CM | POA: Diagnosis not present

## 2016-05-07 DIAGNOSIS — M5134 Other intervertebral disc degeneration, thoracic region: Secondary | ICD-10-CM | POA: Insufficient documentation

## 2016-05-07 DIAGNOSIS — M5135 Other intervertebral disc degeneration, thoracolumbar region: Secondary | ICD-10-CM | POA: Insufficient documentation

## 2016-05-07 DIAGNOSIS — M7918 Myalgia, other site: Secondary | ICD-10-CM

## 2016-05-07 DIAGNOSIS — M79669 Pain in unspecified lower leg: Secondary | ICD-10-CM | POA: Diagnosis not present

## 2016-05-07 DIAGNOSIS — E119 Type 2 diabetes mellitus without complications: Secondary | ICD-10-CM | POA: Insufficient documentation

## 2016-05-07 DIAGNOSIS — M47817 Spondylosis without myelopathy or radiculopathy, lumbosacral region: Secondary | ICD-10-CM

## 2016-05-07 DIAGNOSIS — M5414 Radiculopathy, thoracic region: Secondary | ICD-10-CM | POA: Diagnosis not present

## 2016-05-07 DIAGNOSIS — G8929 Other chronic pain: Secondary | ICD-10-CM | POA: Insufficient documentation

## 2016-05-07 DIAGNOSIS — M961 Postlaminectomy syndrome, not elsewhere classified: Secondary | ICD-10-CM

## 2016-05-07 DIAGNOSIS — M797 Fibromyalgia: Secondary | ICD-10-CM | POA: Diagnosis not present

## 2016-05-07 DIAGNOSIS — M542 Cervicalgia: Secondary | ICD-10-CM | POA: Diagnosis not present

## 2016-05-07 DIAGNOSIS — Z5181 Encounter for therapeutic drug level monitoring: Secondary | ICD-10-CM

## 2016-05-07 DIAGNOSIS — Z760 Encounter for issue of repeat prescription: Secondary | ICD-10-CM | POA: Insufficient documentation

## 2016-05-07 DIAGNOSIS — M179 Osteoarthritis of knee, unspecified: Secondary | ICD-10-CM | POA: Diagnosis not present

## 2016-05-07 DIAGNOSIS — M5489 Other dorsalgia: Secondary | ICD-10-CM | POA: Insufficient documentation

## 2016-05-07 DIAGNOSIS — M5430 Sciatica, unspecified side: Secondary | ICD-10-CM | POA: Diagnosis not present

## 2016-05-07 DIAGNOSIS — Z79899 Other long term (current) drug therapy: Secondary | ICD-10-CM

## 2016-05-07 DIAGNOSIS — G894 Chronic pain syndrome: Secondary | ICD-10-CM

## 2016-05-07 MED ORDER — OXYCODONE-ACETAMINOPHEN 5-325 MG PO TABS: 1.0000 | ORAL_TABLET | Freq: Two times a day (BID) | ORAL | Status: DC

## 2016-05-07 MED ORDER — OPANA ER 10 MG PO T12A: 10.0000 mg | EXTENDED_RELEASE_TABLET | Freq: Two times a day (BID) | ORAL | Status: DC

## 2016-05-07 NOTE — Addendum Note (Signed)
Addended by: Angela NevinWESSLING, Aaleah Hirsch D on: 05/07/2016 12:09 PM   Modules accepted: Orders

## 2016-05-07 NOTE — Progress Notes (Signed)
Subjective:    Patient ID: David [name], male    DOB: 1976-05-31, 40 y.o.   MRN: [id number]  HPI: Mr.[name] Meredith LeedsJohn O [name] is a 40 year old male who returns for follow up for chronic pain and medication refill.He states his pain is located in his neck, right shoulder,mid-lower back, right hip and lower extremities. Also states he's having generalized pain all over.He rates his pain 5.His current exercise regime is walking and performing stretching exercises.  S/P Trigger Point with no relief noted.  Pain Inventory Average Pain 5 Pain Right Now 5 My pain is intermittent, constant, sharp, burning, dull, stabbing, tingling and aching  In the last 24 hours, has pain interfered with the following? General activity 6 Relation with others 5 Enjoyment of life 6 What TIME of day is your pain at its worst? all Sleep (in general) Fair  Pain is worse with: walking, bending, sitting, inactivity, standing and some activites Pain improves with: rest, heat/ice, therapy/exercise, pacing activities and medication Relief from Meds: 5  Mobility walk without assistance how many minutes can you walk? 60 ability to climb steps?  yes do you drive?  yes  Function not employed: date last employed .  Neuro/Psych numbness tingling spasms depression anxiety  Prior Studies Any changes since last visit?  no  Physicians involved in your care Any changes since last visit?  no   Family History  Problem Relation Age of Onset  . Arthritis Mother   . Heart disease Mother   . Diabetes Mother   . Heart disease Father   . Diabetes Father    Social History   Social History  . Marital Status: Married    Spouse Name: N/A  . Number of Children: N/A  . Years of Education: N/A   Social History Main Topics  . Smoking status: Never Smoker   . Smokeless tobacco: Never Used  . Alcohol Use: No  . Drug Use: No  . Sexual Activity: Not Asked   Other Topics Concern  . None   Social History Narrative    History reviewed. No pertinent past surgical history. Past Medical History  Diagnosis Date  . Chronic pain syndrome   . Postlaminectomy syndrome, cervical region   . 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 Thoracic radiculopathy   . Lumbago   . Sciatica   . Primary localized osteoarthrosis, lower leg   . Pain in joint, lower leg    BP 112/72 mmHg  Pulse 108  Resp 14  SpO2 95%  Opioid Risk Score:   Fall Risk Score:  `1  Depression screen PHQ 2/9  [facility] 01/02/2016 06/13/2015 01/11/2015  Decreased Interest 1 0 0  Down, Depressed, Hopeless 1 1 1   PHQ - 2 Score 2 1 1   Altered sleeping 2 - 1  Tired, decreased energy 2 - 1  Change in appetite 0 - 0  Feeling bad or failure about yourself  0 - 0  Trouble concentrating 0 - 0  Moving slowly or fidgety/restless 0 - 0  Suicidal thoughts 0 - 0  PHQ-9 Score 6 - 3  Difficult doing work/chores Somewhat difficult - -     Review of Systems  HENT: Negative.   Eyes: Negative.   Respiratory: Negative.   Cardiovascular: Negative.   Gastrointestinal: Negative.   Endocrine: Negative.   Genitourinary: Negative.   Musculoskeletal: Positive for back pain and neck pain.       Spasms  Allergic/Immunologic: Negative.  Neurological: Positive for numbness.       Tingling  Psychiatric/Behavioral: Positive for dysphoric mood. The patient is nervous/anxious.   All other systems reviewed and are negative.      Objective:   Physical Exam  Constitutional: He is oriented to person, place, and time. He appears well-developed and well-nourished.  HENT:  Head: Normocephalic and atraumatic.  Neck: Normal range of motion. Neck supple.  Cardiovascular: Normal rate and regular rhythm.   Pulmonary/Chest: Effort normal and breath sounds normal.  Musculoskeletal:  Normal Muscle Bulk and Muscle Testing Reveals: Upper Extremities: Full ROM and Muscle Strength 5/5 Lumbar Hypersensitivity Lower  Extremities: Full ROM and Muscle Strength 5/5 Arises from chair with ease Narrow Based Gait  Neurological: He is alert and oriented to person, place, and time.  Skin: Skin is warm and dry.  Psychiatric: He has a normal mood and affect.  Nursing note and vitals reviewed.         Assessment & Plan:  1. Cervical postlaminectomy syndrome, Hx of ACDF C4-5, C6-7, C7-T1, 02/2008. Continue current medication and alternate using heat and ice therapy.  2. Spondylosis of L-spine . Continue current treatment regime.  3. Chronic postoperative pain:  Refilled: Opana 10 mg one tablet twice a day #60 and Oxycodone 5/325mg  one tablet twice a day #60.  4. 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 Continue Gabapentin, Keep blood sugars under tight control. Continue to monitor.  5. Thoracic Radiculopathy: Continue Gabapentin.  20 minutes of face to face patient care time was spent. All questions were encouraged and answered. F/U in 1 month

## 2016-05-15 LAB — TOXASSURE SELECT,+ANTIDEPR,UR: PDF: 0

## 2016-05-21 NOTE — Progress Notes (Signed)
Urine drug screen for this encounter is consistent for prescribed medications.   

## 2016-06-03 ENCOUNTER — Encounter: Payer: BLUE CROSS/BLUE SHIELD | Attending: Physical Medicine & Rehabilitation | Admitting: Registered Nurse

## 2016-06-03 ENCOUNTER — Encounter: Payer: Self-pay | Admitting: Registered Nurse

## 2016-06-03 VITALS — BP 109/72 | HR 103 | Temp 98.4°F

## 2016-06-03 DIAGNOSIS — M5489 Other dorsalgia: Secondary | ICD-10-CM | POA: Diagnosis not present

## 2016-06-03 DIAGNOSIS — M47817 Spondylosis without myelopathy or radiculopathy, lumbosacral region: Secondary | ICD-10-CM | POA: Diagnosis not present

## 2016-06-03 DIAGNOSIS — G894 Chronic pain syndrome: Secondary | ICD-10-CM | POA: Diagnosis not present

## 2016-06-03 DIAGNOSIS — M797 Fibromyalgia: Secondary | ICD-10-CM

## 2016-06-03 DIAGNOSIS — M5430 Sciatica, unspecified side: Secondary | ICD-10-CM | POA: Insufficient documentation

## 2016-06-03 DIAGNOSIS — M179 Osteoarthritis of knee, unspecified: Secondary | ICD-10-CM | POA: Insufficient documentation

## 2016-06-03 DIAGNOSIS — M47896 Other spondylosis, lumbar region: Secondary | ICD-10-CM | POA: Insufficient documentation

## 2016-06-03 DIAGNOSIS — M79669 Pain in unspecified lower leg: Secondary | ICD-10-CM | POA: Diagnosis not present

## 2016-06-03 DIAGNOSIS — E119 Type 2 diabetes mellitus without complications: Secondary | ICD-10-CM | POA: Insufficient documentation

## 2016-06-03 DIAGNOSIS — M5414 Radiculopathy, thoracic region: Secondary | ICD-10-CM | POA: Insufficient documentation

## 2016-06-03 DIAGNOSIS — G8929 Other chronic pain: Secondary | ICD-10-CM | POA: Diagnosis not present

## 2016-06-03 DIAGNOSIS — Z79899 Other long term (current) drug therapy: Secondary | ICD-10-CM

## 2016-06-03 DIAGNOSIS — M961 Postlaminectomy syndrome, not elsewhere classified: Secondary | ICD-10-CM | POA: Diagnosis not present

## 2016-06-03 DIAGNOSIS — Z760 Encounter for issue of repeat prescription: Secondary | ICD-10-CM | POA: Diagnosis not present

## 2016-06-03 DIAGNOSIS — M7918 Myalgia, other site: Secondary | ICD-10-CM

## 2016-06-03 DIAGNOSIS — M5134 Other intervertebral disc degeneration, thoracic region: Secondary | ICD-10-CM | POA: Diagnosis not present

## 2016-06-03 DIAGNOSIS — M5135 Other intervertebral disc degeneration, thoracolumbar region: Secondary | ICD-10-CM | POA: Insufficient documentation

## 2016-06-03 DIAGNOSIS — Z5181 Encounter for therapeutic drug level monitoring: Secondary | ICD-10-CM

## 2016-06-03 MED ORDER — GABAPENTIN 400 MG PO CAPS: ORAL_CAPSULE | ORAL | 0 refills | Status: DC

## 2016-06-03 MED ORDER — OPANA ER 10 MG PO T12A: 10.0000 mg | EXTENDED_RELEASE_TABLET | Freq: Two times a day (BID) | ORAL | 0 refills | Status: DC

## 2016-06-03 MED ORDER — OXYCODONE-ACETAMINOPHEN 5-325 MG PO TABS: 1.0000 | ORAL_TABLET | Freq: Two times a day (BID) | ORAL | 0 refills | Status: DC

## 2016-06-03 NOTE — Progress Notes (Signed)
Subjective:    Patient ID: David [name], male    DOB: Dec 23, 1975, 40 y.o.   MRN: [id number]  HPI: [name] is a 40 year old male who returns for follow up for chronic pain and medication refill.He states his pain is located in his neck, right shoulder,mid-lower back, right hip and lower extremities. He rates his pain 6.His current exercise regime is walking and performing stretching exercises.   Pain Inventory Average Pain 5 Pain Right Now 6 My pain is intermittent, constant, sharp, burning, dull, stabbing, tingling and aching  In the last 24 hours, has pain interfered with the following? General activity 5 Relation with others 5 Enjoyment of life 5 What TIME of day is your pain at its worst? all Sleep (in general) Fair  Pain is worse with: walking, bending, sitting, inactivity, standing and some activites Pain improves with: rest, heat/ice, therapy/exercise, pacing activities and medication Relief from Meds: 5  Mobility walk without assistance how many minutes can you walk? 60 ability to climb steps?  yes do you drive?  yes  Function not employed: date last employed 06/2015  Neuro/Psych numbness tingling spasms depression anxiety  Prior Studies Any changes since last visit?  no  Physicians involved in your care Any changes since last visit?  yes   Family History  Problem Relation Age of Onset  . Arthritis Mother   . Heart disease Mother   . Diabetes Mother   . Heart disease Father   . Diabetes Father    Social History   Social History  . Marital status: Married    Spouse name: N/A  . Number of children: N/A  . Years of education: N/A   Social History Main Topics  . Smoking status: Never Smoker  . Smokeless tobacco: Never Used  . Alcohol use No  . Drug use: No  . Sexual activity: Not on file   Other Topics Concern  . Not on file   Social History Narrative  . No narrative on file   No past surgical history on file. Past Medical  History:  Diagnosis Date  . Chronic pain syndrome   . 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 Lumbago   . Pain in joint, lower leg   . Postlaminectomy syndrome, cervical region   . Primary localized osteoarthrosis, lower leg   . Sciatica   . Thoracic radiculopathy    109/72  103  97% 98.4 temp Opioid Risk Score:   Fall Risk Score:  `1  Depression screen PHQ 2/9  Depression screen [facility] 2/9 01/02/2016 06/13/2015 01/11/2015  Decreased Interest 1 0 0  Down, Depressed, Hopeless 1 1 1   PHQ - 2 Score 2 1 1   Altered sleeping 2 - 1  Tired, decreased energy 2 - 1  Change in appetite 0 - 0  Feeling bad or failure about yourself  0 - 0  Trouble concentrating 0 - 0  Moving slowly or fidgety/restless 0 - 0  Suicidal thoughts 0 - 0  PHQ-9 Score 6 - 3  Difficult doing work/chores Somewhat difficult - -    Review of Systems  All other systems reviewed and are negative.      Objective:   Physical Exam  Constitutional: He is oriented to person, place, and time. He appears well-developed and well-nourished.  HENT:  Head: Normocephalic and atraumatic.  Neck: Normal range of motion. Neck supple.  Cardiovascular: Normal rate and regular rhythm.   Pulmonary/Chest: Effort normal and  breath sounds normal.  Musculoskeletal:  Normal Muscle Bulk and Muscle Testing Reveals: Upper Extremities: Full ROM and Muscle Strength 5/5 Thoracic Paraspinal Tenderness: T-3-T-4 T-7- T-9 Lumbar Hypersensitivity Lower Extremities: Full ROM and Muscle Strength 5/5 Arises from chair with ease Narrow based gait  Neurological: He is alert and oriented to person, place, and time.  Skin: Skin is warm and dry.  Psychiatric: He has a normal mood and affect.  Nursing note and vitals reviewed.         Assessment & Plan:  1. Cervical postlaminectomy syndrome, Hx of ACDF C4-5, C6-7, C7-T1, 02/2008. Continue current medication and alternate using heat and ice therapy.  2. Spondylosis of  L-spine . Continue current treatment regime.  3. Chronic postoperative pain:  Refilled: Opana 10 mg one tablet twice a day #60 and Oxycodone 5/325mg  one tablet twice a day #60. Second script given to accommodate scheduled appointment. 4. 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 Continue Gabapentin, Keep blood sugars under tight control. Continue to monitor.  5. Thoracic Radiculopathy: Continue Gabapentin.  20 minutes of face to face patient care time was spent. All questions were encouraged and answered. F/U in 1 month

## 2016-07-09 ENCOUNTER — Encounter: Payer: Self-pay | Admitting: Registered Nurse

## 2016-07-09 ENCOUNTER — Encounter: Payer: BLUE CROSS/BLUE SHIELD | Attending: Physical Medicine & Rehabilitation | Admitting: Registered Nurse

## 2016-07-09 VITALS — BP 109/72 | HR 95 | Resp 16

## 2016-07-09 DIAGNOSIS — G894 Chronic pain syndrome: Secondary | ICD-10-CM | POA: Diagnosis not present

## 2016-07-09 DIAGNOSIS — M5430 Sciatica, unspecified side: Secondary | ICD-10-CM | POA: Diagnosis not present

## 2016-07-09 DIAGNOSIS — M47817 Spondylosis without myelopathy or radiculopathy, lumbosacral region: Secondary | ICD-10-CM

## 2016-07-09 DIAGNOSIS — M179 Osteoarthritis of knee, unspecified: Secondary | ICD-10-CM | POA: Diagnosis not present

## 2016-07-09 DIAGNOSIS — M79669 Pain in unspecified lower leg: Secondary | ICD-10-CM | POA: Diagnosis not present

## 2016-07-09 DIAGNOSIS — Z760 Encounter for issue of repeat prescription: Secondary | ICD-10-CM | POA: Insufficient documentation

## 2016-07-09 DIAGNOSIS — M7918 Myalgia, other site: Secondary | ICD-10-CM

## 2016-07-09 DIAGNOSIS — G8929 Other chronic pain: Secondary | ICD-10-CM | POA: Diagnosis not present

## 2016-07-09 DIAGNOSIS — M961 Postlaminectomy syndrome, not elsewhere classified: Secondary | ICD-10-CM | POA: Diagnosis not present

## 2016-07-09 DIAGNOSIS — M5414 Radiculopathy, thoracic region: Secondary | ICD-10-CM | POA: Diagnosis not present

## 2016-07-09 DIAGNOSIS — M5134 Other intervertebral disc degeneration, thoracic region: Secondary | ICD-10-CM | POA: Insufficient documentation

## 2016-07-09 DIAGNOSIS — M47896 Other spondylosis, lumbar region: Secondary | ICD-10-CM | POA: Diagnosis not present

## 2016-07-09 DIAGNOSIS — M797 Fibromyalgia: Secondary | ICD-10-CM | POA: Diagnosis not present

## 2016-07-09 DIAGNOSIS — E119 Type 2 diabetes mellitus without complications: Secondary | ICD-10-CM | POA: Diagnosis not present

## 2016-07-09 DIAGNOSIS — M5489 Other dorsalgia: Secondary | ICD-10-CM | POA: Insufficient documentation

## 2016-07-09 DIAGNOSIS — Z79899 Other long term (current) drug therapy: Secondary | ICD-10-CM

## 2016-07-09 DIAGNOSIS — M5135 Other intervertebral disc degeneration, thoracolumbar region: Secondary | ICD-10-CM | POA: Insufficient documentation

## 2016-07-09 DIAGNOSIS — Z5181 Encounter for therapeutic drug level monitoring: Secondary | ICD-10-CM

## 2016-07-09 MED ORDER — OXYCODONE-ACETAMINOPHEN 5-325 MG PO TABS: 1.0000 | ORAL_TABLET | Freq: Two times a day (BID) | ORAL | 0 refills | Status: DC

## 2016-07-09 MED ORDER — OPANA ER 10 MG PO T12A: 10.0000 mg | EXTENDED_RELEASE_TABLET | Freq: Two times a day (BID) | ORAL | 0 refills | Status: DC

## 2016-07-09 NOTE — Progress Notes (Signed)
Subjective:    Patient ID: David [name], male    DOB: [birth year], 40 y.o.   MRN: [id number]  HPI: [name] is a 40 year old male who returns for follow up for chronic pain and medication refill.He states his pain is located in his neck and mid-lower back.He rates his pain 5. His current exercise regime is walking and performing stretching exercises.   Pain Inventory Average Pain 5 Pain Right Now 5 My pain is intermittent, constant, sharp, burning, dull, stabbing, tingling and aching  In the last 24 hours, has pain interfered with the following? General activity 6 Relation with others 5 Enjoyment of life 6 What TIME of day is your pain at its worst? all Sleep (in general) Fair  Pain is worse with: walking, bending, sitting, inactivity, standing and some activites Pain improves with: rest, heat/ice, therapy/exercise, pacing activities and medication Relief from Meds: 5  Mobility walk without assistance how many minutes can you walk? 60 ability to climb steps?  yes do you drive?  yes  Function not employed: date last employed . Do you have any goals in this area?  yes  Neuro/Psych numbness tingling spasms depression anxiety  Prior Studies Any changes since last visit?  no  Physicians involved in your care Any changes since last visit?  no   Family History  Problem Relation Age of Onset  . Arthritis Mother   . Heart disease Mother   . Diabetes Mother   . Heart disease Father   . Diabetes Father    Social History   Social History  . Marital status: Married    Spouse name: N/A  . Number of children: N/A  . Years of education: N/A   Social History Main Topics  . Smoking status: Never Smoker  . Smokeless tobacco: Never Used  . Alcohol use No  . Drug use: No  . Sexual activity: Not Asked   Other Topics Concern  . None   Social History Narrative  . None   History reviewed. No pertinent surgical history. Past Medical History:  Diagnosis  Date  . Chronic pain syndrome   . 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 Lumbago   . Pain in joint, lower leg   . Postlaminectomy syndrome, cervical region   . Primary localized osteoarthrosis, lower leg   . Sciatica   . Thoracic radiculopathy    BP 109/72 (BP Location: Right Arm, Patient Position: Sitting, Cuff Size: Large)   Pulse 95   Resp 16   SpO2 98%   Opioid Risk Score:   Fall Risk Score:  `1  Depression screen PHQ 2/9  [facility] 01/02/2016 06/13/2015 01/11/2015  Decreased Interest 1 0 0  Down, Depressed, Hopeless 1 1 1   PHQ - 2 Score 2 1 1   Altered sleeping 2 - 1  Tired, decreased energy 2 - 1  Change in appetite 0 - 0  Feeling bad or failure about yourself  0 - 0  Trouble concentrating 0 - 0  Moving slowly or fidgety/restless 0 - 0  Suicidal thoughts 0 - 0  PHQ-9 Score 6 - 3  Difficult doing work/chores Somewhat difficult - -    Review of Systems  HENT: Negative.   Eyes: Negative.   Respiratory: Negative.   Cardiovascular: Negative.   Gastrointestinal: Negative.   Endocrine: Negative.   Genitourinary: Negative.   Musculoskeletal: Positive for back pain.       Spasms  Neurological: Positive for numbness.       Tingling  Hematological: Negative.   Psychiatric/Behavioral: Negative.   All other systems reviewed and are negative.      Objective:   Physical Exam  Constitutional: He is oriented to person, place, and time. He appears well-developed and well-nourished.  HENT:  Head: Normocephalic and atraumatic.  Neck: Normal range of motion. Neck supple.  Cardiovascular: Normal rate and regular rhythm.   Pulmonary/Chest: Effort normal and breath sounds normal.  Musculoskeletal:  Normal Muscle Bulk and Muscle Testing Reveals:  Upper Extremities: Full ROM and Muscle Strength 5/5 Lumbar Hypersensitivity Mainly Left Side Lower Extremities: Full ROM and Muscle Strength 5/5 Arises from table with ease Narrow Based  Gait    Neurological: He is alert and oriented to person, place, and time.  Skin: Skin is warm and dry.  Psychiatric: He has a normal mood and affect.  Nursing note and vitals reviewed.         Assessment & Plan:  1. Cervical postlaminectomy syndrome, Hx of ACDF C4-5, C6-7, C7-T1, 02/2008. Continue current medication and alternate using heat and ice therapy.  2. Spondylosis of L-spine . Continue current treatment regime.  3. Chronic postoperative pain:  Refilled: Opana 10 mg one tablet twice a day #60 and Oxycodone 5/325mg  one tablet twice a day #60.  4. 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 Continue Gabapentin, Keep blood sugars under tight control. Continue to monitor.  5. Thoracic Radiculopathy: No complaints today. Continue Gabapentin.  20 minutes of face to face patient care time was spent. All questions were encouraged and answered. F/U in 1 month

## 2016-08-13 ENCOUNTER — Encounter: Payer: Self-pay | Admitting: Registered Nurse

## 2016-08-13 ENCOUNTER — Encounter: Payer: BLUE CROSS/BLUE SHIELD | Attending: Physical Medicine & Rehabilitation | Admitting: Registered Nurse

## 2016-08-13 VITALS — BP 106/71 | HR 95

## 2016-08-13 DIAGNOSIS — M5430 Sciatica, unspecified side: Secondary | ICD-10-CM | POA: Insufficient documentation

## 2016-08-13 DIAGNOSIS — M47896 Other spondylosis, lumbar region: Secondary | ICD-10-CM | POA: Insufficient documentation

## 2016-08-13 DIAGNOSIS — M5489 Other dorsalgia: Secondary | ICD-10-CM | POA: Insufficient documentation

## 2016-08-13 DIAGNOSIS — Z760 Encounter for issue of repeat prescription: Secondary | ICD-10-CM | POA: Insufficient documentation

## 2016-08-13 DIAGNOSIS — M5412 Radiculopathy, cervical region: Secondary | ICD-10-CM

## 2016-08-13 DIAGNOSIS — M5414 Radiculopathy, thoracic region: Secondary | ICD-10-CM

## 2016-08-13 DIAGNOSIS — M179 Osteoarthritis of knee, unspecified: Secondary | ICD-10-CM | POA: Insufficient documentation

## 2016-08-13 DIAGNOSIS — M791 Myalgia: Secondary | ICD-10-CM

## 2016-08-13 DIAGNOSIS — M961 Postlaminectomy syndrome, not elsewhere classified: Secondary | ICD-10-CM

## 2016-08-13 DIAGNOSIS — M79669 Pain in unspecified lower leg: Secondary | ICD-10-CM | POA: Diagnosis not present

## 2016-08-13 DIAGNOSIS — M47817 Spondylosis without myelopathy or radiculopathy, lumbosacral region: Secondary | ICD-10-CM | POA: Diagnosis not present

## 2016-08-13 DIAGNOSIS — G894 Chronic pain syndrome: Secondary | ICD-10-CM

## 2016-08-13 DIAGNOSIS — M5135 Other intervertebral disc degeneration, thoracolumbar region: Secondary | ICD-10-CM | POA: Insufficient documentation

## 2016-08-13 DIAGNOSIS — M7918 Myalgia, other site: Secondary | ICD-10-CM

## 2016-08-13 DIAGNOSIS — Z79899 Other long term (current) drug therapy: Secondary | ICD-10-CM

## 2016-08-13 DIAGNOSIS — E119 Type 2 diabetes mellitus without complications: Secondary | ICD-10-CM | POA: Insufficient documentation

## 2016-08-13 DIAGNOSIS — G8929 Other chronic pain: Secondary | ICD-10-CM | POA: Diagnosis not present

## 2016-08-13 DIAGNOSIS — M5134 Other intervertebral disc degeneration, thoracic region: Secondary | ICD-10-CM | POA: Diagnosis not present

## 2016-08-13 DIAGNOSIS — Z5181 Encounter for therapeutic drug level monitoring: Secondary | ICD-10-CM

## 2016-08-13 MED ORDER — OPANA ER 10 MG PO T12A: 10.0000 mg | EXTENDED_RELEASE_TABLET | Freq: Two times a day (BID) | ORAL | 0 refills | Status: DC

## 2016-08-13 MED ORDER — OXYCODONE-ACETAMINOPHEN 5-325 MG PO TABS: 1.0000 | ORAL_TABLET | Freq: Two times a day (BID) | ORAL | 0 refills | Status: DC

## 2016-08-13 NOTE — Progress Notes (Signed)
Subjective:    Patient ID: David [name], male    DOB: September 24, 1976, 40 y.o.   MRN: [id number]  HPI: [name] is a 40 year old male who returns for follow up for chronic pain and medication refill.He states his pain is located in his neck and mid-lower back radiating into his left hip and lower extremity laterally. He rates his pain 5. His current exercise regime is walking and performing stretching exercises.   Pain Inventory Average Pain 5 Pain Right Now 5 My pain is intermittent, constant, sharp, burning, dull, stabbing, tingling and aching  In the last 24 hours, has pain interfered with the following? General activity 6 Relation with others 5 Enjoyment of life 6 What TIME of day is your pain at its worst? All Sleep (in general) Fair  Pain is worse with: walking, bending, sitting, inactivity, standing and some activites Pain improves with: rest, heat/ice, therapy/exercise, pacing activities and medication Relief from Meds: 5  Mobility walk without assistance how many minutes can you walk? 60 ability to climb steps?  yes do you drive?  yes  Function not employed: date last employed 06/2005  Neuro/Psych numbness tingling spasms depression anxiety  Prior Studies Any changes since last visit?  no  Physicians involved in your care Any changes since last visit?  no   Family History  Problem Relation Age of Onset  . Arthritis Mother   . Heart disease Mother   . Diabetes Mother   . Heart disease Father   . Diabetes Father    Social History   Social History  . Marital status: Married    Spouse name: N/A  . Number of children: N/A  . Years of education: N/A   Social History Main Topics  . Smoking status: Never Smoker  . Smokeless tobacco: Never Used  . Alcohol use No  . Drug use: No  . Sexual activity: Not Asked   Other Topics Concern  . None   Social History Narrative  . None   History reviewed. No pertinent surgical history. Past Medical  History:  Diagnosis Date  . Chronic pain syndrome   . 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 Lumbago   . Pain in joint, lower leg   . Postlaminectomy syndrome, cervical region   . Primary localized osteoarthrosis, lower leg   . Sciatica   . Thoracic radiculopathy    BP 106/71   Pulse 95   SpO2 98%   Opioid Risk Score:   Fall Risk Score:  `1  Depression screen PHQ 2/9  Depression screen [facility] 2/9 01/02/2016 06/13/2015 01/11/2015  Decreased Interest 1 0 0  Down, Depressed, Hopeless 1 1 1   PHQ - 2 Score 2 1 1   Altered sleeping 2 - 1  Tired, decreased energy 2 - 1  Change in appetite 0 - 0  Feeling bad or failure about yourself  0 - 0  Trouble concentrating 0 - 0  Moving slowly or fidgety/restless 0 - 0  Suicidal thoughts 0 - 0  PHQ-9 Score 6 - 3  Difficult doing work/chores Somewhat difficult - -     Review of Systems  Neurological: Positive for numbness.       Tingling Spasms  Psychiatric/Behavioral: Positive for dysphoric mood. The patient is nervous/anxious.   All other systems reviewed and are negative.      Objective:   Physical Exam  Constitutional: He is oriented to person, place, and time. He appears well-developed and  well-nourished.  HENT:  Head: Normocephalic and atraumatic.  Neck: Normal range of motion. Neck supple.  Cervical Paraspinal Tenderness: C-5- C-6  Cardiovascular: Normal rate and regular rhythm.   Pulmonary/Chest: Effort normal and breath sounds normal.  Musculoskeletal:  Normal Muscle Bulk and Muscle Testing Reveals:  Upper Extremities: Full ROM and Muscle Strength 5/5 Thoracic Paraspinal Tenderness: T-10- T-12 Lumbar Paraspinal Tenderness: L-3- L-5 Lower Extremities: Full ROM and Muscle Strength 5/5 Arises from table with ease Narrow Based Gait   Neurological: He is alert and oriented to person, place, and time.  Skin: Skin is warm and dry.  Psychiatric: He has a normal mood and affect.  Nursing note and  vitals reviewed.         Assessment & Plan:  1. Cervical postlaminectomy syndrome, Hx of ACDF C4-5, C6-7, C7-T1, 02/2008. Continue current medication and alternate using heat and ice therapy.  2. Spondylosis of L-spine . Continue current treatment regime.  3. Chronic postoperative pain:  Refilled: Opana 10 mg one tablet twice a day #60 and Oxycodone 5/325mg  one tablet twice a day #60.  We will continue the opioid monitoring program, this consists of regular clinic visits, examinations, urine drug screen, pill counts as well as use of [place]. 4. 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 Continue Gabapentin, Keep blood sugars under tight control. Continue to monitor.  5. Thoracic Radiculopathy:Continue Gabapentin.  20 minutes of face to face patient care time was spent. All questions were encouraged and answered. F/U in 1 month

## 2016-09-20 IMAGING — CR DG CERVICAL SPINE WITH FLEX & EXTEND
8 series · 8 of 8 positions shown · non-contrast
Comparison: 12/25/2008.

CLINICAL DATA: Neck pain.

EXAM:
CERVICAL SPINE COMPLETE WITH FLEXION AND EXTENSION VIEWS

[w cervical spine lat]
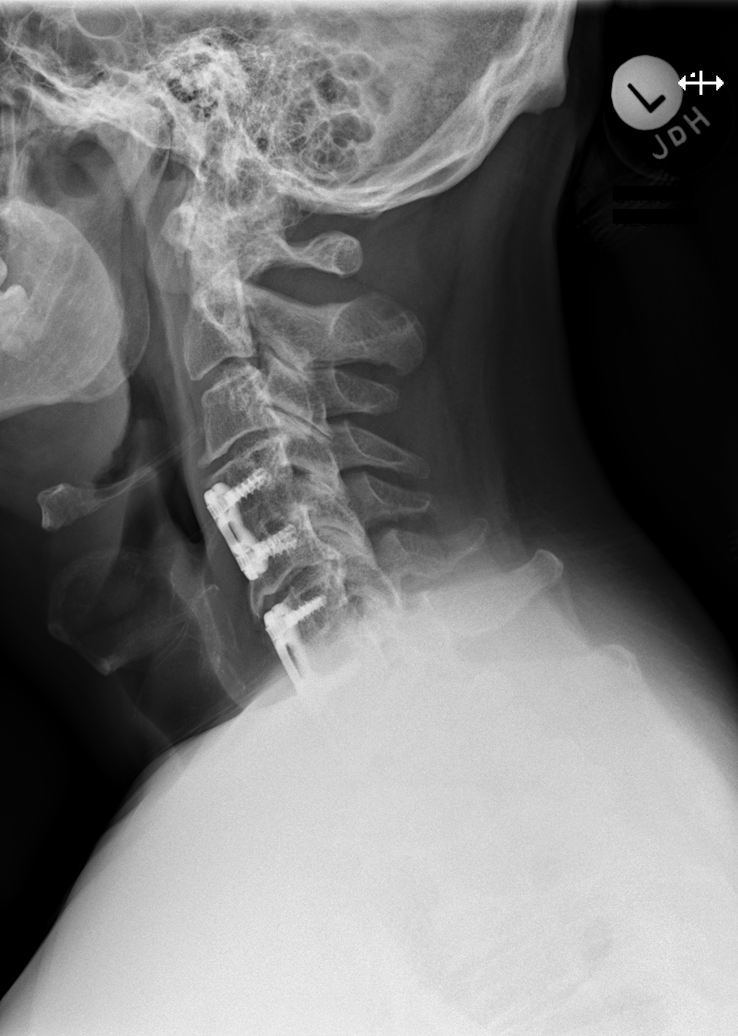

[w cervical spine flexion]
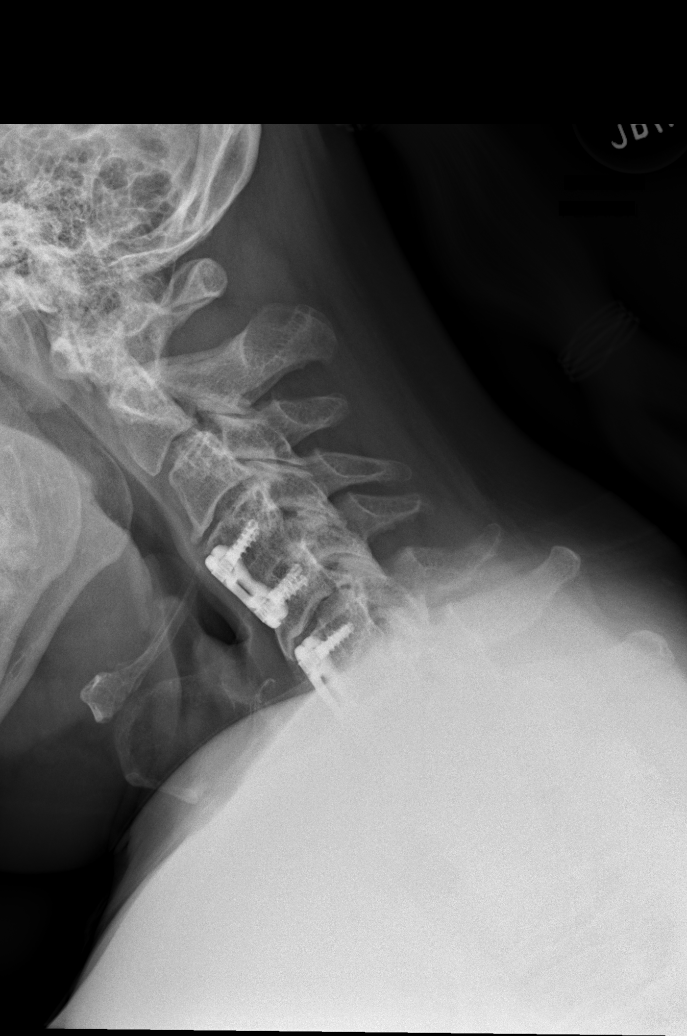

[w cervical spine extension]
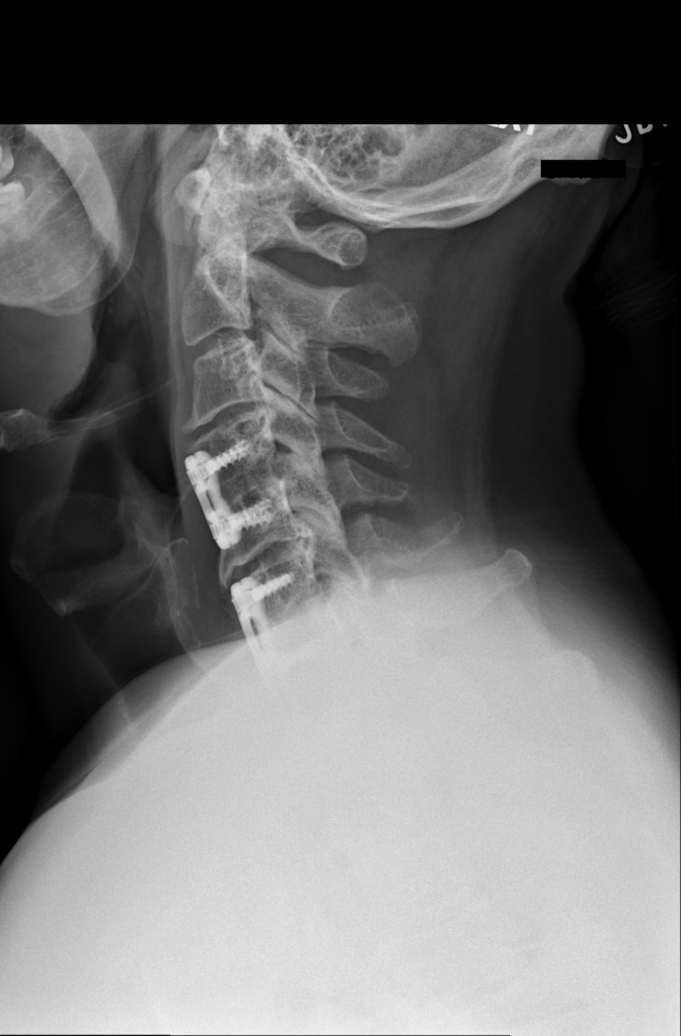

[w cervical spine ap_obl (1 of 2)]
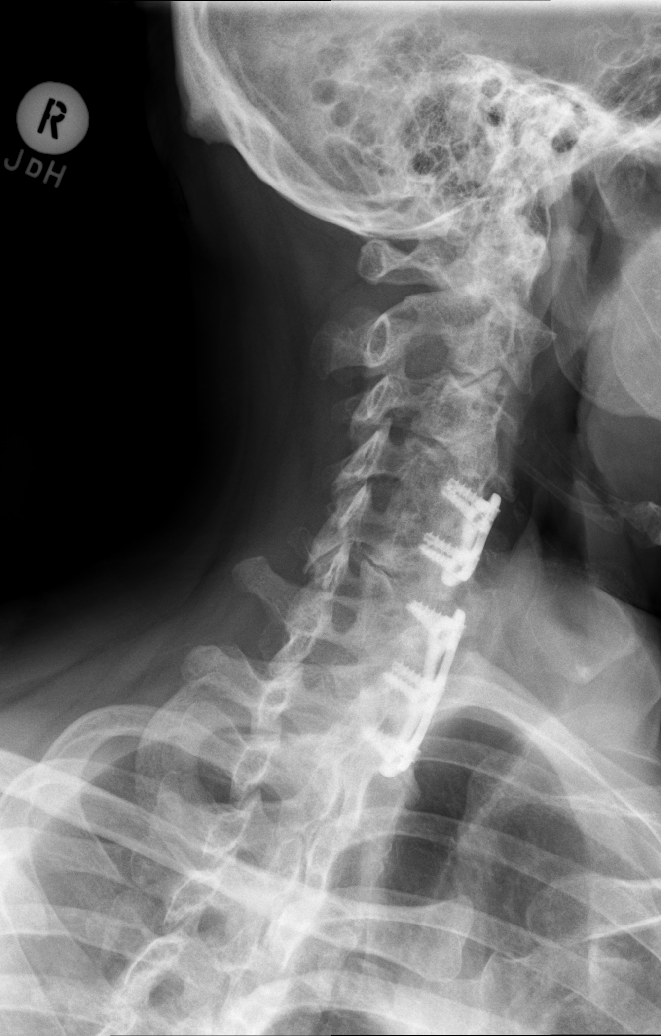

[w cervical spine ap_obl (2 of 2)]
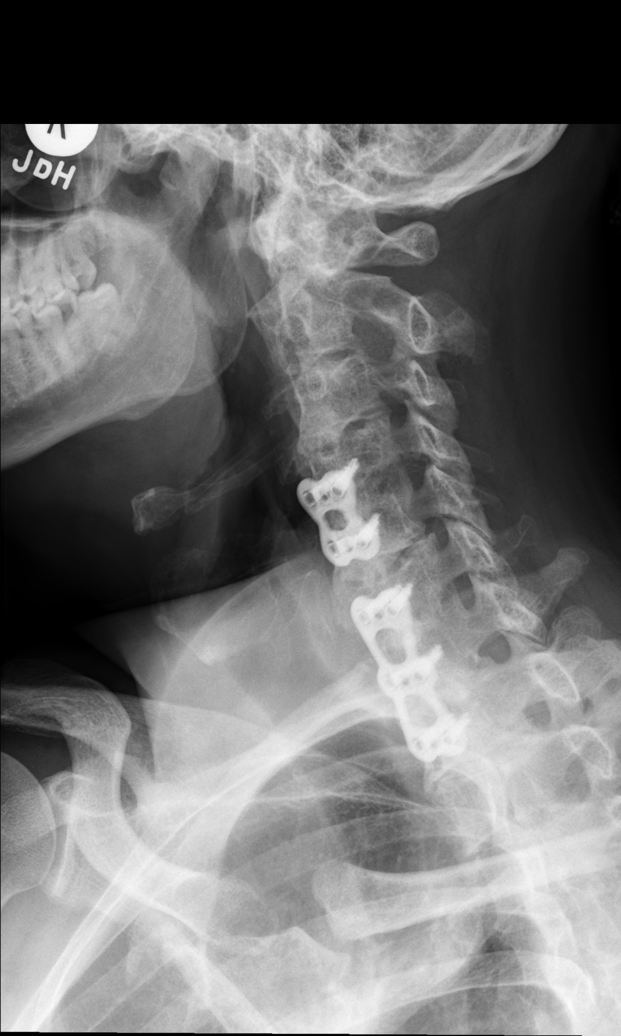

[w cervical swimmers]
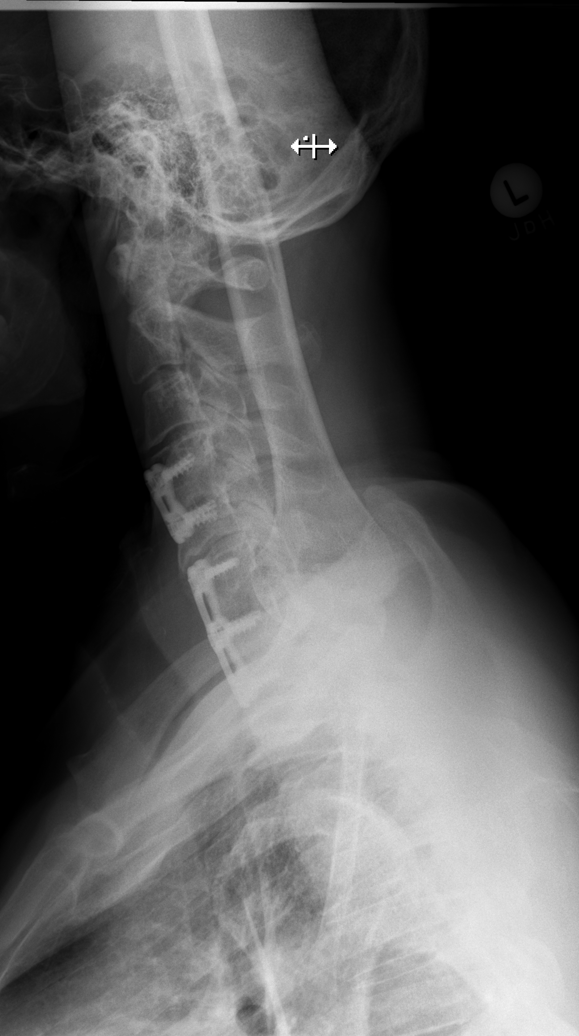

[w cervical spine ap]
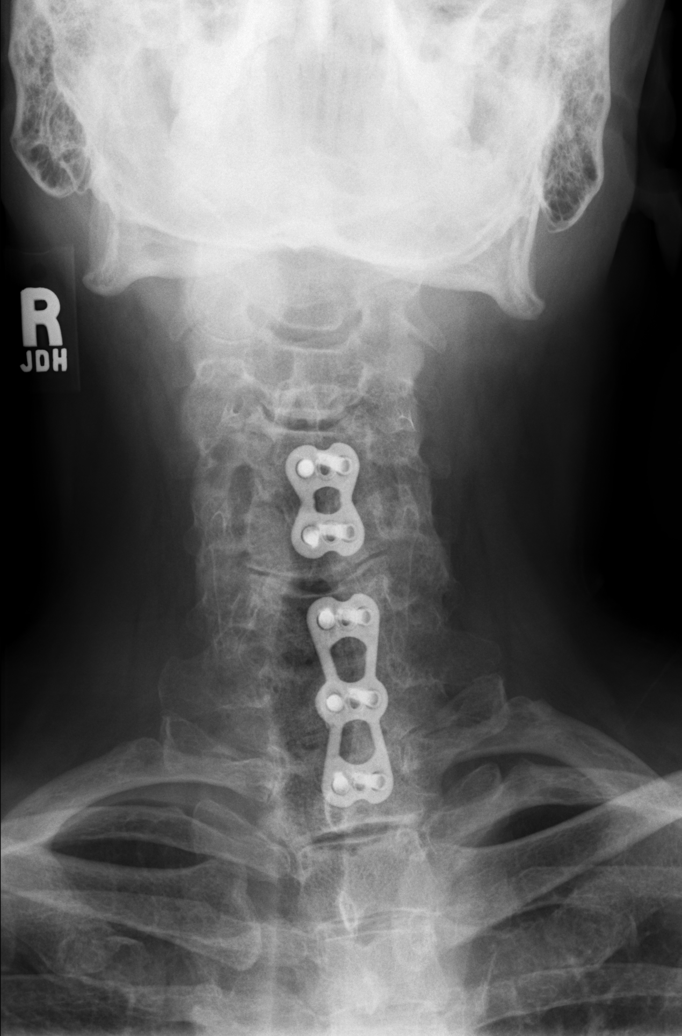

[t cervical spine odontoid]
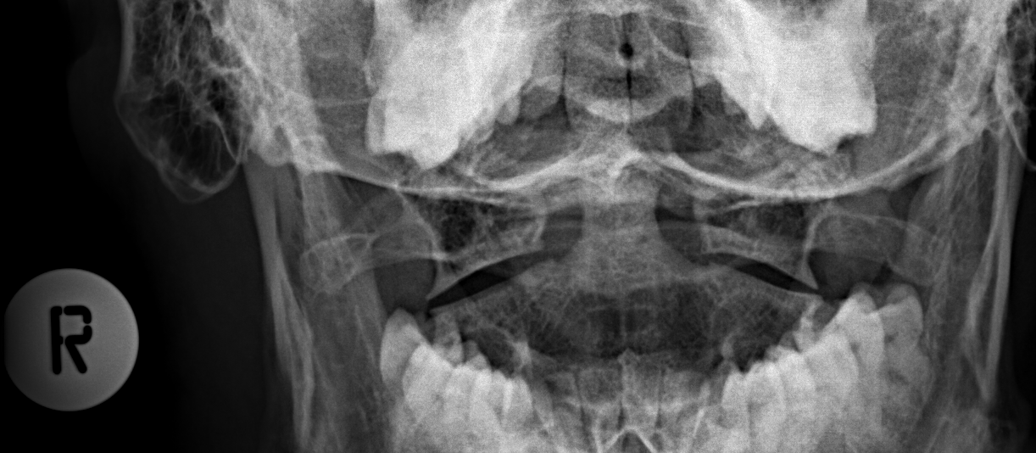

[8 of 8 positions shown; findings below may reference images not displayed]

FINDINGS: Prior C4-C5, and C6 through T1 anterior fusion with good anatomic
alignment. Hardware intact. Diffuse degenerative change. No flexion
or extension deformity. Apical pleural thickening consistent with
scarring.
IMPRESSION: 1. No acute abnormality.  No flexion or extension deformity.
2. Prior C4-C5 and C6 through T1 cervical spine fusion. Good
anatomic alignment. Hardware intact. Diffuse degenerative change
present.

## 2016-09-30 ENCOUNTER — Encounter: Payer: BLUE CROSS/BLUE SHIELD | Attending: Physical Medicine & Rehabilitation | Admitting: Registered Nurse

## 2016-09-30 ENCOUNTER — Encounter: Payer: Self-pay | Admitting: Registered Nurse

## 2016-09-30 VITALS — BP 113/72 | HR 94 | Resp 14

## 2016-09-30 DIAGNOSIS — M47817 Spondylosis without myelopathy or radiculopathy, lumbosacral region: Secondary | ICD-10-CM | POA: Diagnosis not present

## 2016-09-30 DIAGNOSIS — M79669 Pain in unspecified lower leg: Secondary | ICD-10-CM | POA: Insufficient documentation

## 2016-09-30 DIAGNOSIS — M5134 Other intervertebral disc degeneration, thoracic region: Secondary | ICD-10-CM | POA: Diagnosis not present

## 2016-09-30 DIAGNOSIS — M5412 Radiculopathy, cervical region: Secondary | ICD-10-CM | POA: Diagnosis not present

## 2016-09-30 DIAGNOSIS — E119 Type 2 diabetes mellitus without complications: Secondary | ICD-10-CM | POA: Insufficient documentation

## 2016-09-30 DIAGNOSIS — M961 Postlaminectomy syndrome, not elsewhere classified: Secondary | ICD-10-CM | POA: Diagnosis not present

## 2016-09-30 DIAGNOSIS — M5489 Other dorsalgia: Secondary | ICD-10-CM | POA: Insufficient documentation

## 2016-09-30 DIAGNOSIS — M47896 Other spondylosis, lumbar region: Secondary | ICD-10-CM | POA: Diagnosis not present

## 2016-09-30 DIAGNOSIS — G8929 Other chronic pain: Secondary | ICD-10-CM | POA: Diagnosis not present

## 2016-09-30 DIAGNOSIS — M5414 Radiculopathy, thoracic region: Secondary | ICD-10-CM | POA: Insufficient documentation

## 2016-09-30 DIAGNOSIS — M791 Myalgia: Secondary | ICD-10-CM | POA: Diagnosis not present

## 2016-09-30 DIAGNOSIS — Z79899 Other long term (current) drug therapy: Secondary | ICD-10-CM

## 2016-09-30 DIAGNOSIS — M5135 Other intervertebral disc degeneration, thoracolumbar region: Secondary | ICD-10-CM | POA: Insufficient documentation

## 2016-09-30 DIAGNOSIS — Z5181 Encounter for therapeutic drug level monitoring: Secondary | ICD-10-CM

## 2016-09-30 DIAGNOSIS — Z760 Encounter for issue of repeat prescription: Secondary | ICD-10-CM | POA: Insufficient documentation

## 2016-09-30 DIAGNOSIS — M7918 Myalgia, other site: Secondary | ICD-10-CM

## 2016-09-30 DIAGNOSIS — M179 Osteoarthritis of knee, unspecified: Secondary | ICD-10-CM | POA: Insufficient documentation

## 2016-09-30 DIAGNOSIS — M5430 Sciatica, unspecified side: Secondary | ICD-10-CM | POA: Diagnosis not present

## 2016-09-30 DIAGNOSIS — G894 Chronic pain syndrome: Secondary | ICD-10-CM

## 2016-09-30 MED ORDER — OXYCODONE HCL ER 20 MG PO T12A: 20.0000 mg | EXTENDED_RELEASE_TABLET | Freq: Two times a day (BID) | ORAL | 0 refills | Status: DC

## 2016-09-30 MED ORDER — OXYCODONE-ACETAMINOPHEN 5-325 MG PO TABS: 1.0000 | ORAL_TABLET | Freq: Two times a day (BID) | ORAL | 0 refills | Status: DC

## 2016-09-30 NOTE — Progress Notes (Signed)
Subjective:    Patient ID: David [name], male    DOB: Feb 22, 1976, 40 y.o.   MRN: [id number]  HPI: Mr. David LeedsJohn O [name] is a 40 year old male who returns for follow up for chronic pain and medication refill.He states his pain is located in his neck and mid-lower back radiating into his right hip and lower extremity laterally. Also states his pain has increased intensity was asking about MRI, will speak with Dr. Wynn BankerKirsteins and give him a call, he verbalizes understanding. He rates his pain 5. His current exercise regime is walking and performing stretching exercises.  [facility] he will be switched to Oxycontin per Dr. Wynn BankerKirsteins recommendations.    Pain Inventory Average Pain 5 Pain Right Now 5 My pain is intermittent, constant, sharp, burning, dull, stabbing, tingling and aching  In the last 24 hours, has pain interfered with the following? General activity 5 Relation with others 5 Enjoyment of life 6 What TIME of day is your pain at its worst? All Sleep (in general) Fair  Pain is worse with: walking, bending, sitting, inactivity, standing, unsure and some activites Pain improves with: rest, heat/ice, therapy/exercise, pacing activities and medication Relief from Meds: 5  Mobility walk without assistance how many minutes can you walk? 60 ability to climb steps?  yes do you drive?  yes  Function not employed: date last employed 06/2015  Neuro/Psych numbness tingling spasms depression anxiety  Prior Studies Any changes since last visit?  no  Physicians involved in your care Any changes since last visit?  no   Family History  Problem Relation Age of Onset  . Arthritis Mother   . Heart disease Mother   . Diabetes Mother   . Heart disease Father   . Diabetes Father    Social History   Social History  . Marital status: Married    Spouse name: N/A  . Number of children: N/A  . Years of education: N/A   Social History Main Topics    . Smoking status: Never Smoker  . Smokeless tobacco: Never Used  . Alcohol use No  . Drug use: No  . Sexual activity: Not Asked   Other Topics Concern  . None   Social History Narrative  . None   No past surgical history on file. Past Medical History:  Diagnosis Date  . Chronic pain syndrome   . 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 Lumbago   . Pain in joint, lower leg   . Postlaminectomy syndrome, cervical region   . Primary localized osteoarthrosis, lower leg   . Sciatica   . Thoracic radiculopathy    BP 113/72   Pulse 94   Resp 14   SpO2 98%   Opioid Risk Score:   Fall Risk Score:  `1  Depression screen PHQ 2/9  Dep[facility] 2/9 01/02/2016 06/13/2015 01/11/2015  Decreased Interest 1 0 0  Down, Depressed, Hopeless 1 1 1   PHQ - 2 Score 2 1 1   Altered sleeping 2 - 1  Tired, decreased energy 2 - 1  Change in appetite 0 - 0  Feeling bad or failure about yourself  0 - 0  Trouble concentrating 0 - 0  Moving slowly or fidgety/restless 0 - 0  Suicidal thoughts 0 - 0  PHQ-9 Score 6 - 3  Difficult doing work/chores Somewhat difficult - -      Review of Systems  Constitutional: Negative.   HENT:  Negative.   Eyes: Negative.   Respiratory: Negative.   Cardiovascular: Negative.   Gastrointestinal: Negative.   Endocrine: Negative.   Genitourinary: Negative.   Musculoskeletal: Negative.   Skin: Negative.   Allergic/Immunologic: Negative.   Neurological: Negative.   Hematological: Negative.   Psychiatric/Behavioral: Negative.   All other systems reviewed and are negative.      Objective:   Physical Exam  Constitutional: He is oriented to person, place, and time. He appears well-developed and well-nourished.  HENT:  Head: Normocephalic and atraumatic.  Neck: Normal range of motion. Neck supple.  Musculoskeletal:  Normal Muscle Bulk and Muscle Testing Reveals: Upper Extremities: Full ROM and Muscle Strength 5/5 Thoracic  Paraspinal Tenderness: T-10-12 Lumbar Hypersensitivity Lower Extremities: Full ROM and Muscle Strength 5/5 Arises from Chair with ease Narrow Based Gait  Neurological: He is alert and oriented to person, place, and time.  Skin: Skin is warm and dry.  Psychiatric: He has a normal mood and affect.  Nursing note and vitals reviewed.         Assessment & Plan:  1. Cervical postlaminectomy syndrome, Hx of ACDF C4-5, C6-7, C7-T1, 02/2008. Continue current medication and alternate using heat and ice therapy.  2. Spondylosis of L-spine . Continue current treatment regime.  3. Chronic postoperative pain:  RX: Oxycontin 20 mg one tablet every 12 hours #60 and Refilled Oxycodone 5/325mg  one tablet twice a day #60.  We will continue the opioid monitoring program, this consists of regular clinic visits, examinations, urine drug screen, pill counts as well as use of [place] Cont[place] Sub[place]. 4. 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 Continue Gabapentin, Keep blood sugars under tight control. Continue to monitor.  5. Thoracic Radiculopathy:Continue Gabapentin.  20 minutes of face to face patient care time was spent. All questions were encouraged and answered. F/U in 1 month

## 2016-10-02 ENCOUNTER — Telehealth: Payer: Self-pay | Admitting: *Deleted

## 2016-10-02 DIAGNOSIS — M5416 Radiculopathy, lumbar region: Secondary | ICD-10-CM

## 2016-10-02 NOTE — Telephone Encounter (Signed)
Left message for David RuizJohn to let us know where he prefers to have his MRI performed.

## 2016-10-05 ENCOUNTER — Telehealth: Payer: Self-pay | Admitting: Physical Medicine & Rehabilitation

## 2016-10-05 LAB — TOXASSURE SELECT,+ANTIDEPR,UR

## 2016-10-05 NOTE — Telephone Encounter (Signed)
He needs an open MRI.

## 2016-10-05 NOTE — Telephone Encounter (Signed)
[employer] has [place] a call back [place] about the MRI that has been ordered for patient.  Please call them at [phone]. They will need more information from you.

## 2016-10-05 NOTE — Telephone Encounter (Signed)
[name] requested an Open MRI, [facility]. Ordered placed. [name] is aware of the above and verbalizes understanding.

## 2016-10-06 NOTE — Telephone Encounter (Signed)
[facility] Lum[facility]:  They have denied David [name] Lumbar MRI [facility] " he needs to have 6 weeks of physical therapy, after the completion of physical therapy he needs to be re- evaluated in our office. If the symptoms persists we can place a new order for MRI at that time. A call was placed to David [name] no answer, left message to call office.

## 2016-10-14 ENCOUNTER — Telehealth: Payer: Self-pay | Admitting: *Deleted

## 2016-10-14 NOTE — Telephone Encounter (Signed)
Return David [name] call,  Regarding [employer] decision.  He will decide about physical therapy, he's in the process of obtaining new insurance. He's tolerating the Oxycontin at this time, states it makes him sleepy.  He verbalizes understanding.

## 2016-10-14 NOTE — Progress Notes (Signed)
Urine drug screen for this encounter is consistent for prescribed medication 

## 2016-10-14 NOTE — Telephone Encounter (Signed)
Mr David [name] is needing to speak with David [name] about his MRI

## 2016-10-18 ENCOUNTER — Other Ambulatory Visit: Payer: BLUE CROSS/BLUE SHIELD

## 2016-10-30 ENCOUNTER — Encounter (INDEPENDENT_AMBULATORY_CARE_PROVIDER_SITE_OTHER): Payer: Self-pay

## 2016-10-30 ENCOUNTER — Encounter: Payer: Self-pay | Admitting: Registered Nurse

## 2016-10-30 ENCOUNTER — Encounter: Payer: BLUE CROSS/BLUE SHIELD | Attending: Physical Medicine & Rehabilitation | Admitting: Registered Nurse

## 2016-10-30 VITALS — BP 114/71 | HR 88

## 2016-10-30 DIAGNOSIS — M791 Myalgia: Secondary | ICD-10-CM

## 2016-10-30 DIAGNOSIS — M5134 Other intervertebral disc degeneration, thoracic region: Secondary | ICD-10-CM | POA: Diagnosis not present

## 2016-10-30 DIAGNOSIS — M5416 Radiculopathy, lumbar region: Secondary | ICD-10-CM

## 2016-10-30 DIAGNOSIS — Z79899 Other long term (current) drug therapy: Secondary | ICD-10-CM

## 2016-10-30 DIAGNOSIS — M5489 Other dorsalgia: Secondary | ICD-10-CM | POA: Diagnosis not present

## 2016-10-30 DIAGNOSIS — G894 Chronic pain syndrome: Secondary | ICD-10-CM

## 2016-10-30 DIAGNOSIS — M961 Postlaminectomy syndrome, not elsewhere classified: Secondary | ICD-10-CM | POA: Insufficient documentation

## 2016-10-30 DIAGNOSIS — M179 Osteoarthritis of knee, unspecified: Secondary | ICD-10-CM | POA: Insufficient documentation

## 2016-10-30 DIAGNOSIS — M7918 Myalgia, other site: Secondary | ICD-10-CM

## 2016-10-30 DIAGNOSIS — M47896 Other spondylosis, lumbar region: Secondary | ICD-10-CM | POA: Insufficient documentation

## 2016-10-30 DIAGNOSIS — G8929 Other chronic pain: Secondary | ICD-10-CM | POA: Insufficient documentation

## 2016-10-30 DIAGNOSIS — M47817 Spondylosis without myelopathy or radiculopathy, lumbosacral region: Secondary | ICD-10-CM

## 2016-10-30 DIAGNOSIS — Z760 Encounter for issue of repeat prescription: Secondary | ICD-10-CM | POA: Diagnosis not present

## 2016-10-30 DIAGNOSIS — M5412 Radiculopathy, cervical region: Secondary | ICD-10-CM

## 2016-10-30 DIAGNOSIS — Z5181 Encounter for therapeutic drug level monitoring: Secondary | ICD-10-CM

## 2016-10-30 DIAGNOSIS — M5135 Other intervertebral disc degeneration, thoracolumbar region: Secondary | ICD-10-CM | POA: Insufficient documentation

## 2016-10-30 DIAGNOSIS — E119 Type 2 diabetes mellitus without complications: Secondary | ICD-10-CM | POA: Insufficient documentation

## 2016-10-30 DIAGNOSIS — M5414 Radiculopathy, thoracic region: Secondary | ICD-10-CM

## 2016-10-30 DIAGNOSIS — M79669 Pain in unspecified lower leg: Secondary | ICD-10-CM | POA: Insufficient documentation

## 2016-10-30 DIAGNOSIS — M5430 Sciatica, unspecified side: Secondary | ICD-10-CM | POA: Diagnosis not present

## 2016-10-30 MED ORDER — OXYCODONE-ACETAMINOPHEN 5-325 MG PO TABS: 1.0000 | ORAL_TABLET | Freq: Two times a day (BID) | ORAL | 0 refills | Status: DC

## 2016-10-30 MED ORDER — OXYCODONE HCL ER 20 MG PO T12A: 20.0000 mg | EXTENDED_RELEASE_TABLET | Freq: Two times a day (BID) | ORAL | 0 refills | Status: DC

## 2016-10-30 NOTE — Progress Notes (Signed)
Subjective:    Patient ID: David LeedsJohn O [name], male    DOB: 12/27/1975, 40 y.o.   MRN: [id number]  HPI: [name] is a 40 year old male who returns for follow up for chronic pain and medication refill.He states his pain is located in his neck radiating into bilateral shoulders, mid-lower back radiating into his right hip and lower extremities laterally. He rates his pain 6.. His current exercise regime is walking and performing stretching exercises.    Pain Inventory Average Pain 5 Pain Right Now 6 My pain is constant, sharp, burning, dull, stabbing, tingling and aching  In the last 24 hours, has pain interfered with the following? General activity 6 Relation with others 5 Enjoyment of life 6 What TIME of day is your pain at its worst? all Sleep (in general) Fair  Pain is worse with: walking, bending, sitting, inactivity, standing and some activites Pain improves with: rest, heat/ice, pacing activities and medication Relief from Meds: 5  Mobility walk without assistance ability to climb steps?  yes do you drive?  yes  Function not employed: date last employed 2016  Neuro/Psych numbness spasms depression anxiety  Prior Studies Any changes since last visit?  no  Physicians involved in your care Any changes since last visit?  no   Family History  Problem Relation Age of Onset  . Arthritis Mother   . Heart disease Mother   . Diabetes Mother   . Heart disease Father   . Diabetes Father    Social History   Social History  . Marital status: Married    Spouse name: N/A  . Number of children: N/A  . Years of education: N/A   Social History Main Topics  . Smoking status: Never Smoker  . Smokeless tobacco: Never Used  . Alcohol use No  . Drug use: No  . Sexual activity: Not on file   Other Topics Concern  . Not on file   Social History Narrative  . No narrative on file   No past surgical history on file. Past Medical History:  Diagnosis Date  .  Chronic pain syndrome   . 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 Lumbago   . Pain in joint, lower leg   . Postlaminectomy syndrome, cervical region   . Primary localized osteoarthrosis, lower leg   . Sciatica   . Thoracic radiculopathy    There were no vitals taken for this visit.  Opioid Risk Score:   Fall Risk Score:  `1  Depression screen PHQ 2/9  Dep[facility] screen [facility] 01/02/2016 06/13/2015 01/11/2015  Decreased Interest 1 0 0  Down, Depressed, Hopeless 1 1 1   PHQ - 2 Score 2 1 1   Altered sleeping 2 - 1  Tired, decreased energy 2 - 1  Change in appetite 0 - 0  Feeling bad or failure about yourself  0 - 0  Trouble concentrating 0 - 0  Moving slowly or fidgety/restless 0 - 0  Suicidal thoughts 0 - 0  PHQ-9 Score 6 - 3  Difficult doing work/chores Somewhat difficult - -   Review of Systems  Constitutional: Negative.   HENT: Negative.   Eyes: Negative.   Respiratory: Negative.   Cardiovascular: Negative.   Gastrointestinal: Negative.   Endocrine: Negative.   Genitourinary: Negative.   Musculoskeletal: Positive for back pain.       Spasms  Skin: Negative.   Allergic/Immunologic: Negative.   Neurological: Positive for numbness.  Hematological: Negative.  Psychiatric/Behavioral: Positive for dysphoric mood. The patient is nervous/anxious.   All other systems reviewed and are negative.      Objective:   Physical Exam  Constitutional: He is oriented to person, place, and time. He appears well-developed and well-nourished.  HENT:  Head: Normocephalic and atraumatic.  Neck: Normal range of motion. Neck supple.  Cardiovascular: Normal rate and regular rhythm.   Pulmonary/Chest: Effort normal and breath sounds normal.  Musculoskeletal:  Normal Muscle Bulk and Muscle Testing Reveals: Full ROM and Muscle Strength 5/5 Thoracic Paraspinal Tenderness: T-7-T-9 T-11- T-12 Lumbar Paraspinal Tenderness: L-3-L-5 Lower Extremities: Full ROM and  Muscle Strength 5/5 Arises from Table with Ease Narrow Based Gait   Neurological: He is alert and oriented to person, place, and time.  Skin: Skin is warm and dry.  Psychiatric: He has a normal mood and affect.  Nursing note and vitals reviewed.         Assessment & Plan:  1. Cervical postlaminectomy syndrome, Hx of ACDF C4-5, C6-7, C7-T1, 02/2008. Continue current medication and alternate using heat and ice therapy.  2. Spondylosis of L-spine . Continue current treatment regime.  3. Chronic postoperative pain:  Refilled: Oxycontin 20 mg one tablet every 12 hours #60 and Refilled Oxycodone 5/325mg  one tablet twice a day #60.  We will continue the opioid monitoring program, this consists of regular clinic visits, examinations, urine drug screen, pill counts as well as use of [place] Controlled Substance Reporting System. 4. 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 Continue Gabapentin, Keep blood sugars under tight control. Continue to monitor.  5. Thoracic Radiculopathy:Continue Gabapentin.  20 minutes of face to face patient care time was spent. All questions were encouraged and answered. F/U in 1 month

## 2016-11-26 ENCOUNTER — Ambulatory Visit: Payer: BLUE CROSS/BLUE SHIELD | Admitting: Physical Medicine & Rehabilitation

## 2016-12-01 ENCOUNTER — Telehealth: Payer: Self-pay | Admitting: *Deleted

## 2016-12-01 ENCOUNTER — Encounter: Payer: Self-pay | Admitting: Physical Medicine & Rehabilitation

## 2016-12-01 ENCOUNTER — Encounter: Payer: BLUE CROSS/BLUE SHIELD | Attending: Physical Medicine & Rehabilitation

## 2016-12-01 ENCOUNTER — Ambulatory Visit (HOSPITAL_BASED_OUTPATIENT_CLINIC_OR_DEPARTMENT_OTHER): Payer: BLUE CROSS/BLUE SHIELD | Admitting: Physical Medicine & Rehabilitation

## 2016-12-01 VITALS — BP 117/75 | HR 100 | Resp 16

## 2016-12-01 DIAGNOSIS — E1142 Type 2 diabetes mellitus with diabetic polyneuropathy: Secondary | ICD-10-CM | POA: Diagnosis not present

## 2016-12-01 DIAGNOSIS — M5134 Other intervertebral disc degeneration, thoracic region: Secondary | ICD-10-CM | POA: Diagnosis not present

## 2016-12-01 DIAGNOSIS — M79669 Pain in unspecified lower leg: Secondary | ICD-10-CM | POA: Diagnosis not present

## 2016-12-01 DIAGNOSIS — M5489 Other dorsalgia: Secondary | ICD-10-CM | POA: Diagnosis not present

## 2016-12-01 DIAGNOSIS — E119 Type 2 diabetes mellitus without complications: Secondary | ICD-10-CM | POA: Insufficient documentation

## 2016-12-01 DIAGNOSIS — G8929 Other chronic pain: Secondary | ICD-10-CM

## 2016-12-01 DIAGNOSIS — M25551 Pain in right hip: Secondary | ICD-10-CM | POA: Diagnosis not present

## 2016-12-01 DIAGNOSIS — M47896 Other spondylosis, lumbar region: Secondary | ICD-10-CM | POA: Diagnosis not present

## 2016-12-01 DIAGNOSIS — M5135 Other intervertebral disc degeneration, thoracolumbar region: Secondary | ICD-10-CM | POA: Diagnosis not present

## 2016-12-01 DIAGNOSIS — M546 Pain in thoracic spine: Secondary | ICD-10-CM | POA: Diagnosis not present

## 2016-12-01 DIAGNOSIS — M961 Postlaminectomy syndrome, not elsewhere classified: Secondary | ICD-10-CM | POA: Diagnosis not present

## 2016-12-01 DIAGNOSIS — Z760 Encounter for issue of repeat prescription: Secondary | ICD-10-CM | POA: Insufficient documentation

## 2016-12-01 DIAGNOSIS — M179 Osteoarthritis of knee, unspecified: Secondary | ICD-10-CM | POA: Diagnosis not present

## 2016-12-01 DIAGNOSIS — M5414 Radiculopathy, thoracic region: Secondary | ICD-10-CM | POA: Insufficient documentation

## 2016-12-01 DIAGNOSIS — M5430 Sciatica, unspecified side: Secondary | ICD-10-CM | POA: Insufficient documentation

## 2016-12-01 MED ORDER — OXYCODONE-ACETAMINOPHEN 5-325 MG PO TABS: 1.0000 | ORAL_TABLET | Freq: Two times a day (BID) | ORAL | 0 refills | Status: DC

## 2016-12-01 MED ORDER — OXYCODONE HCL ER 20 MG PO T12A: 20.0000 mg | EXTENDED_RELEASE_TABLET | Freq: Two times a day (BID) | ORAL | 0 refills | Status: DC

## 2016-12-01 NOTE — Patient Instructions (Signed)
Will review x-rays next visit if there is anything more urgent will call you

## 2016-12-01 NOTE — Telephone Encounter (Signed)
error 

## 2016-12-01 NOTE — Progress Notes (Signed)
Subjective:    Patient ID: David [name], male    DOB: 1976/02/26, 41 y.o.   MRN: [id number]  HPI  41 yo male with cervical post lami syndrome, thoracic spondylosis On chronic opioids, low-moderate dose For the last few months has had increased pain in interscapular area with pain shooting down both legs, this happens when he lays on RIght side Pain goes down to feet , this occurs at night Also has Right hip pain which is increasing  No falls or trauma Patient is also diabetic and has been so for 6 or 7 years. Patient is on gabapentin 400 mg 4 times a day  No bowel or bladder dysfunction Pain Inventory Average Pain 5 Pain Right Now 5 My pain is intermittent, constant, sharp, burning, dull, stabbing, tingling and aching  In the last 24 hours, has pain interfered with the following? General activity 6 Relation with others 5 Enjoyment of life 5 What TIME of day is your pain at its worst? morning, daytime, evening, night Sleep (in general) Fair  Pain is worse with: walking, bending, sitting, inactivity, standing and some activites Pain improves with: rest, heat/ice, therapy/exercise, pacing activities and medication Relief from Meds: 5  Mobility walk without assistance how many minutes can you walk? 60 ability to climb steps?  yes do you drive?  yes  Function not employed: date last employed .  Neuro/Psych numbness tingling spasms depression anxiety  Prior Studies Any changes since last visit?  no  Physicians involved in your care Any changes since last visit?  no   Family History  Problem Relation Age of Onset  . Arthritis Mother   . Heart disease Mother   . Diabetes Mother   . Heart disease Father   . Diabetes Father    Social History   Social History  . Marital status: Married    Spouse name: N/A  . Number of children: N/A  . Years of education: N/A   Social History Main Topics  . Smoking status: Never Smoker  . Smokeless tobacco: Never Used    . Alcohol use No  . Drug use: No  . Sexual activity: Not Asked   Other Topics Concern  . None   Social History Narrative  . None   History reviewed. No pertinent surgical history. Past Medical History:  Diagnosis Date  . Chronic pain syndrome   . 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 Lumbago   . Pain in joint, lower leg   . Postlaminectomy syndrome, cervical region   . Primary localized osteoarthrosis, lower leg   . Sciatica   . Thoracic radiculopathy    BP 117/75   Pulse 100   Resp 16   SpO2 98%   Opioid Risk Score:   Fall Risk Score:  `1  Depression screen PHQ 2/9  Depression screen [facility] 2/9 01/02/2016 06/13/2015 01/11/2015  Decreased Interest 1 0 0  Down, Depressed, Hopeless 1 1 1   PHQ - 2 Score 2 1 1   Altered sleeping 2 - 1  Tired, decreased energy 2 - 1  Change in appetite 0 - 0  Feeling bad or failure about yourself  0 - 0  Trouble concentrating 0 - 0  Moving slowly or fidgety/restless 0 - 0  Suicidal thoughts 0 - 0  PHQ-9 Score 6 - 3  Difficult doing work/chores Somewhat difficult - -    Review of Systems  Constitutional: Negative.   HENT: Negative.   Eyes: Negative.  Respiratory: Negative.   Cardiovascular: Negative.   Gastrointestinal: Negative.   Genitourinary: Negative.   Musculoskeletal: Positive for back pain, myalgias, neck pain and neck stiffness.       Spasms  Skin: Negative.   Allergic/Immunologic: Negative.   Neurological: Positive for numbness.       Tingling   Hematological: Negative.   Psychiatric/Behavioral: Positive for dysphoric mood. The patient is nervous/anxious.   All other systems reviewed and are negative.      Objective:   Physical Exam  Constitutional: He is oriented to person, place, and time. He appears well-developed and well-nourished.  HENT:  Head: Normocephalic and atraumatic.  Eyes: Conjunctivae and EOM are normal. Pupils are equal, round, and reactive to light.  Neck: Normal  range of motion.  Musculoskeletal:       Right hip: He exhibits normal range of motion, normal strength, no tenderness and no deformity.       Thoracic back: He exhibits decreased range of motion and tenderness. He exhibits no deformity and no spasm.       Lumbar back: He exhibits decreased range of motion. He exhibits no tenderness and no deformity.  Neurological: He is alert and oriented to person, place, and time. A sensory deficit is present. He exhibits normal muscle tone. Gait normal.  Reflex Scores:      Patellar reflexes are 0 on the right side and 0 on the left side.      Achilles reflexes are 0 on the right side and 0 on the left side. Sensation reduced both feet in a stocking fashion up to the lower calf level Mild foot intrinsic atrophy bilaterally. No evidence of skin lesions  Psychiatric: He has a normal mood and affect. His behavior is normal. Judgment and thought content normal.  Nursing note and vitals reviewed. Right hip. No pain with internal/external rotation. No pain with hyperflexion. No evidence of deformity. No pain with palpation over the lateral aspect    Assessment & Plan:  1. Cervical postlaminectomy syndrome with chronic neck pain. No evidence of cervical radiculopathy. 2. Mid back pain appears lower thoracic. No clearcut neurologic signs. No neurogenic bowel or bladder. His lower extremity sensory loss is in a stocking pattern rather than in a dermatomal pattern  3. Diabetes with polyneuropathy stocking distribution sensory loss in both feet, and this accounts for the sensory symptoms. We discussed increasing gabapentin he'd like to hold off for now  4. Chronic pain syndrome, multifactorial as above. Continue OxyContin 20 mg twice a day, continue Percocet 5 mg twice a day Continue opioid monitoring program. This consists of regular clinic visits, examinations, urine drug screen, pill counts as well as use of [place] controlled substance reporting  System. Last UDS 09/30/2016 was consistent  5. Right hip pain appears to be in the joint rather than bursal. Exam is unremarkable. We'll check x-ray

## 2016-12-17 ENCOUNTER — Ambulatory Visit
Admission: RE | Admit: 2016-12-17 | Discharge: 2016-12-17 | Disposition: A | Discharge status: Home / Self Care | Payer: BLUE CROSS/BLUE SHIELD | Source: Ambulatory Visit | Attending: Physical Medicine & Rehabilitation | Admitting: Physical Medicine & Rehabilitation

## 2016-12-17 ENCOUNTER — Other Ambulatory Visit: Payer: Self-pay | Admitting: Physical Medicine & Rehabilitation

## 2016-12-17 DIAGNOSIS — M25551 Pain in right hip: Secondary | ICD-10-CM

## 2016-12-17 DIAGNOSIS — G8929 Other chronic pain: Secondary | ICD-10-CM

## 2016-12-17 DIAGNOSIS — M546 Pain in thoracic spine: Secondary | ICD-10-CM

## 2016-12-29 ENCOUNTER — Encounter: Payer: Self-pay | Admitting: Physical Medicine & Rehabilitation

## 2016-12-29 ENCOUNTER — Encounter: Payer: BLUE CROSS/BLUE SHIELD | Attending: Physical Medicine & Rehabilitation

## 2016-12-29 ENCOUNTER — Ambulatory Visit (HOSPITAL_BASED_OUTPATIENT_CLINIC_OR_DEPARTMENT_OTHER): Payer: BLUE CROSS/BLUE SHIELD | Admitting: Physical Medicine & Rehabilitation

## 2016-12-29 VITALS — BP 113/77 | HR 86 | Resp 14

## 2016-12-29 DIAGNOSIS — M961 Postlaminectomy syndrome, not elsewhere classified: Secondary | ICD-10-CM | POA: Diagnosis not present

## 2016-12-29 DIAGNOSIS — M5414 Radiculopathy, thoracic region: Secondary | ICD-10-CM | POA: Diagnosis not present

## 2016-12-29 DIAGNOSIS — M79669 Pain in unspecified lower leg: Secondary | ICD-10-CM | POA: Insufficient documentation

## 2016-12-29 DIAGNOSIS — G894 Chronic pain syndrome: Secondary | ICD-10-CM

## 2016-12-29 DIAGNOSIS — M5430 Sciatica, unspecified side: Secondary | ICD-10-CM | POA: Insufficient documentation

## 2016-12-29 DIAGNOSIS — G8929 Other chronic pain: Secondary | ICD-10-CM | POA: Diagnosis not present

## 2016-12-29 DIAGNOSIS — M5489 Other dorsalgia: Secondary | ICD-10-CM | POA: Insufficient documentation

## 2016-12-29 DIAGNOSIS — E119 Type 2 diabetes mellitus without complications: Secondary | ICD-10-CM | POA: Diagnosis not present

## 2016-12-29 DIAGNOSIS — M5135 Other intervertebral disc degeneration, thoracolumbar region: Secondary | ICD-10-CM | POA: Diagnosis not present

## 2016-12-29 DIAGNOSIS — M5134 Other intervertebral disc degeneration, thoracic region: Secondary | ICD-10-CM | POA: Insufficient documentation

## 2016-12-29 DIAGNOSIS — M47896 Other spondylosis, lumbar region: Secondary | ICD-10-CM | POA: Diagnosis not present

## 2016-12-29 DIAGNOSIS — M546 Pain in thoracic spine: Secondary | ICD-10-CM

## 2016-12-29 DIAGNOSIS — M179 Osteoarthritis of knee, unspecified: Secondary | ICD-10-CM | POA: Insufficient documentation

## 2016-12-29 DIAGNOSIS — Z760 Encounter for issue of repeat prescription: Secondary | ICD-10-CM | POA: Diagnosis present

## 2016-12-29 MED ORDER — OXYCODONE HCL ER 20 MG PO T12A: 20.0000 mg | EXTENDED_RELEASE_TABLET | Freq: Two times a day (BID) | ORAL | 0 refills | Status: DC

## 2016-12-29 MED ORDER — OXYCODONE-ACETAMINOPHEN 5-325 MG PO TABS: 1.0000 | ORAL_TABLET | Freq: Two times a day (BID) | ORAL | 0 refills | Status: DC

## 2016-12-29 MED ORDER — GABAPENTIN 100 MG PO CAPS: ORAL_CAPSULE | ORAL | 0 refills | Status: DC

## 2016-12-29 NOTE — Patient Instructions (Signed)
Please do superman exercises. Hold the position for about 5 seconds and then relax for about half a minute and then do it again for a total of 5 or 10 repetitions

## 2016-12-29 NOTE — Progress Notes (Signed)
Subjective:    Patient ID: David [name], male    DOB: [birth year], 41 y.o.   MRN: [id number]  HPI Right hip pain Xrays neg still has clicking, Pain has not increased. However, over several years time. Still has back pain, both mid back and low back. Has occasional instance of pain shooting down the left leg when leaning toward the left side No bowel or bladder dysfunction No lower extremity weakness  Patient denies any constipation due to narcotics  Pain Inventory Average Pain 5 Pain Right Now 5 My pain is intermittent, constant, sharp, burning, dull, stabbing, tingling and aching  In the last 24 hours, has pain interfered with the following? General activity 5 Relation with others 5 Enjoyment of life 6 What TIME of day is your pain at its worst? all Sleep (in general) Fair  Pain is worse with: walking, bending, sitting, inactivity, standing and some activites Pain improves with: rest, heat/ice, pacing activities and medication Relief from Meds: 5  Mobility walk without assistance how many minutes can you walk? 60 ability to climb steps?  yes do you drive?  yes  Function not employed: date last employed 06/2015  Neuro/Psych numbness tingling spasms depression anxiety  Prior Studies Any changes since last visit?  no  Physicians involved in your care Any changes since last visit?  no   Family History  Problem Relation Age of Onset  . Arthritis Mother   . Heart disease Mother   . Diabetes Mother   . Heart disease Father   . Diabetes Father    Social History   Social History  . Marital status: Married    Spouse name: N/A  . Number of children: N/A  . Years of education: N/A   Social History Main Topics  . Smoking status: Never Smoker  . Smokeless tobacco: Never Used  . Alcohol use No  . Drug use: No  . Sexual activity: Not Asked   Other Topics Concern  . None   Social History Narrative  . None   No past surgical history on file. Past  Medical History:  Diagnosis Date  . Chronic pain syndrome   . 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 Lumbago   . Pain in joint, lower leg   . Postlaminectomy syndrome, cervical region   . Primary localized osteoarthrosis, lower leg   . Sciatica   . Thoracic radiculopathy    BP 113/77   Pulse 86   Resp 14   SpO2 96%   Opioid Risk Score:   Fall Risk Score:  `1  Depression screen PHQ 2/9  Depression screen [facility] 2/9 12/29/2016 01/02/2016 06/13/2015 01/11/2015  Decreased Interest 1 1 0 0  Down, Depressed, Hopeless _0 PHQ - 2 Score _1 Altered sleeping - 2 - 1  Tired, decreased energy - 2 - 1  Change in appetite - 0 - 0  Feeling bad or failure about yourself  - 0 - 0  Trouble concentrating - 0 - 0  Moving slowly or fidgety/restless - 0 - 0  Suicidal thoughts - 0 - 0  PHQ-9 Score - 6 - 3  Difficult doing work/chores - Somewhat difficult - -    Review of Systems  Constitutional: Negative.   HENT: Negative.   Eyes: Negative.   Respiratory: Negative.   Cardiovascular: Negative.   Gastrointestinal: Negative.   Endocrine: Negative.   Genitourinary: Negative.   Musculoskeletal:  Spasms  Skin: Negative.   Neurological: Positive for numbness.       Tingling  Psychiatric/Behavioral: Positive for dysphoric mood. The patient is nervous/anxious.   All other systems reviewed and are negative.      Objective:   Physical Exam  Constitutional: He is oriented to person, place, and time. He appears well-developed and well-nourished.  HENT:  Head: Normocephalic and atraumatic.  Eyes: Conjunctivae are normal. Pupils are equal, round, and reactive to light.  Neck: Normal range of motion.  Neurological: He is alert and oriented to person, place, and time.  Psychiatric: He has a normal mood and affect. His behavior is normal.  Nursing note and vitals reviewed.  Mild tenderness. Palpation along the thoracic paraspinals around T7-T9 minimal pain  around mid lumbar area. Negative straight leg raising. Sensation reduced to pinprick below the ankle in both feet. Skin shows no evidence of breakdown. There are no lesions on the feet.       Assessment & Plan:  1.  Thoracic spondylosis, no evidence of myelopathy. Lower extremity pain and numbness as below  2.  Diabetes with bilateral foot pain, suspect neuropathy but given spine issues may be radic, would rec EMG/NCV.  Pt will wait until deductible met The [place] control substances reporting system was reviewed. There currently is no evidence of multiple prescribers for opiates and other controlled substances. Patients on chronic opioids will have this reviewed at minimum every 3 months. Continue moderate dose narcotic analgesics OxyContin 20 mg every 12 Oxycodone 5 mg twice a day 

## 2017-01-28 ENCOUNTER — Encounter: Payer: Self-pay | Admitting: Registered Nurse

## 2017-01-28 ENCOUNTER — Encounter: Payer: BLUE CROSS/BLUE SHIELD | Attending: Physical Medicine & Rehabilitation | Admitting: Registered Nurse

## 2017-01-28 VITALS — BP 118/76 | HR 98

## 2017-01-28 DIAGNOSIS — M47896 Other spondylosis, lumbar region: Secondary | ICD-10-CM | POA: Insufficient documentation

## 2017-01-28 DIAGNOSIS — Z5181 Encounter for therapeutic drug level monitoring: Secondary | ICD-10-CM

## 2017-01-28 DIAGNOSIS — M5414 Radiculopathy, thoracic region: Secondary | ICD-10-CM | POA: Insufficient documentation

## 2017-01-28 DIAGNOSIS — M5134 Other intervertebral disc degeneration, thoracic region: Secondary | ICD-10-CM | POA: Diagnosis not present

## 2017-01-28 DIAGNOSIS — G8929 Other chronic pain: Secondary | ICD-10-CM | POA: Diagnosis not present

## 2017-01-28 DIAGNOSIS — M179 Osteoarthritis of knee, unspecified: Secondary | ICD-10-CM | POA: Diagnosis not present

## 2017-01-28 DIAGNOSIS — M47817 Spondylosis without myelopathy or radiculopathy, lumbosacral region: Secondary | ICD-10-CM | POA: Diagnosis not present

## 2017-01-28 DIAGNOSIS — M5489 Other dorsalgia: Secondary | ICD-10-CM | POA: Insufficient documentation

## 2017-01-28 DIAGNOSIS — E119 Type 2 diabetes mellitus without complications: Secondary | ICD-10-CM | POA: Insufficient documentation

## 2017-01-28 DIAGNOSIS — M5430 Sciatica, unspecified side: Secondary | ICD-10-CM | POA: Diagnosis not present

## 2017-01-28 DIAGNOSIS — M5135 Other intervertebral disc degeneration, thoracolumbar region: Secondary | ICD-10-CM | POA: Insufficient documentation

## 2017-01-28 DIAGNOSIS — Z79899 Other long term (current) drug therapy: Secondary | ICD-10-CM | POA: Diagnosis not present

## 2017-01-28 DIAGNOSIS — G894 Chronic pain syndrome: Secondary | ICD-10-CM | POA: Diagnosis not present

## 2017-01-28 DIAGNOSIS — M7918 Myalgia, other site: Secondary | ICD-10-CM

## 2017-01-28 DIAGNOSIS — M961 Postlaminectomy syndrome, not elsewhere classified: Secondary | ICD-10-CM | POA: Insufficient documentation

## 2017-01-28 DIAGNOSIS — M5416 Radiculopathy, lumbar region: Secondary | ICD-10-CM

## 2017-01-28 DIAGNOSIS — M25551 Pain in right hip: Secondary | ICD-10-CM

## 2017-01-28 DIAGNOSIS — M546 Pain in thoracic spine: Secondary | ICD-10-CM | POA: Diagnosis not present

## 2017-01-28 DIAGNOSIS — M79669 Pain in unspecified lower leg: Secondary | ICD-10-CM | POA: Insufficient documentation

## 2017-01-28 DIAGNOSIS — Z760 Encounter for issue of repeat prescription: Secondary | ICD-10-CM | POA: Insufficient documentation

## 2017-01-28 DIAGNOSIS — G609 Hereditary and idiopathic neuropathy, unspecified: Secondary | ICD-10-CM

## 2017-01-28 DIAGNOSIS — M791 Myalgia: Secondary | ICD-10-CM

## 2017-01-28 MED ORDER — OXYCODONE-ACETAMINOPHEN 5-325 MG PO TABS: 1.0000 | ORAL_TABLET | Freq: Two times a day (BID) | ORAL | 0 refills | Status: DC

## 2017-01-28 MED ORDER — OXYCODONE HCL ER 20 MG PO T12A: 20.0000 mg | EXTENDED_RELEASE_TABLET | Freq: Two times a day (BID) | ORAL | 0 refills | Status: DC

## 2017-01-28 MED ORDER — GABAPENTIN 100 MG PO CAPS: ORAL_CAPSULE | ORAL | 2 refills | Status: DC

## 2017-01-28 NOTE — Progress Notes (Signed)
Subjective:    Patient ID: David [name], male    DOB: 12-24-1975, 41 y.o.   MRN: [id number]  HPI:  David [name] is a 41 year old male who returns for follow up appointment for chronic pain and medication refill.He states his pain is located in his neck, mid-lower back radiating into his right hip and  lower extremities with numbness and tingling. Also states he has generalized pain all over. He rates his pain 5. His current exercise regime is walking and performing stretching exercises.  Also states he has been exeperiencing occasionally  tingling by the left tragus at times, denies at this time.   Pain Inventory Average Pain 5 Pain Right Now 5 My pain is intermittent, constant, sharp, burning, dull, stabbing, tingling and aching  In the last 24 hours, has pain interfered with the following? General activity 6 Relation with others 5 Enjoyment of life 6 What TIME of day is your pain at its worst? all Sleep (in general) Fair  Pain is worse with: walking, bending, sitting, inactivity, standing and some activites Pain improves with: rest, heat/ice, therapy/exercise, pacing activities and medication Relief from Meds: 5  Mobility walk without assistance how many minutes can you walk? 60 ability to climb steps?  yes do you drive?  yes  Function not employed: date last employed .  Neuro/Psych numbness tingling spasms depression anxiety  Prior Studies Any changes since last visit?  no  Physicians involved in your care Any changes since last visit?  no   Family History  Problem Relation Age of Onset  . Arthritis Mother   . Heart disease Mother   . Diabetes Mother   . Heart disease Father   . Diabetes Father    Social History   Social History  . Marital status: Married    Spouse name: N/A  . Number of children: N/A  . Years of education: N/A   Social History Main Topics  . Smoking status: Never Smoker  . Smokeless tobacco: Never Used  . Alcohol use No    . Drug use: No  . Sexual activity: Not Asked   Other Topics Concern  . None   Social History Narrative  . None   History reviewed. No pertinent surgical history. Past Medical History:  Diagnosis Date  . Chronic pain syndrome   . 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 Lumbago   . Pain in joint, lower leg   . Postlaminectomy syndrome, cervical region   . Primary localized osteoarthrosis, lower leg   . Sciatica   . Thoracic radiculopathy    BP 118/76 (BP Location: Left Arm, Patient Position: Sitting, Cuff Size: Normal)   Pulse 98   SpO2 98%   Opioid Risk Score:   Fall Risk Score:  `1  Depression screen PHQ 2/9  Depression screen [facility] 2/9 12/29/2016 01/02/2016 06/13/2015 01/11/2015  Decreased Interest 1 1 0 0  Down, Depressed, Hopeless 1 1 1 1   PHQ - 2 Score 2 2 1 1   Altered sleeping - 2 - 1  Tired, decreased energy - 2 - 1  Change in appetite - 0 - 0  Feeling bad or failure about yourself  - 0 - 0  Trouble concentrating - 0 - 0  Moving slowly or fidgety/restless - 0 - 0  Suicidal thoughts - 0 - 0  PHQ-9 Score - 6 - 3  Difficult doing work/chores - Somewhat difficult - -    Review of Systems  HENT: Negative.   Eyes: Negative.   Respiratory: Negative.   Cardiovascular: Negative.   Gastrointestinal: Negative.   Endocrine: Negative.   Genitourinary: Negative.   Musculoskeletal: Positive for arthralgias, back pain, myalgias and neck pain.       Spasms  Skin: Negative.   Allergic/Immunologic: Negative.   Neurological: Positive for numbness.       Tingling  Hematological: Negative.   Psychiatric/Behavioral: Positive for dysphoric mood. The patient is nervous/anxious.   All other systems reviewed and are negative.      Objective:   Physical Exam  Constitutional: He is oriented to person, place, and time. He appears well-developed and well-nourished.  HENT:  Head: Normocephalic and atraumatic.  Neck: Normal range of motion. Neck supple.   Cardiovascular: Normal rate and regular rhythm.   Pulmonary/Chest: Effort normal and breath sounds normal.  Musculoskeletal:  Normal Muscle Bulk and Muscle Testing Reveals: Upper Extremities: Full ROM and Muscle Strength 5/5 Thoracic Paraspinal Tenderness: T-10- T-12 Mainly Left Side Lumbar Paraspinal Tenderness: L-3-L-5 Mainly Right Side Lower Extremities: Full ROM and Muscle Strength 5/5 Arises from Table with ease Narrow Based Gait  Neurological: He is alert and oriented to person, place, and time.  Skin: Skin is warm and dry.  Psychiatric: He has a normal mood and affect.  Nursing note and vitals reviewed.         Assessment & Plan:  1. Cervical postlaminectomy syndrome, Hx of ACDF C4-5, C6-7, C7-T1, 02/2008. Continue current medication and alternate using heat and ice therapy. 01/28/2017 2. Spondylosis of L-spine . Continue current treatment regime. 01/28/2017 3. Chronic postoperative pain: 01/28/2017 Refilled: Oxycontin 20 mg one tablet every 12 hours#60 and RefilledOxycodone 5/325mg  one tablet twice a day #60.  Continue Voltaren Gel/ Flector Patch We will continue the opioid monitoring program, this consists of regular clinic visits, examinations, urine drug screen, pill counts as well as use of [place] Controlled Substance [place]. 4. 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 Continue Gabapentin. 01/28/2017 Keep blood sugars under tight control. Continue to monitor.  5. Thoracic/ Lumbar Radiculopathy:Continue Gabapentin.01/28/2017 6. Right Hip Pain: Continue Ice and Heat and Current medication regimen.  20 minutes of face to face patient care time was spent during this visit. All questions were encouraged and answered.  F/U in 1 month

## 2017-02-01 ENCOUNTER — Telehealth: Payer: Self-pay | Admitting: Registered Nurse

## 2017-02-01 LAB — TOXASSURE SELECT,+ANTIDEPR,UR

## 2017-02-01 NOTE — Telephone Encounter (Signed)
On 02/01/2017 the  NCCSR was reviewed no conflict was seen on the [facility] Controlled Substance Reporting System with multiple prescribers. David [name] has a signed narcotic contract with our office. If there were any discrepancies this would have been reported to his physician.

## 2017-02-18 ENCOUNTER — Telehealth: Payer: Self-pay | Admitting: Registered Nurse

## 2017-02-18 NOTE — Telephone Encounter (Signed)
David [name] had a UDS performed on 01/28/2017, it was consistent.

## 2017-02-23 ENCOUNTER — Encounter: Payer: BLUE CROSS/BLUE SHIELD | Attending: Physical Medicine & Rehabilitation | Admitting: Registered Nurse

## 2017-02-23 ENCOUNTER — Encounter: Payer: Self-pay | Admitting: Registered Nurse

## 2017-02-23 VITALS — BP 117/75 | HR 102

## 2017-02-23 DIAGNOSIS — M5430 Sciatica, unspecified side: Secondary | ICD-10-CM | POA: Insufficient documentation

## 2017-02-23 DIAGNOSIS — E119 Type 2 diabetes mellitus without complications: Secondary | ICD-10-CM | POA: Diagnosis not present

## 2017-02-23 DIAGNOSIS — Z79899 Other long term (current) drug therapy: Secondary | ICD-10-CM

## 2017-02-23 DIAGNOSIS — G8929 Other chronic pain: Secondary | ICD-10-CM

## 2017-02-23 DIAGNOSIS — M47817 Spondylosis without myelopathy or radiculopathy, lumbosacral region: Secondary | ICD-10-CM | POA: Diagnosis not present

## 2017-02-23 DIAGNOSIS — M5414 Radiculopathy, thoracic region: Secondary | ICD-10-CM

## 2017-02-23 DIAGNOSIS — M5489 Other dorsalgia: Secondary | ICD-10-CM | POA: Diagnosis not present

## 2017-02-23 DIAGNOSIS — M791 Myalgia: Secondary | ICD-10-CM | POA: Diagnosis not present

## 2017-02-23 DIAGNOSIS — M5416 Radiculopathy, lumbar region: Secondary | ICD-10-CM | POA: Diagnosis not present

## 2017-02-23 DIAGNOSIS — M47896 Other spondylosis, lumbar region: Secondary | ICD-10-CM | POA: Insufficient documentation

## 2017-02-23 DIAGNOSIS — M7918 Myalgia, other site: Secondary | ICD-10-CM

## 2017-02-23 DIAGNOSIS — M546 Pain in thoracic spine: Secondary | ICD-10-CM | POA: Diagnosis not present

## 2017-02-23 DIAGNOSIS — M5135 Other intervertebral disc degeneration, thoracolumbar region: Secondary | ICD-10-CM | POA: Insufficient documentation

## 2017-02-23 DIAGNOSIS — G894 Chronic pain syndrome: Secondary | ICD-10-CM | POA: Diagnosis not present

## 2017-02-23 DIAGNOSIS — Z5181 Encounter for therapeutic drug level monitoring: Secondary | ICD-10-CM

## 2017-02-23 DIAGNOSIS — Z760 Encounter for issue of repeat prescription: Secondary | ICD-10-CM | POA: Insufficient documentation

## 2017-02-23 DIAGNOSIS — M961 Postlaminectomy syndrome, not elsewhere classified: Secondary | ICD-10-CM

## 2017-02-23 DIAGNOSIS — M179 Osteoarthritis of knee, unspecified: Secondary | ICD-10-CM | POA: Insufficient documentation

## 2017-02-23 DIAGNOSIS — M79669 Pain in unspecified lower leg: Secondary | ICD-10-CM | POA: Insufficient documentation

## 2017-02-23 DIAGNOSIS — G609 Hereditary and idiopathic neuropathy, unspecified: Secondary | ICD-10-CM

## 2017-02-23 DIAGNOSIS — M5134 Other intervertebral disc degeneration, thoracic region: Secondary | ICD-10-CM | POA: Diagnosis not present

## 2017-02-23 MED ORDER — OXYCODONE-ACETAMINOPHEN 5-325 MG PO TABS: 1.0000 | ORAL_TABLET | Freq: Two times a day (BID) | ORAL | 0 refills | Status: DC

## 2017-02-23 MED ORDER — OXYCODONE HCL ER 20 MG PO T12A: 20.0000 mg | EXTENDED_RELEASE_TABLET | Freq: Two times a day (BID) | ORAL | 0 refills | Status: DC

## 2017-02-23 NOTE — Progress Notes (Signed)
Subjective:    Patient ID: David [name], male    DOB: [birth year], 41 y.o.   MRN: [id number]  HPI: David [name] is a 41 year old male who returns for follow up appointment for chronic pain and medication refill.He states his pain is located in his neck,mid-lower back radiating into his right hip and bilaterallower extremitieswith numbness and tingling L>R. Also states he has tingling and numbness in his upper extremities and muscle spasms in his lower extremities. He rates his pain 6. His current exercise regime is walking and performing stretching exercises.  David [name] states he has noticed problems with his short term memory, recall, can referr him for cognitive testing. He would like to wait until he has reached his deductible.  He was able to remember the three numbers given to him at the end of visit, and how to spell WORLD backwards.  Last UDS was on 01/28/2017, it was consistent.   Pain Inventory Average Pain 5 Pain Right Now 6 My pain is intermittent, constant, burning, dull, stabbing, tingling and aching  In the last 24 hours, has pain interfered with the following? General activity 5 Relation with others 5 Enjoyment of life 6 What TIME of day is your pain at its worst? all Sleep (in general) Fair  Pain is worse with: walking, bending, sitting, inactivity, standing and some activites Pain improves with: rest, heat/ice, therapy/exercise, pacing activities and TENS Relief from Meds: 5  Mobility walk without assistance how many minutes can you walk? 60 ability to climb steps?  yes do you drive?  yes  Function not employed: date last employed 06/2015  Neuro/Psych numbness tingling spasms depression anxiety  Prior Studies Any changes since last visit?  no  Physicians involved in your care Any changes since last visit?  no   Family History  Problem Relation Age of Onset  . Arthritis Mother   . Heart disease Mother   . Diabetes Mother   . Heart disease  Father   . Diabetes Father    Social History   Social History  . Marital status: Married    Spouse name: N/A  . Number of children: N/A  . Years of education: N/A   Social History Main Topics  . Smoking status: Never Smoker  . Smokeless tobacco: Never Used  . Alcohol use No  . Drug use: No  . Sexual activity: Not Asked   Other Topics Concern  . None   Social History Narrative  . None   No past surgical history on file. Past Medical History:  Diagnosis Date  . Chronic pain syndrome   . 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 Lumbago   . Pain in joint, lower leg   . Postlaminectomy syndrome, cervical region   . Primary localized osteoarthrosis, lower leg   . Sciatica   . Thoracic radiculopathy    BP 117/75   Pulse (!) 102   SpO2 97%   Opioid Risk Score:   Fall Risk Score:  `1  Depression screen PHQ 2/9  Depression screen [facility] 2/9 02/23/2017 12/29/2016 01/02/2016 06/13/2015 01/11/2015  Decreased Interest 0 0  Down, Depressed, Hopeless PHQ - 2 Score Altered sleeping - - 2 - 1  Tired, decreased energy - - 2 - 1  Change in appetite - - 0 - 0  Feeling bad or failure about yourself  - - 0 -  0  Trouble concentrating - - 0 - 0  Moving slowly or fidgety/restless - - 0 - 0  Suicidal thoughts - - 0 - 0  PHQ-9 Score - - 6 - 3  Difficult doing work/chores - - Somewhat difficult - -    Review of Systems  Constitutional: Negative.   HENT: Negative.   Eyes: Negative.   Respiratory: Negative.   Cardiovascular: Negative.   Gastrointestinal: Negative.   Endocrine: Negative.   Genitourinary: Negative.   Musculoskeletal:       Spasms  Skin: Negative.   Allergic/Immunologic: Negative.   Neurological: Positive for numbness.       Tingling  Hematological: Negative.   Psychiatric/Behavioral: Positive for dysphoric mood. The patient is nervous/anxious.   All other systems reviewed and are negative.      Objective:    Physical Exam  Constitutional: He is oriented to person, place, and time. He appears well-developed and well-nourished.  HENT:  Head: Normocephalic and atraumatic.  Neck: Normal range of motion. Neck supple.  Cervical Paraspinal Tenderness: C-5-C-6  Cardiovascular: Normal rate and regular rhythm.   Pulmonary/Chest: Effort normal and breath sounds normal.  Musculoskeletal:  Normal Muscle Bulk and Muscle Testing Reveals: Upper Extremities: Full ROM and muscle Strength 5/5 Thoracic Paraspinal Tenderness: T-1-T-3  T-11-T-12 Lower Extremities: Full ROM and Muscle Strength 5/5 Arises from Table with ease Narrow Based Gait   Neurological: He is alert and oriented to person, place, and time.  Skin: Skin is warm and dry.  Psychiatric: He has a normal mood and affect.  Nursing note and vitals reviewed.         Assessment & Plan:  1. Cervical postlaminectomy syndrome, Hx of ACDF C4-5, C6-7, C7-T1, 02/2008. Continue current medication and alternate using heat and ice therapy. 02/23/2017 2. Spondylosis of L-spine . Continue current treatment regime. 02/23/2017 3. Chronic postoperative pain: 02/23/2017 Refilled: Oxycontin 20 mg one tablet every 12 hours#60 and Oxycodone 5/325mg  one tablet twice a day #60.  Continue Voltaren Gel/ Flector Patch We will continue the opioid monitoring program, this consists of regular clinic visits, examinations, urine drug screen, pill counts as well as use of [place] Controlled Substance Reporting System. 4. 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 Continue Gabapentin. 02/23/2017 Keep blood sugars under tight control. Continue to monitor.  5. Thoracic/ Lumbar Radiculopathy:Continue Gabapentin.02/23/2017 6. Right Hip Pain: Continue Ice and Heat and Current medication regimen. 02/23/2017  20 minutes of face to face patient care time was spent during this visit. All questions were encouraged and answered.  F/U in 1 month

## 2017-03-25 ENCOUNTER — Encounter: Payer: Self-pay | Admitting: Registered Nurse

## 2017-03-25 ENCOUNTER — Encounter: Payer: BLUE CROSS/BLUE SHIELD | Attending: Physical Medicine & Rehabilitation | Admitting: Registered Nurse

## 2017-03-25 VITALS — BP 109/72 | HR 102

## 2017-03-25 DIAGNOSIS — M5135 Other intervertebral disc degeneration, thoracolumbar region: Secondary | ICD-10-CM | POA: Diagnosis not present

## 2017-03-25 DIAGNOSIS — M5134 Other intervertebral disc degeneration, thoracic region: Secondary | ICD-10-CM | POA: Insufficient documentation

## 2017-03-25 DIAGNOSIS — G609 Hereditary and idiopathic neuropathy, unspecified: Secondary | ICD-10-CM | POA: Diagnosis not present

## 2017-03-25 DIAGNOSIS — G894 Chronic pain syndrome: Secondary | ICD-10-CM

## 2017-03-25 DIAGNOSIS — M5489 Other dorsalgia: Secondary | ICD-10-CM | POA: Diagnosis not present

## 2017-03-25 DIAGNOSIS — Z5181 Encounter for therapeutic drug level monitoring: Secondary | ICD-10-CM

## 2017-03-25 DIAGNOSIS — E119 Type 2 diabetes mellitus without complications: Secondary | ICD-10-CM | POA: Insufficient documentation

## 2017-03-25 DIAGNOSIS — M5416 Radiculopathy, lumbar region: Secondary | ICD-10-CM | POA: Diagnosis not present

## 2017-03-25 DIAGNOSIS — Z760 Encounter for issue of repeat prescription: Secondary | ICD-10-CM | POA: Insufficient documentation

## 2017-03-25 DIAGNOSIS — M79669 Pain in unspecified lower leg: Secondary | ICD-10-CM | POA: Insufficient documentation

## 2017-03-25 DIAGNOSIS — M546 Pain in thoracic spine: Secondary | ICD-10-CM | POA: Diagnosis not present

## 2017-03-25 DIAGNOSIS — M47896 Other spondylosis, lumbar region: Secondary | ICD-10-CM | POA: Insufficient documentation

## 2017-03-25 DIAGNOSIS — M5414 Radiculopathy, thoracic region: Secondary | ICD-10-CM

## 2017-03-25 DIAGNOSIS — M47817 Spondylosis without myelopathy or radiculopathy, lumbosacral region: Secondary | ICD-10-CM | POA: Diagnosis not present

## 2017-03-25 DIAGNOSIS — Z79899 Other long term (current) drug therapy: Secondary | ICD-10-CM | POA: Diagnosis not present

## 2017-03-25 DIAGNOSIS — M179 Osteoarthritis of knee, unspecified: Secondary | ICD-10-CM | POA: Diagnosis not present

## 2017-03-25 DIAGNOSIS — M5430 Sciatica, unspecified side: Secondary | ICD-10-CM | POA: Diagnosis not present

## 2017-03-25 DIAGNOSIS — G8929 Other chronic pain: Secondary | ICD-10-CM | POA: Diagnosis not present

## 2017-03-25 DIAGNOSIS — M961 Postlaminectomy syndrome, not elsewhere classified: Secondary | ICD-10-CM

## 2017-03-25 MED ORDER — OXYCODONE-ACETAMINOPHEN 5-325 MG PO TABS: 1.0000 | ORAL_TABLET | Freq: Two times a day (BID) | ORAL | 0 refills | Status: DC

## 2017-03-25 MED ORDER — OXYCODONE HCL ER 20 MG PO T12A: 20.0000 mg | EXTENDED_RELEASE_TABLET | Freq: Two times a day (BID) | ORAL | 0 refills | Status: DC

## 2017-03-25 NOTE — Progress Notes (Signed)
Subjective:    Patient ID: David [name], male    DOB: 29-Jun-1976, 41 y.o.   MRN: [id number]  HPI: Mr. David LeedsJohn O [name] is a 41 year old male who returns for follow upappointmentfor chronic pain and medication refill.He states his pain is located in his neck,mid-lower back radiating into his  bilaterallower extremities posteriolywith numbness and tingling L>R.He rates his pain 5.His current exercise regime is walking and performing stretching exercises.   Last UDS was on 01/28/2017, it was consistent.   Pain Inventory Average Pain 5 Pain Right Now 5 My pain is intermittent, constant, sharp, burning, dull, stabbing, tingling and aching  In the last 24 hours, has pain interfered with the following? General activity 5 Relation with others 5 Enjoyment of life 6 What TIME of day is your pain at its worst? all Sleep (in general) Fair  Pain is worse with: walking, bending, sitting, inactivity, standing and some activites Pain improves with: rest, heat/ice, pacing activities and medication Relief from Meds: 4  Mobility walk without assistance ability to climb steps?  yes do you drive?  yes  Function not employed: date last employed 2016  Neuro/Psych numbness tingling spasms depression anxiety  Prior Studies Any changes since last visit?  no  Physicians involved in your care Any changes since last visit?  no   Family History  Problem Relation Age of Onset  . Arthritis Mother   . Heart disease Mother   . Diabetes Mother   . Heart disease Father   . Diabetes Father    Social History   Social History  . Marital status: Married    Spouse name: N/A  . Number of children: N/A  . Years of education: N/A   Social History Main Topics  . Smoking status: Never Smoker  . Smokeless tobacco: Never Used  . Alcohol use No  . Drug use: No  . Sexual activity: Not Asked   Other Topics Concern  . None   Social History Narrative  . None   No past surgical history on  file. Past Medical History:  Diagnosis Date  . Chronic pain syndrome   . 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 Lumbago   . Pain in joint, lower leg   . Postlaminectomy syndrome, cervical region   . Primary localized osteoarthrosis, lower leg   . Sciatica   . Thoracic radiculopathy    BP 109/72   Pulse (!) 102   SpO2 96%   Opioid Risk Score:   Fall Risk Score:  `1  Depression screen PHQ 2/9  [facility] 02/23/2017 12/29/2016 01/02/2016 06/13/2015 01/11/2015  Decreased Interest 1 1 1  0 0  Down, Depressed, Hopeless 1 1 1 1 1   PHQ - 2 Score 2 2 2 1 1   Altered sleeping - - 2 - 1  Tired, decreased energy - - 2 - 1  Change in appetite - - 0 - 0  Feeling bad or failure about yourself  - - 0 - 0  Trouble concentrating - - 0 - 0  Moving slowly or fidgety/restless - - 0 - 0  Suicidal thoughts - - 0 - 0  PHQ-9 Score - - 6 - 3  Difficult doing work/chores - - Somewhat difficult - -    Review of Systems  Constitutional: Negative.   HENT: Negative.   Eyes: Negative.   Respiratory: Negative.   Cardiovascular: Negative.   Gastrointestinal: Negative.   Endocrine: Negative.   Genitourinary: Negative.  Musculoskeletal: Negative.   Skin: Negative.   Allergic/Immunologic: Negative.   Neurological: Negative.   Hematological: Negative.   Psychiatric/Behavioral: Negative.   All other systems reviewed and are negative.      Objective:   Physical Exam  Constitutional: He is oriented to person, place, and time. He appears well-developed.  HENT:  Head: Normocephalic and atraumatic.  Neck: Normal range of motion. Neck supple.  Cardiovascular: Normal rate and regular rhythm.   Pulmonary/Chest: Effort normal and breath sounds normal.  Musculoskeletal:  Normal Muscle Bulk and Muscle Testing Reveals: Upper Extremities: Full ROM and Muscle Strength 5/5 Right: AC Joint Tenderness Thoracic Paraspinal Tenderness: T-5-T-7 T-11-T-12 Lower Extremities:  Full ROM and Muscle Strength 5/5 Arises from Table with ease Narrow Based Gait  Neurological: He is alert and oriented to person, place, and time.  Skin: Skin is warm and dry.  Psychiatric: He has a normal mood and affect.  Nursing note and vitals reviewed.         Assessment & Plan:  1. Cervical postlaminectomy syndrome, Hx of ACDF C4-5, C6-7, C7-T1, 02/2008. Continue current medication and alternate using heat and ice therapy. 03/25/2017 2. Spondylosis of L-spine . Continue current treatment regime. 03/25/2017 3. Chronic postoperative pain: 03/25/2017 Refilled: Oxycontin 20 mg one tablet every 12 hours#60 and Oxycodone 5/325mg  one tablet twice a day #60. Continue Voltaren Gel/ Flector Patch We will continue the opioid monitoring program, this consists of regular clinic visits, examinations, urine drug screen, pill counts as well as use of [place] Controlled Substance Reporting System. 4. 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 Continue Gabapentin. 03/25/2017 Keep blood sugars under tight control. Continue to monitor.  5. Thoracic/ LumbarRadiculopathy:Continue Gabapentin.03/25/2017 6. Right Hip Pain: No complaints Today.Continue Ice/Heat and Current medication regimen. 03/25/2017  20 minutes of face to face patient care time was spent during this visit. All questions were encouraged and answered.  F/U in 1 month

## 2017-03-30 ENCOUNTER — Other Ambulatory Visit: Payer: Self-pay | Admitting: Physical Medicine & Rehabilitation

## 2017-04-27 ENCOUNTER — Encounter: Payer: BLUE CROSS/BLUE SHIELD | Attending: Physical Medicine & Rehabilitation | Admitting: Registered Nurse

## 2017-04-27 ENCOUNTER — Encounter: Payer: Self-pay | Admitting: Registered Nurse

## 2017-04-27 ENCOUNTER — Telehealth: Payer: Self-pay

## 2017-04-27 VITALS — BP 106/75 | HR 98

## 2017-04-27 DIAGNOSIS — M79669 Pain in unspecified lower leg: Secondary | ICD-10-CM | POA: Diagnosis not present

## 2017-04-27 DIAGNOSIS — M961 Postlaminectomy syndrome, not elsewhere classified: Secondary | ICD-10-CM

## 2017-04-27 DIAGNOSIS — Z760 Encounter for issue of repeat prescription: Secondary | ICD-10-CM | POA: Insufficient documentation

## 2017-04-27 DIAGNOSIS — G894 Chronic pain syndrome: Secondary | ICD-10-CM | POA: Diagnosis not present

## 2017-04-27 DIAGNOSIS — Z79899 Other long term (current) drug therapy: Secondary | ICD-10-CM

## 2017-04-27 DIAGNOSIS — E119 Type 2 diabetes mellitus without complications: Secondary | ICD-10-CM | POA: Diagnosis not present

## 2017-04-27 DIAGNOSIS — M5430 Sciatica, unspecified side: Secondary | ICD-10-CM | POA: Insufficient documentation

## 2017-04-27 DIAGNOSIS — M47896 Other spondylosis, lumbar region: Secondary | ICD-10-CM | POA: Diagnosis not present

## 2017-04-27 DIAGNOSIS — M25551 Pain in right hip: Secondary | ICD-10-CM

## 2017-04-27 DIAGNOSIS — M5414 Radiculopathy, thoracic region: Secondary | ICD-10-CM | POA: Diagnosis not present

## 2017-04-27 DIAGNOSIS — M7918 Myalgia, other site: Secondary | ICD-10-CM

## 2017-04-27 DIAGNOSIS — M5135 Other intervertebral disc degeneration, thoracolumbar region: Secondary | ICD-10-CM | POA: Diagnosis not present

## 2017-04-27 DIAGNOSIS — M5134 Other intervertebral disc degeneration, thoracic region: Secondary | ICD-10-CM | POA: Diagnosis not present

## 2017-04-27 DIAGNOSIS — M179 Osteoarthritis of knee, unspecified: Secondary | ICD-10-CM | POA: Insufficient documentation

## 2017-04-27 DIAGNOSIS — G8929 Other chronic pain: Secondary | ICD-10-CM | POA: Diagnosis not present

## 2017-04-27 DIAGNOSIS — M47817 Spondylosis without myelopathy or radiculopathy, lumbosacral region: Secondary | ICD-10-CM

## 2017-04-27 DIAGNOSIS — Z5181 Encounter for therapeutic drug level monitoring: Secondary | ICD-10-CM | POA: Diagnosis not present

## 2017-04-27 DIAGNOSIS — M791 Myalgia: Secondary | ICD-10-CM

## 2017-04-27 DIAGNOSIS — M5412 Radiculopathy, cervical region: Secondary | ICD-10-CM

## 2017-04-27 DIAGNOSIS — M5489 Other dorsalgia: Secondary | ICD-10-CM | POA: Diagnosis not present

## 2017-04-27 DIAGNOSIS — G609 Hereditary and idiopathic neuropathy, unspecified: Secondary | ICD-10-CM

## 2017-04-27 MED ORDER — OXYCODONE HCL ER 20 MG PO T12A: 20.0000 mg | EXTENDED_RELEASE_TABLET | Freq: Two times a day (BID) | ORAL | 0 refills | Status: DC

## 2017-04-27 MED ORDER — GABAPENTIN 100 MG PO CAPS: ORAL_CAPSULE | ORAL | 1 refills | Status: DC

## 2017-04-27 MED ORDER — OXYCODONE-ACETAMINOPHEN 5-325 MG PO TABS: 1.0000 | ORAL_TABLET | Freq: Two times a day (BID) | ORAL | 0 refills | Status: DC

## 2017-04-27 MED ORDER — GABAPENTIN 400 MG PO CAPS: ORAL_CAPSULE | ORAL | 1 refills | Status: DC

## 2017-04-27 NOTE — Telephone Encounter (Signed)
Error

## 2017-04-27 NOTE — Progress Notes (Signed)
Subjective:    Patient ID: David [name], male    DOB: 07/13/1976, 41 y.o.   MRN: [id number]  HPI:  [name] is a 41 year old male who returns for follow upappointmentfor chronic pain and medication refill.He states his pain is located in his neck radiating into his right shoulder, left arm with tingling and numbness radiating into his pinky,mid-lower back radiating into his  bilaterallower extremities posteriolywith numbness and tingling L>R.He rates his pain 6.His current exercise regime is walking and performing stretching exercises.   Last UDS was on 01/28/2017, it was consistent.    Pain Inventory Average Pain 5 Pain Right Now 6 My pain is constant, sharp, burning, dull, stabbing, tingling and aching  In the last 24 hours, has pain interfered with the following? General activity 6 Relation with others 6 Enjoyment of life 6 What TIME of day is your pain at its worst? all Sleep (in general) Fair  Pain is worse with: walking, bending, sitting, inactivity, standing and some activites Pain improves with: rest, heat/ice, therapy/exercise, pacing activities and medication Relief from Meds: 4  Mobility walk without assistance  Function not employed: date last employed 2016  Neuro/Psych numbness tingling spasms depression anxiety  Prior Studies Any changes since last visit?  no  Physicians involved in your care Any changes since last visit?  no   Family History  Problem Relation Age of Onset  . Arthritis Mother   . Heart disease Mother   . Diabetes Mother   . Heart disease Father   . Diabetes Father    Social History   Social History  . Marital status: Married    Spouse name: N/A  . Number of children: N/A  . Years of education: N/A   Social History Main Topics  . Smoking status: Never Smoker  . Smokeless tobacco: Never Used  . Alcohol use No  . Drug use: No  . Sexual activity: Not Asked   Other Topics Concern  . None   Social  History Narrative  . None   No past surgical history on file. Past Medical History:  Diagnosis Date  . Chronic pain syndrome   . 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 Lumbago   . Pain in joint, lower leg   . Postlaminectomy syndrome, cervical region   . Primary localized osteoarthrosis, lower leg   . Sciatica   . Thoracic radiculopathy    BP 106/75   Pulse 98   SpO2 98%   Opioid Risk Score:   Fall Risk Score:  `1  Depression screen PHQ 2/9  Depression screen [facility] 2/9 02/23/2017 12/29/2016 01/02/2016 06/13/2015 01/11/2015  Decreased Interest 1 1 1  0 0  Down, Depressed, Hopeless 1 1 1 1 1   PHQ - 2 Score 2 2 2 1 1   Altered sleeping - - 2 - 1  Tired, decreased energy - - 2 - 1  Change in appetite - - 0 - 0  Feeling bad or failure about yourself  - - 0 - 0  Trouble concentrating - - 0 - 0  Moving slowly or fidgety/restless - - 0 - 0  Suicidal thoughts - - 0 - 0  PHQ-9 Score - - 6 - 3  Difficult doing work/chores - - Somewhat difficult - -     Review of Systems  Constitutional: Negative.   HENT: Negative.   Eyes: Negative.   Respiratory: Negative.   Cardiovascular: Negative.   Gastrointestinal: Negative.  Endocrine: Negative.   Genitourinary: Negative.   Musculoskeletal: Negative.   Skin: Negative.   Allergic/Immunologic: Negative.   Neurological: Negative.   Hematological: Negative.   Psychiatric/Behavioral: Negative.   All other systems reviewed and are negative.      Objective:   Physical Exam  Constitutional: He is oriented to person, place, and time. He appears well-developed and well-nourished.  HENT:  Head: Normocephalic and atraumatic.  Neck: Normal range of motion. Neck supple.  Cardiovascular: Normal rate and regular rhythm.   Pulmonary/Chest: Effort normal and breath sounds normal.  Musculoskeletal:  Normal Muscle Bulk and Muscle Testing Reveals: Upper Extremities: Full ROM and Muscle Strength 5/5 Bilateral AC Joint  Tenderness Thoracic Paraspinal Tenderness: T-10- T-12 Lumbar Paraspinal Tenderness: L-4-L-5 Lower Extremities: Full ROM and Muscle Strength 5/5 Arises from Table with ease Narrow Based Gait  Neurological: He is alert and oriented to person, place, and time.  Skin: Skin is warm and dry.  Psychiatric: He has a normal mood and affect.  Nursing note and vitals reviewed.         Assessment & Plan:  1. Cervical postlaminectomy syndrome, Hx of ACDF C4-5, C6-7, C7-T1, 02/2008. Continue current medication and alternate using heat and ice therapy. 04/27/2017 2. Spondylosis of L-spine . Continue current treatment regime. 04/27/2017 3. Chronic postoperative pain: 04/27/2017 Refilled: Oxycontin 20 mg one tablet every 12 hours#60 and Oxycodone 5/325mg  one tablet twice a day #60. Continue Voltaren Gel/ Flector Patch We will continue the opioid monitoring program, this consists of regular clinic visits, examinations, urine drug screen, pill counts as well as use of [place] Cont[place] Substance [place]. 4. 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 Continue Gabapentin. 04/27/2017 Keep blood sugars under tight control. Continue to monitor.  5. Thoracic/ LumbarRadiculopathy:Continue Gabapentin.04/27/2017 6. Right Hip Pain: No complaints Today.Continue Ice/Heat and Current medication regimen. 04/27/2017  20nminutes of face to face patient care time was spent during this visit. All questions were encouraged and answered.     F/U in 1 month

## 2017-04-29 ENCOUNTER — Encounter: Payer: BLUE CROSS/BLUE SHIELD | Admitting: Registered Nurse

## 2017-05-02 LAB — TOXASSURE SELECT,+ANTIDEPR,UR

## 2017-05-12 ENCOUNTER — Telehealth: Payer: Self-pay | Admitting: *Deleted

## 2017-05-12 NOTE — Telephone Encounter (Signed)
Urine drug screen for this encounter is consistent for prescribed medication 

## 2017-05-27 ENCOUNTER — Encounter: Payer: BLUE CROSS/BLUE SHIELD | Attending: Physical Medicine & Rehabilitation | Admitting: Registered Nurse

## 2017-05-27 ENCOUNTER — Encounter: Payer: Self-pay | Admitting: Registered Nurse

## 2017-05-27 VITALS — BP 113/74 | HR 101

## 2017-05-27 DIAGNOSIS — G8929 Other chronic pain: Secondary | ICD-10-CM

## 2017-05-27 DIAGNOSIS — M5430 Sciatica, unspecified side: Secondary | ICD-10-CM | POA: Diagnosis not present

## 2017-05-27 DIAGNOSIS — M961 Postlaminectomy syndrome, not elsewhere classified: Secondary | ICD-10-CM

## 2017-05-27 DIAGNOSIS — G609 Hereditary and idiopathic neuropathy, unspecified: Secondary | ICD-10-CM | POA: Diagnosis not present

## 2017-05-27 DIAGNOSIS — M47817 Spondylosis without myelopathy or radiculopathy, lumbosacral region: Secondary | ICD-10-CM

## 2017-05-27 DIAGNOSIS — M25551 Pain in right hip: Secondary | ICD-10-CM

## 2017-05-27 DIAGNOSIS — Z79899 Other long term (current) drug therapy: Secondary | ICD-10-CM

## 2017-05-27 DIAGNOSIS — M79669 Pain in unspecified lower leg: Secondary | ICD-10-CM | POA: Insufficient documentation

## 2017-05-27 DIAGNOSIS — Z760 Encounter for issue of repeat prescription: Secondary | ICD-10-CM | POA: Diagnosis present

## 2017-05-27 DIAGNOSIS — M5134 Other intervertebral disc degeneration, thoracic region: Secondary | ICD-10-CM | POA: Diagnosis not present

## 2017-05-27 DIAGNOSIS — M7918 Myalgia, other site: Secondary | ICD-10-CM

## 2017-05-27 DIAGNOSIS — M791 Myalgia: Secondary | ICD-10-CM

## 2017-05-27 DIAGNOSIS — M47896 Other spondylosis, lumbar region: Secondary | ICD-10-CM | POA: Insufficient documentation

## 2017-05-27 DIAGNOSIS — M5489 Other dorsalgia: Secondary | ICD-10-CM | POA: Diagnosis not present

## 2017-05-27 DIAGNOSIS — M546 Pain in thoracic spine: Secondary | ICD-10-CM

## 2017-05-27 DIAGNOSIS — Z5181 Encounter for therapeutic drug level monitoring: Secondary | ICD-10-CM

## 2017-05-27 DIAGNOSIS — M5412 Radiculopathy, cervical region: Secondary | ICD-10-CM

## 2017-05-27 DIAGNOSIS — G894 Chronic pain syndrome: Secondary | ICD-10-CM

## 2017-05-27 DIAGNOSIS — E119 Type 2 diabetes mellitus without complications: Secondary | ICD-10-CM | POA: Insufficient documentation

## 2017-05-27 DIAGNOSIS — M5414 Radiculopathy, thoracic region: Secondary | ICD-10-CM | POA: Diagnosis not present

## 2017-05-27 DIAGNOSIS — M5135 Other intervertebral disc degeneration, thoracolumbar region: Secondary | ICD-10-CM | POA: Insufficient documentation

## 2017-05-27 DIAGNOSIS — M179 Osteoarthritis of knee, unspecified: Secondary | ICD-10-CM | POA: Diagnosis not present

## 2017-05-27 MED ORDER — OXYCODONE HCL ER 20 MG PO T12A: 20.0000 mg | EXTENDED_RELEASE_TABLET | Freq: Two times a day (BID) | ORAL | 0 refills | Status: DC

## 2017-05-27 MED ORDER — OXYCODONE-ACETAMINOPHEN 5-325 MG PO TABS: 1.0000 | ORAL_TABLET | Freq: Two times a day (BID) | ORAL | 0 refills | Status: DC

## 2017-05-27 NOTE — Progress Notes (Signed)
Subjective:    Patient ID: David [name], male    DOB: 03-20-1976, 41 y.o.   MRN: [id number]  HPI: David [name] is a 41 year old male who returns for follow upappointmentfor chronic pain and medication refill.He states his pain is located in his neck radiating into his rig shoulder, left arm with tingling and numbness,mid-lower back radiating into his bilaterallower extremities posteriolywith numbness and tingling L>R.Also states he has joint pain in his bilateral hands. He rates his pain 6.His current exercise regime is walking and performing stretching exercises.   David [name] states he will call his insurance to make sure they will cover the EMG/ NCV, he will call office if it's covered, to schedule with Dr. Wynn BankerKirsteins.   Last UDS was on 04/27/2017, it was consistent.   Pain Inventory Average Pain 5 Pain Right Now 6 My pain is intermittent, constant, sharp, burning, dull, stabbing, tingling and aching  In the last 24 hours, has pain interfered with the following? General activity 6 Relation with others 6 Enjoyment of life 6 What TIME of day is your pain at its worst? all Sleep (in general) Fair  Pain is worse with: walking, bending, sitting, inactivity, standing and some activites Pain improves with: rest, heat/ice, therapy/exercise, pacing activities and medication Relief from Meds: 5  Mobility walk without assistance how many minutes can you walk? 40 ability to climb steps?  yes do you drive?  yes  Function not employed: date last employed 06/2015  Neuro/Psych numbness tingling spasms depression anxiety  Prior Studies Any changes since last visit?  no  Physicians involved in your care Any changes since last visit?  no   Family History  Problem Relation Age of Onset  . Arthritis Mother   . Heart disease Mother   . Diabetes Mother   . Heart disease Father   . Diabetes Father    Social History   Social History  . Marital status: Married   Spouse name: N/A  . Number of children: N/A  . Years of education: N/A   Social History Main Topics  . Smoking status: Never Smoker  . Smokeless tobacco: Never Used  . Alcohol use No  . Drug use: No  . Sexual activity: Not Asked   Other Topics Concern  . None   Social History Narrative  . None   History reviewed. No pertinent surgical history. Past Medical History:  Diagnosis Date  . Chronic pain syndrome   . 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 Lumbago   . Pain in joint, lower leg   . Postlaminectomy syndrome, cervical region   . Primary localized osteoarthrosis, lower leg   . Sciatica   . Thoracic radiculopathy    BP 113/74   Pulse (!) 101   SpO2 98%   Opioid Risk Score:  2 Fall Risk Score:  `1  Depression screen PHQ 2/9  [facility] 05/27/2017 02/23/2017 12/29/2016 01/02/2016 06/13/2015 01/11/2015  Decreased Interest 1 1 1 1  0 0  Down, Depressed, Hopeless 1 1 1 1 1 1   PHQ - 2 Score 2 2 2 2 1 1   Altered sleeping - - - 2 - 1  Tired, decreased energy - - - 2 - 1  Change in appetite - - - 0 - 0  Feeling bad or failure about yourself  - - - 0 - 0  Trouble concentrating - - - 0 - 0  Moving slowly or fidgety/restless - - -  0 - 0  Suicidal thoughts - - - 0 - 0  PHQ-9 Score - - - 6 - 3  Difficult doing work/chores - - - Somewhat difficult - -    Review of Systems  Constitutional: Negative.   HENT: Negative.   Eyes: Negative.   Respiratory: Negative.   Cardiovascular: Negative.   Gastrointestinal: Negative.   Endocrine: Negative.   Genitourinary: Negative.   Musculoskeletal: Negative.   Skin: Negative.   Allergic/Immunologic: Negative.   Neurological: Negative.   Hematological: Negative.   Psychiatric/Behavioral: Negative.   All other systems reviewed and are negative.      Objective:   Physical Exam  Constitutional: He is oriented to person, place, and time. He appears well-developed and well-nourished.  HENT:    Head: Normocephalic and atraumatic.  Neck: Normal range of motion. Neck supple.  Cervical Paraspinal Tenderness: C-5-C-6  Cardiovascular: Normal rate and regular rhythm.   Pulmonary/Chest: Effort normal and breath sounds normal.  Musculoskeletal:  Normal Muscle Bulk and Muscle Testing Reveals: Upper Extremities: Full ROM and Muscle Strength 5/5 Thoracic Paraspinal Tenderness: T-2-T-4 T-7-T-9 Lumbar Paraspinal Tenderness: L-3-L-5 Lower Extremities: Full ROM and Muscle Strength 5/5 Arises from chair with ease Narrow Based Gait   Neurological: He is alert and oriented to person, place, and time.  Skin: Skin is warm and dry.  Psychiatric: He has a normal mood and affect.  Nursing note and vitals reviewed.         Assessment & Plan:  1. Cervical postlaminectomy syndrome, Hx of ACDF C4-5, C6-7, C7-T1, 02/2008. Continue current medication and alternate using heat and ice therapy. 05/27/2017 2. Spondylosis of L-spine . Continue current treatment regime. 05/27/2017 3. Chronic postoperative pain: 05/27/2017 Refilled: Oxycontin 20 mg one tablet every 12 hours#60 and Oxycodone 5/325mg  one tablet twice a day #60. Continue Voltaren Gel/ Flector Patch We will continue the opioid monitoring program, this consists of regular clinic visits, examinations, urine drug screen, pill counts as well as use of [place] Controlled Substance Reporting System. 4. 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 Continue Gabapentin. 05/27/2017 Keep blood sugars under tight control. Continue to monitor.  5. Thoracic/ LumbarRadiculopathy:Continue Gabapentin.05/27/2017 6. Right Hip Pain: No complaints Today.Continue Ice/Heat and Current medication regimen. 05/27/2017  20 minutes of face to face patient care time was spent during this visit. All questions were encouraged and answered.    F/U in 1 month

## 2017-06-23 ENCOUNTER — Encounter: Payer: BLUE CROSS/BLUE SHIELD | Attending: Physical Medicine & Rehabilitation | Admitting: Registered Nurse

## 2017-06-23 ENCOUNTER — Encounter: Payer: Self-pay | Admitting: Registered Nurse

## 2017-06-23 ENCOUNTER — Telehealth: Payer: Self-pay | Admitting: Registered Nurse

## 2017-06-23 VITALS — BP 111/70 | HR 114 | Resp 14

## 2017-06-23 DIAGNOSIS — M47817 Spondylosis without myelopathy or radiculopathy, lumbosacral region: Secondary | ICD-10-CM

## 2017-06-23 DIAGNOSIS — M546 Pain in thoracic spine: Secondary | ICD-10-CM | POA: Diagnosis not present

## 2017-06-23 DIAGNOSIS — M5412 Radiculopathy, cervical region: Secondary | ICD-10-CM | POA: Diagnosis not present

## 2017-06-23 DIAGNOSIS — M5489 Other dorsalgia: Secondary | ICD-10-CM | POA: Diagnosis not present

## 2017-06-23 DIAGNOSIS — G609 Hereditary and idiopathic neuropathy, unspecified: Secondary | ICD-10-CM

## 2017-06-23 DIAGNOSIS — E119 Type 2 diabetes mellitus without complications: Secondary | ICD-10-CM | POA: Diagnosis not present

## 2017-06-23 DIAGNOSIS — Z5181 Encounter for therapeutic drug level monitoring: Secondary | ICD-10-CM

## 2017-06-23 DIAGNOSIS — M179 Osteoarthritis of knee, unspecified: Secondary | ICD-10-CM | POA: Diagnosis not present

## 2017-06-23 DIAGNOSIS — G894 Chronic pain syndrome: Secondary | ICD-10-CM | POA: Diagnosis not present

## 2017-06-23 DIAGNOSIS — M5134 Other intervertebral disc degeneration, thoracic region: Secondary | ICD-10-CM | POA: Insufficient documentation

## 2017-06-23 DIAGNOSIS — M7061 Trochanteric bursitis, right hip: Secondary | ICD-10-CM

## 2017-06-23 DIAGNOSIS — M47896 Other spondylosis, lumbar region: Secondary | ICD-10-CM | POA: Insufficient documentation

## 2017-06-23 DIAGNOSIS — G8929 Other chronic pain: Secondary | ICD-10-CM

## 2017-06-23 DIAGNOSIS — M5430 Sciatica, unspecified side: Secondary | ICD-10-CM | POA: Diagnosis not present

## 2017-06-23 DIAGNOSIS — M79669 Pain in unspecified lower leg: Secondary | ICD-10-CM | POA: Insufficient documentation

## 2017-06-23 DIAGNOSIS — M5414 Radiculopathy, thoracic region: Secondary | ICD-10-CM

## 2017-06-23 DIAGNOSIS — Z79899 Other long term (current) drug therapy: Secondary | ICD-10-CM | POA: Diagnosis not present

## 2017-06-23 DIAGNOSIS — M5135 Other intervertebral disc degeneration, thoracolumbar region: Secondary | ICD-10-CM | POA: Diagnosis not present

## 2017-06-23 DIAGNOSIS — Z760 Encounter for issue of repeat prescription: Secondary | ICD-10-CM | POA: Insufficient documentation

## 2017-06-23 DIAGNOSIS — M791 Myalgia: Secondary | ICD-10-CM

## 2017-06-23 DIAGNOSIS — M7918 Myalgia, other site: Secondary | ICD-10-CM

## 2017-06-23 DIAGNOSIS — M961 Postlaminectomy syndrome, not elsewhere classified: Secondary | ICD-10-CM

## 2017-06-23 MED ORDER — OXYCODONE HCL ER 20 MG PO T12A: 20.0000 mg | EXTENDED_RELEASE_TABLET | Freq: Two times a day (BID) | ORAL | 0 refills | Status: DC

## 2017-06-23 MED ORDER — OXYCODONE-ACETAMINOPHEN 5-325 MG PO TABS: 1.0000 | ORAL_TABLET | Freq: Two times a day (BID) | ORAL | 0 refills | Status: DC

## 2017-06-23 NOTE — Telephone Encounter (Signed)
On 06/23/2017 the  NCCSR was reviewed no conflict was seen on the [facility] Controlled Substance Reporting System with multiple prescribers. [name] has a signed narcotic contract with our office. If there were any discrepancies this would have been reported to his physician.

## 2017-06-23 NOTE — Progress Notes (Signed)
Subjective:    Patient ID: David [name], male    DOB: 02/01/1976, 41 y.o.   MRN: [id number]  HPI: David [name] is a 41year old male who returns for follow upappointmentfor chronic pain and medication refill.He states his pain is located in his neck radiating into his righ shoulder, left arm with tingling and numbness,mid-lower back radiating into his bilaterallower extremities posteriolywith numbness and tingling L>R and right hip pain. He rates his pain 6.His current exercise regime is walking and performing stretching exercises.   David [name] states he will schedule the EMG/ NCV,once he has the co-pay.  Last UDS was on 04/27/2017, it was consistent.    Pain Inventory Average Pain 5 Pain Right Now 6 My pain is intermittent, constant, sharp, burning, dull, stabbing, tingling and aching  In the last 24 hours, has pain interfered with the following? General activity 6 Relation with others 6 Enjoyment of life 6 What TIME of day is your pain at its worst? all Sleep (in general) Fair  Pain is worse with: walking, bending, sitting, inactivity, standing and some activites Pain improves with: rest, heat/ice, pacing activities and medication Relief from Meds: 4  Mobility walk without assistance how many minutes can you walk? 45 ability to climb steps?  yes do you drive?  yes  Function not employed: date last employed .  Neuro/Psych numbness tingling spasms depression anxiety  Prior Studies Any changes since last visit?  no  Physicians involved in your care Any changes since last visit?  no   Family History  Problem Relation Age of Onset  . Arthritis Mother   . Heart disease Mother   . Diabetes Mother   . Heart disease Father   . Diabetes Father    Social History   Social History  . Marital status: Married    Spouse name: N/A  . Number of children: N/A  . Years of education: N/A   Social History Main Topics  . Smoking status: Never Smoker  .  Smokeless tobacco: Never Used  . Alcohol use No  . Drug use: No  . Sexual activity: Not Asked   Other Topics Concern  . None   Social History Narrative  . None   History reviewed. No pertinent surgical history. Past Medical History:  Diagnosis Date  . Chronic pain syndrome   . 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 Lumbago   . Pain in joint, lower leg   . Postlaminectomy syndrome, cervical region   . Primary localized osteoarthrosis, lower leg   . Sciatica   . Thoracic radiculopathy    BP 111/70 (BP Location: Left Arm, Patient Position: Sitting, Cuff Size: Normal)   Pulse (!) 114   Resp 14   SpO2 98%   Opioid Risk Score:   Fall Risk Score:  `1  Depression screen PHQ 2/9  Depression screen [facility] 2/9 05/27/2017 02/23/2017 12/29/2016 01/02/2016 06/13/2015 01/11/2015  Decreased Interest 1 1 1 1  0 0  Down, Depressed, Hopeless 1 1 1 1 1 1   PHQ - 2 Score 2 2 2 2 1 1   Altered sleeping - - - 2 - 1  Tired, decreased energy - - - 2 - 1  Change in appetite - - - 0 - 0  Feeling bad or failure about yourself  - - - 0 - 0  Trouble concentrating - - - 0 - 0  Moving slowly or fidgety/restless - - - 0 - 0  Suicidal thoughts - - -  0 - 0  PHQ-9 Score - - - 6 - 3  Difficult doing work/chores - - - Somewhat difficult - -    Review of Systems  Constitutional: Negative.   HENT: Negative.   Eyes: Negative.   Respiratory: Negative.   Cardiovascular: Negative.   Gastrointestinal: Negative.   Endocrine: Negative.   Genitourinary: Negative.   Musculoskeletal: Positive for arthralgias, back pain, myalgias, neck pain and neck stiffness.       Spasms   Skin: Negative.   Allergic/Immunologic: Negative.   Neurological: Positive for numbness and headaches.       Tingling  Hematological: Negative.   Psychiatric/Behavioral: Negative.        Objective:   Physical Exam  Constitutional: He is oriented to person, place, and time. He appears well-developed and  well-nourished.  HENT:  Head: Normocephalic and atraumatic.  Neck: Normal range of motion. Neck supple.  Cardiovascular: Normal rate and regular rhythm.   Pulmonary/Chest: Effort normal and breath sounds normal.  Musculoskeletal:  Normal Muscle Bulk and Muscle Testing Reveals: Upper Extremities: Full ROM and Muscle Strength 5/5 Thoracic Paraspinal Tenderness: T-6-T-8  T-10- T-12 Lumbar Hypersensitivity Right Greater Trochanter Tenderness Lower Extremities: Full ROM and Muscle Strength 5/5 Arises from Table with ease Narrow Based gait   Neurological: He is alert and oriented to person, place, and time.  Skin: Skin is warm and dry.  Psychiatric: He has a normal mood and affect.  Nursing note and vitals reviewed.         Assessment & Plan:  1. Cervical postlaminectomy syndrome, Hx of ACDF C4-5, C6-7, C7-T1, 02/2008. Continue current medication and alternate using heat and ice therapy. 06/23/2017 2. Spondylosis of L-spine . Continue current treatment regime. 06/23/2017 3. Chronic postoperative pain: 06/23/2017 Refilled: Oxycontin 20 mg one tablet every 12 hours#60 and Oxycodone 5/325mg  one tablet twice a day #60. Continue Voltaren Gel/ Flector Patch We will continue the opioid monitoring program, this consists of regular clinic visits, examinations, urine drug screen, pill counts as well as use of [place] Controlled Substance Reporting System. 4. 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 Continue Gabapentin. 06/23/2017 Keep blood sugars under tight control. Continue to monitor.  5. Cervical Radiculopathy/ Thoracic/ LumbarRadiculopathy:Continue Gabapentin.06/23/2017 6. Greater Right Trochanter Bursitis:Continue Ice/Heat and Current medication regimen. 06/23/2017 7. Chronic Pain Syndrome/ Myofascial Pain: Continue Flector Patch  20 minutes of face to face patient care time was spent during this visit. All questions were encouraged and answered.  F/U in 1 month

## 2017-07-27 ENCOUNTER — Encounter: Payer: BLUE CROSS/BLUE SHIELD | Attending: Physical Medicine & Rehabilitation

## 2017-07-27 ENCOUNTER — Ambulatory Visit (HOSPITAL_BASED_OUTPATIENT_CLINIC_OR_DEPARTMENT_OTHER): Payer: BLUE CROSS/BLUE SHIELD | Admitting: Physical Medicine & Rehabilitation

## 2017-07-27 ENCOUNTER — Encounter: Payer: Self-pay | Admitting: Physical Medicine & Rehabilitation

## 2017-07-27 VITALS — BP 124/78 | HR 111 | Resp 14

## 2017-07-27 DIAGNOSIS — M961 Postlaminectomy syndrome, not elsewhere classified: Secondary | ICD-10-CM | POA: Diagnosis not present

## 2017-07-27 DIAGNOSIS — M5134 Other intervertebral disc degeneration, thoracic region: Secondary | ICD-10-CM | POA: Insufficient documentation

## 2017-07-27 DIAGNOSIS — E0842 Diabetes mellitus due to underlying condition with diabetic polyneuropathy: Secondary | ICD-10-CM

## 2017-07-27 DIAGNOSIS — M5430 Sciatica, unspecified side: Secondary | ICD-10-CM | POA: Insufficient documentation

## 2017-07-27 DIAGNOSIS — M5135 Other intervertebral disc degeneration, thoracolumbar region: Secondary | ICD-10-CM | POA: Insufficient documentation

## 2017-07-27 DIAGNOSIS — M79669 Pain in unspecified lower leg: Secondary | ICD-10-CM | POA: Insufficient documentation

## 2017-07-27 DIAGNOSIS — M5414 Radiculopathy, thoracic region: Secondary | ICD-10-CM | POA: Insufficient documentation

## 2017-07-27 DIAGNOSIS — M179 Osteoarthritis of knee, unspecified: Secondary | ICD-10-CM | POA: Insufficient documentation

## 2017-07-27 DIAGNOSIS — E119 Type 2 diabetes mellitus without complications: Secondary | ICD-10-CM | POA: Diagnosis not present

## 2017-07-27 DIAGNOSIS — M47896 Other spondylosis, lumbar region: Secondary | ICD-10-CM | POA: Insufficient documentation

## 2017-07-27 DIAGNOSIS — M5489 Other dorsalgia: Secondary | ICD-10-CM | POA: Diagnosis not present

## 2017-07-27 DIAGNOSIS — G8929 Other chronic pain: Secondary | ICD-10-CM | POA: Insufficient documentation

## 2017-07-27 DIAGNOSIS — Z760 Encounter for issue of repeat prescription: Secondary | ICD-10-CM | POA: Diagnosis present

## 2017-07-27 DIAGNOSIS — R202 Paresthesia of skin: Secondary | ICD-10-CM | POA: Diagnosis not present

## 2017-07-27 MED ORDER — OXYCODONE HCL ER 20 MG PO T12A: 20.0000 mg | EXTENDED_RELEASE_TABLET | Freq: Two times a day (BID) | ORAL | 0 refills | Status: DC

## 2017-07-27 MED ORDER — OXYCODONE-ACETAMINOPHEN 5-325 MG PO TABS: 1.0000 | ORAL_TABLET | Freq: Two times a day (BID) | ORAL | 0 refills | Status: DC

## 2017-07-27 NOTE — Patient Instructions (Signed)
Please let Riley Lam know if you like a referral to our neuropsychologist, Dr. Renee Pain continue current medication

## 2017-07-27 NOTE — Progress Notes (Signed)
Subjective:    Patient ID: David [name], male    DOB: June 08, 1976, 41 y.o.   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 General activity 5 Relation with others 5 Enjoyment of life 6 What TIME of day is your pain at its worst? all Sleep (in general) Fair  Pain is worse with: walking, bending, sitting, inactivity, standing and some activites Pain improves with: rest, heat/ice, pacing activities and medication Relief from Meds: 5  Mobility walk without assistance how many minutes can you walk? 40 ability to climb steps?  yes do you drive?  yes  Function not employed: date last employed .  Neuro/Psych numbness tingling spasms depression anxiety  Prior Studies Any changes since last visit?  no  Physicians involved in your care Any changes since last visit?  no   Family History  Problem Relation Age of Onset  . Arthritis Mother   . Heart disease Mother   . Diabetes Mother   . Heart disease Father   . Diabetes Father    Social History   Social History  . Marital status: Married    Spouse name: N/A  . Number of children: N/A  . Years of education: N/A   Social History Main Topics  . Smoking status: Never Smoker  . Smokeless tobacco: Never Used  . Alcohol use No  . Drug use: No  . Sexual activity: Not Asked   Other Topics Concern  . None   Social History Narrative  . None   History reviewed. No pertinent surgical history. Past Medical History:    Diagnosis Date  . Chronic pain syndrome   . 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 Lumbago   . Pain in joint, lower leg   . Postlaminectomy syndrome, cervical region   . Primary localized osteoarthrosis, lower leg   . Sciatica   . Thoracic radiculopathy    BP 124/78 (BP Location: Right Arm, Patient Position: Sitting, Cuff Size: Normal)   Pulse (!) 111   Resp 14   SpO2 95%   Opioid Risk Score:   Fall Risk Score:  `1  Depression screen PHQ 2/9  Depression screen [facility] 2/9 05/27/2017 02/23/2017 12/29/2016 01/02/2016 06/13/2015 01/11/2015  Decreased Interest 0 0  Down, Depressed, Hopeless PHQ - 2 Score Altered sleeping - - - 2 - 1  Tired, decreased energy - - - 2 - 1  Change in appetite - - - 0 - 0  Feeling bad or failure about yourself  - - - 0 - 0  Trouble concentrating - - - 0 - 0  Moving slowly or fidgety/restless - - - 0 - 0  Suicidal thoughts - - - 0 - 0  PHQ-9 Score - - - 6 - 3  Difficult doing work/chores - - - Somewhat difficult - -    Review of  Systems  Constitutional: Negative.   HENT: Negative.   Eyes: Negative.   Respiratory: Negative.   Cardiovascular: Negative.   Gastrointestinal: Negative.   Endocrine: Negative.   Genitourinary: Negative.   Musculoskeletal: Positive for arthralgias, back pain, neck pain and neck stiffness.       Spasms   Skin: Negative.   Allergic/Immunologic: Negative.   Neurological: Positive for numbness.       Tingling   Hematological: Negative.   Psychiatric/Behavioral: Negative.        Objective:   Physical Exam  Constitutional: He is oriented to person, place, and time. He appears well-developed and well-nourished.  HENT:  Head: Normocephalic and atraumatic.  Eyes: Pupils are equal, round, and reactive to light. Conjunctivae and EOM are normal.  Neck:  Reduced cervical range of motion  Neurological: He is alert and oriented to person, place, and time.  Reflex  Scores:      Tricep reflexes are 1+ on the right side and 2+ on the left side.      Bicep reflexes are 1+ on the right side and 2+ on the left side.      Brachioradialis reflexes are 1+ on the right side and 2+ on the left side.      Patellar reflexes are 1+ on the right side and 1+ on the left side.      Achilles reflexes are 0 on the right side and 0 on the left side. Psychiatric: His speech is normal. Judgment and thought content normal. His affect is blunt. He is withdrawn. Cognition and memory are normal.  Nursing note and vitals reviewed.  Sensation is intact to pinprick, bilateral C6 dermatomal distribution but decreased C7 and C8 Motor strength is 5/5 bilateral deltoids by stress, grip, hip flexion, knee extension, ankle dorsi flexor. Gait is normal       Assessment & Plan:  1. Cervical postlaminectomy syndrome. His left upper extremity paresthesia is not clearly radicular, he does have prior history of diabetic polyneuropathy and this could also be playing a role. In addition, compressive neuropathies such as median or ulnar in the differential diagnosis. Recommend EMG NCV which the patient thinks he can schedule fairly soon, financial considerations are an issue  Continue OxyContin 20 mg twice a day Continue oxycodone 5 g twice a day Continue gabapentin 500 mg 4 times a day   Discussed PMP overdose risk 330, ~12x risk over the general public, we discussed factors that can make score go up or down, such as opioid dose, multiple prescribers and dosage of benzodiazepines. He is on Valium 5 mg twice a day from another physician  Over half of the 25 min visit was spent counseling and coordinating care.

## 2017-08-24 ENCOUNTER — Encounter: Payer: Self-pay | Admitting: Physical Medicine & Rehabilitation

## 2017-08-24 ENCOUNTER — Ambulatory Visit (HOSPITAL_BASED_OUTPATIENT_CLINIC_OR_DEPARTMENT_OTHER): Payer: BLUE CROSS/BLUE SHIELD | Admitting: Physical Medicine & Rehabilitation

## 2017-08-24 ENCOUNTER — Encounter: Payer: BLUE CROSS/BLUE SHIELD | Attending: Physical Medicine & Rehabilitation

## 2017-08-24 VITALS — BP 113/74 | HR 107 | Resp 14

## 2017-08-24 DIAGNOSIS — M961 Postlaminectomy syndrome, not elsewhere classified: Secondary | ICD-10-CM | POA: Diagnosis not present

## 2017-08-24 DIAGNOSIS — E0842 Diabetes mellitus due to underlying condition with diabetic polyneuropathy: Secondary | ICD-10-CM

## 2017-08-24 DIAGNOSIS — M179 Osteoarthritis of knee, unspecified: Secondary | ICD-10-CM | POA: Diagnosis not present

## 2017-08-24 DIAGNOSIS — G8929 Other chronic pain: Secondary | ICD-10-CM | POA: Insufficient documentation

## 2017-08-24 DIAGNOSIS — R202 Paresthesia of skin: Secondary | ICD-10-CM

## 2017-08-24 DIAGNOSIS — M5135 Other intervertebral disc degeneration, thoracolumbar region: Secondary | ICD-10-CM | POA: Insufficient documentation

## 2017-08-24 DIAGNOSIS — M5430 Sciatica, unspecified side: Secondary | ICD-10-CM | POA: Insufficient documentation

## 2017-08-24 DIAGNOSIS — M79669 Pain in unspecified lower leg: Secondary | ICD-10-CM | POA: Diagnosis not present

## 2017-08-24 DIAGNOSIS — M5489 Other dorsalgia: Secondary | ICD-10-CM | POA: Diagnosis not present

## 2017-08-24 DIAGNOSIS — Z760 Encounter for issue of repeat prescription: Secondary | ICD-10-CM | POA: Insufficient documentation

## 2017-08-24 DIAGNOSIS — E119 Type 2 diabetes mellitus without complications: Secondary | ICD-10-CM | POA: Diagnosis not present

## 2017-08-24 DIAGNOSIS — M47896 Other spondylosis, lumbar region: Secondary | ICD-10-CM | POA: Insufficient documentation

## 2017-08-24 DIAGNOSIS — M5134 Other intervertebral disc degeneration, thoracic region: Secondary | ICD-10-CM | POA: Insufficient documentation

## 2017-08-24 DIAGNOSIS — M5414 Radiculopathy, thoracic region: Secondary | ICD-10-CM | POA: Diagnosis not present

## 2017-08-24 MED ORDER — OXYCODONE HCL ER 20 MG PO T12A: 20.0000 mg | EXTENDED_RELEASE_TABLET | Freq: Two times a day (BID) | ORAL | 0 refills | Status: DC

## 2017-08-24 MED ORDER — OXYCODONE-ACETAMINOPHEN 5-325 MG PO TABS: 1.0000 | ORAL_TABLET | Freq: Two times a day (BID) | ORAL | 0 refills | Status: DC

## 2017-08-24 NOTE — Progress Notes (Signed)
Subjective:    Patient ID: David [name], male    DOB: 11-Jan-1976, 41 y.o.   MRN: [id number]  HPI  Patient is still helping parents at home, driving them to appointments  Left post arm and palm numbness , all fingers, seems a little worse.  Symptoms come and go and does not seem to be activity related  Bilateral legs have a more constant burning pain, feet worse than legs Worse with either too much standing and walking or too little  Pain Inventory Average Pain 5 Pain Right Now 5 My pain is intermittent, constant, sharp, burning, dull, stabbing, tingling and aching  In the last 24 hours, has pain interfered with the following? General activity 5 Relation with others 5 Enjoyment of life 6 What TIME of day is your pain at its worst? all Sleep (in general) all  Pain is worse with: walking, bending, sitting, inactivity, standing and some activites Pain improves with: rest, therapy/exercise, pacing activities and medication Relief from Meds: 4  Mobility walk without assistance how many minutes can you walk? 45 ability to climb steps?  yes do you drive?  yes  Function not employed: date last employed .  Neuro/Psych numbness tingling spasms depression anxiety  Prior Studies Any changes since last visit?  no  Physicians involved in your care Any changes since last visit?  no   Family History  Problem Relation Age of Onset  . Arthritis Mother   . Heart disease Mother   . Diabetes Mother   . Heart disease Father   . Diabetes Father    Social History   Social History  . Marital status: Married    Spouse name: N/A  . Number of children: N/A  . Years of education: N/A   Social History Main Topics  . Smoking status: Never Smoker  . Smokeless tobacco: Never Used  . Alcohol use No  . Drug use: No  . Sexual activity: Not Asked   Other Topics Concern  . None   Social History Narrative  . None   History reviewed. No pertinent surgical history. Past  Medical History:  Diagnosis Date  . Chronic pain syndrome   . 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 Lumbago   . Pain in joint, lower leg   . Postlaminectomy syndrome, cervical region   . Primary localized osteoarthrosis, lower leg   . Sciatica   . Thoracic radiculopathy    BP 113/74 (BP Location: Right Arm, Patient Position: Sitting, Cuff Size: Normal)   Pulse (!) 107   Resp 14   SpO2 96%   Opioid Risk Score:   Fall Risk Score:  `1  Depression screen PHQ 2/9  [facility] 05/27/2017 02/23/2017 12/29/2016 01/02/2016 06/13/2015 01/11/2015  Decreased Interest 1 1 1 1  0 0  Down, Depressed, Hopeless 1 1 1 1 1 1   PHQ - 2 Score 2 2 2 2 1 1   Altered sleeping - - - 2 - 1  Tired, decreased energy - - - 2 - 1  Change in appetite - - - 0 - 0  Feeling bad or failure about yourself  - - - 0 - 0  Trouble concentrating - - - 0 - 0  Moving slowly or fidgety/restless - - - 0 - 0  Suicidal thoughts - - - 0 - 0  PHQ-9 Score - - - 6 - 3  Difficult doing work/chores - - - Somewhat difficult - -    Review  of Systems  Constitutional: Negative.   HENT: Negative.   Eyes: Negative.   Respiratory: Negative.   Cardiovascular: Negative.   Gastrointestinal: Negative.   Endocrine: Negative.   Genitourinary: Negative.   Musculoskeletal: Positive for arthralgias, back pain, myalgias, neck pain and neck stiffness.       Spasms  Neurological: Positive for numbness and headaches.       Tingling   Hematological: Negative.   Psychiatric/Behavioral: Positive for dysphoric mood. The patient is nervous/anxious.   All other systems reviewed and are negative.      Objective:   Physical Exam  Constitutional: He is oriented to person, place, and time. He appears well-developed and well-nourished.  HENT:  Head: Normocephalic and atraumatic.  Eyes: Pupils are equal, round, and reactive to light. Conjunctivae and EOM are normal.  Neck:  Apical spine range of motion is  50% flexion extension 75% looking toward the right and 50% range looking toward the left.  Neurological: He is alert and oriented to person, place, and time.  Psychiatric: He has a normal mood and affect. His behavior is normal. Judgment and thought content normal.  Nursing note and vitals reviewed.  Decreased pinprick sensation left pinky finger Intact light touch bilateral C5 C6-C7-C8 dermatome distribution  Negative impingement testing Negative O'Brien's test left side Neg Spurling's  Motor strength is 5/5 bilateral deltoid, bicep, tricep, grip 5/5 bilateral hip flexion knee extension ankle dorsiflexion       Assessment & Plan:  1. Cervical postlaminectomy syndrome. His left upper extremity paresthesia is not clearly radicular, he does have prior history of diabetic polyneuropathy and this could also be playing a role. In addition, compressive neuropathies such as median or ulnar in the differential diagnosis. Recommend EMG NCV which the patient thinks he can schedule fairly soon, financial considerations are an issue We will be looking specifically for ulnar neuropathy as well as for signs of peripheral polyneuropathy.  Continue OxyContin 20 mg twice a day Continue oxycodone 5 g twice a day Continue gabapentin 500 mg 4 times a day helps mainly LE pains  Continue opioid monitoring program. This consists of regular clinic visits, examinations, urine drug screen, pill counts as well as use of [place] controlled substance reporting System. Last urine screen 04/27/2017 was appropriate PMP aware data reviewed,NARX score 512, overdose risk 360

## 2017-09-30 ENCOUNTER — Ambulatory Visit: Payer: BLUE CROSS/BLUE SHIELD | Admitting: Physical Medicine & Rehabilitation

## 2017-09-30 ENCOUNTER — Encounter: Payer: BLUE CROSS/BLUE SHIELD | Attending: Physical Medicine & Rehabilitation

## 2017-09-30 ENCOUNTER — Encounter: Payer: Self-pay | Admitting: Physical Medicine & Rehabilitation

## 2017-09-30 VITALS — BP 114/77 | HR 118

## 2017-09-30 DIAGNOSIS — M961 Postlaminectomy syndrome, not elsewhere classified: Secondary | ICD-10-CM | POA: Diagnosis not present

## 2017-09-30 DIAGNOSIS — M5430 Sciatica, unspecified side: Secondary | ICD-10-CM | POA: Diagnosis not present

## 2017-09-30 DIAGNOSIS — M5414 Radiculopathy, thoracic region: Secondary | ICD-10-CM | POA: Insufficient documentation

## 2017-09-30 DIAGNOSIS — M79669 Pain in unspecified lower leg: Secondary | ICD-10-CM | POA: Diagnosis not present

## 2017-09-30 DIAGNOSIS — M47896 Other spondylosis, lumbar region: Secondary | ICD-10-CM | POA: Diagnosis not present

## 2017-09-30 DIAGNOSIS — M5134 Other intervertebral disc degeneration, thoracic region: Secondary | ICD-10-CM | POA: Insufficient documentation

## 2017-09-30 DIAGNOSIS — M179 Osteoarthritis of knee, unspecified: Secondary | ICD-10-CM | POA: Insufficient documentation

## 2017-09-30 DIAGNOSIS — R202 Paresthesia of skin: Secondary | ICD-10-CM | POA: Diagnosis not present

## 2017-09-30 DIAGNOSIS — Z760 Encounter for issue of repeat prescription: Secondary | ICD-10-CM | POA: Insufficient documentation

## 2017-09-30 DIAGNOSIS — M5135 Other intervertebral disc degeneration, thoracolumbar region: Secondary | ICD-10-CM | POA: Insufficient documentation

## 2017-09-30 DIAGNOSIS — E119 Type 2 diabetes mellitus without complications: Secondary | ICD-10-CM | POA: Insufficient documentation

## 2017-09-30 DIAGNOSIS — R200 Anesthesia of skin: Secondary | ICD-10-CM

## 2017-09-30 DIAGNOSIS — M5489 Other dorsalgia: Secondary | ICD-10-CM | POA: Insufficient documentation

## 2017-09-30 DIAGNOSIS — G8929 Other chronic pain: Secondary | ICD-10-CM | POA: Insufficient documentation

## 2017-09-30 MED ORDER — GABAPENTIN 400 MG PO CAPS: ORAL_CAPSULE | ORAL | 1 refills | Status: DC

## 2017-09-30 MED ORDER — OXYCODONE-ACETAMINOPHEN 5-325 MG PO TABS: 1.0000 | ORAL_TABLET | Freq: Two times a day (BID) | ORAL | 0 refills | Status: DC

## 2017-09-30 MED ORDER — GABAPENTIN 100 MG PO CAPS: ORAL_CAPSULE | ORAL | 1 refills | Status: DC

## 2017-09-30 MED ORDER — OXYCODONE HCL ER 20 MG PO T12A: 20.0000 mg | EXTENDED_RELEASE_TABLET | Freq: Two times a day (BID) | ORAL | 0 refills | Status: DC

## 2017-09-30 NOTE — Patient Instructions (Signed)
Diabetic neuropathy no ulnar neuropathy at elbow

## 2017-10-01 NOTE — Progress Notes (Signed)
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 No evidence of median or ulnar compressive neuropathy

## 2017-10-28 ENCOUNTER — Encounter: Payer: BLUE CROSS/BLUE SHIELD | Attending: Physical Medicine & Rehabilitation | Admitting: Registered Nurse

## 2017-10-28 ENCOUNTER — Encounter: Payer: Self-pay | Admitting: Registered Nurse

## 2017-10-28 VITALS — BP 122/73 | HR 103 | Resp 14

## 2017-10-28 DIAGNOSIS — M79669 Pain in unspecified lower leg: Secondary | ICD-10-CM | POA: Diagnosis not present

## 2017-10-28 DIAGNOSIS — M5489 Other dorsalgia: Secondary | ICD-10-CM | POA: Diagnosis not present

## 2017-10-28 DIAGNOSIS — G609 Hereditary and idiopathic neuropathy, unspecified: Secondary | ICD-10-CM | POA: Diagnosis not present

## 2017-10-28 DIAGNOSIS — G894 Chronic pain syndrome: Secondary | ICD-10-CM | POA: Diagnosis not present

## 2017-10-28 DIAGNOSIS — M5412 Radiculopathy, cervical region: Secondary | ICD-10-CM | POA: Diagnosis not present

## 2017-10-28 DIAGNOSIS — M542 Cervicalgia: Secondary | ICD-10-CM | POA: Diagnosis not present

## 2017-10-28 DIAGNOSIS — M5430 Sciatica, unspecified side: Secondary | ICD-10-CM | POA: Diagnosis not present

## 2017-10-28 DIAGNOSIS — Z5181 Encounter for therapeutic drug level monitoring: Secondary | ICD-10-CM

## 2017-10-28 DIAGNOSIS — M5135 Other intervertebral disc degeneration, thoracolumbar region: Secondary | ICD-10-CM | POA: Insufficient documentation

## 2017-10-28 DIAGNOSIS — M7918 Myalgia, other site: Secondary | ICD-10-CM | POA: Diagnosis not present

## 2017-10-28 DIAGNOSIS — E119 Type 2 diabetes mellitus without complications: Secondary | ICD-10-CM | POA: Insufficient documentation

## 2017-10-28 DIAGNOSIS — M5416 Radiculopathy, lumbar region: Secondary | ICD-10-CM | POA: Diagnosis not present

## 2017-10-28 DIAGNOSIS — M7061 Trochanteric bursitis, right hip: Secondary | ICD-10-CM | POA: Diagnosis not present

## 2017-10-28 DIAGNOSIS — G8929 Other chronic pain: Secondary | ICD-10-CM | POA: Insufficient documentation

## 2017-10-28 DIAGNOSIS — M179 Osteoarthritis of knee, unspecified: Secondary | ICD-10-CM | POA: Insufficient documentation

## 2017-10-28 DIAGNOSIS — M546 Pain in thoracic spine: Secondary | ICD-10-CM | POA: Diagnosis not present

## 2017-10-28 DIAGNOSIS — M5134 Other intervertebral disc degeneration, thoracic region: Secondary | ICD-10-CM | POA: Insufficient documentation

## 2017-10-28 DIAGNOSIS — M5414 Radiculopathy, thoracic region: Secondary | ICD-10-CM | POA: Diagnosis not present

## 2017-10-28 DIAGNOSIS — M47817 Spondylosis without myelopathy or radiculopathy, lumbosacral region: Secondary | ICD-10-CM | POA: Diagnosis not present

## 2017-10-28 DIAGNOSIS — M961 Postlaminectomy syndrome, not elsewhere classified: Secondary | ICD-10-CM | POA: Diagnosis not present

## 2017-10-28 DIAGNOSIS — M47896 Other spondylosis, lumbar region: Secondary | ICD-10-CM | POA: Insufficient documentation

## 2017-10-28 DIAGNOSIS — Z79899 Other long term (current) drug therapy: Secondary | ICD-10-CM

## 2017-10-28 DIAGNOSIS — Z760 Encounter for issue of repeat prescription: Secondary | ICD-10-CM | POA: Diagnosis present

## 2017-10-28 MED ORDER — OXYCODONE-ACETAMINOPHEN 5-325 MG PO TABS: 1.0000 | ORAL_TABLET | Freq: Two times a day (BID) | ORAL | 0 refills | Status: DC

## 2017-10-28 MED ORDER — OXYCODONE HCL ER 20 MG PO T12A
20.0000 mg | EXTENDED_RELEASE_TABLET | Freq: Two times a day (BID) | ORAL | 0 refills | Status: DC
Start: 2017-10-28 — End: 2017-11-25

## 2017-10-28 NOTE — Progress Notes (Signed)
Subjective:    Patient ID: David [name], male    DOB: 06/11/1976, 41 y.o.   MRN: [id number]  HPI: David [name] is a [age]year old male who returns for follow upappointmentfor chronic pain and medication refill. He states his pain is located in his neck radiating into his left shoulder, left with tingling and numbness, also reportsmid-lower back radiating into his bilaterallower extremitieswith numbness and tingling L>R and right hip pain.Also states he has a headache, He rates his pain 5.His current exercise regime is walking and performing stretching exercises.   S/P EMG, diagnosed with diabetic neuropathy.   [name] is 75.00 MME. He is also prescribed Diazepan  by Mauricio Poegina York NP.We have discussed the black box warning of using opioids and benzodiazepines. I highlighted the dangers of using these drugs together and discussed the adverse events including respiratory suppression, overdose, cognitive impairment and importance of  compliance with current regimen. He verbalizes understanding, we will continue to monitor and adjust as indicated.     Last UDS was on 04/27/2017, it was consistent.    Pain Inventory Average Pain 5 Pain Right Now 5 My pain is intermittent, constant, sharp, burning, dull, stabbing, tingling and aching  In the last 24 hours, has pain interfered with the following? General activity 6 Relation with others 6 Enjoyment of life 6 What TIME of day is your pain at its worst? all Sleep (in general) Fair  Pain is worse with: walking, bending, sitting, inactivity, standing and some activites Pain improves with: rest, heat/ice, therapy/exercise, pacing activities and medication Relief from Meds: 5  Mobility walk without assistance how many minutes can you walk? 40 ability to climb steps?  yes do you drive?  yes  Function not employed: date last employed . Do you have any goals in this area?   yes  Neuro/Psych numbness tingling spasms depression anxiety  Prior Studies Any changes since last visit?  no  Physicians involved in your care Any changes since last visit?  no   Family History  Problem Relation Age of Onset  . Arthritis Mother   . Heart disease Mother   . Diabetes Mother   . Heart disease Father   . Diabetes Father    Social History   Socioeconomic History  . Marital status: Married    Spouse name: None  . Number of children: None  . Years of education: None  . Highest education level: None  Social Needs  . Financial resource strain: None  . Food insecurity - worry: None  . Food insecurity - inability: None  . Transportation needs - medical: None  . Transportation needs - non-medical: None  Occupational History  . None  Tobacco Use  . Smoking status: Never Smoker  . Smokeless tobacco: Never Used  Substance and Sexual Activity  . Alcohol use: No  . Drug use: No  . Sexual activity: None  Other Topics Concern  . None  Social History Narrative  . None   History reviewed. No pertinent surgical history. Past Medical History:  Diagnosis Date  . Chronic pain syndrome   . 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 Lumbago   . Pain in joint, lower leg   . Postlaminectomy syndrome, cervical region   . Primary localized osteoarthrosis, lower leg   . Sciatica   . Thoracic radiculopathy    BP 122/73 (BP Location: Left Arm, Patient Position: Sitting, Cuff Size: Normal)   Pulse (!) 103  Resp 14   SpO2 98%   Opioid Risk Score:   Fall Risk Score:  `1  Depression screen PHQ 2/9  [facility] screen [facility] 2/9 05/27/2017 02/23/2017 12/29/2016 01/02/2016 06/13/2015 01/11/2015  Decreased Interest 1 1 1 1  0 0  Down, Depressed, Hopeless 1 1 1 1 1 1   PHQ - 2 Score 2 2 2 2 1 1   Altered sleeping - - - 2 - 1  Tired, decreased energy - - - 2 - 1  Change in appetite - - - 0 - 0  Feeling bad or failure about yourself  - - - 0 - 0   Trouble concentrating - - - 0 - 0  Moving slowly or fidgety/restless - - - 0 - 0  Suicidal thoughts - - - 0 - 0  PHQ-9 Score - - - 6 - 3  Difficult doing work/chores - - - Somewhat difficult - -    Review of Systems  Constitutional: Negative.   HENT: Negative.   Eyes: Negative.   Respiratory: Negative.   Cardiovascular: Negative.   Gastrointestinal: Negative.   Endocrine: Negative.   Genitourinary: Negative.   Musculoskeletal: Positive for arthralgias, back pain, myalgias, neck pain and neck stiffness.       Spasms   Skin: Negative.   Allergic/Immunologic: Negative.   Neurological: Positive for numbness and headaches.       Tingling  Hematological: Negative.   Psychiatric/Behavioral: Positive for dysphoric mood. The patient is nervous/anxious.        Objective:   Physical Exam  Constitutional: He is oriented to person, place, and time. He appears well-developed and well-nourished.  HENT:  Head: Normocephalic and atraumatic.  Neck: Normal range of motion. Neck supple.  Cervical Paraspinal Tenderness: C-5-C-6 Mainly Right Side  Cardiovascular: Normal rate and regular rhythm.  Pulmonary/Chest: Effort normal and breath sounds normal.  Musculoskeletal:  Normal Muscle Bulk and Muscle Testing Reveals: Upper Extremities: Full ROM and Muscle Strength 5/5 Thoracic Paraspinal Tenderness: T-7- T-9 Lumbar Paraspinal Tenderness: L-3-L-5 Right Greater Trochanter Tenderness Lower Extremities: Full ROM and Muscle Strength 5/5 Arises from Table with ease Narrow Based gait   Neurological: He is alert and oriented to person, place, and time.  Skin: Skin is warm and dry.  Psychiatric: He has a normal mood and affect.  Nursing note and vitals reviewed.         Assessment & Plan:  1. Cervical postlaminectomy syndrome, Hx of ACDF C4-5, C6-7, C7-T1, 02/2008. Continue current medication and alternate using heat and ice therapy. 10/28/2017 2. Spondylosis of L-spine . Continue current  treatment regime. 121/27/2018 3. Chronic postoperative pain: 10/28/2017 Refilled: Oxycontin 20 mg one tablet every 12 hours#60 and Oxycodone 5/325mg  one tablet twice a day #60. Continue Voltaren Gel/ Flector Patch We will continue the opioid monitoring program, this consists of regular clinic visits, examinations, urine drug screen, pill counts as well as use of [place]. 4. 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 Continue Gabapentin. 10/28/2017 Keep blood sugars under tight control. Continue to monitor.  5. Cervical Radiculopathy/ Thoracic/ LumbarRadiculopathy:Continue Gabapentin.10/28/2017 6. Greater Right Trochanter Bursitis:Continue Ice/Heat and Current medication regimen. 10/28/2017 7. Chronic Pain Syndrome/ Myofascial Pain: Continue Flector Patch/ Alternates with Voltaren Gel. 10/28/2017   20 minutes of face to face patient care time was spent during this visit. All questions were encouraged and answered.  F/U in 1 month

## 2017-11-25 ENCOUNTER — Encounter: Payer: BLUE CROSS/BLUE SHIELD | Attending: Physical Medicine & Rehabilitation | Admitting: Registered Nurse

## 2017-11-25 ENCOUNTER — Encounter: Payer: Self-pay | Admitting: Registered Nurse

## 2017-11-25 VITALS — BP 110/70 | HR 99 | Resp 14

## 2017-11-25 DIAGNOSIS — Z79899 Other long term (current) drug therapy: Secondary | ICD-10-CM

## 2017-11-25 DIAGNOSIS — G8929 Other chronic pain: Secondary | ICD-10-CM | POA: Diagnosis not present

## 2017-11-25 DIAGNOSIS — M546 Pain in thoracic spine: Secondary | ICD-10-CM

## 2017-11-25 DIAGNOSIS — M179 Osteoarthritis of knee, unspecified: Secondary | ICD-10-CM | POA: Insufficient documentation

## 2017-11-25 DIAGNOSIS — G894 Chronic pain syndrome: Secondary | ICD-10-CM

## 2017-11-25 DIAGNOSIS — M5489 Other dorsalgia: Secondary | ICD-10-CM | POA: Insufficient documentation

## 2017-11-25 DIAGNOSIS — M7918 Myalgia, other site: Secondary | ICD-10-CM | POA: Diagnosis not present

## 2017-11-25 DIAGNOSIS — M79669 Pain in unspecified lower leg: Secondary | ICD-10-CM | POA: Insufficient documentation

## 2017-11-25 DIAGNOSIS — M5134 Other intervertebral disc degeneration, thoracic region: Secondary | ICD-10-CM | POA: Insufficient documentation

## 2017-11-25 DIAGNOSIS — Z5181 Encounter for therapeutic drug level monitoring: Secondary | ICD-10-CM

## 2017-11-25 DIAGNOSIS — M961 Postlaminectomy syndrome, not elsewhere classified: Secondary | ICD-10-CM

## 2017-11-25 DIAGNOSIS — M5430 Sciatica, unspecified side: Secondary | ICD-10-CM | POA: Diagnosis not present

## 2017-11-25 DIAGNOSIS — M5412 Radiculopathy, cervical region: Secondary | ICD-10-CM | POA: Diagnosis not present

## 2017-11-25 DIAGNOSIS — Z760 Encounter for issue of repeat prescription: Secondary | ICD-10-CM | POA: Diagnosis not present

## 2017-11-25 DIAGNOSIS — M47896 Other spondylosis, lumbar region: Secondary | ICD-10-CM | POA: Diagnosis not present

## 2017-11-25 DIAGNOSIS — M47817 Spondylosis without myelopathy or radiculopathy, lumbosacral region: Secondary | ICD-10-CM | POA: Diagnosis not present

## 2017-11-25 DIAGNOSIS — M5135 Other intervertebral disc degeneration, thoracolumbar region: Secondary | ICD-10-CM | POA: Diagnosis not present

## 2017-11-25 DIAGNOSIS — M7061 Trochanteric bursitis, right hip: Secondary | ICD-10-CM | POA: Diagnosis not present

## 2017-11-25 DIAGNOSIS — M5414 Radiculopathy, thoracic region: Secondary | ICD-10-CM | POA: Diagnosis not present

## 2017-11-25 DIAGNOSIS — E119 Type 2 diabetes mellitus without complications: Secondary | ICD-10-CM | POA: Diagnosis not present

## 2017-11-25 DIAGNOSIS — M5416 Radiculopathy, lumbar region: Secondary | ICD-10-CM

## 2017-11-25 DIAGNOSIS — M542 Cervicalgia: Secondary | ICD-10-CM | POA: Diagnosis not present

## 2017-11-25 DIAGNOSIS — G609 Hereditary and idiopathic neuropathy, unspecified: Secondary | ICD-10-CM | POA: Diagnosis not present

## 2017-11-25 MED ORDER — OXYCODONE HCL ER 20 MG PO T12A: 20.0000 mg | EXTENDED_RELEASE_TABLET | Freq: Two times a day (BID) | ORAL | 0 refills | Status: DC

## 2017-11-25 MED ORDER — OXYCODONE-ACETAMINOPHEN 5-325 MG PO TABS: 1.0000 | ORAL_TABLET | Freq: Two times a day (BID) | ORAL | 0 refills | Status: DC

## 2017-11-25 NOTE — Progress Notes (Signed)
Subjective:    Patient ID: David [name], male    DOB: Aug 12, 1976, 42 y.o.   MRN: [id number]  HPI: [name] is a 42year old male who returns for follow upappointmentfor chronic pain and medication refill. He states his pain is located in his neck radiating into his left shoulder and hands with tingling and numbness. Also reports mid-lower back pain radiating into left lower extremity also right hip pain. [name] states he's having increase frequency and intensity of neuropathic pain, unable to increase gabapentin due to side effects, refuses amitriptyline, we discussed Tapentadol in detail he will make a decision and call office. All questions answered and he verbalizes understanding.  He rates his pain 6.His current exercise regime is walking and performing stretching exercises.   S/P EMG, diagnosed with diabetic neuropathy.   [name] is 82.50 MME. He is also prescribed Diazepan  by Mauricio Po NP.We have discussed the black box warning again regarding using opioids and benzodiazepines. I highlighted the dangers of using these drugs together and discussed the adverse events including respiratory suppression, overdose, cognitive impairment and importance of  compliance with current regimen. He verbalizes understanding, we will continue to monitor and adjust as indicated.    Last UDS was on 04/27/2017, it was consistent. Oral Swab Performed Today.   Pain Inventory Average Pain 5 Pain Right Now 6 My pain is intermittent, constant, sharp, burning, dull, stabbing, tingling and aching  In the last 24 hours, has pain interfered with the following? General activity 6 Relation with others 5 Enjoyment of life 6 What TIME of day is your pain at its worst? all Sleep (in general) Fair  Pain is worse with: walking, bending, sitting, inactivity, standing and some activites Pain improves with: rest, heat/ice, therapy/exercise, pacing activities and  medication Relief from Meds: 5  Mobility walk without assistance how many minutes can you walk? 45 ability to climb steps?  yes do you drive?  yes  Function not employed: date last employed . Do you have any goals in this area?  yes  Neuro/Psych numbness tingling spasms depression anxiety  Prior Studies Any changes since last visit?  no  Physicians involved in your care Any changes since last visit?  no   Family History  Problem Relation Age of Onset  . Arthritis Mother   . Heart disease Mother   . Diabetes Mother   . Heart disease Father   . Diabetes Father    Social History   Socioeconomic History  . Marital status: Married    Spouse name: None  . Number of children: None  . Years of education: None  . Highest education level: None  Social Needs  . Financial resource strain: None  . Food insecurity - worry: None  . Food insecurity - inability: None  . Transportation needs - medical: None  . Transportation needs - non-medical: None  Occupational History  . None  Tobacco Use  . Smoking status: Never Smoker  . Smokeless tobacco: Never Used  Substance and Sexual Activity  . Alcohol use: No  . Drug use: No  . Sexual activity: None  Other Topics Concern  . None  Social History Narrative  . None   History reviewed. No pertinent surgical history. Past Medical History:  Diagnosis Date  . Chronic pain syndrome   . 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 Lumbago   . Pain in joint, lower leg   . Postlaminectomy syndrome,  cervical region   . Primary localized osteoarthrosis, lower leg   . Sciatica   . Thoracic radiculopathy    There were no vitals taken for this visit.  Opioid Risk Score:   Fall Risk Score:  `1  Depression screen PHQ 2/9  [facility] 05/27/2017 02/23/2017 12/29/2016 01/02/2016 06/13/2015 01/11/2015  Decreased Interest 1 1 1 1  0 0  Down, Depressed, Hopeless 1 1 1 1 1 1   PHQ - 2 Score 2 2 2 2 1 1    Altered sleeping - - - 2 - 1  Tired, decreased energy - - - 2 - 1  Change in appetite - - - 0 - 0  Feeling bad or failure about yourself  - - - 0 - 0  Trouble concentrating - - - 0 - 0  Moving slowly or fidgety/restless - - - 0 - 0  Suicidal thoughts - - - 0 - 0  PHQ-9 Score - - - 6 - 3  Difficult doing work/chores - - - Somewhat difficult - -    Review of Systems  Constitutional: Negative.   HENT: Negative.   Eyes: Negative.   Respiratory: Negative.   Cardiovascular: Negative.   Gastrointestinal: Negative.   Endocrine: Negative.   Genitourinary: Negative.   Musculoskeletal: Positive for arthralgias, back pain, myalgias, neck pain and neck stiffness.       Spasms   Skin: Negative.   Allergic/Immunologic: Negative.   Neurological: Positive for numbness and headaches.       Tingling  Hematological: Negative.   Psychiatric/Behavioral: Positive for dysphoric mood. The patient is nervous/anxious.        Objective:   Physical Exam  Constitutional: He is oriented to person, place, and time. He appears well-developed and well-nourished.  HENT:  Head: Normocephalic and atraumatic.  Neck: Normal range of motion. Neck supple.  Cervical Paraspinal Tenderness: C-5-C-6 Mainly Right Side  Cardiovascular: Normal rate and regular rhythm.  Pulmonary/Chest: Effort normal and breath sounds normal.  Musculoskeletal:  Normal Muscle Bulk and Muscle Testing Reveals: Upper Extremities: Full ROM and Muscle Strength 5/5 Thoracic Hypersensitivity Lumbar Paraspinal Tenderness: L-3-L-5 Right Greater Trochanter Tenderness Lower Extremities: Full ROM and Muscle Strength 5/5 Arises from Table with ease Narrow Based gait   Neurological: He is alert and oriented to person, place, and time.  Skin: Skin is warm and dry.  Psychiatric: He has a normal mood and affect.  Nursing note and vitals reviewed.         Assessment & Plan:  1. Cervical postlaminectomy syndrome, Hx of ACDF C4-5, C6-7,  C7-T1, 02/2008. Continue current medication and alternate using heat and ice therapy. 11/25/2017 2. Spondylosis of L-spine . Continue current treatment regime. 11/25/2017 3. Chronic postoperative pain: 11/25/2017 Refilled: Oxycontin 20 mg one tablet every 12 hours#60 and Oxycodone 5/325mg  one tablet twice a day #60. Continue Voltaren Gel/ Flector Patch We will continue the opioid monitoring program, this consists of regular clinic visits, examinations, urine drug screen, pill counts as well as use of [place] Cont[place]. 4. Diabetes with neuropathy. Continue Gabapentin. 11/25/2017 Keep blood sugars under tight control. Continue to monitor.  5. Cervical Radiculopathy/ Thoracic/ LumbarRadiculopathy:Continue Gabapentin.11/25/2017 6. Greater Right Trochanter Bursitis:Continue Ice/Heat and Current medication regimen. 11/25/2017 7. Chronic Pain Syndrome/ Myofascial Pain: Continue Flector Patch/ Alternates with Voltaren Gel. 11/25/2017  20 minutes of face to face patient care time was spent during this visit. All questions were encouraged and answered.  F/U in 1 month

## 2017-11-25 NOTE — Patient Instructions (Signed)
Tapentadol extended-release tablets What is this medicine? TAPENTADOL (ta PEN ta dol) is a pain reliever. It is used to treat moderate to severe pain that lasts for more than a few days. It is also used to treat nerve pain caused by diabetes. This medicine may be used for other purposes; ask your health care provider or pharmacist if you have questions. COMMON BRAND NAME(S): Nucynta ER What should I tell my health care provider before I take this medicine? They need to know if you have any of these conditions: -Addison's disease -gallbladder disease -head injury -history of a drug or alcohol abuse problem -if you often drink alcohol -kidney disease -liver disease -low blood pressure -lung or breathing disease, like asthma -mental illness -prostate disease -seizures -stomach or intestine problems -thyroid disease -an unusual or allergic reaction to tapentadol, other medicines, foods, dyes, or preservatives -pregnant or trying to get pregnant -breast-feeding How should I use this medicine? Take this medicine by mouth with a glass of water. Do not cut, crush, or chew this medicine. Do not take a tablet that is not whole. A broken or crushed tablet can be very dangerous. You may get too much medicine. Swallow only one tablet at a time. Do not wet, soak, or lick the tablet before you take it. You can take it with or without food. If it upsets your stomach, take it with food. Follow the directions on the prescription label. Take your medicine at regular intervals. Do not take it more often than directed. Do not stop taking except on your doctor's advice. A special MedGuide will be given to you by the pharmacist with each prescription and refill. Be sure to read this information carefully each time. Talk to your pediatrician regarding the use of this medicine in children. Special care may be needed. Overdosage: If you think you have taken too much of this medicine contact a poison control center  or emergency room at once. NOTE: This medicine is only for you. Do not share this medicine with others. What if I miss a dose? If you miss a dose, take it as soon as you can. If it is almost time for your next dose, take only that dose. Do not take double or extra doses. What may interact with this medicine? Do not take this medicine with any of the following medications: -MAOIs like Carbex, Eldepryl, Marplan, Nardil, and Parnate This medicine may also interact with the following medications: -alcohol or any product that contains alcohol -antihistamines for allergy, cough and cold -atropine -certain medicines for anxiety or sleep -certain medicines for bladder problems like oxybutynin, tolterodine -certain medicines for depression like amitriptyline, fluoxetine, sertraline -certain medicines for migraine headache like almotriptan, eletriptan, frovatriptan, naratriptan, rizatriptan, sumatriptan, zolmitriptan -certain medicines for Parkinson's disease like benztropine, trihexyphenidyl -certain medicines for seizures like phenobarbital, primidone -certain medicines for stomach problems like dicyclomine, hyoscyamine -certain medicines for travel sickness like scopolamine -general anesthetics like halothane, isoflurane, methoxyflurane, propofol -ipratropium -local anesthetics like lidocaine, pramoxine, tetracaine -medicines that relax muscles for surgery -other narcotic medicines for pain or cough -phenothiazines like chlorpromazine, mesoridazine, prochlorperazine, thioridazine This list may not describe all possible interactions. Give your health care provider a list of all the medicines, herbs, non-prescription drugs, or dietary supplements you use. Also tell them if you smoke, drink alcohol, or use illegal drugs. Some items may interact with your medicine. What should I watch for while using this medicine? Tell your doctor or health care professional if your pain does not go   away, if it gets  worse, or if you have new or a different type of pain. You may develop tolerance to the medicine. Tolerance means that you will need a higher dose of the medicine for pain relief. Tolerance is normal and is expected if you take the medicine for a long time. Do not suddenly stop taking your medicine because you may develop a severe reaction. Your body becomes used to the medicine. This does NOT mean you are addicted. Addiction is a behavior related to getting and using a drug for a non-medical reason. If you have pain, you have a medical reason to take pain medicine. Your doctor will tell you how much medicine to take. If your doctor wants you to stop the medicine, the dose will be slowly lowered over time to avoid any side effects. There are different types of narcotic medicines (opiates). If you take more than one type at the same time or if you are taking another medicine that also causes drowsiness, you may have more side effects. Give your health care provider a list of all medicines you use. Your doctor will tell you how much medicine to take. Do not take more medicine than directed. Call emergency for help if you have problems breathing or unusual sleepiness. You may get drowsy or dizzy. Do not drive, use machinery, or do anything that needs mental alertness until you know how this medicine affects you. Do not stand or sit up quickly, especially if you are an older patient. This reduces the risk of dizzy or fainting spells. Alcohol may interfere with the effect of this medicine. Avoid alcoholic drinks. This medicine will cause constipation. Try to have a bowel movement at least every 2 to 3 days. If you do not have a bowel movement for 3 days, call your doctor or health care professional. Your mouth may get dry. Chewing sugarless gum or sucking hand candy, and drinking plenty of water may help. Contact your doctor if the problem does not go away or is severe. What side effects may I notice from receiving  this medicine? Side effects that you should report to your doctor or health care professional as soon as possible: -allergic reactions like skin rash, itching or hives, swelling of the face, lips, or tongue -breathing problems -confusion -seizures -signs and symptoms of low blood pressure like dizziness; feeling faint or lightheaded, falls; unusually weak or tired -trouble passing urine or change in the amount of urine Side effects that usually do not require medical attention (report to your doctor or health care professional if they continue or are bothersome): -constipation -dry mouth -nausea, vomiting -tiredness This list may not describe all possible side effects. Call your doctor for medical advice about side effects. You may report side effects to FDA at 1-800-FDA-1088. Where should I keep my medicine? Keep out of the reach of children. This medicine can be abused. Keep your medicine in a safe place to protect it from theft. Do not share this medicine with anyone. Selling or giving away this medicine is dangerous and against the law. Store at room temperature between 15 and 30 degrees C (59 and 86 degrees F). Protect from moisture. This medicine may cause accidental overdose and death if it is taken by other adults, children, or pets. Flush any unused medicine down the toilet to reduce the chance of harm. Do not use the medicine after the expiration date. NOTE: This sheet is a summary. It may not cover all possible information. If   you have questions about this medicine, talk to your doctor, pharmacist, or health care provider.  2018 [employer]vier/Gold Standard (2015-11-21 11:34:44)  

## 2017-11-29 LAB — DRUG TOX MONITOR 1 W/CONF, ORAL FLD
Alprazolam: NEGATIVE ng/mL (ref <0.50)
Amphetamines: NEGATIVE ng/mL (ref <10)
Barbiturates: NEGATIVE ng/mL (ref <10)
Benzodiazepines: POSITIVE ng/mL — AB (ref <0.50)
Buprenorphine: NEGATIVE ng/mL (ref <0.10)
COCAINE: NEGATIVE ng/mL (ref <5.0)
Chlordiazepoxide: NEGATIVE ng/mL (ref <0.50)
Clonazepam: NEGATIVE ng/mL (ref <0.50)
Codeine: NEGATIVE ng/mL (ref <2.5)
DIAZEPAM: 4.03 ng/mL — AB (ref <0.50)
Dihydrocodeine: NEGATIVE ng/mL (ref <2.5)
FENTANYL: NEGATIVE ng/mL (ref <0.10)
FLUNITRAZEPAM: NEGATIVE ng/mL (ref <0.50)
FLURAZEPAM: NEGATIVE ng/mL (ref <0.50)
HYDROCODONE: NEGATIVE ng/mL (ref <2.5)
Heroin Metabolite: NEGATIVE ng/mL (ref <1.0)
Hydromorphone: NEGATIVE ng/mL (ref <2.5)
Lorazepam: NEGATIVE ng/mL (ref <0.50)
MARIJUANA: NEGATIVE ng/mL (ref <2.5)
MDMA: NEGATIVE ng/mL (ref <10)
METHADONE: NEGATIVE ng/mL (ref <5.0)
Meprobamate: NEGATIVE ng/mL (ref <2.5)
Midazolam: NEGATIVE ng/mL (ref <0.50)
Morphine: NEGATIVE ng/mL (ref <2.5)
NICOTINE METABOLITE: NEGATIVE ng/mL (ref <5.0)
NORHYDROCODONE: NEGATIVE ng/mL (ref <2.5)
Nordiazepam: 7.09 ng/mL — ABNORMAL HIGH (ref <0.50)
Noroxycodone: 78.2 ng/mL — ABNORMAL HIGH (ref <2.5)
OPIATES: POSITIVE ng/mL — AB (ref <2.5)
OXYMORPHONE: 5.8 ng/mL — AB (ref <2.5)
Oxazepam: NEGATIVE ng/mL (ref <0.50)
PHENCYCLIDINE: NEGATIVE ng/mL (ref <10)
TRIAZOLAM: NEGATIVE ng/mL (ref <0.50)
Tapentadol: NEGATIVE ng/mL (ref <5.0)
Temazepam: NEGATIVE ng/mL (ref <0.50)
Tramadol: NEGATIVE ng/mL (ref <5.0)
ZOLPIDEM: NEGATIVE ng/mL (ref <5.0)

## 2017-11-29 LAB — DRUG TOX ALC METAB W/CON, ORAL FLD: Alcohol Metabolite: NEGATIVE ng/mL (ref <25)

## 2017-12-01 ENCOUNTER — Telehealth: Payer: Self-pay | Admitting: *Deleted

## 2017-12-01 NOTE — Telephone Encounter (Signed)
Oral swab drug screen was consistent for prescribed medications.  ?

## 2017-12-23 ENCOUNTER — Encounter: Payer: Self-pay | Admitting: Registered Nurse

## 2017-12-23 ENCOUNTER — Encounter: Payer: BLUE CROSS/BLUE SHIELD | Attending: Physical Medicine & Rehabilitation | Admitting: Registered Nurse

## 2017-12-23 VITALS — BP 107/70 | HR 108

## 2017-12-23 DIAGNOSIS — G8929 Other chronic pain: Secondary | ICD-10-CM | POA: Diagnosis not present

## 2017-12-23 DIAGNOSIS — Z79899 Other long term (current) drug therapy: Secondary | ICD-10-CM

## 2017-12-23 DIAGNOSIS — M7918 Myalgia, other site: Secondary | ICD-10-CM | POA: Diagnosis not present

## 2017-12-23 DIAGNOSIS — M5134 Other intervertebral disc degeneration, thoracic region: Secondary | ICD-10-CM | POA: Insufficient documentation

## 2017-12-23 DIAGNOSIS — M5430 Sciatica, unspecified side: Secondary | ICD-10-CM | POA: Insufficient documentation

## 2017-12-23 DIAGNOSIS — M961 Postlaminectomy syndrome, not elsewhere classified: Secondary | ICD-10-CM

## 2017-12-23 DIAGNOSIS — M5412 Radiculopathy, cervical region: Secondary | ICD-10-CM

## 2017-12-23 DIAGNOSIS — M546 Pain in thoracic spine: Secondary | ICD-10-CM | POA: Diagnosis not present

## 2017-12-23 DIAGNOSIS — M47817 Spondylosis without myelopathy or radiculopathy, lumbosacral region: Secondary | ICD-10-CM

## 2017-12-23 DIAGNOSIS — M542 Cervicalgia: Secondary | ICD-10-CM | POA: Diagnosis not present

## 2017-12-23 DIAGNOSIS — M5414 Radiculopathy, thoracic region: Secondary | ICD-10-CM | POA: Diagnosis not present

## 2017-12-23 DIAGNOSIS — M5416 Radiculopathy, lumbar region: Secondary | ICD-10-CM | POA: Diagnosis not present

## 2017-12-23 DIAGNOSIS — E119 Type 2 diabetes mellitus without complications: Secondary | ICD-10-CM | POA: Diagnosis not present

## 2017-12-23 DIAGNOSIS — M5135 Other intervertebral disc degeneration, thoracolumbar region: Secondary | ICD-10-CM | POA: Insufficient documentation

## 2017-12-23 DIAGNOSIS — G609 Hereditary and idiopathic neuropathy, unspecified: Secondary | ICD-10-CM

## 2017-12-23 DIAGNOSIS — G894 Chronic pain syndrome: Secondary | ICD-10-CM

## 2017-12-23 DIAGNOSIS — M47896 Other spondylosis, lumbar region: Secondary | ICD-10-CM | POA: Diagnosis not present

## 2017-12-23 DIAGNOSIS — Z5181 Encounter for therapeutic drug level monitoring: Secondary | ICD-10-CM | POA: Diagnosis not present

## 2017-12-23 DIAGNOSIS — Z760 Encounter for issue of repeat prescription: Secondary | ICD-10-CM | POA: Insufficient documentation

## 2017-12-23 DIAGNOSIS — M79669 Pain in unspecified lower leg: Secondary | ICD-10-CM | POA: Insufficient documentation

## 2017-12-23 DIAGNOSIS — M179 Osteoarthritis of knee, unspecified: Secondary | ICD-10-CM | POA: Diagnosis not present

## 2017-12-23 DIAGNOSIS — M5489 Other dorsalgia: Secondary | ICD-10-CM | POA: Insufficient documentation

## 2017-12-23 MED ORDER — OXYCODONE HCL ER 20 MG PO T12A: 20.0000 mg | EXTENDED_RELEASE_TABLET | Freq: Two times a day (BID) | ORAL | 0 refills | Status: DC

## 2017-12-23 MED ORDER — OXYCODONE-ACETAMINOPHEN 5-325 MG PO TABS: 1.0000 | ORAL_TABLET | Freq: Two times a day (BID) | ORAL | 0 refills | Status: DC

## 2017-12-23 NOTE — Progress Notes (Signed)
Subjective:    Patient ID: David [name], male    DOB: [birth year], 42 y.o.   MRN: [id number]  HPI: David [name] is a 42year old male who returns for follow upappointmentfor chronic pain and medication refill. He states his pain is located in his neck radiating into his right shoulder also has left shoulder pain, mid-lower back pain radiating into his left lower extremity. He rates his pain 6. His current exercise regime is walking and performing stretching exercises.   David [name] Morphine equivalent is 75.00 MME. He is also prescribed Diazepan  by Mauricio Po NP.We have reviewed the black box warning again regarding using opioids and benzodiazepines. I highlighted the dangers of using these drugs together and discussed the adverse events including respiratory suppression, overdose, cognitive impairment and importance of  compliance with current regimen. He verbalizes understanding, we will continue to monitor and adjust as indicated.    Oral Swab was Performed  on 11/25/2017, it was consistent.    Pain Inventory Average Pain 5 Pain Right Now 6 My pain is intermittent, constant, sharp, burning, dull, stabbing, tingling and aching  In the last 24 hours, has pain interfered with the following? General activity 6 Relation with others 6 Enjoyment of life 6 What TIME of day is your pain at its worst? all Sleep (in general) Fair  Pain is worse with: walking, bending, sitting, inactivity, standing and some activites Pain improves with: rest, heat/ice, therapy/exercise, pacing activities and medication Relief from Meds: 4  Mobility walk without assistance how many minutes can you walk? 45 ability to climb steps?  yes do you drive?  yes Do you have any goals in this area?  yes  Function not employed: date last employed 2016 Do you have any goals in this area?  yes  Neuro/Psych numbness tingling spasms depression anxiety  Prior Studies Any changes since last visit?   no  Physicians involved in your care Any changes since last visit?  no   Family History  Problem Relation Age of Onset  . Arthritis Mother   . Heart disease Mother   . Diabetes Mother   . Heart disease Father   . Diabetes Father    Social History   Socioeconomic History  . Marital status: Married    Spouse name: Not on file  . Number of children: Not on file  . Years of education: Not on file  . Highest education level: Not on file  Social Needs  . Financial resource strain: Not on file  . Food insecurity - worry: Not on file  . Food insecurity - inability: Not on file  . Transportation needs - medical: Not on file  . Transportation needs - non-medical: Not on file  Occupational History  . Not on file  Tobacco Use  . Smoking status: Never Smoker  . Smokeless tobacco: Never Used  Substance and Sexual Activity  . Alcohol use: No  . Drug use: No  . Sexual activity: Not on file  Other Topics Concern  . Not on file  Social History Narrative  . Not on file   No past surgical history on file. Past Medical History:  Diagnosis Date  . Chronic pain syndrome   . 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 Lumbago   . Pain in joint, lower leg   . Postlaminectomy syndrome, cervical region   . Primary localized osteoarthrosis, lower leg   . Sciatica   . Thoracic radiculopathy  There were no vitals taken for this visit.  Opioid Risk Score:   Fall Risk Score:  `1  Depression screen PHQ 2/9  [facility] 2/9 05/27/2017 02/23/2017 12/29/2016 01/02/2016 06/13/2015 01/11/2015  Decreased Interest 1 1 1 1  0 0  Down, Depressed, Hopeless 1 1 1 1 1 1   PHQ - 2 Score 2 2 2 2 1 1   Altered sleeping - - - 2 - 1  Tired, decreased energy - - - 2 - 1  Change in appetite - - - 0 - 0  Feeling bad or failure about yourself  - - - 0 - 0  Trouble concentrating - - - 0 - 0  Moving slowly or fidgety/restless - - - 0 - 0  Suicidal thoughts - - - 0 - 0  PHQ-9  Score - - - 6 - 3  Difficult doing work/chores - - - Somewhat difficult - -    Review of Systems  Constitutional: Negative.   HENT: Negative.   Eyes: Negative.   Respiratory: Negative.   Cardiovascular: Negative.   Gastrointestinal: Negative.   Endocrine: Negative.   Genitourinary: Negative.   Musculoskeletal: Negative.        Spasms   Skin: Negative.   Allergic/Immunologic: Negative.   Neurological: Positive for numbness.       Tingling  Hematological: Negative.   Psychiatric/Behavioral: Positive for dysphoric mood. The patient is nervous/anxious.   All other systems reviewed and are negative.      Objective:   Physical Exam  Constitutional: He is oriented to person, place, and time. He appears well-developed and well-nourished.  HENT:  Head: Normocephalic and atraumatic.  Neck: Normal range of motion. Neck supple.  Cervical Paraspinal Tenderness: C-5-C-6 Mainly Right Side  Cardiovascular: Normal rate and regular rhythm.  Pulmonary/Chest: Effort normal and breath sounds normal.  Musculoskeletal:  Normal Muscle Bulk and Muscle Testing Reveals: Upper Extremities: Full ROM and Muscle Strength 5/5 Bilateral AC Joint Tenderness Thoracic Paraspinal Tenderness: T-1-T-3 T-7-T-9 Lumbar Paraspinal Tenderness: L-3-L-5 Lower Extremities: Full ROM and Muscle Strength 5/5 Arises from Table with ease Narrow Based gait   Neurological: He is alert and oriented to person, place, and time.  Skin: Skin is warm and dry.  Psychiatric: He has a normal mood and affect.  Nursing note and vitals reviewed.         Assessment & Plan:  1. Cervical postlaminectomy syndrome, Hx of ACDF C4-5, C6-7, C7-T1, 02/2008. Continue current medication and alternate using heat and ice therapy. 12/23/2017 2. Spondylosis of L-spine . Continue current treatment regime. 12/23/2017 3. Chronic postoperative pain: 12/23/2017 Refilled: Oxycontin 20 mg one tablet every 12 hours#60 and Oxycodone 5/325mg  one  tablet twice a day #60. Continue Voltaren Gel/ Flector Patch We will continue the opioid monitoring program, this consists of regular clinic visits, examinations, urine drug screen, pill counts as well as use of [place]. 4. Diabetes with neuropathy. Continue Gabapentin. 12/23/2017 Keep blood sugars under tight control. Continue to monitor.  5. Cervical Radiculopathy/ Thoracic/ LumbarRadiculopathy:Continue Gabapentin.12/23/2017 6. Greater Right Trochanter Bursitis:No complaints today. Continue Ice/Heat and Current medication regimen. 12/23/2017 7. Chronic Pain Syndrome/ Myofascial Pain: Continue Flector Patch/ Alternates with Voltaren Gel. 12/23/2017  20 minutes of face to face patient care time was spent during this visit. All questions were encouraged and answered.  F/U in 1 month

## 2018-01-20 ENCOUNTER — Encounter: Payer: BLUE CROSS/BLUE SHIELD | Attending: Physical Medicine & Rehabilitation | Admitting: Registered Nurse

## 2018-01-20 ENCOUNTER — Other Ambulatory Visit: Payer: Self-pay

## 2018-01-20 ENCOUNTER — Encounter: Payer: Self-pay | Admitting: Registered Nurse

## 2018-01-20 VITALS — BP 129/83 | HR 105

## 2018-01-20 DIAGNOSIS — Z79899 Other long term (current) drug therapy: Secondary | ICD-10-CM

## 2018-01-20 DIAGNOSIS — M47817 Spondylosis without myelopathy or radiculopathy, lumbosacral region: Secondary | ICD-10-CM | POA: Diagnosis not present

## 2018-01-20 DIAGNOSIS — M5135 Other intervertebral disc degeneration, thoracolumbar region: Secondary | ICD-10-CM | POA: Insufficient documentation

## 2018-01-20 DIAGNOSIS — M5134 Other intervertebral disc degeneration, thoracic region: Secondary | ICD-10-CM | POA: Insufficient documentation

## 2018-01-20 DIAGNOSIS — M961 Postlaminectomy syndrome, not elsewhere classified: Secondary | ICD-10-CM | POA: Insufficient documentation

## 2018-01-20 DIAGNOSIS — M5430 Sciatica, unspecified side: Secondary | ICD-10-CM | POA: Diagnosis not present

## 2018-01-20 DIAGNOSIS — M5412 Radiculopathy, cervical region: Secondary | ICD-10-CM

## 2018-01-20 DIAGNOSIS — M5489 Other dorsalgia: Secondary | ICD-10-CM | POA: Insufficient documentation

## 2018-01-20 DIAGNOSIS — M47896 Other spondylosis, lumbar region: Secondary | ICD-10-CM | POA: Diagnosis not present

## 2018-01-20 DIAGNOSIS — G609 Hereditary and idiopathic neuropathy, unspecified: Secondary | ICD-10-CM | POA: Diagnosis not present

## 2018-01-20 DIAGNOSIS — G894 Chronic pain syndrome: Secondary | ICD-10-CM

## 2018-01-20 DIAGNOSIS — G8929 Other chronic pain: Secondary | ICD-10-CM | POA: Diagnosis not present

## 2018-01-20 DIAGNOSIS — M179 Osteoarthritis of knee, unspecified: Secondary | ICD-10-CM | POA: Insufficient documentation

## 2018-01-20 DIAGNOSIS — M5416 Radiculopathy, lumbar region: Secondary | ICD-10-CM

## 2018-01-20 DIAGNOSIS — E119 Type 2 diabetes mellitus without complications: Secondary | ICD-10-CM | POA: Diagnosis not present

## 2018-01-20 DIAGNOSIS — M546 Pain in thoracic spine: Secondary | ICD-10-CM | POA: Diagnosis not present

## 2018-01-20 DIAGNOSIS — Z760 Encounter for issue of repeat prescription: Secondary | ICD-10-CM | POA: Diagnosis present

## 2018-01-20 DIAGNOSIS — M7581 Other shoulder lesions, right shoulder: Secondary | ICD-10-CM

## 2018-01-20 DIAGNOSIS — M7918 Myalgia, other site: Secondary | ICD-10-CM | POA: Diagnosis not present

## 2018-01-20 DIAGNOSIS — M5414 Radiculopathy, thoracic region: Secondary | ICD-10-CM | POA: Insufficient documentation

## 2018-01-20 DIAGNOSIS — M79669 Pain in unspecified lower leg: Secondary | ICD-10-CM | POA: Diagnosis not present

## 2018-01-20 DIAGNOSIS — Z5181 Encounter for therapeutic drug level monitoring: Secondary | ICD-10-CM

## 2018-01-20 DIAGNOSIS — M778 Other enthesopathies, not elsewhere classified: Secondary | ICD-10-CM

## 2018-01-20 DIAGNOSIS — M542 Cervicalgia: Secondary | ICD-10-CM

## 2018-01-20 MED ORDER — OXYCODONE HCL ER 20 MG PO T12A: 20.0000 mg | EXTENDED_RELEASE_TABLET | Freq: Two times a day (BID) | ORAL | 0 refills | Status: DC

## 2018-01-20 MED ORDER — OXYCODONE-ACETAMINOPHEN 5-325 MG PO TABS: 1.0000 | ORAL_TABLET | Freq: Two times a day (BID) | ORAL | 0 refills | Status: DC

## 2018-01-20 NOTE — Progress Notes (Signed)
Subjective:    Patient ID: David [name], male    DOB: 08-14-1976, 42 y.o.   MRN: [id number]  HPI: David [name] is a [age]year old male who returns for follow upappointmentfor chronic pain and medication refill. He states his pain is located in his neck radiating into his bilateral shoulder R>L, mid-lower back radiating into his bilateral lower extremities  Left> right and right hip pain. He rates his pain 7. His current exercise regime is walking.  David [name] brought in a letter from [facility] they will not cover Oxycontin after 02/07/2018, we will review formulary. We will prescribe Xtampza, this was discussed with Dr. Wynn BankerKirsteins he agrees with plan.  He verbalizes understanding.  David [name]. He is also prescribed Diazepan  by David [name].We have reviewed the black box warning again regarding using opioids and benzodiazepines. I highlighted the dangers of using these drugs together and discussed the adverse events including respiratory suppression, overdose, cognitive impairment and importance of  compliance with current regimen. He verbalizes understanding, we will continue to monitor and adjust as indicated.    Oral Swab was Performed  on 11/25/2017, it was consistent.    Pain Inventory Average Pain 5 Pain Right Now 7 My pain is intermittent, constant, sharp, burning, dull, stabbing, tingling and aching  In the last 24 hours, has pain interfered with the following? General activity 6 Relation with others 5 Enjoyment of life 5 What TIME of day is your pain at its worst? all Sleep (in general) Fair  Pain is worse with: walking, bending, sitting, inactivity, standing and some activites Pain improves with: rest, heat/ice, therapy/exercise, pacing activities and medication Relief from Meds: 5  Mobility walk without assistance how many minutes can you walk? 45 ability to climb steps?  yes do you drive?  yes Do you have any  goals in this area?  yes  Function not employed: date last employed 2016 Do you have any goals in this area?  yes  Neuro/Psych numbness tingling spasms depression anxiety  Prior Studies Any changes since last visit?  no  Physicians involved in your care Any changes since last visit?  no   Family History  Problem Relation Age of Onset  . Arthritis Mother   . Heart disease Mother   . Diabetes Mother   . Heart disease Father   . Diabetes Father    Social History   Socioeconomic History  . Marital status: Married    Spouse name: Not on file  . Number of children: Not on file  . Years of education: Not on file  . Highest education level: Not on file  Occupational History  . Not on file  Social Needs  . Financial resource strain: Not on file  . Food insecurity:    Worry: Not on file    Inability: Not on file  . Transportation needs:    Medical: Not on file    Non-medical: Not on file  Tobacco Use  . Smoking status: Never Smoker  . Smokeless tobacco: Never Used  Substance and Sexual Activity  . Alcohol use: No  . Drug use: No  . Sexual activity: Not on file  Lifestyle  . Physical activity:    Days per week: Not on file    Minutes per session: Not on file  . Stress: Not on file  Relationships  . Social connections:    Talks on phone: Not on file  Gets together: Not on file    Attends religious service: Not on file    Active member of club or organization: Not on file    Attends meetings of clubs or organizations: Not on file    Relationship status: Not on file  Other Topics Concern  . Not on file  Social History Narrative  . Not on file   No past surgical history on file. Past Medical History:  Diagnosis Date  . Chronic pain syndrome   . 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 Lumbago   . Pain in joint, lower leg   . Postlaminectomy syndrome, cervical region   . Primary localized osteoarthrosis, lower leg   . Sciatica    . Thoracic radiculopathy    BP 129/83   Pulse (!) 105   SpO2 97%   Opioid Risk Score:   Fall Risk Score:  `1  Depression screen PHQ 2/9  Depression screen [facility] 2/9 05/27/2017 02/23/2017 12/29/2016 01/02/2016 06/13/2015 01/11/2015  Decreased Interest 1 1 1 1  0 0  Down, Depressed, Hopeless 1 1 1 1 1 1   PHQ - 2 Score 2 2 2 2 1 1   Altered sleeping - - - 2 - 1  Tired, decreased energy - - - 2 - 1  Change in appetite - - - 0 - 0  Feeling bad or failure about yourself  - - - 0 - 0  Trouble concentrating - - - 0 - 0  Moving slowly or fidgety/restless - - - 0 - 0  Suicidal thoughts - - - 0 - 0  PHQ-9 Score - - - 6 - 3  Difficult doing work/chores - - - Somewhat difficult - -    Review of Systems  Constitutional: Negative.   HENT: Negative.   Eyes: Negative.   Respiratory: Negative.   Cardiovascular: Negative.   Gastrointestinal: Negative.   Endocrine: Negative.   Genitourinary: Negative.   Musculoskeletal: Negative.        Spasms   Skin: Negative.   Allergic/Immunologic: Negative.   Neurological: Positive for numbness.       Tingling  Hematological: Negative.   Psychiatric/Behavioral: Positive for dysphoric mood. The patient is nervous/anxious.   All other systems reviewed and are negative.      Objective:   Physical Exam  Constitutional: He appears well-developed and well-nourished.  HENT:  Head: Normocephalic and atraumatic.  Neck: Normal range of motion. Neck supple.  Cervical Paraspinal Tenderness: C-5-C-6 Mainly Right Side  Cardiovascular: Normal rate and regular rhythm.  Pulmonary/Chest: Effort normal and breath sounds normal.  Musculoskeletal:  Normal Muscle Bulk and Muscle Testing Reveals: Upper Extremities: Full ROM and Muscle Strength 5/5 Bilateral AC Joint Tenderness Thoracic and Lumbar Hypersensitivity Right Greater Trochanter Tenderness Lower Extremities: Full ROM and Muscle Strength 5/5 Arises from Table with ease Narrow Based gait   Neurological: He  is alert.  Skin: Skin is warm and dry.  Psychiatric: He has a normal mood and affect.  Nursing note and vitals reviewed.         Assessment & Plan:  1. Cervical postlaminectomy syndrome, Hx of ACDF C4-5, C6-7, C7-T1, 02/2008. Continue current medication and alternate using heat and ice therapy. 01/20/2018 2. Spondylosis of L-spine . Continue current treatment regime. 01/20/2018 3. Chronic postoperative pain: 01/20/2018 Refilled: Oxycontin 20 mg one tablet every 12 hours#60 and Oxycodone 5/325mg  one tablet twice a day #60. Continue Voltaren Gel/ Flector Patch We will continue the opioid monitoring program, this consists of regular  clinic visits, examinations, urine drug screen, pill counts as well as use of [place] Controlled Substance Reporting System. 4. Diabetes with neuropathy. Continue Gabapentin. 01/20/2018 Keep blood sugars under tight control. Continue to monitor.  5. Cervical Radiculopathy/ Thoracic/ LumbarRadiculopathy:Continue Gabapentin.01/20/2018 6. Greater Right Trochanter Bursitis: Continue Ice/Heat and Current medication regimen. 01/20/2018 7. Chronic Pain Syndrome/ Myofascial Pain: Continue Flector Patch/ Alternates with Voltaren Gel. 01/20/2018  20 minutes of face to face patient care time was spent during this visit. All questions were encouraged and answered.  F/U in 1 month

## 2018-02-10 ENCOUNTER — Telehealth: Payer: Self-pay

## 2018-02-10 NOTE — Telephone Encounter (Signed)
[name] called today, stated that he recieved his pain medication for the month, but will be unable to receive his ER mediction next month as it is currently prescribed. And will need to be changed to a different medication.  Initiated a prior authorization for Marlowe KaysXtampza which is currently listed on his insurance formulary.

## 2018-02-17 ENCOUNTER — Encounter: Payer: Self-pay | Admitting: Registered Nurse

## 2018-02-17 ENCOUNTER — Encounter: Payer: BLUE CROSS/BLUE SHIELD | Attending: Physical Medicine & Rehabilitation | Admitting: Registered Nurse

## 2018-02-17 VITALS — BP 112/70 | HR 112 | Ht 75.0 in | Wt 224.0 lb

## 2018-02-17 DIAGNOSIS — G894 Chronic pain syndrome: Secondary | ICD-10-CM

## 2018-02-17 DIAGNOSIS — E119 Type 2 diabetes mellitus without complications: Secondary | ICD-10-CM | POA: Insufficient documentation

## 2018-02-17 DIAGNOSIS — Z760 Encounter for issue of repeat prescription: Secondary | ICD-10-CM | POA: Insufficient documentation

## 2018-02-17 DIAGNOSIS — M5414 Radiculopathy, thoracic region: Secondary | ICD-10-CM | POA: Diagnosis not present

## 2018-02-17 DIAGNOSIS — M5135 Other intervertebral disc degeneration, thoracolumbar region: Secondary | ICD-10-CM | POA: Insufficient documentation

## 2018-02-17 DIAGNOSIS — Z5181 Encounter for therapeutic drug level monitoring: Secondary | ICD-10-CM | POA: Diagnosis not present

## 2018-02-17 DIAGNOSIS — M546 Pain in thoracic spine: Secondary | ICD-10-CM

## 2018-02-17 DIAGNOSIS — M7918 Myalgia, other site: Secondary | ICD-10-CM | POA: Diagnosis not present

## 2018-02-17 DIAGNOSIS — M5430 Sciatica, unspecified side: Secondary | ICD-10-CM | POA: Insufficient documentation

## 2018-02-17 DIAGNOSIS — M79669 Pain in unspecified lower leg: Secondary | ICD-10-CM | POA: Diagnosis not present

## 2018-02-17 DIAGNOSIS — M5416 Radiculopathy, lumbar region: Secondary | ICD-10-CM

## 2018-02-17 DIAGNOSIS — G609 Hereditary and idiopathic neuropathy, unspecified: Secondary | ICD-10-CM

## 2018-02-17 DIAGNOSIS — M5489 Other dorsalgia: Secondary | ICD-10-CM | POA: Diagnosis not present

## 2018-02-17 DIAGNOSIS — M961 Postlaminectomy syndrome, not elsewhere classified: Secondary | ICD-10-CM | POA: Diagnosis not present

## 2018-02-17 DIAGNOSIS — M47896 Other spondylosis, lumbar region: Secondary | ICD-10-CM | POA: Diagnosis not present

## 2018-02-17 DIAGNOSIS — M5412 Radiculopathy, cervical region: Secondary | ICD-10-CM | POA: Diagnosis not present

## 2018-02-17 DIAGNOSIS — M179 Osteoarthritis of knee, unspecified: Secondary | ICD-10-CM | POA: Insufficient documentation

## 2018-02-17 DIAGNOSIS — M5134 Other intervertebral disc degeneration, thoracic region: Secondary | ICD-10-CM | POA: Diagnosis not present

## 2018-02-17 DIAGNOSIS — M47817 Spondylosis without myelopathy or radiculopathy, lumbosacral region: Secondary | ICD-10-CM

## 2018-02-17 DIAGNOSIS — M542 Cervicalgia: Secondary | ICD-10-CM

## 2018-02-17 DIAGNOSIS — G8929 Other chronic pain: Secondary | ICD-10-CM | POA: Diagnosis not present

## 2018-02-17 MED ORDER — OXYCODONE ER 18 MG PO C12A: 1.0000 | EXTENDED_RELEASE_CAPSULE | Freq: Two times a day (BID) | ORAL | 0 refills | Status: DC

## 2018-02-17 MED ORDER — OXYCODONE-ACETAMINOPHEN 5-325 MG PO TABS: 1.0000 | ORAL_TABLET | Freq: Two times a day (BID) | ORAL | 0 refills | Status: DC

## 2018-02-17 NOTE — Progress Notes (Signed)
Subjective:    Patient ID: David [name], male    DOB: 05/26/1976, 42 y.o.   MRN: [id number]  HPI: [name] is a 42 year old male who returns for follow up appointment for chronic pain and medication refill. He states his pain is located in his neck radiating into his head he reports, left shoulder radiating into his left arm with tingling and numbness, mid-lower back radiating into his lower extremities. [name] reports increase intensity and frequency of neuropathic pain, at this time he doesn't want to increase gabapentin due to daytime drowsiness. He rates his pain 6. His current exercise regime is walking.   [name] insurance no longer covers Oxycontin, we will prescribe Xtampza today, the above has been discussed with Dr. Wynn Banker and he agrees with plan.  [name] David [name] Equivalent is 82.50 MME. He is also prescribed Diazepam by Mauricio Po, last prescription picked up on 12/23/2017 .We have discussed the black box warning of using opioids and benzodiazepines.  I highlighted the dangers of using these drugs together and discussed the adverse events including respiratory suppression, overdose, cognitive impairment and importance of  compliance with current regimen. He verbalizes understanding, we will continue to monitor and adjust as indicated.    Oral Swab was Performed on 11/25/2017, it was consistent.   Pain Inventory Average Pain 5 Pain Right Now 6 My pain is constant, sharp, burning, dull, stabbing, tingling and aching  In the last 24 hours, has pain interfered with the following? General activity 6 Relation with others 5 Enjoyment of life 6 What TIME of day is your pain at its worst? all Sleep (in general) Fair  Pain is worse with: walking, bending, sitting, inactivity, standing and some activites Pain improves with: rest, heat/ice, therapy/exercise, pacing activities and medication Relief from Meds: 5  Mobility walk without assistance ability to  climb steps?  yes do you drive?  yes  Function not employed: date last employed .  Neuro/Psych numbness tingling spasms depression anxiety  Prior Studies Any changes since last visit?  no  Physicians involved in your care Any changes since last visit?  no   Family History  Problem Relation Age of Onset  . Arthritis Mother   . Heart disease Mother   . Diabetes Mother   . Heart disease Father   . Diabetes Father    Social History   Socioeconomic History  . Marital status: Married    Spouse name: Not on file  . Number of children: Not on file  . Years of education: Not on file  . Highest education level: Not on file  Occupational History  . Not on file  Social Needs  . Financial resource strain: Not on file  . Food insecurity:    Worry: Not on file    Inability: Not on file  . Transportation needs:    Medical: Not on file    Non-medical: Not on file  Tobacco Use  . Smoking status: Never Smoker  . Smokeless tobacco: Never Used  Substance and Sexual Activity  . Alcohol use: No  . Drug use: No  . Sexual activity: Not on file  Lifestyle  . Physical activity:    Days per week: Not on file    Minutes per session: Not on file  . Stress: Not on file  Relationships  . Social connections:    Talks on phone: Not on file    Gets together: Not on file    Attends  religious service: Not on file    Active member of club or organization: Not on file    Attends meetings of clubs or organizations: Not on file    Relationship status: Not on file  Other Topics Concern  . Not on file  Social History Narrative  . Not on file   No past surgical history on file. Past Medical History:  Diagnosis Date  . Chronic pain syndrome   . 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 Lumbago   . Pain in joint, lower leg   . Postlaminectomy syndrome, cervical region   . Primary localized osteoarthrosis, lower leg   . Sciatica   . Thoracic radiculopathy     There were no vitals taken for this visit.  Opioid Risk Score:   Fall Risk Score:  `1  Depression screen PHQ 2/9  Depression screen [facility] 2/9 01/20/2018 05/27/2017 02/23/2017 12/29/2016 01/02/2016 06/13/2015 01/11/2015  Decreased Interest 1 1 1 1 1  0 0  Down, Depressed, Hopeless 1 1 1 1 1 1 1   PHQ - 2 Score 2 2 2 2 2 1 1   Altered sleeping - - - - 2 - 1  Tired, decreased energy - - - - 2 - 1  Change in appetite - - - - 0 - 0  Feeling bad or failure about yourself  - - - - 0 - 0  Trouble concentrating - - - - 0 - 0  Moving slowly or fidgety/restless - - - - 0 - 0  Suicidal thoughts - - - - 0 - 0  PHQ-9 Score - - - - 6 - 3  Difficult doing work/chores - - - - Somewhat difficult - -     Review of Systems  Constitutional: Negative.   HENT: Negative.   Eyes: Negative.   Respiratory: Negative.   Cardiovascular: Negative.   Gastrointestinal: Negative.   Endocrine: Negative.   Genitourinary: Negative.   Musculoskeletal: Positive for arthralgias, back pain, myalgias and neck pain.  Skin: Negative.   Allergic/Immunologic: Negative.   Neurological: Negative.   Hematological: Negative.   Psychiatric/Behavioral: Negative.   All other systems reviewed and are negative.      Objective:   Physical Exam  Constitutional: He is oriented to person, place, and time. He appears well-developed and well-nourished.  HENT:  Head: Normocephalic and atraumatic.  Neck: Normal range of motion. Neck supple.  Cardiovascular: Normal rate and regular rhythm.  Pulmonary/Chest: Effort normal and breath sounds normal.  Musculoskeletal:  Normal Muscle Bulk and Muscle Testing Reveals: Upper Extremities: Full ROM and Muscle Strength 5/5 Lumbar Paraspinal Tenderness: L-3-L-5 Right Greater Trochanter Tenderness Lower Extremities: Full ROM and Muscle Strength 5/5 Right Lower Extremity Flexion Produces Pain into Lumbar and Right Hip Arises from Table Slowly Narrow Based Gait  Neurological: He is alert and  oriented to person, place, and time.  Skin: Skin is warm and dry.  Psychiatric: He has a normal mood and affect.  Nursing note and vitals reviewed.         Assessment & Plan:  1. Cervical postlaminectomy syndrome, Hx of ACDF C4-5, C6-7, C7-T1, 02/2008. Continue current medication and alternate using heat and ice therapy. 02/17/2018 2. Spondylosis of L-spine . Continue current treatment regime. 02/17/2018 3. Chronic postoperative pain:  Oxycontin discontinued not covered by insurance. We will prescribe Xtampza 18 mg one capsule every 12 hours for pain #60.  Refilled:Oxycodone 5/325mg  one tablet twice a day #60. Continue Voltaren Gel/ Flector Patch. 02/17/2018. We will  continue the opioid monitoring program, this consists of regular clinic visits, examinations, urine drug screen, pill counts as well as use of [place] Controlled Substance Reporting System. 4. Diabetes with neuropathy. Continue Gabapentin. 02/17/2018 Keep blood sugars under tight control. Continue to monitor. 02/17/2018. 5. Cervicalgia/Cervical Radiculopathy/ Thoracic Radiculopathy/ LumbarRadiculopathy:Continue Gabapentin.02/17/2018 6. Greater Right Trochanter Bursitis: Continue Ice/Heat and Current medication regimen. 01/20/2018 7. Chronic Pain Syndrome/ Myofascial Pain: Continue Flector Patch/ Alternates with Voltaren Gel. 04//18/2019  20 minutes of face to face patient care time was spent during this visit. All questions were encouraged and answered.  F/U in 1 month

## 2018-02-24 NOTE — Telephone Encounter (Signed)
Xtampza denied, appeal letter drafted and sent, awaiting response

## 2018-02-25 ENCOUNTER — Telehealth: Payer: Self-pay | Admitting: Registered Nurse

## 2018-02-25 NOTE — Telephone Encounter (Signed)
Placed a call to David [name] to update David [name] on his PA for [employer]. PA was done still waiting on response from [employer] and [employer]. Purvis SheffieldKen Wessling called [employer] and [employer], they stated the PA still in process. Called [facility], the pharmacist reports David [name] picked up his Oxycontin on 01/29/2018. I left message for [name] regarding the above. We will follow up with [employer] [facility] and [facility] on Monday. He was instructed if he experience any withdrawal symptoms to go to the emergency room for evaluation. Awaiting a return call from Mr. Etzkorn.

## 2018-03-01 NOTE — Telephone Encounter (Signed)
Rocky Link called today to check status of appeal, [employer] stated it has been approved.  He called pharmacy and patient to and informed them of status.

## 2018-03-17 ENCOUNTER — Encounter: Payer: BLUE CROSS/BLUE SHIELD | Attending: Physical Medicine & Rehabilitation | Admitting: Registered Nurse

## 2018-03-17 ENCOUNTER — Encounter: Payer: Self-pay | Admitting: Registered Nurse

## 2018-03-17 ENCOUNTER — Ambulatory Visit: Payer: BLUE CROSS/BLUE SHIELD | Admitting: Registered Nurse

## 2018-03-17 VITALS — BP 111/73 | HR 102 | Ht 74.0 in | Wt 229.0 lb

## 2018-03-17 DIAGNOSIS — M79669 Pain in unspecified lower leg: Secondary | ICD-10-CM | POA: Diagnosis not present

## 2018-03-17 DIAGNOSIS — M47817 Spondylosis without myelopathy or radiculopathy, lumbosacral region: Secondary | ICD-10-CM

## 2018-03-17 DIAGNOSIS — M7061 Trochanteric bursitis, right hip: Secondary | ICD-10-CM | POA: Diagnosis not present

## 2018-03-17 DIAGNOSIS — M542 Cervicalgia: Secondary | ICD-10-CM | POA: Diagnosis not present

## 2018-03-17 DIAGNOSIS — M5134 Other intervertebral disc degeneration, thoracic region: Secondary | ICD-10-CM | POA: Insufficient documentation

## 2018-03-17 DIAGNOSIS — M7918 Myalgia, other site: Secondary | ICD-10-CM | POA: Diagnosis not present

## 2018-03-17 DIAGNOSIS — E119 Type 2 diabetes mellitus without complications: Secondary | ICD-10-CM | POA: Insufficient documentation

## 2018-03-17 DIAGNOSIS — Z5181 Encounter for therapeutic drug level monitoring: Secondary | ICD-10-CM | POA: Diagnosis not present

## 2018-03-17 DIAGNOSIS — G894 Chronic pain syndrome: Secondary | ICD-10-CM | POA: Diagnosis not present

## 2018-03-17 DIAGNOSIS — M5430 Sciatica, unspecified side: Secondary | ICD-10-CM | POA: Diagnosis not present

## 2018-03-17 DIAGNOSIS — G609 Hereditary and idiopathic neuropathy, unspecified: Secondary | ICD-10-CM | POA: Diagnosis not present

## 2018-03-17 DIAGNOSIS — M5416 Radiculopathy, lumbar region: Secondary | ICD-10-CM

## 2018-03-17 DIAGNOSIS — M961 Postlaminectomy syndrome, not elsewhere classified: Secondary | ICD-10-CM | POA: Insufficient documentation

## 2018-03-17 DIAGNOSIS — M47896 Other spondylosis, lumbar region: Secondary | ICD-10-CM | POA: Diagnosis not present

## 2018-03-17 DIAGNOSIS — M179 Osteoarthritis of knee, unspecified: Secondary | ICD-10-CM | POA: Insufficient documentation

## 2018-03-17 DIAGNOSIS — M5135 Other intervertebral disc degeneration, thoracolumbar region: Secondary | ICD-10-CM | POA: Diagnosis not present

## 2018-03-17 DIAGNOSIS — M5414 Radiculopathy, thoracic region: Secondary | ICD-10-CM

## 2018-03-17 DIAGNOSIS — M546 Pain in thoracic spine: Secondary | ICD-10-CM

## 2018-03-17 DIAGNOSIS — G8929 Other chronic pain: Secondary | ICD-10-CM | POA: Insufficient documentation

## 2018-03-17 DIAGNOSIS — M5489 Other dorsalgia: Secondary | ICD-10-CM | POA: Diagnosis not present

## 2018-03-17 DIAGNOSIS — M5412 Radiculopathy, cervical region: Secondary | ICD-10-CM

## 2018-03-17 DIAGNOSIS — M25572 Pain in left ankle and joints of left foot: Secondary | ICD-10-CM

## 2018-03-17 DIAGNOSIS — Z760 Encounter for issue of repeat prescription: Secondary | ICD-10-CM | POA: Insufficient documentation

## 2018-03-17 DIAGNOSIS — Z79899 Other long term (current) drug therapy: Secondary | ICD-10-CM

## 2018-03-17 MED ORDER — OXYCODONE ER 18 MG PO C12A: 1.0000 | EXTENDED_RELEASE_CAPSULE | Freq: Two times a day (BID) | ORAL | 0 refills | Status: DC

## 2018-03-17 MED ORDER — OXYCODONE-ACETAMINOPHEN 5-325 MG PO TABS: 1.0000 | ORAL_TABLET | Freq: Two times a day (BID) | ORAL | 0 refills | Status: DC

## 2018-03-17 NOTE — Progress Notes (Signed)
Subjective:    Patient ID: David [name], male    DOB: April 04, 1976, 42 y.o.   MRN: [id number]  HPI: [name] is a 42 year old male who returns for follow up appointment for chronic pain and medication refill. He states his pain is located in his neck radiating into his right shoulder, mid- lower back, right hip, bilateral lower extremities with tingling and burning and left ankle. Also reports his pain has increased in intensity with working 40 hours a week, he's walking more which has increased his physical activity he states. He rates his pain 7. His current exercise regime is walking.  Mr. Siedlecki Morphine Equivalent is 74.40 MME. He is also prescribed Diazepam by [facility]. We have discussed the black box warning of using opioids and benzodiazepines. I highlighted the dangers of using these drugs together and discussed the adverse events including respiratory suppression, overdose, cognitive impairment and importance of compliance with current regimen. We will continue to monitor and adjust as indicated.   Last Oral Swab was performed on 11/25/2017, it was consistent.   Pain Inventory Average Pain 5 Pain Right Now 7 My pain is intermittent, constant, sharp, burning, dull, stabbing, tingling and aching  In the last 24 hours, has pain interfered with the following? General activity 6 Relation with others 6 Enjoyment of life 7 What TIME of day is your pain at its worst? all Sleep (in general) Fair  Pain is worse with: walking, bending, sitting, inactivity, standing and some activites Pain improves with: rest, heat/ice, pacing activities and medication Relief from Meds: 4  Mobility walk without assistance ability to climb steps?  yes do you drive?  yes  Function employed # of hrs/week 40  Neuro/Psych numbness tingling spasms depression anxiety  Prior Studies Any changes since last visit?  no  Physicians involved in your care Any changes since last visit?   no   Family History  Problem Relation Age of Onset  . Arthritis Mother   . Heart disease Mother   . Diabetes Mother   . Heart disease Father   . Diabetes Father    Social History   Socioeconomic History  . Marital status: Married    Spouse name: Not on file  . Number of children: Not on file  . Years of education: Not on file  . Highest education level: Not on file  Occupational History  . Not on file  Social Needs  . Financial resource strain: Not on file  . Food insecurity:    Worry: Not on file    Inability: Not on file  . Transportation needs:    Medical: Not on file    Non-medical: Not on file  Tobacco Use  . Smoking status: Never Smoker  . Smokeless tobacco: Never Used  Substance and Sexual Activity  . Alcohol use: No  . Drug use: No  . Sexual activity: Not on file  Lifestyle  . Physical activity:    Days per week: Not on file    Minutes per session: Not on file  . Stress: Not on file  Relationships  . Social connections:    Talks on phone: Not on file    Gets together: Not on file    Attends religious service: Not on file    Active member of club or organization: Not on file    Attends meetings of clubs or organizations: Not on file    Relationship status: Not on file  Other Topics Concern  .  Not on file  Social History Narrative  . Not on file   No past surgical history on file. Past Medical History:  Diagnosis Date  . Chronic pain syndrome   . 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 Lumbago   . Pain in joint, lower leg   . Postlaminectomy syndrome, cervical region   . Primary localized osteoarthrosis, lower leg   . Sciatica   . Thoracic radiculopathy    BP 111/73   Pulse (!) 102   Ht  (1.88 m)   Wt 229 lb (103.9 kg)   SpO2 97%   BMI 29.40 kg/m   Opioid Risk Score:   Fall Risk Score:  `1  Depression screen PHQ 2/9  Depression screen [facility] 2/9 01/20/2018 05/27/2017 02/23/2017 12/29/2016 01/02/2016 06/13/2015  01/11/2015  Decreased Interest 0 0  Down, Depressed, Hopeless PHQ - 2 Score Altered sleeping - - - - 2 - 1  Tired, decreased energy - - - - 2 - 1  Change in appetite - - - - 0 - 0  Feeling bad or failure about yourself  - - - - 0 - 0  Trouble concentrating - - - - 0 - 0  Moving slowly or fidgety/restless - - - - 0 - 0  Suicidal thoughts - - - - 0 - 0  PHQ-9 Score - - - - 6 - 3  Difficult doing work/chores - - - - Somewhat difficult - -    Review of Systems  Constitutional: Negative.   HENT: Negative.   Eyes: Negative.   Respiratory: Negative.   Cardiovascular: Negative.   Gastrointestinal: Negative.   Endocrine: Negative.   Genitourinary: Negative.   Musculoskeletal: Positive for arthralgias, back pain, myalgias, neck pain and neck stiffness.  Skin: Negative.   Allergic/Immunologic: Negative.   Neurological: Positive for numbness.  Hematological: Negative.   Psychiatric/Behavioral: Positive for dysphoric mood. The patient is nervous/anxious.   All other systems reviewed and are negative.      Objective:   Physical Exam  Constitutional: He is oriented to person, place, and time. He appears well-developed and well-nourished.  HENT:  Head: Normocephalic and atraumatic.  Neck: Normal range of motion. Neck supple.  Cardiovascular: Normal rate and regular rhythm.  Pulmonary/Chest: Effort normal.  Musculoskeletal:  Normal Muscle Bulk and Muscle Testing Reveals: Upper Extremities: Full ROM and Muscle Strength 5/5 Bilateral AC Joint Tenderness Thoracic Paraspinal Tenderness: T-1-T-3 T-7-T-9 Lumbar Hypersensitivity Bilateral Greater Trochanter Tenderness Lower Extremities: Full ROM and Muscle Strength 5/5 Arises from Table with ease Narrow Based Gait  Neurological: He is alert and oriented to person, place, and time.  Skin: Skin is warm and dry.  Psychiatric: He has a normal mood and affect.  Nursing note and vitals  reviewed.         Assessment & Plan:  1. Cervical postlaminectomy syndrome, Hx of ACDF C4-5, C6-7, C7-T1, 02/2008. Continue current medication and alternate using heat and ice therapy. 03/17/2018 2. Spondylosis of L-spine . Continue current treatment regime. 03/17/2018 3. Chronic postoperative pain: Refilled: Xtampza 18 mg one capsule every 12 hours for pain #60 and Oxycodone 5/325mg  one tablet twice a day #60. Continue Voltaren Gel/ Flector Patch. 03/17/2018. We will continue the opioid monitoring program, this consists of regular clinic visits, examinations, urine drug screen, pill counts as well as use of [place] Controlled Substance Reporting  System. 4. Diabetes with neuropathy. Continue current medication regimen with  Gabapentin. 03/17/2018 Keep blood sugars under tight control. Continue to monitor. 03/17/2018. 5. Cervicalgia/Cervical Radiculopathy/ Thoracic Radiculopathy/ LumbarRadiculopathy:Continue Gabapentin.03/17/2018 6. Greater Right Trochanter Bursitis: Continue Ice/Heat and Current medication regimen.03/17/2018 7. Chronic Pain Syndrome/ Myofascial Pain: Continue Flector Patch/ Alternates with Voltaren Gel. 05//16/2019 8. Left Ankle Pain: RX: X-ray.  20 minutes of face to face patient care time was spent during this visit. All questions were encouraged and answered.  F/U in 1 month

## 2018-04-22 ENCOUNTER — Encounter: Payer: BLUE CROSS/BLUE SHIELD | Attending: Physical Medicine & Rehabilitation | Admitting: Registered Nurse

## 2018-04-22 ENCOUNTER — Encounter: Payer: Self-pay | Admitting: Registered Nurse

## 2018-04-22 VITALS — BP 112/70 | HR 90 | Resp 14 | Ht 75.0 in | Wt 228.0 lb

## 2018-04-22 DIAGNOSIS — M5412 Radiculopathy, cervical region: Secondary | ICD-10-CM | POA: Diagnosis not present

## 2018-04-22 DIAGNOSIS — M79669 Pain in unspecified lower leg: Secondary | ICD-10-CM | POA: Diagnosis not present

## 2018-04-22 DIAGNOSIS — M7061 Trochanteric bursitis, right hip: Secondary | ICD-10-CM

## 2018-04-22 DIAGNOSIS — M5135 Other intervertebral disc degeneration, thoracolumbar region: Secondary | ICD-10-CM | POA: Insufficient documentation

## 2018-04-22 DIAGNOSIS — G894 Chronic pain syndrome: Secondary | ICD-10-CM | POA: Diagnosis not present

## 2018-04-22 DIAGNOSIS — G609 Hereditary and idiopathic neuropathy, unspecified: Secondary | ICD-10-CM | POA: Diagnosis not present

## 2018-04-22 DIAGNOSIS — M5414 Radiculopathy, thoracic region: Secondary | ICD-10-CM | POA: Diagnosis not present

## 2018-04-22 DIAGNOSIS — M5416 Radiculopathy, lumbar region: Secondary | ICD-10-CM | POA: Diagnosis not present

## 2018-04-22 DIAGNOSIS — M5134 Other intervertebral disc degeneration, thoracic region: Secondary | ICD-10-CM | POA: Diagnosis not present

## 2018-04-22 DIAGNOSIS — M542 Cervicalgia: Secondary | ICD-10-CM

## 2018-04-22 DIAGNOSIS — M7918 Myalgia, other site: Secondary | ICD-10-CM

## 2018-04-22 DIAGNOSIS — M25571 Pain in right ankle and joints of right foot: Secondary | ICD-10-CM | POA: Diagnosis not present

## 2018-04-22 DIAGNOSIS — M47817 Spondylosis without myelopathy or radiculopathy, lumbosacral region: Secondary | ICD-10-CM | POA: Diagnosis not present

## 2018-04-22 DIAGNOSIS — M25572 Pain in left ankle and joints of left foot: Secondary | ICD-10-CM

## 2018-04-22 DIAGNOSIS — M5430 Sciatica, unspecified side: Secondary | ICD-10-CM | POA: Diagnosis not present

## 2018-04-22 DIAGNOSIS — M47896 Other spondylosis, lumbar region: Secondary | ICD-10-CM | POA: Diagnosis not present

## 2018-04-22 DIAGNOSIS — G8929 Other chronic pain: Secondary | ICD-10-CM | POA: Diagnosis not present

## 2018-04-22 DIAGNOSIS — M5489 Other dorsalgia: Secondary | ICD-10-CM | POA: Insufficient documentation

## 2018-04-22 DIAGNOSIS — M546 Pain in thoracic spine: Secondary | ICD-10-CM | POA: Diagnosis not present

## 2018-04-22 DIAGNOSIS — M961 Postlaminectomy syndrome, not elsewhere classified: Secondary | ICD-10-CM | POA: Diagnosis not present

## 2018-04-22 DIAGNOSIS — Z760 Encounter for issue of repeat prescription: Secondary | ICD-10-CM | POA: Diagnosis present

## 2018-04-22 DIAGNOSIS — Z5181 Encounter for therapeutic drug level monitoring: Secondary | ICD-10-CM

## 2018-04-22 DIAGNOSIS — Z79899 Other long term (current) drug therapy: Secondary | ICD-10-CM

## 2018-04-22 DIAGNOSIS — M179 Osteoarthritis of knee, unspecified: Secondary | ICD-10-CM | POA: Diagnosis not present

## 2018-04-22 DIAGNOSIS — E119 Type 2 diabetes mellitus without complications: Secondary | ICD-10-CM | POA: Insufficient documentation

## 2018-04-22 MED ORDER — OXYCODONE-ACETAMINOPHEN 5-325 MG PO TABS: 1.0000 | ORAL_TABLET | Freq: Two times a day (BID) | ORAL | 0 refills | Status: DC

## 2018-04-22 MED ORDER — OXYCODONE ER 18 MG PO C12A: 1.0000 | EXTENDED_RELEASE_CAPSULE | Freq: Two times a day (BID) | ORAL | 0 refills | Status: DC

## 2018-04-22 NOTE — Progress Notes (Signed)
Subjective:    Patient ID: David [name], male    DOB: 02/28/1976, 42 y.o.   MRN: [id number]  HPI: [name] is a 42 year old male who returns for follow up appointment for chronic pain and medication refill. He states his pain is located in his neck mainly right side radiating into his left arm with tingling and numbness, mid- back pain radiating upwards,lower back pain radiating into his bilateral lower extremities L>R, right hip and bilateral ankle pain R>L. He rates his pain 7. His current exercise regime is walking.   Mr. Allbaugh Morphine Equivalent is 69.00 MME. Last Oral Swab was Performed on 12/01/2017, it was consistent.   Pain Inventory Average Pain 6 Pain Right Now 7 My pain is intermittent, constant, sharp, burning, dull, stabbing, tingling and aching  In the last 24 hours, has pain interfered with the following? General activity 6 Relation with others 6 Enjoyment of life 6 What TIME of day is your pain at its worst? always Sleep (in general) Fair  Pain is worse with: walking, bending, sitting, inactivity, standing and some activites Pain improves with: rest, heat/ice, therapy/exercise, pacing activities and medication Relief from Meds: 4  Mobility walk without assistance how many minutes can you walk? 45 ability to climb steps?  yes do you drive?  yes  Function employed # of hrs/week 50+  Neuro/Psych numbness tingling spasms depression anxiety  Prior Studies Any changes since last visit?  no  Physicians involved in your care Any changes since last visit?  no   Family History  Problem Relation Age of Onset  . Arthritis Mother   . Heart disease Mother   . Diabetes Mother   . Heart disease Father   . Diabetes Father    Social History   Socioeconomic History  . Marital status: Married    Spouse name: Not on file  . Number of children: Not on file  . Years of education: Not on file  . Highest education level: Not on file  Occupational  History  . Not on file  Social Needs  . Financial resource strain: Not on file  . Food insecurity:    Worry: Not on file    Inability: Not on file  . Transportation needs:    Medical: Not on file    Non-medical: Not on file  Tobacco Use  . Smoking status: Never Smoker  . Smokeless tobacco: Never Used  Substance and Sexual Activity  . Alcohol use: No  . Drug use: No  . Sexual activity: Not on file  Lifestyle  . Physical activity:    Days per week: Not on file    Minutes per session: Not on file  . Stress: Not on file  Relationships  . Social connections:    Talks on phone: Not on file    Gets together: Not on file    Attends religious service: Not on file    Active member of club or organization: Not on file    Attends meetings of clubs or organizations: Not on file    Relationship status: Not on file  Other Topics Concern  . Not on file  Social History Narrative  . Not on file   History reviewed. No pertinent surgical history. Past Medical History:  Diagnosis Date  . Chronic pain syndrome   . 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 Lumbago   . Pain in joint, lower leg   . Postlaminectomy syndrome, cervical  region   . Primary localized osteoarthrosis, lower leg   . Sciatica   . Thoracic radiculopathy    BP 112/70 (BP Location: Right Arm, Patient Position: Sitting, Cuff Size: Normal)   Pulse 90   Resp 14   Ht 6\' 3"  (1.905 m)   Wt 228 lb (103.4 kg)   SpO2 98%   BMI 28.50 kg/m   Opioid Risk Score:   Fall Risk Score:  `1  Depression screen PHQ 2/9  Depression screen [facility] 2/9 01/20/2018 05/27/2017 02/23/2017 12/29/2016 01/02/2016 06/13/2015 01/11/2015  Decreased Interest 1 1 1 1 1  0 0  Down, Depressed, Hopeless 1 1 1 1 1 1 1   PHQ - 2 Score 2 2 2 2 2 1 1   Altered sleeping - - - - 2 - 1  Tired, decreased energy - - - - 2 - 1  Change in appetite - - - - 0 - 0  Feeling bad or failure about yourself  - - - - 0 - 0  Trouble concentrating - - -  - 0 - 0  Moving slowly or fidgety/restless - - - - 0 - 0  Suicidal thoughts - - - - 0 - 0  PHQ-9 Score - - - - 6 - 3  Difficult doing work/chores - - - - Somewhat difficult - -    Review of Systems  Constitutional: Negative.   HENT: Negative.   Eyes: Negative.   Respiratory: Negative.   Cardiovascular: Negative.   Gastrointestinal: Negative.   Endocrine: Negative.   Genitourinary: Negative.   Musculoskeletal: Positive for arthralgias, back pain, myalgias, neck pain and neck stiffness.       Spasms   Skin: Negative.   Allergic/Immunologic: Negative.   Neurological: Positive for numbness.       Tingling   Psychiatric/Behavioral: Positive for dysphoric mood. The patient is nervous/anxious.        Objective:   Physical Exam  Constitutional: He is oriented to person, place, and time. He appears well-developed and well-nourished.  HENT:  Head: Normocephalic and atraumatic.  Neck: Normal range of motion. Neck supple.  Cervical Paraspinal Tenderness: C-5-C-6  Cardiovascular: Normal rate and regular rhythm.  Pulmonary/Chest: Effort normal and breath sounds normal.  Musculoskeletal:  Normal Muscle Bulk and Muscle Testing Reveals: Upper Extremities: Full ROM and Muscle Strength 5/5 Bilateral AC Joint Tenderness Thoracic Paraspinal Tenderness: T-7-T-9 Lumbar Paraspinal Tenderness: L-3-L-5 Mainly Left Side Lower Extremities: Full ROM and Muscle Strength 5/5 Right Lower Extremity Flexion Produces Pain into right ankle Arises from Table with ease Narrow Based Gait  Neurological: He is alert and oriented to person, place, and time.  Skin: Skin is warm and dry.  Psychiatric: He has a normal mood and affect.  Nursing note and vitals reviewed.         Assessment & Plan:  1. Cervical postlaminectomy syndrome, Hx of ACDF C4-5, C6-7, C7-T1, 02/2008. Continue current medication and alternate using heat and ice therapy. 04/22/2018 2. Spondylosis of L-spine . Continue current  treatment regime. 04/22/2018 3. Chronic postoperative pain: Refilled: Xtampza 18 mg one capsule every 12 hours for pain #60 and Oxycodone 5/325mg  one tablet twice a day #60. Continue Voltaren Gel/ Flector Patch. 04/22/2018. We will continue the opioid monitoring program, this consists of regular clinic visits, examinations, urine drug screen, pill counts as well as use of [place] Controlled Substance Reporting System. 4. Diabetes with neuropathy. Continue current medication regimen with  Gabapentin.Keep blood sugars under tight control. Continue to monitor.04/22/2018. 5.Cervicalgia/Cervical Radiculopathy/ ThoracicRadiculopathy/  LumbarRadiculopathy:Continue Gabapentin.04/22/2018 6. Greater Right Trochanter Bursitis: Continue Ice/Heat and Current medication regimen.04/22/2018 7. Chronic Pain Syndrome/ Myofascial Pain: Continue Flector Patch/ Alternates with Voltaren Gel. 06//21/2019 8. Left Ankle Pain:: X-ray. Awaiting Results. 04/22/2018 9. Right Ankle Pain: RX: Right Ankle X-ray. 04/22/2018.  20 minutes of face to face patient care time was spent during this visit. All questions were encouraged and answered.  F/U in 1 month

## 2018-04-25 ENCOUNTER — Other Ambulatory Visit: Payer: Self-pay | Admitting: Physical Medicine & Rehabilitation

## 2018-04-25 MED ORDER — GABAPENTIN 100 MG PO CAPS: ORAL_CAPSULE | ORAL | 1 refills | Status: DC

## 2018-05-20 ENCOUNTER — Encounter: Payer: Self-pay | Admitting: Registered Nurse

## 2018-05-20 ENCOUNTER — Encounter: Payer: BLUE CROSS/BLUE SHIELD | Attending: Physical Medicine & Rehabilitation | Admitting: Registered Nurse

## 2018-05-20 VITALS — BP 105/71 | HR 91 | Ht 75.0 in | Wt 223.0 lb

## 2018-05-20 DIAGNOSIS — M542 Cervicalgia: Secondary | ICD-10-CM

## 2018-05-20 DIAGNOSIS — M5412 Radiculopathy, cervical region: Secondary | ICD-10-CM | POA: Diagnosis not present

## 2018-05-20 DIAGNOSIS — G8929 Other chronic pain: Secondary | ICD-10-CM | POA: Insufficient documentation

## 2018-05-20 DIAGNOSIS — M5134 Other intervertebral disc degeneration, thoracic region: Secondary | ICD-10-CM | POA: Diagnosis not present

## 2018-05-20 DIAGNOSIS — M5430 Sciatica, unspecified side: Secondary | ICD-10-CM | POA: Diagnosis not present

## 2018-05-20 DIAGNOSIS — M47896 Other spondylosis, lumbar region: Secondary | ICD-10-CM | POA: Diagnosis not present

## 2018-05-20 DIAGNOSIS — M5416 Radiculopathy, lumbar region: Secondary | ICD-10-CM

## 2018-05-20 DIAGNOSIS — M7918 Myalgia, other site: Secondary | ICD-10-CM | POA: Diagnosis not present

## 2018-05-20 DIAGNOSIS — M5135 Other intervertebral disc degeneration, thoracolumbar region: Secondary | ICD-10-CM | POA: Insufficient documentation

## 2018-05-20 DIAGNOSIS — M47817 Spondylosis without myelopathy or radiculopathy, lumbosacral region: Secondary | ICD-10-CM | POA: Diagnosis not present

## 2018-05-20 DIAGNOSIS — Z760 Encounter for issue of repeat prescription: Secondary | ICD-10-CM | POA: Diagnosis present

## 2018-05-20 DIAGNOSIS — M7061 Trochanteric bursitis, right hip: Secondary | ICD-10-CM

## 2018-05-20 DIAGNOSIS — M79669 Pain in unspecified lower leg: Secondary | ICD-10-CM | POA: Diagnosis not present

## 2018-05-20 DIAGNOSIS — G609 Hereditary and idiopathic neuropathy, unspecified: Secondary | ICD-10-CM

## 2018-05-20 DIAGNOSIS — M961 Postlaminectomy syndrome, not elsewhere classified: Secondary | ICD-10-CM | POA: Insufficient documentation

## 2018-05-20 DIAGNOSIS — Z5181 Encounter for therapeutic drug level monitoring: Secondary | ICD-10-CM | POA: Diagnosis not present

## 2018-05-20 DIAGNOSIS — Z79899 Other long term (current) drug therapy: Secondary | ICD-10-CM

## 2018-05-20 DIAGNOSIS — M179 Osteoarthritis of knee, unspecified: Secondary | ICD-10-CM | POA: Diagnosis not present

## 2018-05-20 DIAGNOSIS — G894 Chronic pain syndrome: Secondary | ICD-10-CM

## 2018-05-20 DIAGNOSIS — M5489 Other dorsalgia: Secondary | ICD-10-CM | POA: Diagnosis not present

## 2018-05-20 DIAGNOSIS — M5414 Radiculopathy, thoracic region: Secondary | ICD-10-CM | POA: Diagnosis not present

## 2018-05-20 DIAGNOSIS — E119 Type 2 diabetes mellitus without complications: Secondary | ICD-10-CM | POA: Insufficient documentation

## 2018-05-20 MED ORDER — OXYCODONE-ACETAMINOPHEN 5-325 MG PO TABS: 1.0000 | ORAL_TABLET | Freq: Two times a day (BID) | ORAL | 0 refills | Status: DC

## 2018-05-20 MED ORDER — OXYCODONE ER 18 MG PO C12A: 1.0000 | EXTENDED_RELEASE_CAPSULE | Freq: Two times a day (BID) | ORAL | 0 refills | Status: DC

## 2018-05-20 NOTE — Progress Notes (Signed)
Subjective:    Patient ID: David [name], male    DOB: 1975-12-23, 42 y.o.   MRN: [id number]  HPI: [name] is a 42 year old male who returns for follow up appointment for chronic pain and medication refill. He states his pain is located in his neck radiating into his left shoulder and left arm with tingling and burning pain, mid- lower back pain also reports his lower back pain is radiating upwards, lower back pain radiating into left lower extremity and right hip. She rates her pain 7. Her current exercise regime is walking.   [name] is 69.00 MME. He is also prescribed diazepam  by Dr. Elyn Peers, last fill on 12/23/2017. [name] using it sparingly. We have discussed the black box warning of using opioids and benzodiazepines.  I highlighted the dangers of using these drugs together and discussed the adverse events including respiratory suppression, overdose, cognitive impairment and importance of compliance with current regimen. We will continue to monitor and adjust as indicated.   Last Oral Swab was Performed on 11/25/2017, it was consistent.   Pain Inventory Average Pain 5 Pain Right Now 7 My pain is intermittent, constant, sharp, burning, dull, stabbing, tingling and aching  In the last 24 hours, has pain interfered with the following? General activity 7 Relation with others 6 Enjoyment of life 6 What TIME of day is your pain at its worst? all Sleep (in general) Fair  Pain is worse with: walking, bending, sitting, inactivity, standing and some activites Pain improves with: rest, heat/ice, therapy/exercise, pacing activities and medication Relief from Meds: 5  Mobility ability to climb steps?  yes do you drive?  yes  Function employed # of hrs/week 40  Neuro/Psych numbness tingling spasms depression anxiety  Prior Studies Any changes since last visit?  no  Physicians involved in your care Any changes since last visit?  no   Family  History  Problem Relation Age of Onset  . Arthritis Mother   . Heart disease Mother   . Diabetes Mother   . Heart disease Father   . Diabetes Father    Social History   Socioeconomic History  . Marital status: Married    Spouse name: Not on file  . Number of children: Not on file  . Years of education: Not on file  . Highest education level: Not on file  Occupational History  . Not on file  Social Needs  . Financial resource strain: Not on file  . Food insecurity:    Worry: Not on file    Inability: Not on file  . Transportation needs:    Medical: Not on file    Non-medical: Not on file  Tobacco Use  . Smoking status: Never Smoker  . Smokeless tobacco: Never Used  Substance and Sexual Activity  . Alcohol use: No  . Drug use: No  . Sexual activity: Not on file  Lifestyle  . Physical activity:    Days per week: Not on file    Minutes per session: Not on file  . Stress: Not on file  Relationships  . Social connections:    Talks on phone: Not on file    Gets together: Not on file    Attends religious service: Not on file    Active member of club or organization: Not on file    Attends meetings of clubs or organizations: Not on file    Relationship status: Not on file  Other  Topics Concern  . Not on file  Social History Narrative  . Not on file   No past surgical history on file. Past Medical History:  Diagnosis Date  . Chronic pain syndrome   . 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 Lumbago   . Pain in joint, lower leg   . Postlaminectomy syndrome, cervical region   . Primary localized osteoarthrosis, lower leg   . Sciatica   . Thoracic radiculopathy    There were no vitals taken for this visit.  Opioid Risk Score:   Fall Risk Score:  `1  Depression screen PHQ 2/9  Depression screen [facility] 2/9 01/20/2018 05/27/2017 02/23/2017 12/29/2016 01/02/2016 06/13/2015 01/11/2015  Decreased Interest 1 1 1 1 1  0 0  Down, Depressed, Hopeless 1 1  1 1 1 1 1   PHQ - 2 Score 2 2 2 2 2 1 1   Altered sleeping - - - - 2 - 1  Tired, decreased energy - - - - 2 - 1  Change in appetite - - - - 0 - 0  Feeling bad or failure about yourself  - - - - 0 - 0  Trouble concentrating - - - - 0 - 0  Moving slowly or fidgety/restless - - - - 0 - 0  Suicidal thoughts - - - - 0 - 0  PHQ-9 Score - - - - 6 - 3  Difficult doing work/chores - - - - Somewhat difficult - -     Review of Systems  Constitutional: Negative.   HENT: Negative.   Eyes: Negative.   Respiratory: Negative.   Cardiovascular: Negative.   Gastrointestinal: Negative.   Endocrine: Negative.   Genitourinary: Negative.   Musculoskeletal: Positive for arthralgias, back pain and myalgias.  Skin: Negative.   Allergic/Immunologic: Negative.   Neurological: Positive for numbness.  Hematological: Negative.   Psychiatric/Behavioral: Positive for dysphoric mood. The patient is nervous/anxious.   All other systems reviewed and are negative.      Objective:   Physical Exam  Constitutional: He is oriented to person, place, and time. He appears well-developed and well-nourished.  HENT:  Head: Normocephalic and atraumatic.  Neck: Normal range of motion. Neck supple.  Cardiovascular: Normal rate and regular rhythm.  Pulmonary/Chest: Effort normal and breath sounds normal.  Musculoskeletal:  Normal Muscle Bulk and Muscle Testing Reveals: Upper Extremities: Full ROM and Muscle Strength 5/5 Bilateral AC Joint Tenderness Thoracic Paraspinal Tenderness: T-3-T-7 Lumbar Paraspinal Tenderness: L-3-L-5 Right Greater Trochanter Tenderness Lower Extremities: Full ROM and Muscle Strength 5/5 Arises from Table with ease Narrow Based gait  Neurological: He is alert and oriented to person, place, and time.  Skin: Skin is warm and dry.  Psychiatric: He has a normal mood and affect. His behavior is normal.  Nursing note and vitals reviewed.         Assessment & Plan:  1. Cervical  postlaminectomy syndrome, Hx of ACDF C4-5, C6-7, C7-T1, 02/2008. Continue current medication and alternate using heat and ice therapy. 05/20/2018 2. Spondylosis of L-spine . Continue current treatment regime. 05/20/2018 3. Chronic postoperative pain:Refilled:Xtampza 18 mg one capsule every 12 hours for pain #60 andOxycodone 5/325mg  one tablet twice a day #60. Continue Voltaren Gel/ Flector Patch. 05/20/2018. We will continue the opioid monitoring program, this consists of regular clinic visits, examinations, urine drug screen, pill counts as well as use of [place] Controlled Substance Reporting System. 4. Diabetes with neuropathy. Continuecurrent medication regimen withGabapentin.Keep blood sugars under tight control. Continue to  monitor.05/20/2018. 5.Cervicalgia/Cervical Radiculopathy/ ThoracicRadiculopathy/ LumbarRadiculopathy:Continue Gabapentin.05/20/2018 6. Greater Right Trochanter Bursitis: Continue Ice/Heat and Current medication regimen.05/20/2018 7. Chronic Pain Syndrome/ Myofascial Pain: Continue Flector Patch/ Alternates with Voltaren Gel. 07//19/2019  20 minutes of face to face patient care time was spent during this visit. All questions were encouraged and answered.  F/U in 1 month

## 2018-05-26 ENCOUNTER — Telehealth: Payer: Self-pay | Admitting: *Deleted

## 2018-05-26 NOTE — Telephone Encounter (Addendum)
Oral swab drug screen was consistent for prescribed medications. Alcohol portion was added 05/26/18 as the order did not get completed. It was negative.

## 2018-05-29 LAB — DRUG TOX MONITOR 1 W/CONF, ORAL FLD
Alprazolam: NEGATIVE ng/mL (ref <0.50)
Amphetamines: NEGATIVE ng/mL (ref <10)
Barbiturates: NEGATIVE ng/mL (ref <10)
Benzodiazepines: POSITIVE ng/mL — AB (ref <0.50)
Buprenorphine: NEGATIVE ng/mL (ref <0.10)
CLONAZEPAM: NEGATIVE ng/mL (ref <0.50)
COCAINE: NEGATIVE ng/mL (ref <5.0)
Chlordiazepoxide: NEGATIVE ng/mL (ref <0.50)
Codeine: NEGATIVE ng/mL (ref <2.5)
DIAZEPAM: 1.73 ng/mL — AB (ref <0.50)
DIHYDROCODEINE: NEGATIVE ng/mL (ref <2.5)
FLURAZEPAM: NEGATIVE ng/mL (ref <0.50)
Fentanyl: NEGATIVE ng/mL (ref <0.10)
Flunitrazepam: NEGATIVE ng/mL (ref <0.50)
Heroin Metabolite: NEGATIVE ng/mL (ref <1.0)
Hydrocodone: NEGATIVE ng/mL (ref <2.5)
Hydromorphone: NEGATIVE ng/mL (ref <2.5)
LORAZEPAM: NEGATIVE ng/mL (ref <0.50)
MARIJUANA: NEGATIVE ng/mL (ref <2.5)
MDMA: NEGATIVE ng/mL (ref <10)
METHADONE: NEGATIVE ng/mL (ref <5.0)
MORPHINE: NEGATIVE ng/mL (ref <2.5)
Meprobamate: NEGATIVE ng/mL (ref <2.5)
Midazolam: NEGATIVE ng/mL (ref <0.50)
NICOTINE METABOLITE: NEGATIVE ng/mL (ref <5.0)
NORHYDROCODONE: NEGATIVE ng/mL (ref <2.5)
Nordiazepam: 4.46 ng/mL — ABNORMAL HIGH (ref <0.50)
Noroxycodone: 61.9 ng/mL — ABNORMAL HIGH (ref <2.5)
OXYMORPHONE: 4.2 ng/mL — AB (ref <2.5)
Opiates: POSITIVE ng/mL — AB (ref <2.5)
Oxazepam: NEGATIVE ng/mL (ref <0.50)
Phencyclidine: NEGATIVE ng/mL (ref <10)
TEMAZEPAM: NEGATIVE ng/mL (ref <0.50)
TRAMADOL: NEGATIVE ng/mL (ref <5.0)
TRIAZOLAM: NEGATIVE ng/mL (ref <0.50)
Tapentadol: NEGATIVE ng/mL (ref <5.0)
Zolpidem: NEGATIVE ng/mL (ref <5.0)

## 2018-05-29 LAB — TEST AUTHORIZATION

## 2018-05-29 LAB — DRUG TOX ALC METAB W/CON, ORAL FLD: Alcohol Metabolite: NEGATIVE ng/mL (ref <25)

## 2018-06-24 ENCOUNTER — Ambulatory Visit: Payer: BLUE CROSS/BLUE SHIELD | Admitting: Physical Medicine & Rehabilitation

## 2018-06-24 ENCOUNTER — Encounter: Payer: Self-pay | Admitting: Physical Medicine & Rehabilitation

## 2018-06-24 ENCOUNTER — Encounter: Payer: BLUE CROSS/BLUE SHIELD | Attending: Physical Medicine & Rehabilitation

## 2018-06-24 ENCOUNTER — Other Ambulatory Visit: Payer: Self-pay

## 2018-06-24 VITALS — BP 116/75 | HR 88 | Ht 75.0 in | Wt 218.0 lb

## 2018-06-24 DIAGNOSIS — M179 Osteoarthritis of knee, unspecified: Secondary | ICD-10-CM | POA: Diagnosis not present

## 2018-06-24 DIAGNOSIS — M5135 Other intervertebral disc degeneration, thoracolumbar region: Secondary | ICD-10-CM | POA: Insufficient documentation

## 2018-06-24 DIAGNOSIS — M5134 Other intervertebral disc degeneration, thoracic region: Secondary | ICD-10-CM | POA: Insufficient documentation

## 2018-06-24 DIAGNOSIS — E119 Type 2 diabetes mellitus without complications: Secondary | ICD-10-CM | POA: Diagnosis not present

## 2018-06-24 DIAGNOSIS — Z760 Encounter for issue of repeat prescription: Secondary | ICD-10-CM | POA: Insufficient documentation

## 2018-06-24 DIAGNOSIS — M47896 Other spondylosis, lumbar region: Secondary | ICD-10-CM | POA: Diagnosis not present

## 2018-06-24 DIAGNOSIS — M5414 Radiculopathy, thoracic region: Secondary | ICD-10-CM | POA: Diagnosis not present

## 2018-06-24 DIAGNOSIS — G8929 Other chronic pain: Secondary | ICD-10-CM | POA: Insufficient documentation

## 2018-06-24 DIAGNOSIS — M5430 Sciatica, unspecified side: Secondary | ICD-10-CM | POA: Diagnosis not present

## 2018-06-24 DIAGNOSIS — M79669 Pain in unspecified lower leg: Secondary | ICD-10-CM | POA: Insufficient documentation

## 2018-06-24 DIAGNOSIS — M5489 Other dorsalgia: Secondary | ICD-10-CM | POA: Diagnosis not present

## 2018-06-24 DIAGNOSIS — M419 Scoliosis, unspecified: Secondary | ICD-10-CM

## 2018-06-24 DIAGNOSIS — M961 Postlaminectomy syndrome, not elsewhere classified: Secondary | ICD-10-CM | POA: Diagnosis not present

## 2018-06-24 MED ORDER — OXYCODONE-ACETAMINOPHEN 5-325 MG PO TABS: 1.0000 | ORAL_TABLET | Freq: Two times a day (BID) | ORAL | 0 refills | Status: DC

## 2018-06-24 MED ORDER — OXYCODONE ER 18 MG PO C12A: 1.0000 | EXTENDED_RELEASE_CAPSULE | Freq: Two times a day (BID) | ORAL | 0 refills | Status: DC

## 2018-06-24 NOTE — Progress Notes (Signed)
Subjective:    Patient ID: David [name], male    DOB: 1976/08/18, 42 y.o.   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 General activity 5 Relation with others 6 Enjoyment of life 6 What TIME of day is your pain at its worst? all Sleep (in general) Fair  Pain is worse with: walking, bending, sitting, inactivity, standing and some activites Pain improves with: rest, heat/ice, therapy/exercise, pacing activities and medication Relief from Meds: 4  Mobility walk without assistance how many minutes can you walk? 60 ability to climb steps?  yes do you drive?  yes  Function employed # of hrs/week 50  Neuro/Psych numbness trouble walking spasms depression anxiety  Prior Studies Any changes since last visit?  no x-rays CT/MRI nerve study  Physicians involved in your care Any changes since last visit?  no Primary care [facility]   Family History  Problem Relation Age of Onset  . Arthritis Mother   . Heart disease Mother   . Diabetes Mother   . Heart disease Father   . Diabetes Father    Social History   Socioeconomic History  . Marital status: Married    Spouse name: Not on file  . Number of children: Not on file  . Years of education: Not on file  . Highest education level: Not on file  Occupational History  . Not on file  Social Needs  . Financial resource strain:  Not on file  . Food insecurity:    Worry: Not on file    Inability: Not on file  . Transportation needs:    Medical: Not on file    Non-medical: Not on file  Tobacco Use  . Smoking status: Never Smoker  . Smokeless tobacco: Never Used  Substance and Sexual Activity  . Alcohol use: No  . Drug use: No  . Sexual activity: Not on file  Lifestyle  . Physical activity:    Days per week: Not on file    Minutes per session: Not on file  . Stress: Not on file  Relationships  . Social connections:    Talks on phone: Not on file    Gets together: Not on file    Attends religious service: Not on file    Active member of club or organization: Not on file    Attends meetings of clubs or organizations: Not on file    Relationship status: Not on file  Other Topics Concern  . Not on file  Social History Narrative  . Not on file   No past surgical history on file. Past Medical History:  Diagnosis Date  . Chronic pain syndrome   . 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 Lumbago   . Pain in joint, lower leg   . Postlaminectomy syndrome, cervical region   . Primary localized osteoarthrosis, lower leg   . Sciatica   .  Thoracic radiculopathy    BP 116/75   Pulse 88   Ht 6\' 3"  (1.905 m)   Wt 218 lb (98.9 kg)   SpO2 98%   BMI 27.25 kg/m   Opioid Risk Score:   Fall Risk Score:  `1  Depression screen PHQ 2/9  [facility] 06/24/2018 01/20/2018 05/27/2017 02/23/2017 12/29/2016 01/02/2016 06/13/2015  Decreased Interest 3 1 1 1 1 1  0  Down, Depressed, Hopeless 3 1 1 1 1 1 1   PHQ - 2 Score 6 2 2 2 2 2 1   Altered sleeping 3 - - - - 2 -  Tired, decreased energy 3 - - - - 2 -  Change in appetite 0 - - - - 0 -  Feeling bad or failure about yourself  0 - - - - 0 -  Trouble concentrating 0 - - - - 0 -  Moving slowly or fidgety/restless 0 - - - - 0 -  Suicidal thoughts 0 - - - - 0 -  PHQ-9 Score 12 - - - - 6 -  Difficult doing work/chores Not difficult at  all - - - - Somewhat difficult -    Review of Systems  Constitutional: Negative.   HENT: Negative.   Eyes: Negative.   Respiratory: Negative.   Cardiovascular: Negative.   Gastrointestinal: Negative.   Endocrine: Negative.   Genitourinary: Negative.   Musculoskeletal: Negative.   Skin: Negative.   Allergic/Immunologic: Negative.   Neurological: Negative.   Hematological: Negative.   Psychiatric/Behavioral: Negative.   All other systems reviewed and are negative.      Objective:   Physical Exam  Constitutional: He is oriented to person, place, and time. He appears well-developed and well-nourished. No distress.  HENT:  Head: Normocephalic and atraumatic.  Eyes: Pupils are equal, round, and reactive to light. EOM are normal.  Musculoskeletal:  Mild tenderness medial scapular border R>L  Neurological: He is alert and oriented to person, place, and time. He has normal strength.  Skin: He is not diaphoretic.  Nursing note and vitals reviewed. Motor strength is 5/5 bilateral deltoid bicep tricep grip hip flexion extension ankle dorsiflexion Is without evidence of  toe drag or knee instability         Assessment & Plan:  1.  Cervical postlaminectomy syndrome with chronic neck pain and upper back pain.  He does have thoracic scoliosis which may be contributing to his upper back pain as well. Will repeat thoracic x-rays. PMP aware reviewed Has CSA UDS appropriate 05/20/18 Cont Xtampza 18mg  BID #60, pt would like to switch back to OxyCR 20mg  BID, will hold of for now Cont Oxy IR 5mg  BID #60 2.  Patient developing some C8 distribution numbness as well as C6 distribution, he does have diabetic polyneuropathy which may be progressing.  Other potential would be he is developing some more stenosis on the left side in his lower cervical spine.  No motor weakness at this point. Left ulnar conduction in November 2018 showed no abnormalities.  If this progresses may consider repeat  cervical MRI

## 2018-06-24 NOTE — Patient Instructions (Signed)
Continue home exercise program

## 2018-07-22 ENCOUNTER — Encounter: Payer: Self-pay | Admitting: Registered Nurse

## 2018-07-22 ENCOUNTER — Encounter: Payer: BLUE CROSS/BLUE SHIELD | Attending: Physical Medicine & Rehabilitation | Admitting: Registered Nurse

## 2018-07-22 VITALS — BP 118/71 | HR 86 | Resp 16 | Ht 75.0 in | Wt 216.0 lb

## 2018-07-22 DIAGNOSIS — M5414 Radiculopathy, thoracic region: Secondary | ICD-10-CM | POA: Diagnosis not present

## 2018-07-22 DIAGNOSIS — E119 Type 2 diabetes mellitus without complications: Secondary | ICD-10-CM | POA: Diagnosis not present

## 2018-07-22 DIAGNOSIS — M179 Osteoarthritis of knee, unspecified: Secondary | ICD-10-CM | POA: Diagnosis not present

## 2018-07-22 DIAGNOSIS — M79669 Pain in unspecified lower leg: Secondary | ICD-10-CM | POA: Diagnosis not present

## 2018-07-22 DIAGNOSIS — M47896 Other spondylosis, lumbar region: Secondary | ICD-10-CM | POA: Diagnosis not present

## 2018-07-22 DIAGNOSIS — M7061 Trochanteric bursitis, right hip: Secondary | ICD-10-CM

## 2018-07-22 DIAGNOSIS — G894 Chronic pain syndrome: Secondary | ICD-10-CM

## 2018-07-22 DIAGNOSIS — M542 Cervicalgia: Secondary | ICD-10-CM

## 2018-07-22 DIAGNOSIS — M419 Scoliosis, unspecified: Secondary | ICD-10-CM | POA: Diagnosis not present

## 2018-07-22 DIAGNOSIS — G8929 Other chronic pain: Secondary | ICD-10-CM | POA: Diagnosis not present

## 2018-07-22 DIAGNOSIS — Z79899 Other long term (current) drug therapy: Secondary | ICD-10-CM

## 2018-07-22 DIAGNOSIS — M5412 Radiculopathy, cervical region: Secondary | ICD-10-CM

## 2018-07-22 DIAGNOSIS — G609 Hereditary and idiopathic neuropathy, unspecified: Secondary | ICD-10-CM

## 2018-07-22 DIAGNOSIS — M7918 Myalgia, other site: Secondary | ICD-10-CM

## 2018-07-22 DIAGNOSIS — M47817 Spondylosis without myelopathy or radiculopathy, lumbosacral region: Secondary | ICD-10-CM

## 2018-07-22 DIAGNOSIS — M5135 Other intervertebral disc degeneration, thoracolumbar region: Secondary | ICD-10-CM | POA: Diagnosis not present

## 2018-07-22 DIAGNOSIS — M5430 Sciatica, unspecified side: Secondary | ICD-10-CM | POA: Diagnosis not present

## 2018-07-22 DIAGNOSIS — Z760 Encounter for issue of repeat prescription: Secondary | ICD-10-CM | POA: Diagnosis not present

## 2018-07-22 DIAGNOSIS — M5134 Other intervertebral disc degeneration, thoracic region: Secondary | ICD-10-CM | POA: Diagnosis not present

## 2018-07-22 DIAGNOSIS — M5416 Radiculopathy, lumbar region: Secondary | ICD-10-CM

## 2018-07-22 DIAGNOSIS — M961 Postlaminectomy syndrome, not elsewhere classified: Secondary | ICD-10-CM

## 2018-07-22 DIAGNOSIS — Z5181 Encounter for therapeutic drug level monitoring: Secondary | ICD-10-CM

## 2018-07-22 DIAGNOSIS — M5489 Other dorsalgia: Secondary | ICD-10-CM | POA: Insufficient documentation

## 2018-07-22 MED ORDER — OXYCODONE ER 18 MG PO C12A: 1.0000 | EXTENDED_RELEASE_CAPSULE | Freq: Two times a day (BID) | ORAL | 0 refills | Status: DC

## 2018-07-22 MED ORDER — OXYCODONE-ACETAMINOPHEN 5-325 MG PO TABS: 1.0000 | ORAL_TABLET | Freq: Two times a day (BID) | ORAL | 0 refills | Status: DC

## 2018-07-22 NOTE — Progress Notes (Signed)
Subjective:    Patient ID: David [name], male    DOB: 12-28-1975, 42 y.o.   MRN: [id number]  HPI: Mr. David LeedsJohn O [name] is a 42 year old male who returns for follow up appointment for chronic pain and medication refill. He states his pain is located in his neck, mid- lower back radiating upwards into thoracis and radiating into his bilateral lower extremities L>R, right hip and bilateral feet. He rates his pain 6. His current exercise regime is 6. His current exercise regime is walking and performing stretching exercises.   [name] is 69.00 MME. Last Oral Swab was Performed on 05/20/2018 it was consistent.    Pain Inventory Average Pain 5 Pain Right Now 6 My pain is intermittent, constant, sharp, burning, dull, stabbing, tingling and aching  In the last 24 hours, has pain interfered with the following? General activity 6 Relation with others 5 Enjoyment of life 7 What TIME of day is your pain at its worst? all Sleep (in general) Fair  Pain is worse with: walking, bending, sitting, inactivity, standing and some activites Pain improves with: rest, heat/ice, therapy/exercise, pacing activities and medication Relief from Meds: 4  Mobility walk without assistance how many minutes can you walk? 60 ability to climb steps?  yes do you drive?  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  Neuro/Psych numbness tingling spasms depression anxiety  Prior Studies Any changes since last visit?  no  Physicians involved in your care Any changes since last visit?  no   Family History  Problem Relation Age of Onset  . Arthritis Mother   . Heart disease Mother   . Diabetes Mother   . Heart disease Father   . Diabetes Father    Social History   Socioeconomic History  . Marital status: Married    Spouse name: Not on file  . Number of children: Not on file  . Years of education: Not on file  . Highest education level: Not on file    Occupational History  . Not on file  Social Needs  . Financial resource strain: Not on file  . Food insecurity:    Worry: Not on file    Inability: Not on file  . Transportation needs:    Medical: Not on file    Non-medical: Not on file  Tobacco Use  . Smoking status: Never Smoker  . Smokeless tobacco: Never Used  Substance and Sexual Activity  . Alcohol use: No  . Drug use: No  . Sexual activity: Not on file  Lifestyle  . Physical activity:    Days per week: Not on file    Minutes per session: Not on file  . Stress: Not on file  Relationships  . Social connections:    Talks on phone: Not on file    Gets together: Not on file    Attends religious service: Not on file    Active member of club or organization: Not on file    Attends meetings of clubs or organizations: Not on file    Relationship status: Not on file  Other Topics Concern  . Not on file  Social History Narrative  . Not on file   History reviewed. No pertinent surgical history. Past Medical History:  Diagnosis Date  . Chronic pain syndrome   . 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 Lumbago   . Pain in joint, lower leg   .  Postlaminectomy syndrome, cervical region   . Primary localized osteoarthrosis, lower leg   . Sciatica   . Thoracic radiculopathy    BP 118/71 (BP Location: Left Arm, Patient Position: Sitting, Cuff Size: Normal)   Pulse 86   Resp 16   Ht 6\' 3"  (1.905 m)   Wt 216 lb (98 kg)   SpO2 98%   BMI 27.00 kg/m   Opioid Risk Score:   Fall Risk Score:  `1  Depression screen PHQ 2/9  Depression screen [facility] 2/9 06/24/2018 01/20/2018 05/27/2017 02/23/2017 12/29/2016 01/02/2016 06/13/2015  Decreased Interest 3 1 1 1 1 1  0  Down, Depressed, Hopeless 3 1 1 1 1 1 1   PHQ - 2 Score 6 2 2 2 2 2 1   Altered sleeping 3 - - - - 2 -  Tired, decreased energy 3 - - - - 2 -  Change in appetite 0 - - - - 0 -  Feeling bad or failure about yourself  0 - - - - 0 -  Trouble  concentrating 0 - - - - 0 -  Moving slowly or fidgety/restless 0 - - - - 0 -  Suicidal thoughts 0 - - - - 0 -  PHQ-9 Score 12 - - - - 6 -  Difficult doing work/chores Not difficult at all - - - - Somewhat difficult -    Review of Systems  Constitutional: Negative.   HENT: Negative.   Eyes: Negative.   Respiratory: Negative.   Cardiovascular: Negative.   Gastrointestinal: Negative.   Endocrine: Negative.   Genitourinary: Negative.   Musculoskeletal: Positive for back pain and neck pain.       Spasms   Skin: Negative.   Allergic/Immunologic: Negative.   Neurological: Negative.        Tingling  Hematological: Negative.   Psychiatric/Behavioral: Positive for dysphoric mood. The patient is nervous/anxious.        Objective:   Physical Exam  Constitutional: He is oriented to person, place, and time. He appears well-developed and well-nourished.  HENT:  Head: Normocephalic and atraumatic.  Neck: Normal range of motion. Neck supple.  Cervical Paraspinal Tenderness: C-5-C-6 Mainly Right Side  Cardiovascular: Normal rate and regular rhythm.  Pulmonary/Chest: Effort normal and breath sounds normal.  Musculoskeletal:  Normal Muscle Bulk and Muscle Testing Reveals: Upper Extremities: Full ROM and Muscle Strength 5/5 Bilateral AC Joint Tenderness: R>L Thoracic Paraspinal Tenderness: T-3-T-7 T-9-T-11 Lumbar Paraspinal Tenderness: L-3-L-5 Right Greater Trochanter Tenderness Lower Extremities: Full ROM and Muscle Strength 5/5 Bilateral Lower Extremities Flexion Produces Pain into Bilateral Lower Extremities Arises from chair with ease Narrow Based Gait   Neurological: He is alert and oriented to person, place, and time.  Skin: Skin is warm and dry.  Psychiatric: He has a normal mood and affect. His behavior is normal.  Nursing note and vitals reviewed.         Assessment & Plan:  1. Cervical postlaminectomy syndrome, Hx of ACDF C4-5, C6-7, C7-T1, 02/2008. Continue current  medication and alternate using heat and ice therapy. 07/22/2018 2. Spondylosis of L-spine . Continue current treatment regime. 07/22/2018 3. Chronic postoperative pain:Refilled:Xtampza 18 mg one capsule every 12 hours for pain #60 andOxycodone 5/325mg  one tablet twice a day #60. Continue Voltaren Gel/ Flector Patch. 07/22/2018. We will continue the opioid monitoring program, this consists of regular clinic visits, examinations, urine drug screen, pill counts as well as use of [place] Controlled Substance Reporting System. 4. Diabetes with neuropathy. Continuecurrent medication regimen withGabapentin.Keep blood  sugars under tight control. Continue to monitor.07/22/2018. 5.Cervicalgia/Cervical Radiculopathy/ ThoracicRadiculopathy/ LumbarRadiculopathy:Continue Gabapentin.07/22/2018 6. Greater Right Trochanter Bursitis: Continue Ice/Heat and Current medication regimen.07/22/2018 7. Chronic Pain Syndrome/ Myofascial Pain: Continue Flector Patch/ Alternates with Voltaren Gel. 09//20/2019  20 minutes of face to face patient care time was spent during this visit. All questions were encouraged and answered.  F/U in 1 month

## 2018-08-16 ENCOUNTER — Encounter: Payer: BLUE CROSS/BLUE SHIELD | Attending: Physical Medicine & Rehabilitation | Admitting: Registered Nurse

## 2018-08-16 ENCOUNTER — Encounter: Payer: Self-pay | Admitting: Registered Nurse

## 2018-08-16 ENCOUNTER — Other Ambulatory Visit: Payer: Self-pay

## 2018-08-16 VITALS — BP 111/73 | HR 81 | Ht 75.0 in | Wt 218.0 lb

## 2018-08-16 DIAGNOSIS — M419 Scoliosis, unspecified: Secondary | ICD-10-CM

## 2018-08-16 DIAGNOSIS — Z760 Encounter for issue of repeat prescription: Secondary | ICD-10-CM | POA: Diagnosis not present

## 2018-08-16 DIAGNOSIS — G609 Hereditary and idiopathic neuropathy, unspecified: Secondary | ICD-10-CM

## 2018-08-16 DIAGNOSIS — M5134 Other intervertebral disc degeneration, thoracic region: Secondary | ICD-10-CM | POA: Insufficient documentation

## 2018-08-16 DIAGNOSIS — M5414 Radiculopathy, thoracic region: Secondary | ICD-10-CM | POA: Diagnosis not present

## 2018-08-16 DIAGNOSIS — G8929 Other chronic pain: Secondary | ICD-10-CM | POA: Diagnosis not present

## 2018-08-16 DIAGNOSIS — M5412 Radiculopathy, cervical region: Secondary | ICD-10-CM

## 2018-08-16 DIAGNOSIS — M179 Osteoarthritis of knee, unspecified: Secondary | ICD-10-CM | POA: Diagnosis not present

## 2018-08-16 DIAGNOSIS — M5135 Other intervertebral disc degeneration, thoracolumbar region: Secondary | ICD-10-CM | POA: Diagnosis not present

## 2018-08-16 DIAGNOSIS — G894 Chronic pain syndrome: Secondary | ICD-10-CM

## 2018-08-16 DIAGNOSIS — M5489 Other dorsalgia: Secondary | ICD-10-CM | POA: Diagnosis not present

## 2018-08-16 DIAGNOSIS — M5430 Sciatica, unspecified side: Secondary | ICD-10-CM | POA: Diagnosis not present

## 2018-08-16 DIAGNOSIS — M47896 Other spondylosis, lumbar region: Secondary | ICD-10-CM | POA: Insufficient documentation

## 2018-08-16 DIAGNOSIS — Z5181 Encounter for therapeutic drug level monitoring: Secondary | ICD-10-CM

## 2018-08-16 DIAGNOSIS — M542 Cervicalgia: Secondary | ICD-10-CM

## 2018-08-16 DIAGNOSIS — M7061 Trochanteric bursitis, right hip: Secondary | ICD-10-CM

## 2018-08-16 DIAGNOSIS — Z79899 Other long term (current) drug therapy: Secondary | ICD-10-CM

## 2018-08-16 DIAGNOSIS — M47817 Spondylosis without myelopathy or radiculopathy, lumbosacral region: Secondary | ICD-10-CM

## 2018-08-16 DIAGNOSIS — M5416 Radiculopathy, lumbar region: Secondary | ICD-10-CM

## 2018-08-16 DIAGNOSIS — M79669 Pain in unspecified lower leg: Secondary | ICD-10-CM | POA: Diagnosis not present

## 2018-08-16 DIAGNOSIS — M961 Postlaminectomy syndrome, not elsewhere classified: Secondary | ICD-10-CM | POA: Insufficient documentation

## 2018-08-16 DIAGNOSIS — E119 Type 2 diabetes mellitus without complications: Secondary | ICD-10-CM | POA: Insufficient documentation

## 2018-08-16 DIAGNOSIS — M7918 Myalgia, other site: Secondary | ICD-10-CM

## 2018-08-16 MED ORDER — OXYCODONE ER 18 MG PO C12A: 1.0000 | EXTENDED_RELEASE_CAPSULE | Freq: Two times a day (BID) | ORAL | 0 refills | Status: DC

## 2018-08-16 MED ORDER — OXYCODONE-ACETAMINOPHEN 5-325 MG PO TABS: 1.0000 | ORAL_TABLET | Freq: Two times a day (BID) | ORAL | 0 refills | Status: DC

## 2018-08-16 NOTE — Progress Notes (Signed)
Subjective:    Patient ID: David [name], male    DOB: 1976/08/08, 42 y.o.   MRN: [id number]  HPI: Mr. KALIX MEINECKE is a 42 year old male who returns for follow up appointment for chronic pain and medication refill. He states his pain is located in his neck radiating into his left shoulder, mid- lower back radiating upwards and right hip pain. He rates his pain 6. His current exercise regime is walking.   Mr. Greenslade Morphine Equivalent is 69.00 MME. Last Oral Swab was Performed on 05/20/2018, it was consistent.   Pain Inventory Average Pain 5 Pain Right Now 6 My pain is intermittent, sharp, burning, dull, stabbing, tingling and aching  In the last 24 hours, has pain interfered with the following? General activity 5 Relation with others 6 Enjoyment of life 6 What TIME of day is your pain at its worst? all Sleep (in general) Fair  Pain is worse with: walking, bending, sitting, inactivity, unsure and some activites Pain improves with: rest, heat/ice, therapy/exercise, pacing activities and medication Relief from Meds: 4  Mobility walk without assistance how many minutes can you walk? 45 ability to climb steps?  yes do you drive?  yes  Function employed # of hrs/week 50  Neuro/Psych numbness tingling spasms depression anxiety  Prior Studies Any changes since last visit?  no  Physicians involved in your care Any changes since last visit?  no   Family History  Problem Relation Age of Onset  . Arthritis Mother   . Heart disease Mother   . Diabetes Mother   . Heart disease Father   . Diabetes Father    Social History   Socioeconomic History  . Marital status: Married    Spouse name: Not on file  . Number of children: Not on file  . Years of education: Not on file  . Highest education level: Not on file  Occupational History  . Not on file  Social Needs  . Financial resource strain: Not on file  . Food insecurity:    Worry: Not on file    Inability: Not  on file  . Transportation needs:    Medical: Not on file    Non-medical: Not on file  Tobacco Use  . Smoking status: Never Smoker  . Smokeless tobacco: Never Used  Substance and Sexual Activity  . Alcohol use: No  . Drug use: No  . Sexual activity: Not on file  Lifestyle  . Physical activity:    Days per week: Not on file    Minutes per session: Not on file  . Stress: Not on file  Relationships  . Social connections:    Talks on phone: Not on file    Gets together: Not on file    Attends religious service: Not on file    Active member of club or organization: Not on file    Attends meetings of clubs or organizations: Not on file    Relationship status: Not on file  Other Topics Concern  . Not on file  Social History Narrative  . Not on file   No past surgical history on file. Past Medical History:  Diagnosis Date  . Chronic pain syndrome   . 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 Lumbago   . Pain in joint, lower leg   . Postlaminectomy syndrome, cervical region   . Primary localized osteoarthrosis, lower leg   . Sciatica   . Thoracic radiculopathy  BP 111/73   Pulse 81   Ht 6\' 3"  (1.905 m)   Wt 218 lb (98.9 kg)   SpO2 98%   BMI 27.25 kg/m   Opioid Risk Score:   Fall Risk Score:  `1  Depression screen PHQ 2/9  Depression screen [facility] 2/9 08/16/2018 06/24/2018 01/20/2018 05/27/2017 02/23/2017 12/29/2016 01/02/2016  Decreased Interest 1 3 1 1 1 1 1   Down, Depressed, Hopeless 1 3 1 1 1 1 1   PHQ - 2 Score 2 6 2 2 2 2 2   Altered sleeping - 3 - - - - 2  Tired, decreased energy - 3 - - - - 2  Change in appetite - 0 - - - - 0  Feeling bad or failure about yourself  - 0 - - - - 0  Trouble concentrating - 0 - - - - 0  Moving slowly or fidgety/restless - 0 - - - - 0  Suicidal thoughts - 0 - - - - 0  PHQ-9 Score - 12 - - - - 6  Difficult doing work/chores - Not difficult at all - - - - Somewhat difficult    Review of Systems    Constitutional: Negative.   HENT: Negative.   Eyes: Negative.   Respiratory: Negative.   Cardiovascular: Negative.   Gastrointestinal: Negative.   Endocrine: Negative.   Genitourinary: Negative.   Musculoskeletal: Negative.   Skin: Negative.   Allergic/Immunologic: Negative.   Neurological: Negative.   Hematological: Negative.   Psychiatric/Behavioral: Negative.   All other systems reviewed and are negative.      Objective:   Physical Exam  Constitutional: He is oriented to person, place, and time. He appears well-developed and well-nourished.  HENT:  Head: Normocephalic and atraumatic.  Neck: Normal range of motion. Neck supple.  Cervical Paraspinal Tenderness: C-5-C-6  Cardiovascular: Normal rate and regular rhythm.  Pulmonary/Chest: Effort normal and breath sounds normal.  Musculoskeletal:  Normal Muscle Bulk and Muscle Testing Reveals: Upper Extremities: Full ROM and Muscle Strength 5/5 Left AC Joint Tenderness Thoracic Paraspinal Tenderness: T-4-T-6 Lumbar Paraspinal Tenderness: L-3-L-5 Lower Extremities: Full ROM and Muscle Strength 5/5 Arises from Table with Ease  Narrow Based Gait  Neurological: He is alert and oriented to person, place, and time.  Skin: Skin is warm and dry.  Psychiatric: He has a normal mood and affect. His behavior is normal.  Nursing note and vitals reviewed.         Assessment & Plan:  1. Cervical postlaminectomy syndrome, Hx of ACDF C4-5, C6-7, C7-T1, 02/2008. Continue current medication and alternate using heat and ice therapy. 08/16/2018 2. Spondylosis of L-spine . Continue current treatment regime. 08/16/2018 3. Chronic postoperative pain:Refilled:Xtampza 18 mg one capsule every 12 hours for pain #60 andOxycodone 5/325mg  one tablet twice a day #60. Continue Voltaren Gel/ Flector Patch. 08/16/2018. We will continue the opioid monitoring program, this consists of regular clinic visits, examinations, urine drug screen, pill counts  as well as use of [place] Controlled Substance Reporting System. 4. Diabetes with neuropathy. Continuecurrent medication regimen withGabapentin.Keep blood sugars under tight control. Continue to monitor.08/16/2018. 5.Cervicalgia/Cervical Radiculopathy/ ThoracicRadiculopathy/ LumbarRadiculopathy:Continue Gabapentin.08/16/2018 6. Right Greater Trochanter Bursitis: Continue Ice/Heat and Current medication regimen.08/16/2018 7. Chronic Pain Syndrome/ Myofascial Pain: Continue Flector Patch/ Alternates with Voltaren Gel. 10//15/2019  20 minutes of face to face patient care time was spent during this visit. All questions were encouraged and answered.  F/U in 1 month

## 2018-08-23 ENCOUNTER — Telehealth: Payer: Self-pay | Admitting: Physical Medicine & Rehabilitation

## 2018-08-23 NOTE — Telephone Encounter (Signed)
pI called patient to change his appt with Riley Lam and he had a question about his Xtampza.  He is saying that the pharmacy is telling him it needs a prior authorization.  Patient said he left a message on the clinic line at 10am, but hasn't heard anything.  Please call patient.

## 2018-08-25 NOTE — Telephone Encounter (Signed)
Prior authorization submitted. Contacted patient. Left vm

## 2018-09-15 ENCOUNTER — Encounter: Payer: Self-pay | Admitting: Registered Nurse

## 2018-09-15 ENCOUNTER — Encounter: Payer: BLUE CROSS/BLUE SHIELD | Attending: Physical Medicine & Rehabilitation | Admitting: Registered Nurse

## 2018-09-15 VITALS — BP 105/69 | HR 91 | Ht 73.0 in | Wt 218.0 lb

## 2018-09-15 DIAGNOSIS — M79669 Pain in unspecified lower leg: Secondary | ICD-10-CM | POA: Diagnosis not present

## 2018-09-15 DIAGNOSIS — Z5181 Encounter for therapeutic drug level monitoring: Secondary | ICD-10-CM

## 2018-09-15 DIAGNOSIS — M179 Osteoarthritis of knee, unspecified: Secondary | ICD-10-CM | POA: Diagnosis not present

## 2018-09-15 DIAGNOSIS — Z760 Encounter for issue of repeat prescription: Secondary | ICD-10-CM | POA: Insufficient documentation

## 2018-09-15 DIAGNOSIS — M5430 Sciatica, unspecified side: Secondary | ICD-10-CM | POA: Insufficient documentation

## 2018-09-15 DIAGNOSIS — M25571 Pain in right ankle and joints of right foot: Secondary | ICD-10-CM

## 2018-09-15 DIAGNOSIS — M5416 Radiculopathy, lumbar region: Secondary | ICD-10-CM

## 2018-09-15 DIAGNOSIS — G894 Chronic pain syndrome: Secondary | ICD-10-CM

## 2018-09-15 DIAGNOSIS — M5134 Other intervertebral disc degeneration, thoracic region: Secondary | ICD-10-CM | POA: Insufficient documentation

## 2018-09-15 DIAGNOSIS — M47817 Spondylosis without myelopathy or radiculopathy, lumbosacral region: Secondary | ICD-10-CM

## 2018-09-15 DIAGNOSIS — M5412 Radiculopathy, cervical region: Secondary | ICD-10-CM

## 2018-09-15 DIAGNOSIS — M47896 Other spondylosis, lumbar region: Secondary | ICD-10-CM | POA: Diagnosis not present

## 2018-09-15 DIAGNOSIS — M5489 Other dorsalgia: Secondary | ICD-10-CM | POA: Insufficient documentation

## 2018-09-15 DIAGNOSIS — G609 Hereditary and idiopathic neuropathy, unspecified: Secondary | ICD-10-CM

## 2018-09-15 DIAGNOSIS — E119 Type 2 diabetes mellitus without complications: Secondary | ICD-10-CM | POA: Insufficient documentation

## 2018-09-15 DIAGNOSIS — M542 Cervicalgia: Secondary | ICD-10-CM

## 2018-09-15 DIAGNOSIS — Z79899 Other long term (current) drug therapy: Secondary | ICD-10-CM

## 2018-09-15 DIAGNOSIS — M419 Scoliosis, unspecified: Secondary | ICD-10-CM

## 2018-09-15 DIAGNOSIS — G8929 Other chronic pain: Secondary | ICD-10-CM | POA: Diagnosis not present

## 2018-09-15 DIAGNOSIS — M961 Postlaminectomy syndrome, not elsewhere classified: Secondary | ICD-10-CM | POA: Insufficient documentation

## 2018-09-15 DIAGNOSIS — M7061 Trochanteric bursitis, right hip: Secondary | ICD-10-CM

## 2018-09-15 DIAGNOSIS — M5414 Radiculopathy, thoracic region: Secondary | ICD-10-CM | POA: Insufficient documentation

## 2018-09-15 DIAGNOSIS — M5135 Other intervertebral disc degeneration, thoracolumbar region: Secondary | ICD-10-CM | POA: Insufficient documentation

## 2018-09-15 DIAGNOSIS — M7918 Myalgia, other site: Secondary | ICD-10-CM

## 2018-09-15 MED ORDER — OXYCODONE-ACETAMINOPHEN 5-325 MG PO TABS: 1.0000 | ORAL_TABLET | Freq: Two times a day (BID) | ORAL | 0 refills | Status: DC

## 2018-09-15 MED ORDER — OXYCODONE ER 18 MG PO C12A: 1.0000 | EXTENDED_RELEASE_CAPSULE | Freq: Two times a day (BID) | ORAL | 0 refills | Status: DC

## 2018-09-15 NOTE — Progress Notes (Signed)
Subjective:    Patient ID: David [name], male    DOB: Oct 15, 1976, 42 y.o.   MRN: [id number]  HPI: David [name] is a 42 year old male who returns for follow up appointment for chronic pain and medication refill. She states her pain is located in his neck radiating into his left arm he reports, mid- lower back radiating into his bilateral lower extremities and right ankle pain. He rates his pain 6. His current exercise regime is walking and performing stretching exercises.   David [name] Morphine Equivalent is 69.00 MME. Last Oral Swab was Performed on 05/20/18, it was consistent.   Pain Inventory Average Pain 5 Pain Right Now 6 My pain is intermittent, constant, sharp, burning, dull, stabbing, tingling and aching  In the last 24 hours, has pain interfered with the following? General activity 6 Relation with others 6 Enjoyment of life 6 What TIME of day is your pain at its worst? all Sleep (in general) Fair  Pain is worse with: walking, bending, sitting, inactivity, standing and some activites Pain improves with: rest, heat/ice, therapy/exercise, pacing activities and medication Relief from Meds: 4  Mobility walk without assistance ability to climb steps?  yes do you drive?  yes  Function employed # of hrs/week 50  Neuro/Psych numbness tingling spasms depression anxiety  Prior Studies Any changes since last visit?  no  Physicians involved in your care Any changes since last visit?  no   Family History  Problem Relation Age of Onset  . Arthritis Mother   . Heart disease Mother   . Diabetes Mother   . Heart disease Father   . Diabetes Father    Social History   Socioeconomic History  . Marital status: Married    Spouse name: Not on file  . Number of children: Not on file  . Years of education: Not on file  . Highest education level: Not on file  Occupational History  . Not on file  Social Needs  . Financial resource strain: Not on file  . Food  insecurity:    Worry: Not on file    Inability: Not on file  . Transportation needs:    Medical: Not on file    Non-medical: Not on file  Tobacco Use  . Smoking status: Never Smoker  . Smokeless tobacco: Never Used  Substance and Sexual Activity  . Alcohol use: No  . Drug use: No  . Sexual activity: Not on file  Lifestyle  . Physical activity:    Days per week: Not on file    Minutes per session: Not on file  . Stress: Not on file  Relationships  . Social connections:    Talks on phone: Not on file    Gets together: Not on file    Attends religious service: Not on file    Active member of club or organization: Not on file    Attends meetings of clubs or organizations: Not on file    Relationship status: Not on file  Other Topics Concern  . Not on file  Social History Narrative  . Not on file   No past surgical history on file. Past Medical History:  Diagnosis Date  . Chronic pain syndrome   . 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 Lumbago   . Pain in joint, lower leg   . Postlaminectomy syndrome, cervical region   . Primary localized osteoarthrosis, lower leg   . Sciatica   .  Thoracic radiculopathy    BP 105/69   Pulse 91   Ht 6\' 1"  (1.854 m)   Wt 218 lb (98.9 kg)   SpO2 96%   BMI 28.76 kg/m   Opioid Risk Score:   Fall Risk Score:  `1  Depression screen PHQ 2/9  Depression screen [facility] 2/[facility] 08/16/2018 06/24/2018 01/20/2018 05/27/2017 02/23/2017 12/29/2016 01/02/2016  Decreased Interest 1 3 1 1 1 1 1   Down, Depressed, Hopeless 1 3 1 1 1 1 1   PHQ - 2 Score 2 6 2 2 2 2 2   Altered sleeping - 3 - - - - 2  Tired, decreased energy - 3 - - - - 2  Change in appetite - 0 - - - - 0  Feeling bad or failure about yourself  - 0 - - - - 0  Trouble concentrating - 0 - - - - 0  Moving slowly or fidgety/restless - 0 - - - - 0  Suicidal thoughts - 0 - - - - 0  PHQ-9 Score - 12 - - - - 6  Difficult doing work/chores - Not difficult at all - - - -  Somewhat difficult     Review of Systems  Constitutional: Negative.   HENT: Negative.   Eyes: Negative.   Respiratory: Negative.   Cardiovascular: Negative.   Gastrointestinal: Negative.   Endocrine: Negative.   Genitourinary: Negative.   Musculoskeletal: Positive for arthralgias, back pain and myalgias.  Skin: Negative.   Allergic/Immunologic: Negative.   Neurological: Positive for numbness.  Hematological: Negative.   Psychiatric/Behavioral: Positive for dysphoric mood. The patient is nervous/anxious.   All other systems reviewed and are negative.      Objective:   Physical Exam  Constitutional: He is oriented to person, place, and time. He appears well-developed and well-nourished.  HENT:  Head: Normocephalic and atraumatic.  Neck: Normal range of motion. Neck supple.  Cervical Paraspinal Tenderness: C-5-C-6  Cardiovascular: Normal rate and regular rhythm.  Pulmonary/Chest: Effort normal and breath sounds normal.  Musculoskeletal:  Normal Muscle Bulk and Muscle Testing Reveals: Upper Extremities: Full ROM and Muscle Strength 5/5 Bilateral AC Joint Tenderness Thoracic and Lumbar Hypersensitivity Lower Extremities: Full ROM and Muscle Strength 5/5 Bilateral Lower Extremities Flexion Produces Pain into Bilateral Lower Extremities Arises from Table with ease Narrow Based gait  Neurological: He is alert and oriented to person, place, and time.  Skin: Skin is warm and dry.  Psychiatric: He has a normal mood and affect. His behavior is normal.  Nursing note and vitals reviewed.         Assessment & Plan:  1. Cervical postlaminectomy syndrome, Hx of ACDF C4-5, C6-7, C7-T1, 02/2008. Continue current medication and alternate using heat and ice therapy. 09/15/2018 2. Spondylosis of L-spine . Continue current treatment regime. 09/15/2018 3. Chronic postoperative pain:Refilled:Xtampza 18 mg one capsule every 12 hours for pain #60 andOxycodone 5/325mg  one tablet twice a  day #60. Continue Voltaren Gel/ Flector Patch. 09/15/2018. We will continue the opioid monitoring program, this consists of regular clinic visits, examinations, urine drug screen, pill counts as well as use of [place]. 4. Diabetes with neuropathy. Continuecurrent medication regimen withGabapentin.Keep blood sugars under tight control. Continue to monitor.09/15/2018. 5.Cervicalgia/Cervical Radiculopathy/ ThoracicRadiculopathy/ LumbarRadiculopathy:Continue Gabapentin.09/15/2018 6. Right Greater Trochanter Bursitis: Continue Ice/Heat and Current medication regimen.09/15/2018 7. Chronic Pain Syndrome/ Myofascial Pain: Continue Flector Patch/ Alternates with Voltaren Gel. 11//14/2019 8. Right Ankle Pain: He hasn't went for X-ray at this time. Encouraged to obtain X-ray,  he verbalizes understanding.   20 minutes of face to face patient care time was spent during this visit. All questions were encouraged and answered.  F/U in 1 month

## 2018-09-16 ENCOUNTER — Ambulatory Visit: Payer: BLUE CROSS/BLUE SHIELD | Admitting: Registered Nurse

## 2018-09-27 ENCOUNTER — Telehealth: Payer: Self-pay | Admitting: *Deleted

## 2018-09-27 NOTE — Telephone Encounter (Signed)
Patient left a message for David [name] asking for a work note for his employer explaining that he needs to wear comfortable shoes given his condition. Dress shoes are the policy and they are too hard on his feet and body.  He is wanting a variance that allows him to wear sneakers or something with more comfort.

## 2018-10-04 NOTE — Telephone Encounter (Signed)
Return David [name] call regarding work note, left a message. Awaiting a return call.

## 2018-10-06 ENCOUNTER — Telehealth: Payer: Self-pay

## 2018-10-06 MED ORDER — GABAPENTIN 100 MG PO CAPS: ORAL_CAPSULE | ORAL | 1 refills | Status: DC

## 2018-10-06 MED ORDER — GABAPENTIN 400 MG PO CAPS: ORAL_CAPSULE | ORAL | 1 refills | Status: DC

## 2018-10-06 NOTE — Telephone Encounter (Signed)
Patient called in a refill for gabapentin and to cancel a note that was asked for on his last encounter, meds refilled and sent to pharmacy

## 2018-10-07 ENCOUNTER — Encounter: Payer: Self-pay | Admitting: Physical Medicine & Rehabilitation

## 2018-10-07 ENCOUNTER — Telehealth: Payer: Self-pay | Admitting: *Deleted

## 2018-10-07 NOTE — Telephone Encounter (Signed)
Mr David [name] is requesting David [name] write a letter for his work explaining that he needs to be able to wear comfortable shoes as he has to walk quite a bit with job.  The dress code requires that he wear dress shoes.  He has diabetes and neuropathy and his back issues.

## 2018-10-12 NOTE — Telephone Encounter (Signed)
Letter written, placed at front desk, called and left message for patient

## 2018-10-17 ENCOUNTER — Ambulatory Visit: Payer: BLUE CROSS/BLUE SHIELD | Admitting: Physical Medicine & Rehabilitation

## 2018-10-21 ENCOUNTER — Encounter: Payer: BLUE CROSS/BLUE SHIELD | Attending: Physical Medicine & Rehabilitation

## 2018-10-21 ENCOUNTER — Encounter: Payer: Self-pay | Admitting: Physical Medicine & Rehabilitation

## 2018-10-21 ENCOUNTER — Ambulatory Visit: Payer: BLUE CROSS/BLUE SHIELD | Admitting: Physical Medicine & Rehabilitation

## 2018-10-21 VITALS — BP 122/77 | HR 80 | Ht 74.0 in | Wt 218.0 lb

## 2018-10-21 DIAGNOSIS — M5414 Radiculopathy, thoracic region: Secondary | ICD-10-CM | POA: Diagnosis not present

## 2018-10-21 DIAGNOSIS — G8929 Other chronic pain: Secondary | ICD-10-CM | POA: Diagnosis not present

## 2018-10-21 DIAGNOSIS — M179 Osteoarthritis of knee, unspecified: Secondary | ICD-10-CM | POA: Insufficient documentation

## 2018-10-21 DIAGNOSIS — M47896 Other spondylosis, lumbar region: Secondary | ICD-10-CM | POA: Diagnosis not present

## 2018-10-21 DIAGNOSIS — M25579 Pain in unspecified ankle and joints of unspecified foot: Secondary | ICD-10-CM | POA: Diagnosis not present

## 2018-10-21 DIAGNOSIS — M542 Cervicalgia: Secondary | ICD-10-CM

## 2018-10-21 DIAGNOSIS — M961 Postlaminectomy syndrome, not elsewhere classified: Secondary | ICD-10-CM

## 2018-10-21 DIAGNOSIS — Z760 Encounter for issue of repeat prescription: Secondary | ICD-10-CM | POA: Insufficient documentation

## 2018-10-21 DIAGNOSIS — E119 Type 2 diabetes mellitus without complications: Secondary | ICD-10-CM | POA: Insufficient documentation

## 2018-10-21 DIAGNOSIS — M5489 Other dorsalgia: Secondary | ICD-10-CM | POA: Insufficient documentation

## 2018-10-21 DIAGNOSIS — M79669 Pain in unspecified lower leg: Secondary | ICD-10-CM | POA: Diagnosis not present

## 2018-10-21 DIAGNOSIS — M5430 Sciatica, unspecified side: Secondary | ICD-10-CM | POA: Insufficient documentation

## 2018-10-21 DIAGNOSIS — M5134 Other intervertebral disc degeneration, thoracic region: Secondary | ICD-10-CM | POA: Insufficient documentation

## 2018-10-21 DIAGNOSIS — M5135 Other intervertebral disc degeneration, thoracolumbar region: Secondary | ICD-10-CM | POA: Diagnosis not present

## 2018-10-21 MED ORDER — OXYCODONE-ACETAMINOPHEN 5-325 MG PO TABS: 1.0000 | ORAL_TABLET | Freq: Two times a day (BID) | ORAL | 0 refills | Status: DC

## 2018-10-21 MED ORDER — OXYCODONE ER 18 MG PO C12A: 1.0000 | EXTENDED_RELEASE_CAPSULE | Freq: Two times a day (BID) | ORAL | 0 refills | Status: DC

## 2018-10-21 NOTE — Progress Notes (Signed)
Subjective:    Patient ID: David LeedsJohn O Vejar, male    DOB: 1976/06/07, 42 y.o.   MRN: [id number]  HPI Has new insurance doesn't want trigger point injections until pre authorized  Stiffness in neck has increased over the last several months.  Pain occasionally to Right shoulder if head is tilted to the right side Has some numbness between shoulder blades when looking upward  Bilateral foot numbness left greater than right side this is chronic.  His diabetes has been well controlled for several years now Pain Inventory Average Pain 5 Pain Right Now 6 My pain is intermittent, constant, sharp, burning, dull, stabbing, tingling and aching  In the last 24 hours, has pain interfered with the following? General activity 5 Relation with others 6 Enjoyment of life 6 What TIME of day is your pain at its worst? all Sleep (in general) Fair  Pain is worse with: walking, bending, sitting, inactivity, standing and some activites Pain improves with: rest, heat/ice, therapy/exercise, pacing activities and medication Relief from Meds: 4  Mobility walk without assistance ability to climb steps?  yes do you drive?  yes  Function employed # of hrs/week 50  Neuro/Psych numbness tingling spasms depression anxiety  Prior Studies Any changes since last visit?  no  Physicians involved in your care Any changes since last visit?  no   Family History  Problem Relation Age of Onset  . Arthritis Mother   . Heart disease Mother   . Diabetes Mother   . Heart disease Father   . Diabetes Father    Social History   Socioeconomic History  . Marital status: Married    Spouse name: Not on file  . Number of children: Not on file  . Years of education: Not on file  . Highest education level: Not on file  Occupational History  . Not on file  Social Needs  . Financial resource strain: Not on file  . Food insecurity:    Worry: Not on file    Inability: Not on file  . Transportation needs:      Medical: Not on file    Non-medical: Not on file  Tobacco Use  . Smoking status: Never Smoker  . Smokeless tobacco: Never Used  Substance and Sexual Activity  . Alcohol use: No  . Drug use: No  . Sexual activity: Not on file  Lifestyle  . Physical activity:    Days per week: Not on file    Minutes per session: Not on file  . Stress: Not on file  Relationships  . Social connections:    Talks on phone: Not on file    Gets together: Not on file    Attends religious service: Not on file    Active member of club or organization: Not on file    Attends meetings of clubs or organizations: Not on file    Relationship status: Not on file  Other Topics Concern  . Not on file  Social History Narrative  . Not on file   No past surgical history on file. Past Medical History:  Diagnosis Date  . Chronic pain syndrome   . 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 Lumbago   . Pain in joint, lower leg   . Postlaminectomy syndrome, cervical region   . Primary localized osteoarthrosis, lower leg   . Sciatica   . Thoracic radiculopathy    BP 122/77   Pulse 80   Ht 6\' 2"  (1.88 m)  Wt 218 lb (98.9 kg)   SpO2 99%   BMI 27.99 kg/m   Opioid Risk Score:   Fall Risk Score:  `1  Depression screen PHQ 2/9  [facility] 08/16/2018 06/24/2018 01/20/2018 05/27/2017 02/23/2017 12/29/2016 01/02/2016  Decreased Interest 1 3 1 1 1 1 1   Down, Depressed, Hopeless 1 3 1 1 1 1 1   PHQ - 2 Score 2 6 2 2 2 2 2   Altered sleeping - 3 - - - - 2  Tired, decreased energy - 3 - - - - 2  Change in appetite - 0 - - - - 0  Feeling bad or failure about yourself  - 0 - - - - 0  Trouble concentrating - 0 - - - - 0  Moving slowly or fidgety/restless - 0 - - - - 0  Suicidal thoughts - 0 - - - - 0  PHQ-9 Score - 12 - - - - 6  Difficult doing work/chores - Not difficult at all - - - - Somewhat difficult     Review of Systems  Constitutional: Negative.   HENT: Negative.    Eyes: Negative.   Respiratory: Negative.   Cardiovascular: Negative.   Gastrointestinal: Negative.   Endocrine: Negative.   Genitourinary: Negative.   Musculoskeletal: Positive for arthralgias, back pain, myalgias and neck pain.  Skin: Negative.   Allergic/Immunologic: Negative.   Neurological: Positive for numbness.  Hematological: Negative.   Psychiatric/Behavioral: Positive for dysphoric mood. The patient is nervous/anxious.   All other systems reviewed and are negative.      Objective:   Physical Exam Constitutional:      Appearance: Normal appearance.  Neurological:     Mental Status: He is alert and oriented to person, place, and time.     Deep Tendon Reflexes:     Reflex Scores:      Tricep reflexes are 0 on the right side and 0 on the left side.      Bicep reflexes are 0 on the right side and 0 on the left side.      Brachioradialis reflexes are 0 on the right side and 0 on the left side.      Patellar reflexes are 0 on the right side and 0 on the left side.      Achilles reflexes are 0 on the right side and 0 on the left side.   Tenderness to palpation right upper trapezius as well as cervical paraspinal area C5 and above Negative impingement test right shoulder Motor strength is 5/5 bilateral deltoid bicep tricep grip hip flexor knee extensor ankle dorsiflexor Sensation reduced bilateral feet at the toes he is insensate to light touch on the left side and has reduced sensation on the right side he does have sensation in the supra malleoli are area on the left side to light touch.  His proprioception is diminished bilateral great toes Ambulates with side assistive device no evidence of toe drag or knee instability there is no evidence of ataxia       Assessment & Plan:  1.  Cervicalgia, + foraminal compression test on Right side with intermittenet position dependant R C5 radicular symptoms but no other focal neuro deficits.  Will check Xray  The patient has a  history of lower cervical upper thoracic ACDF.  His current symptoms are likely adjacent level degeneration above the fusion. We discussed physical therapy which he thinks may be difficult for him since he just started a new job  He states he does have some acupuncture benefits with his new employer based health plan.  He will check into the details.  We also discussed trigger point injections as another potential option for the myofascial component to his pain

## 2018-10-21 NOTE — Addendum Note (Signed)
Addended by: Erick ColaceKIRSTEINS, ANDREW E on: 10/21/2018 04:03 PM   Modules accepted: Level of Service

## 2018-10-21 NOTE — Patient Instructions (Signed)
Neck Exercises  Neck exercises can be important for many reasons:   They can help you to improve and maintain flexibility in your neck. This can be especially important as you age.   They can help to make your neck stronger. This can make movement easier.   They can reduce or prevent neck pain.   They may help your upper back.  Ask your health care provider which neck exercises would be best for you.  Exercises to improve neck flexibility  Neck stretch  Repeat this exercise 3-5 times.  1. Do this exercise while standing or while sitting in a chair.  2. Place your feet flat on the floor, shoulder-width apart.  3. Slowly turn your head to the right. Turn it all the way to the right so you can look over your right shoulder. Do not tilt or tip your head.  4. Hold this position for 10-30 seconds.  5. Slowly turn your head to the left, to look over your left shoulder.  6. Hold this position for 10-30 seconds.    Neck retraction  Repeat this exercise 8-10 times. Do this 3-4 times a day or as told by your health care provider.  1. Do this exercise while standing or while sitting in a sturdy chair.  2. Look straight ahead. Do not bend your neck.  3. Use your fingers to push your chin backward. Do not bend your neck for this movement. Continue to face straight ahead. If you are doing the exercise properly, you will feel a slight sensation in your throat and a stretch at the back of your neck.  4. Hold the stretch for 1-2 seconds. Relax and repeat.  Exercises to improve neck strength  Neck press  Repeat this exercise 10 times. Do it first thing in the morning and right before bed or as told by your health care provider.  1. Lie on your back on a firm bed or on the floor with a pillow under your head.  2. Use your neck muscles to push your head down on the pillow and straighten your spine.  3. Hold the position as well as you can. Keep your head facing up and your chin tucked.  4. Slowly count to 5 while holding this  position.  5. Relax for a few seconds. Then repeat.  Isometric strengthening  Do a full set of these exercises 2 times a day or as told by your health care provider.  1. Sit in a supportive chair and place your hand on your forehead.  2. Push forward with your head and neck while pushing back with your hand. Hold for 10 seconds.  3. Relax. Then repeat the exercise 3 times.  4. Next, do thesequence again, this time putting your hand against the back of your head. Use your head and neck to push backward against the hand pressure.  5. Finally, do the same exercise on either side of your head, pushing sideways against the pressure of your hand.  Prone head lifts  Repeat this exercise 5 times. Do this 2 times a day or as told by your health care provider.  1. Lie face-down, resting on your elbows so that your chest and upper back are raised.  2. Start with your head facing downward, near your chest. Position your chin either on or near your chest.  3. Slowly lift your head upward. Lift until you are looking straight ahead. Then continue lifting your head as far back as you   can stretch.  4. Hold your head up for 5 seconds. Then slowly lower it to your starting position.  Supine head lifts  Repeat this exercise 8-10 times. Do this 2 times a day or as told by your health care provider.  1. Lie on your back, bending your knees to point to the ceiling and keeping your feet flat on the floor.  2. Lift your head slowly off the floor, raising your chin toward your chest.  3. Hold for 5 seconds.  4. Relax and repeat.  Scapular retraction  Repeat this exercise 5 times. Do this 2 times a day or as told by your health care provider.  1. Stand with your arms at your sides. Look straight ahead.  2. Slowly pull both shoulders backward and downward until you feel a stretch between your shoulder blades in your upper back.  3. Hold for 10-30 seconds.  4. Relax and repeat.  Contact a health care provider if:   Your neck pain or  discomfort gets much worse when you do an exercise.   Your neck pain or discomfort does not improve within 2 hours after you exercise.  If you have any of these problems, stop exercising right away. Do not do the exercises again unless your health care provider says that you can.  Get help right away if:   You develop sudden, severe neck pain. If this happens, stop exercising right away. Do not do the exercises again unless your health care provider says that you can.  This information is not intended to replace advice given to you by your health care provider. Make sure you discuss any questions you have with your health care provider.  Document Released: 09/30/2015 Document Revised: 02/22/2018 Document Reviewed: 04/29/2015  [employer] Interactive Patient Education  2019 [employer]

## 2018-11-18 ENCOUNTER — Ambulatory Visit: Payer: BLUE CROSS/BLUE SHIELD | Admitting: Physical Medicine & Rehabilitation

## 2018-11-18 ENCOUNTER — Encounter: Payer: BLUE CROSS/BLUE SHIELD | Attending: Physical Medicine & Rehabilitation

## 2018-11-18 ENCOUNTER — Encounter: Payer: Self-pay | Admitting: Physical Medicine & Rehabilitation

## 2018-11-18 VITALS — BP 134/82 | HR 89 | Resp 14 | Ht 75.0 in | Wt 218.0 lb

## 2018-11-18 DIAGNOSIS — M961 Postlaminectomy syndrome, not elsewhere classified: Secondary | ICD-10-CM | POA: Diagnosis not present

## 2018-11-18 DIAGNOSIS — M179 Osteoarthritis of knee, unspecified: Secondary | ICD-10-CM | POA: Diagnosis not present

## 2018-11-18 DIAGNOSIS — M5134 Other intervertebral disc degeneration, thoracic region: Secondary | ICD-10-CM | POA: Insufficient documentation

## 2018-11-18 DIAGNOSIS — M5489 Other dorsalgia: Secondary | ICD-10-CM | POA: Diagnosis not present

## 2018-11-18 DIAGNOSIS — M47896 Other spondylosis, lumbar region: Secondary | ICD-10-CM | POA: Diagnosis not present

## 2018-11-18 DIAGNOSIS — M5430 Sciatica, unspecified side: Secondary | ICD-10-CM | POA: Diagnosis not present

## 2018-11-18 DIAGNOSIS — E119 Type 2 diabetes mellitus without complications: Secondary | ICD-10-CM | POA: Diagnosis not present

## 2018-11-18 DIAGNOSIS — G8929 Other chronic pain: Secondary | ICD-10-CM | POA: Diagnosis not present

## 2018-11-18 DIAGNOSIS — M5135 Other intervertebral disc degeneration, thoracolumbar region: Secondary | ICD-10-CM | POA: Insufficient documentation

## 2018-11-18 DIAGNOSIS — Z760 Encounter for issue of repeat prescription: Secondary | ICD-10-CM | POA: Insufficient documentation

## 2018-11-18 DIAGNOSIS — E0842 Diabetes mellitus due to underlying condition with diabetic polyneuropathy: Secondary | ICD-10-CM

## 2018-11-18 DIAGNOSIS — M79669 Pain in unspecified lower leg: Secondary | ICD-10-CM | POA: Insufficient documentation

## 2018-11-18 DIAGNOSIS — M5414 Radiculopathy, thoracic region: Secondary | ICD-10-CM | POA: Insufficient documentation

## 2018-11-18 MED ORDER — OXYCODONE-ACETAMINOPHEN 5-325 MG PO TABS: 1.0000 | ORAL_TABLET | Freq: Two times a day (BID) | ORAL | 0 refills | Status: DC

## 2018-11-18 MED ORDER — OXYCODONE ER 18 MG PO C12A: 1.0000 | EXTENDED_RELEASE_CAPSULE | Freq: Two times a day (BID) | ORAL | 0 refills | Status: DC

## 2018-11-18 NOTE — Progress Notes (Signed)
Subjective:    Patient ID: David [name], male    DOB: September 12, 1976, 43 y.o.   MRN: [id number]  HPI  43 year old male with history of cervical postlaminectomy syndrome as well as thoracic spondylosis.  His chronic pain in the cervical and thoracic area Pain in neck radiating down to the left arm, to elbow The patient has not followed up with x-rays of his cervical spine.  He has had no progression in his symptoms no problem with walking no bowel or bladder dysfunction.  No new motor complaints.  Also has chronic numbness in Left hand, history of severe diabetic neuropathy   Pain Inventory Average Pain 5 Pain Right Now 5 My pain is intermittent, constant, sharp, burning, dull, stabbing, tingling and aching  In the last 24 hours, has pain interfered with the following? General activity 5 Relation with others 5 Enjoyment of life 7 What TIME of day is your pain at its worst? all Sleep (in general) Fair  Pain is worse with: walking, bending, sitting, inactivity, standing and some activites Pain improves with: rest, heat/ice, therapy/exercise, pacing activities and medication Relief from Meds: 4  Mobility walk without assistance how many minutes can you walk? 45 ability to climb steps?  yes do you drive?  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  no  Physicians involved in your care Any changes since last visit?  no   Family History  Problem Relation Age of Onset  . Arthritis Mother   . Heart disease Mother   . Diabetes Mother   . Heart disease Father   . Diabetes Father    Social History   Socioeconomic History  . Marital status: Married    Spouse name: Not on file  . Number of children: Not on file  . Years of education: Not on file  . Highest education level: Not on file  Occupational History  . Not on file  Social Needs  .  Financial resource strain: Not on file  . Food insecurity:    Worry: Not on file    Inability: Not on file  . Transportation needs:    Medical: Not on file    Non-medical: Not on file  Tobacco Use  . Smoking status: Never Smoker  . Smokeless tobacco: Never Used  Substance and Sexual Activity  . Alcohol use: No  . Drug use: No  . Sexual activity: Not on file  Lifestyle  . Physical activity:    Days per week: Not on file    Minutes per session: Not on file  . Stress: Not on file  Relationships  . Social connections:    Talks on phone: Not on file    Gets together: Not on file    Attends religious service: Not on file    Active member of club or organization: Not on file    Attends meetings of clubs or organizations: Not on file    Relationship status: Not on file  Other Topics Concern  . Not on file  Social History Narrative  . Not on file   History reviewed. No pertinent surgical history. Past Medical History:  Diagnosis Date  . Chronic pain syndrome   . 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 Lumbago   . Pain in joint, lower leg   . Postlaminectomy syndrome, cervical region   . Primary localized  osteoarthrosis, lower leg   . Sciatica   . Thoracic radiculopathy    BP 134/82   Pulse 89   Resp 14   Ht 6\' 3"  (1.905 m)   Wt 218 lb (98.9 kg)   SpO2 95%   BMI 27.25 kg/m   Opioid Risk Score:   Fall Risk Score:  `1  Depression screen PHQ 2/9  [facility] 08/16/2018 06/24/2018 01/20/2018 05/27/2017 02/23/2017 12/29/2016 01/02/2016  Decreased Interest 1 3 1 1 1 1 1   Down, Depressed, Hopeless 1 3 1 1 1 1 1   PHQ - 2 Score 2 6 2 2 2 2 2   Altered sleeping - 3 - - - - 2  Tired, decreased energy - 3 - - - - 2  Change in appetite - 0 - - - - 0  Feeling bad or failure about yourself  - 0 - - - - 0  Trouble concentrating - 0 - - - - 0  Moving slowly or fidgety/restless - 0 - - - - 0  Suicidal thoughts - 0 - - - - 0  PHQ-9 Score - 12 -  - - - 6  Difficult doing work/chores - Not difficult at all - - - - Somewhat difficult   Review of Systems  Constitutional: Negative.   HENT: Negative.   Eyes: Negative.   Respiratory: Negative.   Cardiovascular: Negative.   Gastrointestinal: Negative.   Endocrine: Negative.   Genitourinary: Negative.   Musculoskeletal: Positive for arthralgias, back pain and myalgias.       Spasms   Skin: Negative.   Allergic/Immunologic: Negative.   Neurological: Positive for numbness.       Tingling   Hematological: Negative.   Psychiatric/Behavioral: Positive for dysphoric mood. The patient is nervous/anxious.   All other systems reviewed and are negative.      Objective:   Physical Exam Vitals signs and nursing note reviewed.  Constitutional:      Appearance: Normal appearance.  HENT:     Head: Normocephalic and atraumatic.     Nose: No congestion or rhinorrhea.     Mouth/Throat:     Mouth: Mucous membranes are moist.  Eyes:     General: No scleral icterus.    Conjunctiva/sclera: Conjunctivae normal.  Neck:     Musculoskeletal: No muscular tenderness.  Neurological:     Mental Status: He is alert and oriented to person, place, and time.     Comments: Motor strength is 5/5 bilateral deltoid, bicep, tricep, grip, hip flexor, knee extensor, ankle dorsiflexor.  Psychiatric:        Mood and Affect: Mood normal.        Behavior: Behavior normal.     Reduced sensation in the left fourth and fifth digits There is mild hyperthenar atrophy Cervical spine range of motion is 50% with right sided rotation 75% to the left 50% cervical flexion and extension. Ambulates without assistive device no evidence of toe drag or knee instability Mood and affect are appropriate      Assessment & Plan:   1.  Cervical postlaminectomy syndrome and chronic neck pain.  He has had some exacerbation of symptoms and some C5 distribution pain pattern but no focal neurologic deficits. He does have  history of diabetic neuropathy as well as probable chronic left C8 radiculopathy. Symptoms are nonprogressive patient has not followed through with recommendations for cervical spine x-ray.  I have asked him to do that before his next visit. Last UDS 05/14/2018 will need  1 next visit Continue Xtampza ER 18 mg twice a day Continue oxycodone 5 mg twice a day Primary care prescribing diazepam 5 mg 1 or 2 times per day has been on this chronically patient aware of potential interaction 2.  Diabetic neuropathy continue good diabetic control his hemoglobin A1c is now less than 6

## 2018-11-18 NOTE — Patient Instructions (Signed)
Please get cervical spine xray

## 2018-12-16 ENCOUNTER — Other Ambulatory Visit: Payer: Self-pay

## 2018-12-16 ENCOUNTER — Encounter: Payer: BLUE CROSS/BLUE SHIELD | Attending: Physical Medicine & Rehabilitation | Admitting: Registered Nurse

## 2018-12-16 ENCOUNTER — Encounter: Payer: Self-pay | Admitting: Registered Nurse

## 2018-12-16 VITALS — BP 125/80 | HR 79 | Ht 75.0 in | Wt 217.6 lb

## 2018-12-16 DIAGNOSIS — M7061 Trochanteric bursitis, right hip: Secondary | ICD-10-CM

## 2018-12-16 DIAGNOSIS — M5414 Radiculopathy, thoracic region: Secondary | ICD-10-CM | POA: Diagnosis not present

## 2018-12-16 DIAGNOSIS — E119 Type 2 diabetes mellitus without complications: Secondary | ICD-10-CM | POA: Insufficient documentation

## 2018-12-16 DIAGNOSIS — G609 Hereditary and idiopathic neuropathy, unspecified: Secondary | ICD-10-CM

## 2018-12-16 DIAGNOSIS — M5489 Other dorsalgia: Secondary | ICD-10-CM | POA: Diagnosis not present

## 2018-12-16 DIAGNOSIS — G894 Chronic pain syndrome: Secondary | ICD-10-CM

## 2018-12-16 DIAGNOSIS — Z79899 Other long term (current) drug therapy: Secondary | ICD-10-CM

## 2018-12-16 DIAGNOSIS — Z5181 Encounter for therapeutic drug level monitoring: Secondary | ICD-10-CM

## 2018-12-16 DIAGNOSIS — M5430 Sciatica, unspecified side: Secondary | ICD-10-CM | POA: Insufficient documentation

## 2018-12-16 DIAGNOSIS — M5134 Other intervertebral disc degeneration, thoracic region: Secondary | ICD-10-CM | POA: Diagnosis not present

## 2018-12-16 DIAGNOSIS — G8929 Other chronic pain: Secondary | ICD-10-CM | POA: Diagnosis not present

## 2018-12-16 DIAGNOSIS — M179 Osteoarthritis of knee, unspecified: Secondary | ICD-10-CM | POA: Insufficient documentation

## 2018-12-16 DIAGNOSIS — M961 Postlaminectomy syndrome, not elsewhere classified: Secondary | ICD-10-CM | POA: Diagnosis not present

## 2018-12-16 DIAGNOSIS — M542 Cervicalgia: Secondary | ICD-10-CM

## 2018-12-16 DIAGNOSIS — M47817 Spondylosis without myelopathy or radiculopathy, lumbosacral region: Secondary | ICD-10-CM

## 2018-12-16 DIAGNOSIS — M5416 Radiculopathy, lumbar region: Secondary | ICD-10-CM

## 2018-12-16 DIAGNOSIS — M7918 Myalgia, other site: Secondary | ICD-10-CM

## 2018-12-16 DIAGNOSIS — M5135 Other intervertebral disc degeneration, thoracolumbar region: Secondary | ICD-10-CM | POA: Insufficient documentation

## 2018-12-16 DIAGNOSIS — M5412 Radiculopathy, cervical region: Secondary | ICD-10-CM

## 2018-12-16 DIAGNOSIS — M47896 Other spondylosis, lumbar region: Secondary | ICD-10-CM | POA: Diagnosis not present

## 2018-12-16 DIAGNOSIS — M79669 Pain in unspecified lower leg: Secondary | ICD-10-CM | POA: Diagnosis not present

## 2018-12-16 DIAGNOSIS — Z760 Encounter for issue of repeat prescription: Secondary | ICD-10-CM | POA: Diagnosis not present

## 2018-12-16 MED ORDER — OXYCODONE-ACETAMINOPHEN 5-325 MG PO TABS: 1.0000 | ORAL_TABLET | Freq: Two times a day (BID) | ORAL | 0 refills | Status: DC

## 2018-12-16 MED ORDER — OXYCODONE ER 18 MG PO C12A: 1.0000 | EXTENDED_RELEASE_CAPSULE | Freq: Two times a day (BID) | ORAL | 0 refills | Status: DC

## 2018-12-16 NOTE — Progress Notes (Signed)
Subjective:    Patient ID: David [name], male    DOB: February 15, 1976, 43 y.o.   MRN: [id number]  HPI: David [name] is a 43 y.o. male who returns for follow up appointment for chronic pain and medication refill. He states his  pain is located in his neck mainly left side into left shoulder, left arm with tingling and burning mid- lower back radiating into his bilateral lower extremities L>R and right hip pain.[name] He rates his pain 5. His current exercise regime is walking he states he 's walking 10,00 steps daily and performing stretching exercises.  David [name] Morphine equivalent is 69. . He is also prescribed Diazepam by Mauricio Po NP .We have discussed the black box warning of using opioids and benzodiazepines. I highlighted the dangers of using these drugs together and discussed the adverse events including respiratory suppression, overdose, cognitive impairment and importance of compliance with current regimen. We will continue to monitor and adjust as indicated.   Last Oral Swab was Performed on 05/20/2018 it was consistent.   Pain Inventory Average Pain 5 Pain Right Now 5 My pain is intermittent, constant, sharp, burning, dull, stabbing, tingling and aching  In the last 24 hours, has pain interfered with the following? General activity 5 Relation with others 6 Enjoyment of life 6 What TIME of day is your pain at its worst? all Sleep (in general) Fair  Pain is worse with: walking, bending, sitting, inactivity, standing and some activites Pain improves with: rest, heat/ice, therapy/exercise, pacing activities and medication Relief from Meds: 4  Mobility how many minutes can you walk? 45 ability to climb steps?  yes do you drive?  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  no x-rays CT/MRI nerve study  Physicians involved in your care Any  changes since last visit?  no Primary care [facility]   Family History  Problem Relation Age of Onset  . Arthritis Mother   . Heart disease Mother   . Diabetes Mother   . Heart disease Father   . Diabetes Father    Social History   Socioeconomic History  . Marital status: Married    Spouse name: Not on file  . Number of children: Not on file  . Years of education: Not on file  . Highest education level: Not on file  Occupational History  . Not on file  Social Needs  . Financial resource strain: Not on file  . Food insecurity:    Worry: Not on file    Inability: Not on file  . Transportation needs:    Medical: Not on file    Non-medical: Not on file  Tobacco Use  . Smoking status: Never Smoker  . Smokeless tobacco: Never Used  Substance and Sexual Activity  . Alcohol use: No  . Drug use: No  . Sexual activity: Not on file  Lifestyle  . Physical activity:    Days per week: Not on file    Minutes per session: Not on file  . Stress: Not on file  Relationships  . Social connections:    Talks on phone: Not on file    Gets together: Not on file    Attends religious service: Not on file    Active member of club or organization: Not on file    Attends meetings of clubs or organizations: Not on file  Relationship status: Not on file  Other Topics Concern  . Not on file  Social History Narrative  . Not on file   No past surgical history on file. Past Medical History:  Diagnosis Date  . Chronic pain syndrome   . 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 Lumbago   . Pain in joint, lower leg   . Postlaminectomy syndrome, cervical region   . Primary localized osteoarthrosis, lower leg   . Sciatica   . Thoracic radiculopathy    BP 125/80   Pulse 79   Ht 6\' 3"  (1.905 m)   Wt 217 lb 9.6 oz (98.7 kg)   SpO2 98%   BMI 27.20 kg/m   Opioid Risk Score:   Fall Risk Score:  `1  Depression screen PHQ 2/9  Dep[facility] screen [facility]  12/16/2018 08/16/2018 06/24/2018 01/20/2018 05/27/2017 02/23/2017 12/29/2016  Decreased Interest 0 1 3 1 1 1 1   Down, Depressed, Hopeless 0 1 3 1 1 1 1   PHQ - 2 Score 0 2 6 2 2 2 2   Altered sleeping - - 3 - - - -  Tired, decreased energy - - 3 - - - -  Change in appetite - - 0 - - - -  Feeling bad or failure about yourself  - - 0 - - - -  Trouble concentrating - - 0 - - - -  Moving slowly or fidgety/restless - - 0 - - - -  Suicidal thoughts - - 0 - - - -  PHQ-9 Score - - 12 - - - -  Difficult doing work/chores - - Not difficult at all - - - -     Review of Systems  Constitutional: Negative.   HENT: Negative.   Eyes: Negative.   Respiratory: Negative.   Cardiovascular: Negative.   Gastrointestinal: Negative.   Endocrine: Negative.   Genitourinary: Negative.   Musculoskeletal: Negative.   Skin: Negative.   Allergic/Immunologic: Negative.   Neurological: Negative.   Hematological: Negative.   Psychiatric/Behavioral: Negative.   All other systems reviewed and are negative.      Objective:   Physical Exam Vitals signs and nursing note reviewed.  Constitutional:      Appearance: Normal appearance.  Neck:     Musculoskeletal: Normal range of motion and neck supple.     Comments: Cervical Paraspinal Tenderness: C-5-C-6 Mainly Left Side Cardiovascular:     Rate and Rhythm: Normal rate and regular rhythm.     Pulses: Normal pulses.     Heart sounds: Normal heart sounds.  Pulmonary:     Effort: Pulmonary effort is normal.     Breath sounds: Normal breath sounds.  Musculoskeletal:     Comments: Normal Muscle Bulk and Muscle Testing Reveals:  Upper Extremities: Full ROM and Muscle Strength 5/5 Left AC Joint Tenderness Thoracic Hypersensitivity Lumbar Paraspinal Tenderness: L-4-L-5 Lower Extremities: Full ROM and Muscle Strength 5/5 Arises from Table with ease Narrow Based Gait   Skin:    General: Skin is warm and dry.  Neurological:     Mental Status: He is oriented to  person, place, and time.  Psychiatric:        Mood and Affect: Mood normal.        Behavior: Behavior normal.           Assessment & Plan:  1. Cervical postlaminectomy syndrome, Hx of ACDF C4-5, C6-7, C7-T1, 02/2008. Continue current medication and alternate using heat and ice therapy.12/16/2018 2. Spondylosis of  L-spine . Continue current treatment regime.12/16/2018 3. Chronic postoperative pain:Refilled:Xtampza 18 mg one capsule every 12 hours for pain #60 andOxycodone 5/325mg  one tablet twice a day #60. Continue Voltaren Gel/ Flector Patch. 12/16/2018. We will continue the opioid monitoring program, this consists of regular clinic visits, examinations, urine drug screen, pill counts as well as use of [place] Controlled Substance Reporting System. 4. Diabetes with neuropathy. Continuecurrent medication regimen withGabapentin.Keep blood sugars under tight control. Continue to monitor.12/16/2018 5.Cervicalgia/Cervical Radiculopathy/ ThoracicRadiculopathy/ LumbarRadiculopathy:Continue Gabapentin.12/16/2018 6.RightGreater Trochanter Bursitis: Continue Ice/Heat and Current medication regimen.12/16/2018 7. Chronic Pain Syndrome/ Myofascial Pain: Continue Flector Patch/ Alternates with Voltaren Gel. 02//14/2020  20 minutes of face to face patient care time was spent during this visit. All questions were encouraged and answered.  F/U in 1 month

## 2019-01-13 ENCOUNTER — Encounter: Payer: BLUE CROSS/BLUE SHIELD | Attending: Physical Medicine & Rehabilitation | Admitting: Registered Nurse

## 2019-01-13 ENCOUNTER — Encounter: Payer: Self-pay | Admitting: Registered Nurse

## 2019-01-13 ENCOUNTER — Other Ambulatory Visit: Payer: Self-pay

## 2019-01-13 VITALS — BP 131/77 | HR 100 | Ht 75.0 in | Wt 211.6 lb

## 2019-01-13 DIAGNOSIS — M5416 Radiculopathy, lumbar region: Secondary | ICD-10-CM

## 2019-01-13 DIAGNOSIS — M179 Osteoarthritis of knee, unspecified: Secondary | ICD-10-CM | POA: Diagnosis not present

## 2019-01-13 DIAGNOSIS — M5412 Radiculopathy, cervical region: Secondary | ICD-10-CM | POA: Diagnosis not present

## 2019-01-13 DIAGNOSIS — M5134 Other intervertebral disc degeneration, thoracic region: Secondary | ICD-10-CM | POA: Diagnosis not present

## 2019-01-13 DIAGNOSIS — M5135 Other intervertebral disc degeneration, thoracolumbar region: Secondary | ICD-10-CM | POA: Insufficient documentation

## 2019-01-13 DIAGNOSIS — Z79899 Other long term (current) drug therapy: Secondary | ICD-10-CM

## 2019-01-13 DIAGNOSIS — M79669 Pain in unspecified lower leg: Secondary | ICD-10-CM | POA: Diagnosis not present

## 2019-01-13 DIAGNOSIS — M542 Cervicalgia: Secondary | ICD-10-CM | POA: Diagnosis not present

## 2019-01-13 DIAGNOSIS — M5489 Other dorsalgia: Secondary | ICD-10-CM | POA: Insufficient documentation

## 2019-01-13 DIAGNOSIS — M5414 Radiculopathy, thoracic region: Secondary | ICD-10-CM | POA: Diagnosis not present

## 2019-01-13 DIAGNOSIS — M5430 Sciatica, unspecified side: Secondary | ICD-10-CM | POA: Diagnosis not present

## 2019-01-13 DIAGNOSIS — Z5181 Encounter for therapeutic drug level monitoring: Secondary | ICD-10-CM

## 2019-01-13 DIAGNOSIS — G8929 Other chronic pain: Secondary | ICD-10-CM | POA: Insufficient documentation

## 2019-01-13 DIAGNOSIS — G609 Hereditary and idiopathic neuropathy, unspecified: Secondary | ICD-10-CM

## 2019-01-13 DIAGNOSIS — M47896 Other spondylosis, lumbar region: Secondary | ICD-10-CM | POA: Diagnosis not present

## 2019-01-13 DIAGNOSIS — M961 Postlaminectomy syndrome, not elsewhere classified: Secondary | ICD-10-CM | POA: Insufficient documentation

## 2019-01-13 DIAGNOSIS — E119 Type 2 diabetes mellitus without complications: Secondary | ICD-10-CM | POA: Insufficient documentation

## 2019-01-13 DIAGNOSIS — M7061 Trochanteric bursitis, right hip: Secondary | ICD-10-CM

## 2019-01-13 DIAGNOSIS — M7918 Myalgia, other site: Secondary | ICD-10-CM

## 2019-01-13 DIAGNOSIS — E0842 Diabetes mellitus due to underlying condition with diabetic polyneuropathy: Secondary | ICD-10-CM

## 2019-01-13 DIAGNOSIS — M47817 Spondylosis without myelopathy or radiculopathy, lumbosacral region: Secondary | ICD-10-CM

## 2019-01-13 DIAGNOSIS — Z760 Encounter for issue of repeat prescription: Secondary | ICD-10-CM | POA: Diagnosis present

## 2019-01-13 DIAGNOSIS — G894 Chronic pain syndrome: Secondary | ICD-10-CM

## 2019-01-13 MED ORDER — OXYCODONE ER 18 MG PO C12A: 1.0000 | EXTENDED_RELEASE_CAPSULE | Freq: Two times a day (BID) | ORAL | 0 refills | Status: DC

## 2019-01-13 MED ORDER — OXYCODONE-ACETAMINOPHEN 5-325 MG PO TABS: 1.0000 | ORAL_TABLET | Freq: Two times a day (BID) | ORAL | 0 refills | Status: DC

## 2019-01-13 NOTE — Progress Notes (Signed)
Subjective:    Patient ID: David [name], male    DOB: 1976-05-18, 43 y.o.   MRN: [id number]  HPI: David [name] is a 43 y.o. male who returns for follow up appointment for chronic pain and medication refill. He states his pain is located in his neck radiating into his left arm with tingling and numbness, mid- back pain radiating into his ribs, lower back pain radiating into his bilateral lower extremities L>R. He rates his pain 6. His current exercise regime is walking and performing stretching exercises.  David [name] Morphine equivalent is 69.00 MME. He is also prescribed Diazepam by Mauricio Po Np..We have discussed the black box warning of using opioids and benzodiazepines. I highlighted the dangers of using these drugs together and discussed the adverse events including respiratory suppression, overdose, cognitive impairment and importance of compliance with current regimen. We will continue to monitor and adjust as indicated.   Last Oral Swab was Performed on 05/20/2018, it was consistent.    Pain Inventory Average Pain 5 Pain Right Now 6 My pain is constant, sharp, burning, dull, stabbing, tingling and aching  In the last 24 hours, has pain interfered with the following? General activity 7 Relation with others 6 Enjoyment of life 6 What TIME of day is your pain at its worst? all Sleep (in general) Fair  Pain is worse with: walking, bending, sitting, inactivity, standing and some activites Pain improves with: rest, heat/ice, therapy/exercise, pacing activities and medication Relief from Meds: 4  Mobility walk without assistance how many minutes can you walk? 45 ability to climb steps?  yes do you drive?  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  no  Physicians involved in your care Any changes since last visit?  no   Family History  Problem  Relation Age of Onset  . Arthritis Mother   . Heart disease Mother   . Diabetes Mother   . Heart disease Father   . Diabetes Father    Social History   Socioeconomic History  . Marital status: Married    Spouse name: Not on file  . Number of children: Not on file  . Years of education: Not on file  . Highest education level: Not on file  Occupational History  . Not on file  Social Needs  . Financial resource strain: Not on file  . Food insecurity:    Worry: Not on file    Inability: Not on file  . Transportation needs:    Medical: Not on file    Non-medical: Not on file  Tobacco Use  . Smoking status: Never Smoker  . Smokeless tobacco: Never Used  Substance and Sexual Activity  . Alcohol use: No  . Drug use: No  . Sexual activity: Not on file  Lifestyle  . Physical activity:    Days per week: Not on file    Minutes per session: Not on file  . Stress: Not on file  Relationships  . Social connections:    Talks on phone: Not on file    Gets together: Not on file    Attends religious service: Not on file    Active member of club or organization: Not on file    Attends meetings of clubs or organizations: Not on file    Relationship status: Not on file  Other Topics Concern  . Not on file  Social History Narrative  . Not on file   No past surgical history on file. Past Medical History:  Diagnosis Date  . Chronic pain syndrome   . 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 Lumbago   . Pain in joint, lower leg   . Postlaminectomy syndrome, cervical region   . Primary localized osteoarthrosis, lower leg   . Sciatica   . Thoracic radiculopathy    BP 131/77   Pulse 100   Ht 6\' 3"  (1.905 m)   Wt 211 lb 9.6 oz (96 kg)   SpO2 98%   BMI 26.45 kg/m   Opioid Risk Score:   Fall Risk Score:  `1  Depression screen PHQ 2/9  Depression screen [facility] 2/9 01/13/2019 12/16/2018 08/16/2018 06/24/2018 01/20/2018 05/27/2017 02/23/2017  Decreased Interest  1 0 1 3 1 1 1   Down, Depressed, Hopeless 1 0 1 3 1 1 1   PHQ - 2 Score 2 0 2 6 2 2 2   Altered sleeping - - - 3 - - -  Tired, decreased energy - - - 3 - - -  Change in appetite - - - 0 - - -  Feeling bad or failure about yourself  - - - 0 - - -  Trouble concentrating - - - 0 - - -  Moving slowly or fidgety/restless - - - 0 - - -  Suicidal thoughts - - - 0 - - -  PHQ-9 Score - - - 12 - - -  Difficult doing work/chores - - - Not difficult at all - - -    Review of Systems  Constitutional: Negative.   HENT: Negative.   Eyes: Negative.   Respiratory: Negative.   Cardiovascular: Negative.   Gastrointestinal: Negative.   Endocrine: Negative.   Genitourinary: Negative.   Musculoskeletal: Negative.   Skin: Negative.   Allergic/Immunologic: Negative.   Neurological: Positive for numbness.       Tingling  Hematological: Negative.   Psychiatric/Behavioral: Positive for hallucinations. The patient is nervous/anxious.        Objective:   Physical Exam Vitals signs and nursing note reviewed.  Constitutional:      Appearance: Normal appearance.  Neck:     Musculoskeletal: Normal range of motion and neck supple.  Cardiovascular:     Rate and Rhythm: Normal rate and regular rhythm.     Pulses: Normal pulses.     Heart sounds: Normal heart sounds.  Pulmonary:     Effort: Pulmonary effort is normal.     Breath sounds: Normal breath sounds.  Musculoskeletal:     Comments: Normal Muscle Bulk and Muscle Testing Reveals:  Upper Extremities: Full  ROM and Muscle Strength 5/5 Right AC Joint Tenderness  Thoracic Paraspinal Tenderness: T-3-T-7  Lumbar Paraspinal Tenderness: L-4-L-5 Right Greater Trochanter Tenderness Lower Extremities: Full ROM and Muscle Strength 5/5 Arises from Table with ease Narrow Based Gait   Skin:    General: Skin is warm and dry.  Neurological:     Mental Status: He is alert and oriented to person, place, and time.  Psychiatric:        Mood and Affect: Mood  normal.        Behavior: Behavior normal.           Assessment & Plan:  1. Cervical postlaminectomy syndrome, Hx of ACDF C4-5, C6-7, C7-T1, 02/2008. Continue current medication and alternate using heat and ice therapy.01/13/2019 2. Spondylosis of L-spine . Continue current treatment regime.01/13/2019 3. Chronic postoperative  pain:Refilled:Xtampza 18 mg one capsule every 12 hours for pain #60 andOxycodone 5/325mg  one tablet twice a day #60. Continue Voltaren Gel/ Flector Patch. 01/13/2019. We will continue the opioid monitoring program, this consists of regular clinic visits, examinations, urine drug screen, pill counts as well as [place]. 4. Diabetes with neuropathy. Continuecurrent medication regimen withGabapentin.Keep blood sugars under tight control. Continue to monitor.01/13/2019 5.Cervicalgia/Cervical Radiculopathy/ ThoracicRadiculopathy/ LumbarRadiculopathy:Continue Gabapentin.01/13/2019 6.RightGreater Trochanter Bursitis: Continue Ice/Heat and Current medication regimen.01/13/2019 7. Chronic Pain Syndrome/ Myofascial Pain: Continue Flector Patch/ Alternates with Voltaren Gel. 03//13/2020  20 minutes of face to face patient care time was spent during this visit. All questions were encouraged and answered.  F/U in 1 month

## 2019-01-26 ENCOUNTER — Telehealth: Payer: Self-pay | Admitting: *Deleted

## 2019-01-26 MED ORDER — GABAPENTIN 100 MG PO CAPS: ORAL_CAPSULE | ORAL | 1 refills | Status: DC

## 2019-01-26 NOTE — Telephone Encounter (Signed)
Mr David [name] called for a refill on his gabapentin 90 day for qid dosing.  He says pharmacy has the tid 90 day but not for the qid.  Can you send this in?

## 2019-01-26 NOTE — Telephone Encounter (Signed)
His 100 mg gabapentin needed to be chaged to qid to match the 400 mg dose which is taken together.  Sent new order to pharmacy and notified Mr Goodnow.

## 2019-02-13 ENCOUNTER — Encounter: Payer: BLUE CROSS/BLUE SHIELD | Attending: Physical Medicine & Rehabilitation | Admitting: Registered Nurse

## 2019-02-13 ENCOUNTER — Encounter: Payer: Self-pay | Admitting: Registered Nurse

## 2019-02-13 ENCOUNTER — Other Ambulatory Visit: Payer: Self-pay

## 2019-02-13 VITALS — Ht 75.0 in | Wt 211.0 lb

## 2019-02-13 DIAGNOSIS — M546 Pain in thoracic spine: Secondary | ICD-10-CM | POA: Diagnosis not present

## 2019-02-13 DIAGNOSIS — Z760 Encounter for issue of repeat prescription: Secondary | ICD-10-CM | POA: Insufficient documentation

## 2019-02-13 DIAGNOSIS — G8929 Other chronic pain: Secondary | ICD-10-CM | POA: Insufficient documentation

## 2019-02-13 DIAGNOSIS — M79669 Pain in unspecified lower leg: Secondary | ICD-10-CM | POA: Insufficient documentation

## 2019-02-13 DIAGNOSIS — Z79899 Other long term (current) drug therapy: Secondary | ICD-10-CM

## 2019-02-13 DIAGNOSIS — M5412 Radiculopathy, cervical region: Secondary | ICD-10-CM | POA: Diagnosis not present

## 2019-02-13 DIAGNOSIS — M179 Osteoarthritis of knee, unspecified: Secondary | ICD-10-CM | POA: Insufficient documentation

## 2019-02-13 DIAGNOSIS — M5414 Radiculopathy, thoracic region: Secondary | ICD-10-CM | POA: Insufficient documentation

## 2019-02-13 DIAGNOSIS — M7918 Myalgia, other site: Secondary | ICD-10-CM

## 2019-02-13 DIAGNOSIS — Z5181 Encounter for therapeutic drug level monitoring: Secondary | ICD-10-CM

## 2019-02-13 DIAGNOSIS — M7061 Trochanteric bursitis, right hip: Secondary | ICD-10-CM

## 2019-02-13 DIAGNOSIS — M5134 Other intervertebral disc degeneration, thoracic region: Secondary | ICD-10-CM | POA: Insufficient documentation

## 2019-02-13 DIAGNOSIS — M47817 Spondylosis without myelopathy or radiculopathy, lumbosacral region: Secondary | ICD-10-CM

## 2019-02-13 DIAGNOSIS — M961 Postlaminectomy syndrome, not elsewhere classified: Secondary | ICD-10-CM | POA: Diagnosis not present

## 2019-02-13 DIAGNOSIS — M5489 Other dorsalgia: Secondary | ICD-10-CM | POA: Insufficient documentation

## 2019-02-13 DIAGNOSIS — M5416 Radiculopathy, lumbar region: Secondary | ICD-10-CM

## 2019-02-13 DIAGNOSIS — M542 Cervicalgia: Secondary | ICD-10-CM

## 2019-02-13 DIAGNOSIS — M5430 Sciatica, unspecified side: Secondary | ICD-10-CM | POA: Insufficient documentation

## 2019-02-13 DIAGNOSIS — M5135 Other intervertebral disc degeneration, thoracolumbar region: Secondary | ICD-10-CM | POA: Insufficient documentation

## 2019-02-13 DIAGNOSIS — E119 Type 2 diabetes mellitus without complications: Secondary | ICD-10-CM | POA: Insufficient documentation

## 2019-02-13 DIAGNOSIS — G894 Chronic pain syndrome: Secondary | ICD-10-CM

## 2019-02-13 DIAGNOSIS — G609 Hereditary and idiopathic neuropathy, unspecified: Secondary | ICD-10-CM

## 2019-02-13 DIAGNOSIS — M47896 Other spondylosis, lumbar region: Secondary | ICD-10-CM | POA: Insufficient documentation

## 2019-02-13 MED ORDER — OXYCODONE-ACETAMINOPHEN 5-325 MG PO TABS: 1.0000 | ORAL_TABLET | Freq: Two times a day (BID) | ORAL | 0 refills | Status: DC

## 2019-02-13 MED ORDER — OXYCODONE ER 18 MG PO C12A: 1.0000 | EXTENDED_RELEASE_CAPSULE | Freq: Two times a day (BID) | ORAL | 0 refills | Status: DC

## 2019-02-13 NOTE — Progress Notes (Signed)
Subjective:    Patient ID: David [name], male    DOB: 03-Sep-1976, 43 y.o.   MRN: [id number]  HPI: David [name] is a 43 y.o. male his appointment was changed, due to national recommendations of social distancing due to COVID 19, an audio/video telehealth visit is felt to be most appropriate for this patient at this time.  See Chart message from today for the patient's consent to telehealth from [facility].     He states his pain is located in his neck mainly right side radiating into his left arm with tingling, mid- lower back radiating into his bilateral  lower extremities L>R. He rates his pain 5. His current exercise regime is walking and performing stretching exercises.  Mr. Eurich Morphine equivalent is 69.00 MME.  Last Oral Swab was Performed on 05/20/2018, it was consistent.   Angela Nevin CMA asked the Health and History Questions. This provider and Purvis Sheffield  verified we were  speaking with the correct person using two identifiers.  Pain Inventory Average Pain 5 Pain Right Now 5 My pain is constant, sharp, burning, dull, stabbing, tingling and aching  In the last 24 hours, has pain interfered with the following? General activity 5 Relation with others 5 Enjoyment of life 5 What TIME of day is your pain at its worst? all Sleep (in general) Fair  Pain is worse with: walking, bending, sitting, inactivity, standing and some activites Pain improves with: rest, heat/ice, therapy/exercise, pacing activities, medication, TENS and injections Relief from Meds: 4  Mobility walk without assistance how many minutes can you walk? 40 ability to climb steps?  yes do you drive?  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  Neuro/Psych numbness tingling spasms depression anxiety  Prior Studies Any changes since last visit?  no  Physicians involved in your care Any changes since last visit?  no   Family History   Problem Relation Age of Onset  . Arthritis Mother   . Heart disease Mother   . Diabetes Mother   . Heart disease Father   . Diabetes Father    Social History   Socioeconomic History  . Marital status: Married    Spouse name: Not on file  . Number of children: Not on file  . Years of education: Not on file  . Highest education level: Not on file  Occupational History  . Not on file  Social Needs  . Financial resource strain: Not on file  . Food insecurity:    Worry: Not on file    Inability: Not on file  . Transportation needs:    Medical: Not on file    Non-medical: Not on file  Tobacco Use  . Smoking status: Never Smoker  . Smokeless tobacco: Never Used  Substance and Sexual Activity  . Alcohol use: No  . Drug use: No  . Sexual activity: Not on file  Lifestyle  . Physical activity:    Days per week: Not on file    Minutes per session: Not on file  . Stress: Not on file  Relationships  . Social connections:    Talks on phone: Not on file    Gets together: Not on file    Attends religious service: Not on file    Active member of club or organization: Not on file    Attends meetings of clubs or organizations: Not on file    Relationship status: Not on  file  Other Topics Concern  . Not on file  Social History Narrative  . Not on file   History reviewed. No pertinent surgical history. Past Medical History:  Diagnosis Date  . Chronic pain syndrome   . 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 Lumbago   . Pain in joint, lower leg   . Postlaminectomy syndrome, cervical region   . Primary localized osteoarthrosis, lower leg   . Sciatica   . Thoracic radiculopathy    Ht 6\' 3"  (1.905 m)   Wt 211 lb (95.7 kg)   BMI 26.37 kg/m   Opioid Risk Score:   Fall Risk Score:  `1  Depression screen PHQ 2/9  Depression screen [facility] 01/13/2019 12/16/2018 08/16/2018 06/24/2018 01/20/2018 05/27/2017 02/23/2017  Decreased Interest 1 0 1 3 1 1 1    Down, Depressed, Hopeless 1 0 1 3 1 1 1   PHQ - 2 Score 2 0 2 6 2 2 2   Altered sleeping - - - 3 - - -  Tired, decreased energy - - - 3 - - -  Change in appetite - - - 0 - - -  Feeling bad or failure about yourself  - - - 0 - - -  Trouble concentrating - - - 0 - - -  Moving slowly or fidgety/restless - - - 0 - - -  Suicidal thoughts - - - 0 - - -  PHQ-9 Score - - - 12 - - -  Difficult doing work/chores - - - Not difficult at all - - -    Review of Systems  Constitutional: Negative.   HENT: Negative.   Eyes: Negative.   Respiratory: Negative.   Cardiovascular: Negative.   Gastrointestinal: Negative.   Endocrine: Negative.   Genitourinary: Negative.   Musculoskeletal: Positive for back pain, neck pain and neck stiffness.       Spasms  Skin: Negative.   Neurological: Positive for weakness and numbness.       Tingling   Psychiatric/Behavioral: Positive for dysphoric mood. The patient is nervous/anxious.   All other systems reviewed and are negative.      Objective:   Physical Exam        Assessment & Plan:  1. Cervical postlaminectomy syndrome, Hx of ACDF C4-5, C6-7, C7-T1, 02/2008. Continue current medication and alternate using heat and ice therapy.02/13/2019 2. Spondylosis of L-spine . Continue current treatment regime.01/13/2019 3. Chronic postoperative pain:Refilled:Xtampza 18 mg one capsule every 12 hours for pain #60 andOxycodone 5/325mg  one tablet twice a day #60. Continue Voltaren Gel/ Flector Patch. 02/13/2019. We will continue the opioid monitoring program, this consists of regular clinic visits, examinations, urine drug screen, pill counts as well as use of [place] Cont[place] Sub[place]. 4. Diabetes with neuropathy. Continuecurrent medication regimen withGabapentin.Keep blood sugars under tight control. Continue to monitor.02/13/2019 5.Cervicalgia/Cervical Radiculopathy/ ThoracicRadiculopathy/ LumbarRadiculopathy:Continue  Gabapentin.01/13/2019 6.RightGreater Trochanter Bursitis: Continue Ice/Heat and Current medication regimen.02/13/2019 7. Chronic Pain Syndrome/ Myofascial Pain: Continue Flector Patch/ Alternates with Voltaren Gel. 04//13/2020  F/U in 1 month Telephone Call  Location of patient: At Work Location of provider: Office Established patient Time spent on call: 15 minutes

## 2019-03-03 ENCOUNTER — Encounter: Payer: Self-pay | Admitting: Registered Nurse

## 2019-03-03 ENCOUNTER — Encounter: Payer: BLUE CROSS/BLUE SHIELD | Attending: Physical Medicine & Rehabilitation | Admitting: Registered Nurse

## 2019-03-03 ENCOUNTER — Other Ambulatory Visit: Payer: Self-pay

## 2019-03-03 VITALS — Ht 75.0 in | Wt 215.0 lb

## 2019-03-03 DIAGNOSIS — M5134 Other intervertebral disc degeneration, thoracic region: Secondary | ICD-10-CM | POA: Insufficient documentation

## 2019-03-03 DIAGNOSIS — M546 Pain in thoracic spine: Secondary | ICD-10-CM | POA: Diagnosis not present

## 2019-03-03 DIAGNOSIS — M47896 Other spondylosis, lumbar region: Secondary | ICD-10-CM | POA: Insufficient documentation

## 2019-03-03 DIAGNOSIS — M542 Cervicalgia: Secondary | ICD-10-CM | POA: Diagnosis not present

## 2019-03-03 DIAGNOSIS — M5414 Radiculopathy, thoracic region: Secondary | ICD-10-CM

## 2019-03-03 DIAGNOSIS — E119 Type 2 diabetes mellitus without complications: Secondary | ICD-10-CM | POA: Insufficient documentation

## 2019-03-03 DIAGNOSIS — M961 Postlaminectomy syndrome, not elsewhere classified: Secondary | ICD-10-CM

## 2019-03-03 DIAGNOSIS — G894 Chronic pain syndrome: Secondary | ICD-10-CM

## 2019-03-03 DIAGNOSIS — M5412 Radiculopathy, cervical region: Secondary | ICD-10-CM

## 2019-03-03 DIAGNOSIS — G609 Hereditary and idiopathic neuropathy, unspecified: Secondary | ICD-10-CM

## 2019-03-03 DIAGNOSIS — Z79899 Other long term (current) drug therapy: Secondary | ICD-10-CM

## 2019-03-03 DIAGNOSIS — G8929 Other chronic pain: Secondary | ICD-10-CM

## 2019-03-03 DIAGNOSIS — M5135 Other intervertebral disc degeneration, thoracolumbar region: Secondary | ICD-10-CM | POA: Insufficient documentation

## 2019-03-03 DIAGNOSIS — M47817 Spondylosis without myelopathy or radiculopathy, lumbosacral region: Secondary | ICD-10-CM

## 2019-03-03 DIAGNOSIS — M7918 Myalgia, other site: Secondary | ICD-10-CM

## 2019-03-03 DIAGNOSIS — M5430 Sciatica, unspecified side: Secondary | ICD-10-CM | POA: Insufficient documentation

## 2019-03-03 DIAGNOSIS — Z760 Encounter for issue of repeat prescription: Secondary | ICD-10-CM | POA: Insufficient documentation

## 2019-03-03 DIAGNOSIS — M179 Osteoarthritis of knee, unspecified: Secondary | ICD-10-CM | POA: Insufficient documentation

## 2019-03-03 DIAGNOSIS — M5416 Radiculopathy, lumbar region: Secondary | ICD-10-CM

## 2019-03-03 DIAGNOSIS — M5489 Other dorsalgia: Secondary | ICD-10-CM | POA: Insufficient documentation

## 2019-03-03 DIAGNOSIS — M79669 Pain in unspecified lower leg: Secondary | ICD-10-CM | POA: Insufficient documentation

## 2019-03-03 DIAGNOSIS — Z5181 Encounter for therapeutic drug level monitoring: Secondary | ICD-10-CM

## 2019-03-03 MED ORDER — OXYCODONE ER 18 MG PO C12A: 1.0000 | EXTENDED_RELEASE_CAPSULE | Freq: Two times a day (BID) | ORAL | 0 refills | Status: DC

## 2019-03-03 MED ORDER — OXYCODONE-ACETAMINOPHEN 5-325 MG PO TABS: 1.0000 | ORAL_TABLET | Freq: Two times a day (BID) | ORAL | 0 refills | Status: DC

## 2019-03-03 NOTE — Progress Notes (Signed)
Subjective:    Patient ID: David [name], male    DOB: 10-05-1976, 43 y.o.   MRN: [id number]  HPI: David [name] is a 43 y.o. male his appointment was changed, due to national recommendations of social distancing due to COVID 19, an audio/video telehealth visit is felt to be most appropriate for this patient at this time.  See Chart message from today for the patient's consent to telehealth from [facility].     He states his pain is located in his neck radiating into his left arm with tingling and burning, mid back pain radiating upward and lower back pain radiating into his bilateral lower extremities L>R. He rates his pain 5. His  current exercise regime is walking and performing stretching exercises.  Mr. [place] Morphine equivalent is 69. . Last Oral Swab was Performed on 05/20/2018, it was consistent.   Silas Sacramento CMA asked the Health and History Questions. This provider and Wyatt Portela verified we were speaking with the correct person using two identifiers.   Pain Inventory Average Pain 5 Pain Right Now 5 My pain is sharp, burning, dull, stabbing, tingling and aching  In the last 24 hours, has pain interfered with the following? General activity 5 Relation with others 5 Enjoyment of life 5 What TIME of day is your pain at its worst? all Sleep (in general) Fair  Pain is worse with: walking, bending, sitting, inactivity, standing and some activites Pain improves with: rest, heat/ice, therapy/exercise, pacing activities and medication Relief from Meds: 4  Mobility walk without assistance how many minutes can you walk? 40 ability to climb steps?  yes do you drive?  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  no  Physicians involved in your care Any changes since last visit?  no   Family History   Problem Relation Age of Onset  . Arthritis Mother   . Heart disease Mother   . Diabetes Mother   . Heart disease Father   . Diabetes Father    Social History   Socioeconomic History  . Marital status: Married    Spouse name: Not on file  . Number of children: Not on file  . Years of education: Not on file  . Highest education level: Not on file  Occupational History  . Not on file  Social Needs  . Financial resource strain: Not on file  . Food insecurity:    Worry: Not on file    Inability: Not on file  . Transportation needs:    Medical: Not on file    Non-medical: Not on file  Tobacco Use  . Smoking status: Never Smoker  . Smokeless tobacco: Never Used  Substance and Sexual Activity  . Alcohol use: No  . Drug use: No  . Sexual activity: Not on file  Lifestyle  . Physical activity:    Days per week: Not on file    Minutes per session: Not on file  . Stress: Not on file  Relationships  . Social connections:    Talks on phone: Not on file    Gets together: Not on file    Attends religious service: Not on file    Active member of club or organization: Not on file    Attends meetings of clubs or organizations: Not on file    Relationship status: Not on file  Other Topics Concern  . Not on file  Social History Narrative  . Not on file   No past surgical history on file. Past Medical History:  Diagnosis Date  . Chronic pain syndrome   . 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 Lumbago   . Pain in joint, lower leg   . Postlaminectomy syndrome, cervical region   . Primary localized osteoarthrosis, lower leg   . Sciatica   . Thoracic radiculopathy    Ht 6\' 3"  (1.905 m)   Wt 215 lb (97.5 kg)   BMI 26.87 kg/m   Opioid Risk Score:   Fall Risk Score:  `1  Depression screen PHQ 2/9  Dep[facility] screen [facility] 03/03/2019 01/13/2019 12/16/2018 08/16/2018 06/24/2018 01/20/2018 05/27/2017  Decreased Interest 1 1 0 1 3 1 1   Down, Depressed,  Hopeless 1 1 0 1 3 1 1   PHQ - 2 Score 2 2 0 2 6 2 2   Altered sleeping - - - - 3 - -  Tired, decreased energy - - - - 3 - -  Change in appetite - - - - 0 - -  Feeling bad or failure about yourself  - - - - 0 - -  Trouble concentrating - - - - 0 - -  Moving slowly or fidgety/restless - - - - 0 - -  Suicidal thoughts - - - - 0 - -  PHQ-9 Score - - - - 12 - -  Difficult doing work/chores - - - - Not difficult at all - -    Review of Systems  Constitutional: Negative.   HENT: Negative.   Eyes: Negative.   Respiratory: Negative.   Gastrointestinal: Negative.   Endocrine: Negative.   Genitourinary: Negative.   Musculoskeletal: Positive for arthralgias, back pain and myalgias.  Allergic/Immunologic: Negative.   Neurological: Positive for weakness and numbness.  Hematological: Negative.   Psychiatric/Behavioral: Positive for dysphoric mood. The patient is nervous/anxious.   All other systems reviewed and are negative.      Objective:   Physical Exam Vitals signs and nursing note reviewed.  Musculoskeletal:     Comments: No Physical Exam Performed: Virtual Visit  Neurological:     Mental Status: He is oriented to person, place, and time.           Assessment & Plan:  1. Cervical postlaminectomy syndrome, Hx of ACDF C4-5, C6-7, C7-T1, 02/2008. Continue current medication and alternate using heat and ice therapy.03/03/2019 2. Spondylosis of L-spine . Continue current treatment regime.03/03/2019 3. Chronic postoperative pain:Refilled:Xtampza 18 mg one capsule every 12 hours for pain #60 andOxycodone 5/325mg  one tablet twice a day #60. Continue Voltaren Gel/ Flector Patch. 03/03/2019. We will continue the opioid monitoring program, this consists of regular clinic visits, examinations, urine drug screen, pill counts as well as use of [place]. 4. Diabetes with neuropathy. Continuecurrent medication regimen withGabapentin.Keep  blood sugars under tight control. Continue to monitor.03/03/2019 5.Cervicalgia/Cervical Radiculopathy/ ThoracicRadiculopathy/ LumbarRadiculopathy:Continue Gabapentin.03/03/2019 6.RightGreater Trochanter Bursitis: Continue Ice/Heat and Current medication regimen.03/03/2019 7. Chronic Pain Syndrome/ Myofascial Pain: Continue Flector Patch/ Alternates with Voltaren Gel. 05//11/2018  Location of patient: At Work  Location of provider: Office Established patient Time spent on call:  10 minutes

## 2019-03-17 ENCOUNTER — Ambulatory Visit: Payer: BLUE CROSS/BLUE SHIELD | Admitting: Physical Medicine & Rehabilitation

## 2019-04-08 ENCOUNTER — Other Ambulatory Visit: Payer: Self-pay | Admitting: Registered Nurse

## 2019-04-13 ENCOUNTER — Other Ambulatory Visit: Payer: Self-pay

## 2019-04-13 ENCOUNTER — Encounter: Payer: BC Managed Care – PPO | Attending: Physical Medicine & Rehabilitation | Admitting: Registered Nurse

## 2019-04-13 ENCOUNTER — Encounter: Payer: Self-pay | Admitting: Registered Nurse

## 2019-04-13 VITALS — BP 117/73 | HR 98 | Temp 98.5°F | Ht 75.0 in | Wt 218.0 lb

## 2019-04-13 DIAGNOSIS — E119 Type 2 diabetes mellitus without complications: Secondary | ICD-10-CM | POA: Diagnosis not present

## 2019-04-13 DIAGNOSIS — M5416 Radiculopathy, lumbar region: Secondary | ICD-10-CM

## 2019-04-13 DIAGNOSIS — M5489 Other dorsalgia: Secondary | ICD-10-CM | POA: Diagnosis not present

## 2019-04-13 DIAGNOSIS — M179 Osteoarthritis of knee, unspecified: Secondary | ICD-10-CM | POA: Diagnosis not present

## 2019-04-13 DIAGNOSIS — M542 Cervicalgia: Secondary | ICD-10-CM | POA: Diagnosis not present

## 2019-04-13 DIAGNOSIS — M7918 Myalgia, other site: Secondary | ICD-10-CM

## 2019-04-13 DIAGNOSIS — G8929 Other chronic pain: Secondary | ICD-10-CM | POA: Insufficient documentation

## 2019-04-13 DIAGNOSIS — M961 Postlaminectomy syndrome, not elsewhere classified: Secondary | ICD-10-CM | POA: Insufficient documentation

## 2019-04-13 DIAGNOSIS — M7061 Trochanteric bursitis, right hip: Secondary | ICD-10-CM

## 2019-04-13 DIAGNOSIS — M5134 Other intervertebral disc degeneration, thoracic region: Secondary | ICD-10-CM | POA: Diagnosis not present

## 2019-04-13 DIAGNOSIS — M47817 Spondylosis without myelopathy or radiculopathy, lumbosacral region: Secondary | ICD-10-CM

## 2019-04-13 DIAGNOSIS — Z5181 Encounter for therapeutic drug level monitoring: Secondary | ICD-10-CM | POA: Diagnosis not present

## 2019-04-13 DIAGNOSIS — M5414 Radiculopathy, thoracic region: Secondary | ICD-10-CM | POA: Diagnosis not present

## 2019-04-13 DIAGNOSIS — M5135 Other intervertebral disc degeneration, thoracolumbar region: Secondary | ICD-10-CM | POA: Diagnosis not present

## 2019-04-13 DIAGNOSIS — M79669 Pain in unspecified lower leg: Secondary | ICD-10-CM | POA: Insufficient documentation

## 2019-04-13 DIAGNOSIS — M47896 Other spondylosis, lumbar region: Secondary | ICD-10-CM | POA: Insufficient documentation

## 2019-04-13 DIAGNOSIS — G894 Chronic pain syndrome: Secondary | ICD-10-CM

## 2019-04-13 DIAGNOSIS — M5412 Radiculopathy, cervical region: Secondary | ICD-10-CM

## 2019-04-13 DIAGNOSIS — Z79899 Other long term (current) drug therapy: Secondary | ICD-10-CM | POA: Diagnosis not present

## 2019-04-13 DIAGNOSIS — Z760 Encounter for issue of repeat prescription: Secondary | ICD-10-CM | POA: Insufficient documentation

## 2019-04-13 DIAGNOSIS — M5430 Sciatica, unspecified side: Secondary | ICD-10-CM | POA: Insufficient documentation

## 2019-04-13 DIAGNOSIS — G609 Hereditary and idiopathic neuropathy, unspecified: Secondary | ICD-10-CM

## 2019-04-13 DIAGNOSIS — M546 Pain in thoracic spine: Secondary | ICD-10-CM

## 2019-04-13 MED ORDER — XTAMPZA ER 18 MG PO C12A: 1.0000 | EXTENDED_RELEASE_CAPSULE | Freq: Two times a day (BID) | ORAL | 0 refills | Status: DC

## 2019-04-13 MED ORDER — OXYCODONE-ACETAMINOPHEN 5-325 MG PO TABS: 1.0000 | ORAL_TABLET | Freq: Two times a day (BID) | ORAL | 0 refills | Status: DC

## 2019-04-13 NOTE — Progress Notes (Signed)
Subjective:    Patient ID: David [name], male    DOB: [birth year], 43 y.o.   MRN: [id number]  HPI: David [name] is a 43 y.o. male who returns for follow up appointment for chronic pain and medication refill.He states his pain is located in his neck radiating into his bilateral shoulders R>L radiating into bilateral arms with tingling, mid- lower back pain radiating into her bilateral lower extremities. Also reports right hip pain.He rates his pain 6. His current exercise regime is walking and performing stretching exercises.  [name] is 69.00 MME.  He is also prescribed Diazepam  by Dr. Elyn Pe[place]. .We have discussed the black box warning of using opioids and benzodiazepines. I highlighted the dangers of using these drugs together and discussed the adverse events including respiratory suppression, overdose, cognitive impairment and importance of compliance with current regimen. We will continue to monitor and adjust as indicated.   Last Oral Swab was Performed on 05/20/2018, is consistent. Oral Swab Performed today.   Pain Inventory Average Pain 5 Pain Right Now 6 My pain is intermittent, constant, sharp, burning, dull, stabbing, tingling and aching  In the last 24 hours, has pain interfered with the following? General activity 5 Relation with others 5 Enjoyment of life 6 What TIME of day is your pain at its worst? all Sleep (in general) Fair  Pain is worse with: walking, bending, sitting, inactivity, standing and some activites Pain improves with: rest, heat/ice, therapy/exercise, pacing activities and medication Relief from Meds: 4  Mobility walk without assistance ability to climb steps?  yes do you drive?  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  no  Physicians involved in your care Any changes since last visit?  no    Family History  Problem Relation Age of Onset  . Arthritis Mother   . Heart disease Mother   . Diabetes Mother   . Heart disease Father   . Diabetes Father    Social History   Socioeconomic History  . Marital status: Married    Spouse name: Not on file  . Number of children: Not on file  . Years of education: Not on file  . Highest education level: Not on file  Occupational History  . Not on file  Social Needs  . Financial resource strain: Not on file  . Food insecurity    Worry: Not on file    Inability: Not on file  . Transportation needs    Medical: Not on file    Non-medical: Not on file  Tobacco Use  . Smoking status: Never Smoker  . Smokeless tobacco: Never Used  Substance and Sexual Activity  . Alcohol use: No  . Drug use: No  . Sexual activity: Not on file  Lifestyle  . Physical activity    Days per week: Not on file    Minutes per session: Not on file  . Stress: Not on file  Relationships  . Social Musicianconnections    Talks on phone: Not on file    Gets together: Not on file    Attends religious service: Not on file    Active member of club or organization: Not on file    Attends meetings of clubs or organizations: Not on file    Relationship status: Not on file  Other Topics Concern  . Not on file  Social  History Narrative  . Not on file   No past surgical history on file. Past Medical History:  Diagnosis Date  . Chronic pain syndrome   . 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 Lumbago   . Pain in joint, lower leg   . Postlaminectomy syndrome, cervical region   . Primary localized osteoarthrosis, lower leg   . Sciatica   . Thoracic radiculopathy    BP 117/73   Pulse 98   Temp 98.5 F (36.9 C)   Ht 6\' 3"  (1.905 m)   Wt 218 lb (98.9 kg)   SpO2 98%   BMI 27.25 kg/m   Opioid Risk Score:   Fall Risk Score:  `1  Depression screen PHQ 2/9  Depression screen [facility] 03/03/2019 01/13/2019 12/16/2018 08/16/2018 06/24/2018  01/20/2018 05/27/2017  Decreased Interest 1 1 0 1 3 1 1   Down, Depressed, Hopeless 1 1 0 1 3 1 1   PHQ - 2 Score 2 2 0 2 6 2 2   Altered sleeping - - - - 3 - -  Tired, decreased energy - - - - 3 - -  Change in appetite - - - - 0 - -  Feeling bad or failure about yourself  - - - - 0 - -  Trouble concentrating - - - - 0 - -  Moving slowly or fidgety/restless - - - - 0 - -  Suicidal thoughts - - - - 0 - -  PHQ-9 Score - - - - 12 - -  Difficult doing work/chores - - - - Not difficult at all - -     Review of Systems  Constitutional: Negative.   HENT: Negative.   Eyes: Negative.   Respiratory: Negative.   Cardiovascular: Negative.   Gastrointestinal: Negative.   Endocrine: Negative.   Genitourinary: Negative.   Musculoskeletal: Positive for arthralgias, back pain and myalgias.  Skin: Negative.   Allergic/Immunologic: Negative.   Neurological: Positive for numbness.  Hematological: Negative.   Psychiatric/Behavioral: Positive for dysphoric mood. The patient is nervous/anxious.   All other systems reviewed and are negative.      Objective:   Physical Exam Vitals signs and nursing note reviewed.  Constitutional:      Appearance: Normal appearance.  Neck:     Musculoskeletal: Normal range of motion and neck supple.     Comments: Cervical Paraspinal Tenderness: C-5-C-6 Mainly Right Side Cardiovascular:     Rate and Rhythm: Normal rate and regular rhythm.     Pulses: Normal pulses.     Heart sounds: Normal heart sounds.  Pulmonary:     Effort: Pulmonary effort is normal.     Breath sounds: Normal breath sounds.  Musculoskeletal:     Comments: Normal Muscle Bulk and Muscle Testing Reveals:  Upper Extremities: Full ROM and Muscle Strength 5/5 Bilateral AC Joint Tenderness  Thoracic Paraspinal Tenderness: T-1-T-3  T-5-T-7 Lumbar Paraspinal Tenderness: L-4-L-5 Lower Extremities: Full ROM and Muscle Strength 5/5 Arises from Table with ease Narrow Based  Gait   Skin:     General: Skin is warm and dry.  Neurological:     Mental Status: He is alert and oriented to person, place, and time.  Psychiatric:        Mood and Affect: Mood normal.        Behavior: Behavior normal.           Assessment & Plan:  1. Cervical postlaminectomy syndrome, Hx of ACDF C4-5, C6-7, C7-T1, 02/2008. Continue current medication  and alternate using heat and ice therapy.04/13/2019 2. Spondylosis of L-spine . Continue current treatment regime.04/13/2019 3. Chronic postoperative pain:Refilled:Xtampza 18 mg one capsule every 12 hours for pain #60 andOxycodone 5/325mg  one tablet twice a day #60. Continue Voltaren Gel/ Flector Patch. 04/13/2019. We will continue the opioid monitoring program, this consists of regular clinic visits, examinations, urine drug screen, pill counts as well as use of [place] Controlled Substance Reporting System. 4. Diabetes with neuropathy. Continuecurrent medication regimen withGabapentin.Keep blood sugars under tight control. Continue to monitor.04/13/2019 5.Cervicalgia/Cervical Radiculopathy/ ThoracicRadiculopathy/ LumbarRadiculopathy:Continue Gabapentin.04/13/2019 6.RightGreater Trochanter Bursitis: Continue Ice/Heat and Current medication regimen.04/13/2019 7. Chronic Pain Syndrome/ Myofascial Pain: Continue Flector Patch/ Alternates with Voltaren Gel. 06//09/2019  19minutes of face to face patient care time was spent during this visit. All questions were encouraged and answered.  F/U in 1 month

## 2019-04-16 LAB — DRUG TOX MONITOR 1 W/CONF, ORAL FLD
Alprazolam: NEGATIVE ng/mL (ref <0.50)
Amphetamines: NEGATIVE ng/mL (ref <10)
Barbiturates: NEGATIVE ng/mL (ref <10)
Benzodiazepines: POSITIVE ng/mL — AB (ref <0.50)
Buprenorphine: NEGATIVE ng/mL (ref <0.10)
Chlordiazepoxide: NEGATIVE ng/mL (ref <0.50)
Clonazepam: NEGATIVE ng/mL (ref <0.50)
Cocaine: NEGATIVE ng/mL (ref <5.0)
Codeine: NEGATIVE ng/mL (ref <2.5)
Diazepam: 1.48 ng/mL — ABNORMAL HIGH (ref <0.50)
Dihydrocodeine: NEGATIVE ng/mL (ref <2.5)
Fentanyl: NEGATIVE ng/mL (ref <0.10)
Flunitrazepam: NEGATIVE ng/mL (ref <0.50)
Flurazepam: NEGATIVE ng/mL (ref <0.50)
Heroin Metabolite: NEGATIVE ng/mL (ref <1.0)
Hydrocodone: NEGATIVE ng/mL (ref <2.5)
Hydromorphone: NEGATIVE ng/mL (ref <2.5)
Lorazepam: NEGATIVE ng/mL (ref <0.50)
MARIJUANA: NEGATIVE ng/mL (ref <2.5)
MDMA: NEGATIVE ng/mL (ref <10)
Meprobamate: NEGATIVE ng/mL (ref <2.5)
Methadone: NEGATIVE ng/mL (ref <5.0)
Midazolam: NEGATIVE ng/mL (ref <0.50)
Morphine: NEGATIVE ng/mL (ref <2.5)
Nicotine Metabolite: NEGATIVE ng/mL (ref <5.0)
Nordiazepam: 2.33 ng/mL — ABNORMAL HIGH (ref <0.50)
Norhydrocodone: NEGATIVE ng/mL (ref <2.5)
Noroxycodone: 36.3 ng/mL — ABNORMAL HIGH (ref <2.5)
Opiates: POSITIVE ng/mL — AB (ref <2.5)
Oxazepam: NEGATIVE ng/mL (ref <0.50)
Oxycodone: >250 ng/mL — ABNORMAL HIGH (ref <2.5)
Oxymorphone: 3.9 ng/mL — ABNORMAL HIGH (ref <2.5)
Phencyclidine: NEGATIVE ng/mL (ref <10)
Tapentadol: NEGATIVE ng/mL (ref <5.0)
Temazepam: NEGATIVE ng/mL (ref <0.50)
Tramadol: NEGATIVE ng/mL (ref <5.0)
Triazolam: NEGATIVE ng/mL (ref <0.50)
Zolpidem: NEGATIVE ng/mL (ref <5.0)

## 2019-04-16 LAB — DRUG TOX ALC METAB W/CON, ORAL FLD: Alcohol Metabolite: NEGATIVE ng/mL (ref <25)

## 2019-04-17 ENCOUNTER — Telehealth: Payer: Self-pay | Admitting: *Deleted

## 2019-04-17 NOTE — Telephone Encounter (Signed)
Oral swab drug screen was consistent for prescribed medications.  ?

## 2019-04-22 ENCOUNTER — Other Ambulatory Visit: Payer: Self-pay | Admitting: Registered Nurse

## 2019-05-17 ENCOUNTER — Encounter: Payer: Self-pay | Admitting: Registered Nurse

## 2019-05-17 ENCOUNTER — Encounter: Payer: BC Managed Care – PPO | Attending: Physical Medicine & Rehabilitation | Admitting: Registered Nurse

## 2019-05-17 ENCOUNTER — Other Ambulatory Visit: Payer: Self-pay

## 2019-05-17 VITALS — BP 119/73 | HR 106 | Temp 97.7°F | Resp 12 | Ht 75.0 in | Wt 220.0 lb

## 2019-05-17 DIAGNOSIS — M5134 Other intervertebral disc degeneration, thoracic region: Secondary | ICD-10-CM | POA: Diagnosis not present

## 2019-05-17 DIAGNOSIS — G609 Hereditary and idiopathic neuropathy, unspecified: Secondary | ICD-10-CM

## 2019-05-17 DIAGNOSIS — M5430 Sciatica, unspecified side: Secondary | ICD-10-CM | POA: Diagnosis not present

## 2019-05-17 DIAGNOSIS — Z760 Encounter for issue of repeat prescription: Secondary | ICD-10-CM | POA: Diagnosis not present

## 2019-05-17 DIAGNOSIS — M542 Cervicalgia: Secondary | ICD-10-CM | POA: Diagnosis not present

## 2019-05-17 DIAGNOSIS — M5414 Radiculopathy, thoracic region: Secondary | ICD-10-CM | POA: Diagnosis not present

## 2019-05-17 DIAGNOSIS — M47817 Spondylosis without myelopathy or radiculopathy, lumbosacral region: Secondary | ICD-10-CM

## 2019-05-17 DIAGNOSIS — M7918 Myalgia, other site: Secondary | ICD-10-CM

## 2019-05-17 DIAGNOSIS — M5135 Other intervertebral disc degeneration, thoracolumbar region: Secondary | ICD-10-CM | POA: Insufficient documentation

## 2019-05-17 DIAGNOSIS — M961 Postlaminectomy syndrome, not elsewhere classified: Secondary | ICD-10-CM | POA: Diagnosis not present

## 2019-05-17 DIAGNOSIS — M5412 Radiculopathy, cervical region: Secondary | ICD-10-CM

## 2019-05-17 DIAGNOSIS — M546 Pain in thoracic spine: Secondary | ICD-10-CM

## 2019-05-17 DIAGNOSIS — M79669 Pain in unspecified lower leg: Secondary | ICD-10-CM | POA: Diagnosis not present

## 2019-05-17 DIAGNOSIS — M7061 Trochanteric bursitis, right hip: Secondary | ICD-10-CM

## 2019-05-17 DIAGNOSIS — G8929 Other chronic pain: Secondary | ICD-10-CM

## 2019-05-17 DIAGNOSIS — E119 Type 2 diabetes mellitus without complications: Secondary | ICD-10-CM | POA: Diagnosis not present

## 2019-05-17 DIAGNOSIS — M5489 Other dorsalgia: Secondary | ICD-10-CM | POA: Diagnosis not present

## 2019-05-17 DIAGNOSIS — M179 Osteoarthritis of knee, unspecified: Secondary | ICD-10-CM | POA: Insufficient documentation

## 2019-05-17 DIAGNOSIS — G894 Chronic pain syndrome: Secondary | ICD-10-CM

## 2019-05-17 DIAGNOSIS — M5416 Radiculopathy, lumbar region: Secondary | ICD-10-CM

## 2019-05-17 DIAGNOSIS — M47896 Other spondylosis, lumbar region: Secondary | ICD-10-CM | POA: Diagnosis not present

## 2019-05-17 DIAGNOSIS — Z79899 Other long term (current) drug therapy: Secondary | ICD-10-CM

## 2019-05-17 DIAGNOSIS — Z5181 Encounter for therapeutic drug level monitoring: Secondary | ICD-10-CM

## 2019-05-17 MED ORDER — XTAMPZA ER 18 MG PO C12A: 1.0000 | EXTENDED_RELEASE_CAPSULE | Freq: Two times a day (BID) | ORAL | 0 refills | Status: DC

## 2019-05-17 MED ORDER — OXYCODONE-ACETAMINOPHEN 5-325 MG PO TABS: 1.0000 | ORAL_TABLET | Freq: Two times a day (BID) | ORAL | 0 refills | Status: DC

## 2019-05-17 NOTE — Progress Notes (Signed)
Subjective:    Patient ID: David LeedsJohn O [name], male    DOB: Nov 22, 1975, 43 y.o.   MRN: [id number]  HPI: David LeedsJohn O [name] is a 43 y.o. male who returns for follow up appointment for chronic pain and medication refill. He states his pain is located in his neck radiating into his bilateral shoulders, mid- lower back pain radiating into his bilateral lower extremities and right hip pain. He  rates his pain 6. His current exercise regime is walking and performing stretching exercises.  David [name] Morphine equivalent is 69.00 MME.  Last Oral Swab was Performed on 04/13/2019, it was consistent.   Pain Inventory Average Pain 5 Pain Right Now 6 My pain is constant, sharp, burning, dull, stabbing, tingling and aching  In the last 24 hours, has pain interfered with the following? General activity 6 Relation with others 5 Enjoyment of life 6 What TIME of day is your pain at its worst? all the time Sleep (in general) Fair  Pain is worse with: walking, bending, sitting, inactivity and standing Pain improves with: rest, heat/ice, therapy/exercise, pacing activities and medication Relief from Meds: 4  Mobility walk without assistance ability to climb steps?  yes do you drive?  yes  Function employed # of hrs/week 50hrs  Neuro/Psych numbness tingling depression anxiety  Prior Studies no  Physicians involved in your care no   Family History  Problem Relation Age of Onset  . Arthritis Mother   . Heart disease Mother   . Diabetes Mother   . Heart disease Father   . Diabetes Father    Social History   Socioeconomic History  . Marital status: Married    Spouse name: Not on file  . Number of children: Not on file  . Years of education: Not on file  . Highest education level: Not on file  Occupational History  . Not on file  Social Needs  . Financial resource strain: Not on file  . Food insecurity    Worry: Not on file    Inability: Not on file  . Transportation needs   Medical: Not on file    Non-medical: Not on file  Tobacco Use  . Smoking status: Never Smoker  . Smokeless tobacco: Never Used  Substance and Sexual Activity  . Alcohol use: No  . Drug use: No  . Sexual activity: Not on file  Lifestyle  . Physical activity    Days per week: Not on file    Minutes per session: Not on file  . Stress: Not on file  Relationships  . Social Musicianconnections    Talks on phone: Not on file    Gets together: Not on file    Attends religious service: Not on file    Active member of club or organization: Not on file    Attends meetings of clubs or organizations: Not on file    Relationship status: Not on file  Other Topics Concern  . Not on file  Social History Narrative  . Not on file   No past surgical history on file. Past Medical History:  Diagnosis Date  . Chronic pain syndrome   . 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 Lumbago   . Pain in joint, lower leg   . Postlaminectomy syndrome, cervical region   . Primary localized osteoarthrosis, lower leg   . Sciatica   . Thoracic radiculopathy    There were no vitals taken for this visit.  Opioid Risk Score:  Fall Risk Score:  `1  Depression screen PHQ 2/9  Depression screen [facility] 2/9 03/03/2019 01/13/2019 12/16/2018 08/16/2018 06/24/2018 01/20/2018 05/27/2017  Decreased Interest 1 1 0 1 3 1 1   Down, Depressed, Hopeless 1 1 0 1 3 1 1   PHQ - 2 Score 2 2 0 2 6 2 2   Altered sleeping - - - - 3 - -  Tired, decreased energy - - - - 3 - -  Change in appetite - - - - 0 - -  Feeling bad or failure about yourself  - - - - 0 - -  Trouble concentrating - - - - 0 - -  Moving slowly or fidgety/restless - - - - 0 - -  Suicidal thoughts - - - - 0 - -  PHQ-9 Score - - - - 12 - -  Difficult doing work/chores - - - - Not difficult at all - -      Review of Systems  All other systems reviewed and are negative.      Objective:   Physical Exam Vitals signs and nursing note reviewed.   Constitutional:      Appearance: Normal appearance.  Neck:     Musculoskeletal: Normal range of motion and neck supple.  Cardiovascular:     Rate and Rhythm: Normal rate and regular rhythm.     Pulses: Normal pulses.     Heart sounds: Normal heart sounds.  Pulmonary:     Effort: Pulmonary effort is normal.     Breath sounds: Normal breath sounds.  Musculoskeletal:     Comments: Normal Muscle Bulk and Muscle Testing Reveals:  Upper Extremities: Full  ROM and Muscle Strength 5/5 Bilateral AC Joint Tenderness  Thoracic and Lumbar Hypersensitivity Lower Extremities: Full ROM and Muscle Strength 5/5 Arises from Table with ease Narrow Based Gait   Skin:    General: Skin is warm and dry.  Neurological:     Mental Status: He is alert and oriented to person, place, and time.  Psychiatric:        Mood and Affect: Mood normal.        Behavior: Behavior normal.           Assessment & Plan:  1. Cervical postlaminectomy syndrome, Hx of ACDF C4-5, C6-7, C7-T1,02/2008. Continue current medication and alternate using heat and ice therapy.05/17/2019 2. Spondylosis of L-spine . Continue current treatment regime.05/17/2019 3. Chronic postoperative pain:Refilled:Xtampza 18 mg one capsule every 12 hours for pain #60 andOxycodone 5/325mg  one tablet twice a day #60. Continue Voltaren Gel/ Flector Patch. 05/17/2019. We will continue the opioid monitoring program, this consists of regular clinic visits, examinations, urine drug screen, pill counts as well as use of [place] Controlled Substance Reporting System. 4. Diabetes with neuropathy. Continuecurrent medication regimen withGabapentin.Keep blood sugars under tight control. Continue to monitor.05/17/2019 5.Cervicalgia/Cervical Radiculopathy/ ThoracicRadiculopathy/ LumbarRadiculopathy:Continue Gabapentin.05/17/2019 6.RightGreater Trochanter Bursitis: Continue Ice/Heat and Current medication regimen.05/17/2019 7. Chronic  Pain Syndrome/ Myofascial Pain: Continue Flector Patch/ Alternates with Voltaren Gel. 07/15//2020  50minutes of face to face patient care time was spent during this visit. All questions were encouraged and answered.  F/U in 1 month

## 2019-05-18 ENCOUNTER — Encounter: Payer: BC Managed Care – PPO | Admitting: Registered Nurse

## 2019-06-14 ENCOUNTER — Encounter: Payer: BC Managed Care – PPO | Attending: Physical Medicine & Rehabilitation | Admitting: Registered Nurse

## 2019-06-14 ENCOUNTER — Encounter: Payer: Self-pay | Admitting: Registered Nurse

## 2019-06-14 ENCOUNTER — Other Ambulatory Visit: Payer: Self-pay

## 2019-06-14 VITALS — BP 121/73 | HR 98 | Temp 99.3°F | Resp 14 | Ht 75.0 in | Wt 222.2 lb

## 2019-06-14 DIAGNOSIS — M7918 Myalgia, other site: Secondary | ICD-10-CM

## 2019-06-14 DIAGNOSIS — Z760 Encounter for issue of repeat prescription: Secondary | ICD-10-CM | POA: Insufficient documentation

## 2019-06-14 DIAGNOSIS — M5414 Radiculopathy, thoracic region: Secondary | ICD-10-CM

## 2019-06-14 DIAGNOSIS — M5412 Radiculopathy, cervical region: Secondary | ICD-10-CM

## 2019-06-14 DIAGNOSIS — M5489 Other dorsalgia: Secondary | ICD-10-CM | POA: Diagnosis not present

## 2019-06-14 DIAGNOSIS — M5430 Sciatica, unspecified side: Secondary | ICD-10-CM | POA: Insufficient documentation

## 2019-06-14 DIAGNOSIS — Z5181 Encounter for therapeutic drug level monitoring: Secondary | ICD-10-CM

## 2019-06-14 DIAGNOSIS — M47817 Spondylosis without myelopathy or radiculopathy, lumbosacral region: Secondary | ICD-10-CM

## 2019-06-14 DIAGNOSIS — M546 Pain in thoracic spine: Secondary | ICD-10-CM | POA: Diagnosis not present

## 2019-06-14 DIAGNOSIS — M47896 Other spondylosis, lumbar region: Secondary | ICD-10-CM | POA: Diagnosis not present

## 2019-06-14 DIAGNOSIS — E119 Type 2 diabetes mellitus without complications: Secondary | ICD-10-CM | POA: Insufficient documentation

## 2019-06-14 DIAGNOSIS — G609 Hereditary and idiopathic neuropathy, unspecified: Secondary | ICD-10-CM

## 2019-06-14 DIAGNOSIS — M5134 Other intervertebral disc degeneration, thoracic region: Secondary | ICD-10-CM | POA: Insufficient documentation

## 2019-06-14 DIAGNOSIS — M179 Osteoarthritis of knee, unspecified: Secondary | ICD-10-CM | POA: Insufficient documentation

## 2019-06-14 DIAGNOSIS — M961 Postlaminectomy syndrome, not elsewhere classified: Secondary | ICD-10-CM

## 2019-06-14 DIAGNOSIS — Z79899 Other long term (current) drug therapy: Secondary | ICD-10-CM

## 2019-06-14 DIAGNOSIS — G8929 Other chronic pain: Secondary | ICD-10-CM

## 2019-06-14 DIAGNOSIS — M79669 Pain in unspecified lower leg: Secondary | ICD-10-CM | POA: Diagnosis not present

## 2019-06-14 DIAGNOSIS — M7061 Trochanteric bursitis, right hip: Secondary | ICD-10-CM

## 2019-06-14 DIAGNOSIS — G894 Chronic pain syndrome: Secondary | ICD-10-CM

## 2019-06-14 DIAGNOSIS — M542 Cervicalgia: Secondary | ICD-10-CM | POA: Diagnosis not present

## 2019-06-14 DIAGNOSIS — M5416 Radiculopathy, lumbar region: Secondary | ICD-10-CM

## 2019-06-14 DIAGNOSIS — M5135 Other intervertebral disc degeneration, thoracolumbar region: Secondary | ICD-10-CM | POA: Diagnosis not present

## 2019-06-14 MED ORDER — XTAMPZA ER 18 MG PO C12A: 1.0000 | EXTENDED_RELEASE_CAPSULE | Freq: Two times a day (BID) | ORAL | 0 refills | Status: DC

## 2019-06-14 MED ORDER — OXYCODONE-ACETAMINOPHEN 5-325 MG PO TABS: 1.0000 | ORAL_TABLET | Freq: Two times a day (BID) | ORAL | 0 refills | Status: DC

## 2019-06-14 NOTE — Progress Notes (Signed)
Subjective:    Patient ID: David [name], male    DOB: 03/08/1976, 43 y.o.   MRN: [id number]  HPI: David [name] is a 43 y.o. male who returns for follow up appointment for chronic pain and medication refill. He states his pain is located in his neck radiating into his left upper arm, upper back pain, mid back pain radiating outwards he reports, lower back pain radiating into bilateral lower extremities L>R and right hip pain. He rates his pain 6 His. current exercise regime is walking and performing stretching exercises.  David [name] Morphine equivalent is 69.00 MME. Last Oral Swab was Performed on 04/13/2019, it was consistent.   Pain Inventory Average Pain 5 Pain Right Now 6 My pain is intermittent, constant, sharp, burning, dull, stabbing, tingling and aching  In the last 24 hours, has pain interfered with the following? General activity 6 Relation with others 5 Enjoyment of life 6 What TIME of day is your pain at its worst? all Sleep (in general) Fair  Pain is worse with: walking, bending, sitting, inactivity, standing and some activites Pain improves with: rest, heat/ice, therapy/exercise, pacing activities and medication Relief from Meds: 5  Mobility walk without assistance how many minutes can you walk? 45 ability to climb steps?  yes do you drive?  yes  Function employed # of hrs/week 50  Neuro/Psych numbness tingling spasms depression anxiety  Prior Studies Any changes since last visit?  no  Physicians involved in your care Any changes since last visit?  no   Family History  Problem Relation Age of Onset  . Arthritis Mother   . Heart disease Mother   . Diabetes Mother   . Heart disease Father   . Diabetes Father    Social History   Socioeconomic History  . Marital status: Married    Spouse name: Not on file  . Number of children: Not on file  . Years of education: Not on file  . Highest education level: Not on file  Occupational History  .  Not on file  Social Needs  . Financial resource strain: Not on file  . Food insecurity    Worry: Not on file    Inability: Not on file  . Transportation needs    Medical: Not on file    Non-medical: Not on file  Tobacco Use  . Smoking status: Never Smoker  . Smokeless tobacco: Never Used  Substance and Sexual Activity  . Alcohol use: No  . Drug use: No  . Sexual activity: Not on file  Lifestyle  . Physical activity    Days per week: Not on file    Minutes per session: Not on file  . Stress: Not on file  Relationships  . Social Herbalist on phone: Not on file    Gets together: Not on file    Attends religious service: Not on file    Active member of club or organization: Not on file    Attends meetings of clubs or organizations: Not on file    Relationship status: Not on file  Other Topics Concern  . Not on file  Social History Narrative  . Not on file   No past surgical history on file. Past Medical History:  Diagnosis Date  . Chronic pain syndrome   . 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 Lumbago   . Pain in joint, lower leg   . Postlaminectomy syndrome, cervical region   .  Primary localized osteoarthrosis, lower leg   . Sciatica   . Thoracic radiculopathy    There were no vitals taken for this visit.  Opioid Risk Score:   Fall Risk Score:  `1  Depression screen PHQ 2/9  [facility] 2/9 03/03/2019 01/13/2019 12/16/2018 08/16/2018 06/24/2018 01/20/2018 05/27/2017  Decreased Interest 1 1 0 1 3 1 1   Down, Depressed, Hopeless 1 1 0 1 3 1 1   PHQ - 2 Score 2 2 0 2 6 2 2   Altered sleeping - - - - 3 - -  Tired, decreased energy - - - - 3 - -  Change in appetite - - - - 0 - -  Feeling bad or failure about yourself  - - - - 0 - -  Trouble concentrating - - - - 0 - -  Moving slowly or fidgety/restless - - - - 0 - -  Suicidal thoughts - - - - 0 - -  PHQ-9 Score - - - - 12 - -  Difficult doing work/chores - - - - Not  difficult at all - -     Review of Systems  Constitutional: Negative.   HENT: Negative.   Eyes: Negative.   Respiratory: Negative.   Cardiovascular: Negative.   Gastrointestinal: Negative.   Endocrine: Negative.   Genitourinary: Negative.   Musculoskeletal: Positive for arthralgias, back pain, myalgias and neck pain.  Skin: Negative.   Allergic/Immunologic: Negative.   Neurological: Positive for numbness.  Hematological: Negative.   Psychiatric/Behavioral: Positive for dysphoric mood. The patient is nervous/anxious.        Objective:   Physical Exam Vitals signs and nursing note reviewed.  Constitutional:      Appearance: Normal appearance.  Neck:     Musculoskeletal: Normal range of motion and neck supple.     Comments: Cervical Paraspinal Tenderness: C-5-C-6 Cardiovascular:     Rate and Rhythm: Normal rate and regular rhythm.     Pulses: Normal pulses.     Heart sounds: Normal heart sounds.  Pulmonary:     Effort: Pulmonary effort is normal.     Breath sounds: Normal breath sounds.  Musculoskeletal:     Comments: Normal Muscle Bulk and Muscle Testing Reveals:  Upper Extremities: Full ROM and Muscle Strength 5/5 Bilateral AC Joint Tenderness  Thoracic and Lumbar Hypersensitivity Right Greater Trochanter Tenderness Lower Extremities: Full ROM and Muscle Strength 5/5 Arises from Table with ease Narrow Based Gait   Skin:    General: Skin is warm and dry.  Neurological:     Mental Status: He is alert and oriented to person, place, and time.  Psychiatric:        Mood and Affect: Mood normal.        Behavior: Behavior normal.           Assessment & Plan:  1. Cervical postlaminectomy syndrome, Hx of ACDF C4-5, C6-7, C7-T1,02/2008. Continue current medication and alternate using heat and ice therapy.06/14/2019 2. Spondylosis of L-spine . Continue current treatment regime.06/14/2019 3. Chronic postoperative pain:Refilled:Xtampza 18 mg one capsule every 12  hours for pain #60 andOxycodone 5/325mg  one tablet twice a day #60. Continue Voltaren Gel/ Flector Patch. 06/14/2019. We will continue the opioid monitoring program, this consists of regular clinic visits, examinations, urine drug screen, pill counts as well as use [place]. 4. Diabetes with neuropathy. Continuecurrent medication regimen withGabapentin.Keep blood sugars under tight control. Continue to monitor.06/14/2019 5.Cervicalgia/Cervical Radiculopathy/ ThoracicRadiculopathy/ LumbarRadiculopathy:Continue Gabapentin.06/14/2019 6.RightGreater Trochanter Bursitis: Continue  Ice/Heat and Current medication regimen.06/14/2019 7. Chronic Pain Syndrome/ Myofascial Pain: Continue Flector Patch/ Alternates with Voltaren Gel. 08//10/2019  15minutes of face to face patient care time was spent during this visit. All questions were encouraged and answered.  F/U in 1 month

## 2019-07-12 ENCOUNTER — Other Ambulatory Visit: Payer: Self-pay

## 2019-07-12 ENCOUNTER — Encounter: Payer: BC Managed Care – PPO | Attending: Physical Medicine & Rehabilitation | Admitting: Registered Nurse

## 2019-07-12 VITALS — BP 113/74 | HR 92 | Temp 98.9°F | Ht 75.0 in | Wt 222.2 lb

## 2019-07-12 DIAGNOSIS — M47817 Spondylosis without myelopathy or radiculopathy, lumbosacral region: Secondary | ICD-10-CM

## 2019-07-12 DIAGNOSIS — M5134 Other intervertebral disc degeneration, thoracic region: Secondary | ICD-10-CM | POA: Diagnosis not present

## 2019-07-12 DIAGNOSIS — M546 Pain in thoracic spine: Secondary | ICD-10-CM | POA: Diagnosis not present

## 2019-07-12 DIAGNOSIS — M5416 Radiculopathy, lumbar region: Secondary | ICD-10-CM

## 2019-07-12 DIAGNOSIS — M5412 Radiculopathy, cervical region: Secondary | ICD-10-CM | POA: Diagnosis not present

## 2019-07-12 DIAGNOSIS — E119 Type 2 diabetes mellitus without complications: Secondary | ICD-10-CM | POA: Insufficient documentation

## 2019-07-12 DIAGNOSIS — M5135 Other intervertebral disc degeneration, thoracolumbar region: Secondary | ICD-10-CM | POA: Insufficient documentation

## 2019-07-12 DIAGNOSIS — Z79899 Other long term (current) drug therapy: Secondary | ICD-10-CM

## 2019-07-12 DIAGNOSIS — G894 Chronic pain syndrome: Secondary | ICD-10-CM

## 2019-07-12 DIAGNOSIS — M5489 Other dorsalgia: Secondary | ICD-10-CM | POA: Insufficient documentation

## 2019-07-12 DIAGNOSIS — G609 Hereditary and idiopathic neuropathy, unspecified: Secondary | ICD-10-CM

## 2019-07-12 DIAGNOSIS — G8929 Other chronic pain: Secondary | ICD-10-CM

## 2019-07-12 DIAGNOSIS — M961 Postlaminectomy syndrome, not elsewhere classified: Secondary | ICD-10-CM

## 2019-07-12 DIAGNOSIS — M179 Osteoarthritis of knee, unspecified: Secondary | ICD-10-CM | POA: Insufficient documentation

## 2019-07-12 DIAGNOSIS — Z760 Encounter for issue of repeat prescription: Secondary | ICD-10-CM | POA: Diagnosis not present

## 2019-07-12 DIAGNOSIS — M7918 Myalgia, other site: Secondary | ICD-10-CM

## 2019-07-12 DIAGNOSIS — M47896 Other spondylosis, lumbar region: Secondary | ICD-10-CM | POA: Insufficient documentation

## 2019-07-12 DIAGNOSIS — E0842 Diabetes mellitus due to underlying condition with diabetic polyneuropathy: Secondary | ICD-10-CM

## 2019-07-12 DIAGNOSIS — M79669 Pain in unspecified lower leg: Secondary | ICD-10-CM | POA: Insufficient documentation

## 2019-07-12 DIAGNOSIS — M5430 Sciatica, unspecified side: Secondary | ICD-10-CM | POA: Diagnosis not present

## 2019-07-12 DIAGNOSIS — Z5181 Encounter for therapeutic drug level monitoring: Secondary | ICD-10-CM

## 2019-07-12 DIAGNOSIS — M542 Cervicalgia: Secondary | ICD-10-CM

## 2019-07-12 DIAGNOSIS — M7061 Trochanteric bursitis, right hip: Secondary | ICD-10-CM

## 2019-07-12 DIAGNOSIS — M5414 Radiculopathy, thoracic region: Secondary | ICD-10-CM

## 2019-07-12 MED ORDER — OXYCODONE-ACETAMINOPHEN 5-325 MG PO TABS: 1.0000 | ORAL_TABLET | Freq: Two times a day (BID) | ORAL | 0 refills | Status: DC

## 2019-07-12 MED ORDER — XTAMPZA ER 18 MG PO C12A: 1.0000 | EXTENDED_RELEASE_CAPSULE | Freq: Two times a day (BID) | ORAL | 0 refills | Status: DC

## 2019-07-12 NOTE — Progress Notes (Signed)
Subjective:    Patient ID: David [name], male    DOB: [birth year], 43 y.o.   MRN: [id number]  HPI: David [name] is a 43 y.o. male who returns for follow up appointment for chronic pain and medication refill. He states his pain is located in his neck radiating into his left arm with tingling radiating into his left pinky and ring finger, mid- back pain radiating outwards and lower back pain radiating into his lower extremities L>R and right hip pain. Also reports occasionally his left ear has numbness and tingling. He rates his pain 5. His current exercise regime is walking and performing stretching exercises.  David [name] Morphine equivalent is 69.00  MME.  Last Oral Swab was Performed on 04/13/2019, it was consistent.   Pain Inventory Average Pain 5 Pain Right Now 5 My pain is intermittent, constant, sharp, burning, dull, stabbing, tingling and aching  In the last 24 hours, has pain interfered with the following? General activity 6 Relation with others 5 Enjoyment of life 6 What TIME of day is your pain at its worst? morning, daytime and evening Sleep (in general) Fair  Pain is worse with: walking, bending, sitting, inactivity, standing and some activites Pain improves with: rest, heat/ice, therapy/exercise, pacing activities and medication Relief from Meds: 4  Mobility walk without assistance how many minutes can you walk? 40 ability to climb steps?  yes do you drive?  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  no  Physicians involved in your care Any changes since last visit?  no   Family History  Problem Relation Age of Onset  . Arthritis Mother   . Heart disease Mother   . Diabetes Mother   . Heart disease Father   . Diabetes Father    Social History   Socioeconomic History  . Marital status: Married    Spouse name: Not on file  .  Number of children: Not on file  . Years of education: Not on file  . Highest education level: Not on file  Occupational History  . Not on file  Social Needs  . Financial resource strain: Not on file  . Food insecurity    Worry: Not on file    Inability: Not on file  . Transportation needs    Medical: Not on file    Non-medical: Not on file  Tobacco Use  . Smoking status: Never Smoker  . Smokeless tobacco: Never Used  Substance and Sexual Activity  . Alcohol use: No  . Drug use: No  . Sexual activity: Not on file  Lifestyle  . Physical activity    Days per week: Not on file    Minutes per session: Not on file  . Stress: Not on file  Relationships  . Social Musician on phone: Not on file    Gets together: Not on file    Attends religious service: Not on file    Active member of club or organization: Not on file    Attends meetings of clubs or organizations: Not on file    Relationship status: Not on file  Other Topics Concern  . Not on file  Social History Narrative  . Not on file   No past surgical history on file. Past Medical History:  Diagnosis Date  . Chronic pain syndrome   . Degeneration of thoracic  or thoracolumbar intervertebral disc   . Lumbago   . Pain in joint, lower leg   . Postlaminectomy syndrome, cervical region   . Primary localized osteoarthrosis, lower leg   . Sciatica   . Thoracic radiculopathy    BP 113/74 (BP Location: Left Arm)   Pulse 92   Temp 98.9 F (37.2 C)   Ht 6\' 3"  (1.905 m)   Wt 222 lb 3.2 oz (100.8 kg)   SpO2 96%   BMI 27.77 kg/m   Opioid Risk Score:   Fall Risk Score:  `1  Depression screen PHQ 2/9  Depression screen [facility] 2/9 03/03/2019 01/13/2019 12/16/2018 08/16/2018 06/24/2018 01/20/2018 05/27/2017  Decreased Interest 1 1 0 1 3 1 1   Down, Depressed, Hopeless 1 1 0 1 3 1 1   PHQ - 2 Score 2 2 0 2 6 2 2   Altered sleeping - - - - 3 - -  Tired, decreased energy - - - - 3 - -  Change in appetite - - - - 0 - -   Feeling bad or failure about yourself  - - - - 0 - -  Trouble concentrating - - - - 0 - -  Moving slowly or fidgety/restless - - - - 0 - -  Suicidal thoughts - - - - 0 - -  PHQ-9 Score - - - - 12 - -  Difficult doing work/chores - - - - Not difficult at all - -    Review of Systems  Constitutional: Negative.   HENT: Negative.   Eyes: Negative.   Respiratory: Negative.   Cardiovascular: Negative.   Gastrointestinal: Negative.   Endocrine: Negative.   Genitourinary: Negative.   Musculoskeletal: Negative.   Skin: Negative.   Allergic/Immunologic: Negative.   Neurological: Positive for numbness.  Hematological: Negative.   Psychiatric/Behavioral: Positive for dysphoric mood. The patient is nervous/anxious.   All other systems reviewed and are negative.      Objective:   Physical Exam Vitals signs and nursing note reviewed.  Constitutional:      Appearance: Normal appearance.  Neck:     Musculoskeletal: Normal range of motion and neck supple.     Comments: Cervical Paraspinal Tenderness: C-5-C-6 Cardiovascular:     Rate and Rhythm: Normal rate and regular rhythm.     Pulses: Normal pulses.     Heart sounds: Normal heart sounds.  Pulmonary:     Effort: Pulmonary effort is normal.     Breath sounds: Normal breath sounds.  Musculoskeletal:     Comments: Normal Muscle Bulk and Muscle Testing Reveals:  Upper Extremities: Full ROM and Muscle Strength 5/5 Bilateral AC Joint Tenderness  Thoracic and Lumbar Hypersensitivity Lower Extremities: Full ROM and Muscle Strength 5/5 Arises from chair with ease Narrow Based Gait   Skin:    General: Skin is warm and dry.  Neurological:     Mental Status: He is alert and oriented to person, place, and time.  Psychiatric:        Mood and Affect: Mood normal.        Behavior: Behavior normal.           Assessment & Plan:  1. Cervical postlaminectomy syndrome, Hx of ACDF C4-5, C6-7, C7-T1,02/2008. Continue current medication and  alternate using heat and ice therapy.07/12/2019 2. Spondylosis of L-spine . Continue current treatment regime.07/12/2019 3. Chronic postoperative pain:Refilled:Xtampza 18 mg one capsule every 12 hours for pain #60 andOxycodone 5/325mg  one tablet twice a day #60. Continue Voltaren Gel/ Flector Patch.  07/12/2019. We will continue the opioid monitoring program, this consists of regular clinic visits, examinations, urine drug screen, pill counts as well as use of [place]. 4. Diabetes with neuropathy. Continuecurrent medication regimen withGabapentin.Keep blood sugars under tight control. Continue to monitor.07/12/2019 5.Cervicalgia/Cervical Radiculopathy/ ThoracicRadiculopathy/ LumbarRadiculopathy:Continue Gabapentin.07/12/2019 6.RightGreater Trochanter Bursitis: Continue Ice/Heat and Current medication regimen.07/12/2019 7. Chronic Pain Syndrome/ Myofascial Pain: Continue Flector Patch/ Alternates with Voltaren Gel. 09//07/2019  15minutes of face to face patient care time was spent during this visit. All questions were encouraged and answered.  F/U in 1 month

## 2019-07-13 ENCOUNTER — Encounter: Payer: Self-pay | Admitting: Registered Nurse

## 2019-08-11 ENCOUNTER — Encounter: Payer: BC Managed Care – PPO | Attending: Physical Medicine & Rehabilitation | Admitting: Registered Nurse

## 2019-08-11 ENCOUNTER — Other Ambulatory Visit: Payer: Self-pay

## 2019-08-11 ENCOUNTER — Encounter: Payer: Self-pay | Admitting: Registered Nurse

## 2019-08-11 VITALS — BP 118/81 | HR 99 | Temp 97.7°F | Ht 75.0 in | Wt 225.0 lb

## 2019-08-11 DIAGNOSIS — M5430 Sciatica, unspecified side: Secondary | ICD-10-CM | POA: Insufficient documentation

## 2019-08-11 DIAGNOSIS — M179 Osteoarthritis of knee, unspecified: Secondary | ICD-10-CM | POA: Diagnosis not present

## 2019-08-11 DIAGNOSIS — M5134 Other intervertebral disc degeneration, thoracic region: Secondary | ICD-10-CM | POA: Insufficient documentation

## 2019-08-11 DIAGNOSIS — M5135 Other intervertebral disc degeneration, thoracolumbar region: Secondary | ICD-10-CM | POA: Insufficient documentation

## 2019-08-11 DIAGNOSIS — M5414 Radiculopathy, thoracic region: Secondary | ICD-10-CM | POA: Diagnosis not present

## 2019-08-11 DIAGNOSIS — M47896 Other spondylosis, lumbar region: Secondary | ICD-10-CM | POA: Insufficient documentation

## 2019-08-11 DIAGNOSIS — M542 Cervicalgia: Secondary | ICD-10-CM | POA: Diagnosis not present

## 2019-08-11 DIAGNOSIS — M5489 Other dorsalgia: Secondary | ICD-10-CM | POA: Insufficient documentation

## 2019-08-11 DIAGNOSIS — Z5181 Encounter for therapeutic drug level monitoring: Secondary | ICD-10-CM | POA: Diagnosis not present

## 2019-08-11 DIAGNOSIS — G894 Chronic pain syndrome: Secondary | ICD-10-CM | POA: Diagnosis not present

## 2019-08-11 DIAGNOSIS — M5412 Radiculopathy, cervical region: Secondary | ICD-10-CM

## 2019-08-11 DIAGNOSIS — M546 Pain in thoracic spine: Secondary | ICD-10-CM

## 2019-08-11 DIAGNOSIS — Z760 Encounter for issue of repeat prescription: Secondary | ICD-10-CM | POA: Insufficient documentation

## 2019-08-11 DIAGNOSIS — M961 Postlaminectomy syndrome, not elsewhere classified: Secondary | ICD-10-CM

## 2019-08-11 DIAGNOSIS — M79669 Pain in unspecified lower leg: Secondary | ICD-10-CM | POA: Diagnosis not present

## 2019-08-11 DIAGNOSIS — G8929 Other chronic pain: Secondary | ICD-10-CM | POA: Diagnosis not present

## 2019-08-11 DIAGNOSIS — Z79891 Long term (current) use of opiate analgesic: Secondary | ICD-10-CM | POA: Diagnosis not present

## 2019-08-11 DIAGNOSIS — E119 Type 2 diabetes mellitus without complications: Secondary | ICD-10-CM | POA: Insufficient documentation

## 2019-08-11 DIAGNOSIS — M7061 Trochanteric bursitis, right hip: Secondary | ICD-10-CM

## 2019-08-11 DIAGNOSIS — M5416 Radiculopathy, lumbar region: Secondary | ICD-10-CM

## 2019-08-11 DIAGNOSIS — M47817 Spondylosis without myelopathy or radiculopathy, lumbosacral region: Secondary | ICD-10-CM

## 2019-08-11 MED ORDER — OXYCODONE-ACETAMINOPHEN 5-325 MG PO TABS: 1.0000 | ORAL_TABLET | Freq: Two times a day (BID) | ORAL | 0 refills | Status: DC

## 2019-08-11 MED ORDER — XTAMPZA ER 18 MG PO C12A: 1.0000 | EXTENDED_RELEASE_CAPSULE | Freq: Two times a day (BID) | ORAL | 0 refills | Status: DC

## 2019-08-11 NOTE — Progress Notes (Signed)
Subjective:    Patient ID: David [name], male    DOB: 22-Sep-1976, 43 y.o.   MRN: [id number]  HPI: David [name] is a 43 y.o. male who returns for follow up appointment for chronic pain and medication refill. He states his pain is located in his neck mainly left side radiating into his left shoulder, upper- lower back pain radiating into his bilateral lower extremities L>R and right hip pain. He rates his pain 6. His  current exercise regime is walking and performing stretching exercises.  David [name] Morphine equivalent is 69.00  MME.  Oral Swab was Performed today.   Pain Inventory Average Pain 5 Pain Right Now 6 My pain is intermittent, constant, sharp, burning, dull, stabbing, tingling and aching  In the last 24 hours, has pain interfered with the following? General activity 6 Relation with others 5 Enjoyment of life 6 What TIME of day is your pain at its worst? daytime, evening and night Sleep (in general) Fair  Pain is worse with: walking, bending, sitting, inactivity, standing and some activites Pain improves with: rest, heat/ice, therapy/exercise, pacing activities and medication Relief from Meds: 4  Mobility walk without assistance how many minutes can you walk? 40 ability to climb steps?  yes do you drive?  yes  Function employed # of hrs/week 30  Neuro/Psych tingling depression anxiety  Prior Studies Any changes since last visit?  no x-rays CT/MRI nerve study  Physicians involved in your care Any changes since last visit?  no Primary care [place]   Family History  Problem Relation Age of Onset  . Arthritis Mother   . Heart disease Mother   . Diabetes Mother   . Heart disease Father   . Diabetes Father    Social History   Socioeconomic History  . Marital status: Married    Spouse name: Not on file  . Number of children: Not on file  . Years of education: Not on file  . Highest education level: Not on file  Occupational History  . Not on file   Social Needs  . Financial resource strain: Not on file  . Food insecurity    Worry: Not on file    Inability: Not on file  . Transportation needs    Medical: Not on file    Non-medical: Not on file  Tobacco Use  . Smoking status: Never Smoker  . Smokeless tobacco: Never Used  Substance and Sexual Activity  . Alcohol use: No  . Drug use: No  . Sexual activity: Not on file  Lifestyle  . Physical activity    Days per week: Not on file    Minutes per session: Not on file  . Stress: Not on file  Relationships  . Social Herbalist on phone: Not on file    Gets together: Not on file    Attends religious service: Not on file    Active member of club or organization: Not on file    Attends meetings of clubs or organizations: Not on file    Relationship status: Not on file  Other Topics Concern  . Not on file  Social History Narrative  . Not on file   No past surgical history on file. Past Medical History:  Diagnosis Date  . Chronic pain syndrome   . 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 Lumbago   . Pain in joint, lower leg   . Postlaminectomy syndrome, cervical region   .  Primary localized osteoarthrosis, lower leg   . Sciatica   . Thoracic radiculopathy    BP 118/81   Pulse 99   Temp 97.7 F (36.5 C)   Ht 6\' 3"  (1.905 m)   Wt 225 lb (102.1 kg)   SpO2 96%   BMI 28.12 kg/m   Opioid Risk Score:   Fall Risk Score:  `1  Depression screen PHQ 2/9  Depression screen [facility] 2/9 03/03/2019 01/13/2019 12/16/2018 08/16/2018 06/24/2018 01/20/2018 05/27/2017  Decreased Interest 1 1 0 1 3 1 1   Down, Depressed, Hopeless 1 1 0 1 3 1 1   PHQ - 2 Score 2 2 0 2 6 2 2   Altered sleeping - - - - 3 - -  Tired, decreased energy - - - - 3 - -  Change in appetite - - - - 0 - -  Feeling bad or failure about yourself  - - - - 0 - -  Trouble concentrating - - - - 0 - -  Moving slowly or fidgety/restless - - - - 0 - -  Suicidal thoughts - - - - 0 - -   PHQ-9 Score - - - - 12 - -  Difficult doing work/chores - - - - Not difficult at all - -    Review of Systems  Musculoskeletal:       Spasms  Neurological:       Tingling  Psychiatric/Behavioral: Positive for dysphoric mood. The patient is nervous/anxious.   All other systems reviewed and are negative.      Objective:   Physical Exam Vitals signs and nursing note reviewed.  Constitutional:      Appearance: Normal appearance.  Neck:     Musculoskeletal: Normal range of motion and neck supple.     Comments: Cervical Paraspinal Tenderness: C-5-C-6 Cardiovascular:     Rate and Rhythm: Normal rate and regular rhythm.     Pulses: Normal pulses.     Heart sounds: Normal heart sounds.  Pulmonary:     Effort: Pulmonary effort is normal.     Breath sounds: Normal breath sounds.  Musculoskeletal:     Comments: Normal Muscle Bulk and Muscle Testing Reveals:  Upper Extremities: Full ROM and Muscle Strength 5/5 Bilateral AC Joint Tenderness  Thoracic and Lumbar Hypersensitivity Right greater Trochanter tenderness Lower Extremities: Full ROM and Muscle Strength 5/5 Arises from Table with ease Narrow Based Gait   Skin:    General: Skin is warm and dry.  Neurological:     Mental Status: He is alert and oriented to person, place, and time.  Psychiatric:        Mood and Affect: Mood normal.        Behavior: Behavior normal.           Assessment & Plan:  1. Cervical postlaminectomy syndrome, Hx of ACDF C4-5, C6-7, C7-T1,02/2008. Continue current medication and alternate using heat and ice therapy.08/11/2019 2. Spondylosis of L-spine . Continue current treatment regime.08/11/2019 3. Chronic postoperative pain:Refilled:Xtampza 18 mg one capsule every 12 hours for pain #60 andOxycodone 5/325mg  one tablet twice a day #60. Continue Voltaren Gel/ Flector Patch. 08/11/2019. We will continue the opioid monitoring program, this consists of regular clinic visits, examinations, urine  drug screen, pill counts as well as use of Controlled Substance Reporting System. 4. Diabetes with neuropathy. Continuecurrent medication regimen withGabapentin.Keep blood sugars under tight control. Continue to monitor.08/11/2019 5.Cervicalgia/Cervical Radiculopathy/ ThoracicRadiculopathy/ LumbarRadiculopathy:Continue Gabapentin.08/11/2019 6.RightGreater Trochanter Bursitis: Continue Ice/Heat and Current medication regimen.08/11/2019 7. Chronic  Pain Syndrome/ Myofascial Pain: Continue Flector Patch/ Alternates with Voltaren Gel. 10//07/2019  15minutes of face to face patient care time was spent during this visit. All questions were encouraged and answered.  F/U in 1 month

## 2019-08-15 LAB — DRUG TOX MONITOR 1 W/CONF, ORAL FLD
Alprazolam: NEGATIVE ng/mL (ref <0.50)
Amphetamines: NEGATIVE ng/mL (ref <10)
Barbiturates: NEGATIVE ng/mL (ref <10)
Benzodiazepines: POSITIVE ng/mL — AB (ref <0.50)
Buprenorphine: NEGATIVE ng/mL (ref <0.10)
Chlordiazepoxide: NEGATIVE ng/mL (ref <0.50)
Clonazepam: NEGATIVE ng/mL (ref <0.50)
Cocaine: NEGATIVE ng/mL (ref <5.0)
Codeine: NEGATIVE ng/mL (ref <2.5)
Diazepam: 1.43 ng/mL — ABNORMAL HIGH (ref <0.50)
Dihydrocodeine: NEGATIVE ng/mL (ref <2.5)
Fentanyl: NEGATIVE ng/mL (ref <0.10)
Flunitrazepam: NEGATIVE ng/mL (ref <0.50)
Flurazepam: NEGATIVE ng/mL (ref <0.50)
Heroin Metabolite: NEGATIVE ng/mL (ref <1.0)
Hydrocodone: NEGATIVE ng/mL (ref <2.5)
Hydromorphone: NEGATIVE ng/mL (ref <2.5)
Lorazepam: NEGATIVE ng/mL (ref <0.50)
MARIJUANA: NEGATIVE ng/mL (ref <2.5)
MDMA: NEGATIVE ng/mL (ref <10)
Meprobamate: NEGATIVE ng/mL (ref <2.5)
Methadone: NEGATIVE ng/mL (ref <5.0)
Midazolam: NEGATIVE ng/mL (ref <0.50)
Morphine: NEGATIVE ng/mL (ref <2.5)
Nicotine Metabolite: NEGATIVE ng/mL (ref <5.0)
Nordiazepam: 2.57 ng/mL — ABNORMAL HIGH (ref <0.50)
Norhydrocodone: NEGATIVE ng/mL (ref <2.5)
Noroxycodone: 50.1 ng/mL — ABNORMAL HIGH (ref <2.5)
Opiates: POSITIVE ng/mL — AB (ref <2.5)
Oxazepam: NEGATIVE ng/mL (ref <0.50)
Oxycodone: >250 ng/mL — ABNORMAL HIGH (ref <2.5)
Oxymorphone: 3 ng/mL — ABNORMAL HIGH (ref <2.5)
Phencyclidine: NEGATIVE ng/mL (ref <10)
Tapentadol: NEGATIVE ng/mL (ref <5.0)
Temazepam: NEGATIVE ng/mL (ref <0.50)
Tramadol: NEGATIVE ng/mL (ref <5.0)
Triazolam: NEGATIVE ng/mL (ref <0.50)
Zolpidem: NEGATIVE ng/mL (ref <5.0)

## 2019-08-15 LAB — DRUG TOX ALC METAB W/CON, ORAL FLD: Alcohol Metabolite: NEGATIVE ng/mL (ref <25)

## 2019-08-16 ENCOUNTER — Telehealth: Payer: Self-pay | Admitting: *Deleted

## 2019-08-16 NOTE — Telephone Encounter (Signed)
Oral swab drug screen was consistent for prescribed medications.  ?

## 2019-09-13 ENCOUNTER — Encounter: Payer: Self-pay | Admitting: Registered Nurse

## 2019-09-13 ENCOUNTER — Other Ambulatory Visit: Payer: Self-pay

## 2019-09-13 ENCOUNTER — Encounter: Payer: BC Managed Care – PPO | Attending: Physical Medicine & Rehabilitation | Admitting: Registered Nurse

## 2019-09-13 VITALS — BP 115/77 | HR 86 | Temp 97.7°F | Ht 75.0 in | Wt 225.0 lb

## 2019-09-13 DIAGNOSIS — Z79891 Long term (current) use of opiate analgesic: Secondary | ICD-10-CM

## 2019-09-13 DIAGNOSIS — M79669 Pain in unspecified lower leg: Secondary | ICD-10-CM | POA: Insufficient documentation

## 2019-09-13 DIAGNOSIS — M961 Postlaminectomy syndrome, not elsewhere classified: Secondary | ICD-10-CM

## 2019-09-13 DIAGNOSIS — M47896 Other spondylosis, lumbar region: Secondary | ICD-10-CM | POA: Insufficient documentation

## 2019-09-13 DIAGNOSIS — M5416 Radiculopathy, lumbar region: Secondary | ICD-10-CM

## 2019-09-13 DIAGNOSIS — Z5181 Encounter for therapeutic drug level monitoring: Secondary | ICD-10-CM

## 2019-09-13 DIAGNOSIS — M5430 Sciatica, unspecified side: Secondary | ICD-10-CM | POA: Insufficient documentation

## 2019-09-13 DIAGNOSIS — M5414 Radiculopathy, thoracic region: Secondary | ICD-10-CM | POA: Insufficient documentation

## 2019-09-13 DIAGNOSIS — M542 Cervicalgia: Secondary | ICD-10-CM

## 2019-09-13 DIAGNOSIS — M5134 Other intervertebral disc degeneration, thoracic region: Secondary | ICD-10-CM | POA: Insufficient documentation

## 2019-09-13 DIAGNOSIS — M179 Osteoarthritis of knee, unspecified: Secondary | ICD-10-CM | POA: Insufficient documentation

## 2019-09-13 DIAGNOSIS — M5412 Radiculopathy, cervical region: Secondary | ICD-10-CM

## 2019-09-13 DIAGNOSIS — M5489 Other dorsalgia: Secondary | ICD-10-CM | POA: Diagnosis not present

## 2019-09-13 DIAGNOSIS — E119 Type 2 diabetes mellitus without complications: Secondary | ICD-10-CM | POA: Insufficient documentation

## 2019-09-13 DIAGNOSIS — M5135 Other intervertebral disc degeneration, thoracolumbar region: Secondary | ICD-10-CM | POA: Diagnosis not present

## 2019-09-13 DIAGNOSIS — G8929 Other chronic pain: Secondary | ICD-10-CM

## 2019-09-13 DIAGNOSIS — M7061 Trochanteric bursitis, right hip: Secondary | ICD-10-CM

## 2019-09-13 DIAGNOSIS — Z760 Encounter for issue of repeat prescription: Secondary | ICD-10-CM | POA: Diagnosis present

## 2019-09-13 DIAGNOSIS — M47817 Spondylosis without myelopathy or radiculopathy, lumbosacral region: Secondary | ICD-10-CM

## 2019-09-13 DIAGNOSIS — M546 Pain in thoracic spine: Secondary | ICD-10-CM | POA: Diagnosis not present

## 2019-09-13 DIAGNOSIS — G894 Chronic pain syndrome: Secondary | ICD-10-CM

## 2019-09-13 DIAGNOSIS — M7918 Myalgia, other site: Secondary | ICD-10-CM

## 2019-09-13 MED ORDER — XTAMPZA ER 18 MG PO C12A: 1.0000 | EXTENDED_RELEASE_CAPSULE | Freq: Two times a day (BID) | ORAL | 0 refills | Status: DC

## 2019-09-13 MED ORDER — OXYCODONE-ACETAMINOPHEN 5-325 MG PO TABS: 1.0000 | ORAL_TABLET | Freq: Two times a day (BID) | ORAL | 0 refills | Status: DC

## 2019-09-13 NOTE — Patient Instructions (Signed)
Call office in two weeks to discuss medication changes: [phone]

## 2019-09-13 NOTE — Progress Notes (Signed)
Subjective:    Patient ID: David [name], male    DOB: 10/26/1976, 43 y.o.   MRN: [id number]  HPI: David [name] is a 43 y.o. male who returns for follow up appointment for chronic pain and medication refill. He states his pain is located in his neck radiating into his right shoulder, upper- lower back pain radiating into his bilateral lower extremities, right hip and right knee pain. Also reports increase intensity of pain and not receiving any pain relief when he's taking his oxycodone IR, today we will increase his tablets, he was instructed to call office in 2 weeks to evaluate medication changes, he verbalizes understanding. He rates his pain 6. His current exercise regime is walking and performing stretching exercises.  David [name] is 69.00  MME.  Last Oral Swab was Performed on 08/11/2019, it was consistent.   David [name] is working 50+ hours weekly. We discussed his work hours and duties he verbalizes understanding.   Pain Inventory Average Pain 5 Pain Right Now 6 My pain is sharp, burning, dull, stabbing, tingling and aching  In the last 24 hours, has pain interfered with the following? General activity 7 Relation with others 6 Enjoyment of life 6 What TIME of day is your pain at its worst? all Sleep (in general) Fair  Pain is worse with: walking, bending, sitting, inactivity, standing, unsure and some activites Pain improves with: rest, heat/ice, therapy/exercise, pacing activities and medication Relief from Meds: 4  Mobility walk without assistance how many minutes can you walk? 45+ ability to climb steps?  yes do you drive?  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 Arthritis Mother   . Heart disease Mother   .  Diabetes Mother   . Heart disease Father   . Diabetes Father    Social History   Socioeconomic History  . Marital status: Married    Spouse name: Not on file  . Number of children: Not on file  . Years of education: Not on file  . Highest education level: Not on file  Occupational History  . Not on file  Social Needs  . Financial resource strain: Not on file  . Food insecurity    Worry: Not on file    Inability: Not on file  . Transportation needs    Medical: Not on file    Non-medical: Not on file  Tobacco Use  . Smoking status: Never Smoker  . Smokeless tobacco: Never Used  Substance and Sexual Activity  . Alcohol use: No  . Drug use: No  . Sexual activity: Not on file  Lifestyle  . Physical activity    Days per week: Not on file    Minutes per session: Not on file  . Stress: Not on file  Relationships  . Social Herbalist on phone: Not on file    Gets together: Not on file    Attends religious service: Not on file    Active member of club or organization: Not on file    Attends meetings of clubs or organizations: Not on file    Relationship status: Not on file  Other Topics Concern  . Not on file  Social History Narrative  . Not on file  No past surgical history on file. Past Medical History:  Diagnosis Date  . Chronic pain syndrome   . 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 Lumbago   . Pain in joint, lower leg   . Postlaminectomy syndrome, cervical region   . Primary localized osteoarthrosis, lower leg   . Sciatica   . Thoracic radiculopathy    BP 115/77   Pulse 86   Temp 97.7 F (36.5 C)   Ht 6\' 3"  (1.905 m)   Wt 225 lb (102.1 kg)   SpO2 97%   BMI 28.12 kg/m   Opioid Risk Score:   Fall Risk Score:  `1  Depression screen PHQ 2/9  [facility] 03/03/2019 01/13/2019 12/16/2018 08/16/2018 06/24/2018 01/20/2018 05/27/2017  Decreased Interest 1 1 0 1 3 1 1   Down, Depressed, Hopeless 1 1 0 1 3 1 1    PHQ - 2 Score 2 2 0 2 6 2 2   Altered sleeping - - - - 3 - -  Tired, decreased energy - - - - 3 - -  Change in appetite - - - - 0 - -  Feeling bad or failure about yourself  - - - - 0 - -  Trouble concentrating - - - - 0 - -  Moving slowly or fidgety/restless - - - - 0 - -  Suicidal thoughts - - - - 0 - -  PHQ-9 Score - - - - 12 - -  Difficult doing work/chores - - - - Not difficult at all - -    Review of Systems  Musculoskeletal:       Spasms  Neurological: Positive for numbness.       Tingling   Psychiatric/Behavioral: Positive for dysphoric mood. The patient is nervous/anxious.   All other systems reviewed and are negative.      Objective:   Physical Exam Vitals signs and nursing note reviewed.  Constitutional:      Appearance: Normal appearance.  Neck:     Musculoskeletal: Normal range of motion and neck supple.     Comments: Cervical Paraspinal Tenderness: C-5-C-6 Cardiovascular:     Rate and Rhythm: Normal rate and regular rhythm.     Pulses: Normal pulses.     Heart sounds: Normal heart sounds.  Pulmonary:     Effort: Pulmonary effort is normal.     Breath sounds: Normal breath sounds.  Musculoskeletal:     Comments: Normal Muscle Bulk and Muscle Testing Reveals:  Upper Extremities: Full ROM and Muscle Strength 5/5 Right AC Joint Tenderness Thoracic and Lumbar Hypersensitivity Right Greater Trochanter tenderness Lower Extremities: Full ROM and Muscle Strength 5/5 Arises from Table with ease Narrow Based  Gait   Skin:    General: Skin is warm and dry.  Neurological:     Mental Status: He is alert and oriented to person, place, and time.  Psychiatric:        Mood and Affect: Mood normal.        Behavior: Behavior normal.           Assessment & Plan:  1. Cervical postlaminectomy syndrome, Hx of ACDF C4-5, C6-7, C7-T1,02/2008. Continue current medication and alternate using heat and ice therapy.09/13/2019 2. Spondylosis of L-spine . Continue current  treatment regime.09/13/2019 3. Chronic postoperative pain:Refilled:Xtampza 18 mg one capsule every 12 hours for pain #60 andIncreasedOxycodone 5/325mg  one tablet twice a day, may take a extra tablet when pain is sever # 75. Continue Voltaren Gel/ Flector Patch.  09/13/2019. We will continue the opioid monitoring program, this consists of regular clinic visits, examinations, urine drug screen, pill counts as well as use of [place] Controlled Substance Reporting System. 4. Diabetes with neuropathy. Continuecurrent medication regimen withGabapentin.Keep blood sugars under tight control. Continue to monitor.09/13/2019 5.Cervicalgia/Cervical Radiculopathy/ ThoracicRadiculopathy/ LumbarRadiculopathy:Continue Gabapentin.09/13/2019 6.RightGreater Trochanter Bursitis: Continue Ice/Heat and Current medication regimen.09/13/2019 7. Chronic Pain Syndrome/ Myofascial Pain: Continue Flector Patch/ Alternates with Voltaren Gel. 11//09/2019  of face to face patient care time was spent during this visit. All questions were encouraged and answered.  F/U in 1 month

## 2019-10-03 ENCOUNTER — Other Ambulatory Visit: Payer: Self-pay | Admitting: Registered Nurse

## 2019-10-18 ENCOUNTER — Other Ambulatory Visit: Payer: Self-pay

## 2019-10-18 ENCOUNTER — Encounter: Payer: Self-pay | Admitting: Registered Nurse

## 2019-10-18 ENCOUNTER — Encounter: Payer: BC Managed Care – PPO | Attending: Physical Medicine & Rehabilitation | Admitting: Registered Nurse

## 2019-10-18 VITALS — BP 126/77 | HR 84 | Temp 97.7°F | Ht 75.0 in | Wt 228.0 lb

## 2019-10-18 DIAGNOSIS — M179 Osteoarthritis of knee, unspecified: Secondary | ICD-10-CM | POA: Diagnosis not present

## 2019-10-18 DIAGNOSIS — G8929 Other chronic pain: Secondary | ICD-10-CM | POA: Diagnosis not present

## 2019-10-18 DIAGNOSIS — M542 Cervicalgia: Secondary | ICD-10-CM | POA: Diagnosis not present

## 2019-10-18 DIAGNOSIS — M5412 Radiculopathy, cervical region: Secondary | ICD-10-CM

## 2019-10-18 DIAGNOSIS — M47896 Other spondylosis, lumbar region: Secondary | ICD-10-CM | POA: Diagnosis not present

## 2019-10-18 DIAGNOSIS — M47817 Spondylosis without myelopathy or radiculopathy, lumbosacral region: Secondary | ICD-10-CM

## 2019-10-18 DIAGNOSIS — M5489 Other dorsalgia: Secondary | ICD-10-CM | POA: Insufficient documentation

## 2019-10-18 DIAGNOSIS — M546 Pain in thoracic spine: Secondary | ICD-10-CM | POA: Diagnosis not present

## 2019-10-18 DIAGNOSIS — M5134 Other intervertebral disc degeneration, thoracic region: Secondary | ICD-10-CM | POA: Insufficient documentation

## 2019-10-18 DIAGNOSIS — M79669 Pain in unspecified lower leg: Secondary | ICD-10-CM | POA: Diagnosis not present

## 2019-10-18 DIAGNOSIS — G894 Chronic pain syndrome: Secondary | ICD-10-CM

## 2019-10-18 DIAGNOSIS — M7061 Trochanteric bursitis, right hip: Secondary | ICD-10-CM

## 2019-10-18 DIAGNOSIS — Z5181 Encounter for therapeutic drug level monitoring: Secondary | ICD-10-CM

## 2019-10-18 DIAGNOSIS — M5135 Other intervertebral disc degeneration, thoracolumbar region: Secondary | ICD-10-CM | POA: Insufficient documentation

## 2019-10-18 DIAGNOSIS — M5414 Radiculopathy, thoracic region: Secondary | ICD-10-CM | POA: Insufficient documentation

## 2019-10-18 DIAGNOSIS — Z79891 Long term (current) use of opiate analgesic: Secondary | ICD-10-CM

## 2019-10-18 DIAGNOSIS — Z760 Encounter for issue of repeat prescription: Secondary | ICD-10-CM | POA: Insufficient documentation

## 2019-10-18 DIAGNOSIS — E119 Type 2 diabetes mellitus without complications: Secondary | ICD-10-CM | POA: Insufficient documentation

## 2019-10-18 DIAGNOSIS — M5430 Sciatica, unspecified side: Secondary | ICD-10-CM | POA: Insufficient documentation

## 2019-10-18 DIAGNOSIS — M7918 Myalgia, other site: Secondary | ICD-10-CM

## 2019-10-18 DIAGNOSIS — M961 Postlaminectomy syndrome, not elsewhere classified: Secondary | ICD-10-CM | POA: Diagnosis not present

## 2019-10-18 DIAGNOSIS — M5416 Radiculopathy, lumbar region: Secondary | ICD-10-CM

## 2019-10-18 MED ORDER — XTAMPZA ER 18 MG PO C12A: 1.0000 | EXTENDED_RELEASE_CAPSULE | Freq: Two times a day (BID) | ORAL | 0 refills | Status: DC

## 2019-10-18 MED ORDER — OXYCODONE-ACETAMINOPHEN 5-325 MG PO TABS: 1.0000 | ORAL_TABLET | Freq: Two times a day (BID) | ORAL | 0 refills | Status: DC

## 2019-10-18 NOTE — Progress Notes (Signed)
Subjective:    Patient ID: David LeedsJohn O Mudrick, male    DOB: [birth year], 43 y.o.   MRN: [id number]  HPI: David LeedsJohn O Liera is a 43 y.o. male who returns for follow up appointment for chronic pain and medication refill. He states his pain is located in neck radiating into his bilateral shoulders, left arm with ting and burning into his thumb, ring finger and pinky finger. Also reports mid- lower back pain radiating into his bilateral lower extremities L>R and right hip pain. He rates his pain 5. His current exercise regime is walking and performing stretching exercises.  [name] Morphine equivalent is 72.75MME.  Last Oral Swab was Performed on 08/11/2019, it was consistent.   Pain Inventory Average Pain 6 Pain Right Now 5 My pain is intermittent, sharp, burning, dull, stabbing, tingling and aching  In the last 24 hours, has pain interfered with the following? General activity 6 Relation with others 5 Enjoyment of life 6 What TIME of day is your pain at its worst? daytime Sleep (in general) Fair  Pain is worse with: walking, bending, sitting, inactivity, standing and some activites Pain improves with: medication Relief from Meds: 5  Mobility walk without assistance ability to climb steps?  yes do you drive?  yes  Function employed # of hrs/week 50+  Neuro/Psych numbness tingling spasms dizziness depression anxiety  Prior Studies Any changes since last visit?  no  Physicians involved in your care Any changes since last visit?  no   Family History  Problem Relation Age of Onset  . Arthritis Mother   . Heart disease Mother   . Diabetes Mother   . Heart disease Father   . Diabetes Father    Social History   Socioeconomic History  . Marital status: Married    Spouse name: Not on file  . Number of children: Not on file  . Years of education: Not on file  . Highest education level: Not on file  Occupational History  . Not on file  Tobacco Use  . Smoking status:  Never Smoker  . Smokeless tobacco: Never Used  Substance and Sexual Activity  . Alcohol use: No  . Drug use: No  . Sexual activity: Not on file  Other Topics Concern  . Not on file  Social History Narrative  . Not on file   Social Determinants of Health   Financial Resource Strain:   . Difficulty of Paying Living Expenses: Not on file  Food Insecurity:   . Worried About Programme researcher, broadcasting/film/videounning Out of Food in the Last Year: Not on file  . Ran Out of Food in the Last Year: Not on file  Transportation Needs:   . Lack of Transportation (Medical): Not on file  . Lack of Transportation (Non-Medical): Not on file  Physical Activity:   . Days of Exercise per Week: Not on file  . Minutes of Exercise per Session: Not on file  Stress:   . Feeling of Stress : Not on file  Social Connections:   . Frequency of Communication with Friends and Family: Not on file  . Frequency of Social Gatherings with Friends and Family: Not on file  . Attends Religious Services: Not on file  . Active Member of Clubs or Organizations: Not on file  . Attends BankerClub or Organization Meetings: Not on file  . Marital Status: Not on file   No past surgical history on file. Past Medical History:  Diagnosis Date  . Chronic pain syndrome   .  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 Lumbago   . Pain in joint, lower leg   . Postlaminectomy syndrome, cervical region   . Primary localized osteoarthrosis, lower leg   . Sciatica   . Thoracic radiculopathy    BP 126/77   Pulse 84   Temp 97.7 F (36.5 C)   Ht 6\' 3"  (1.905 m)   Wt 228 lb (103.4 kg)   SpO2 98%   BMI 28.50 kg/m   Opioid Risk Score:   Fall Risk Score:  `1  Depression screen PHQ 2/9  Depression screen [facility] 2/9 03/03/2019 01/13/2019 12/16/2018 08/16/2018 06/24/2018 01/20/2018 05/27/2017  Decreased Interest 1 1 0 1 3 1 1   Down, Depressed, Hopeless 1 1 0 1 3 1 1   PHQ - 2 Score 2 2 0 2 6 2 2   Altered sleeping - - - - 3 - -  Tired, decreased  energy - - - - 3 - -  Change in appetite - - - - 0 - -  Feeling bad or failure about yourself  - - - - 0 - -  Trouble concentrating - - - - 0 - -  Moving slowly or fidgety/restless - - - - 0 - -  Suicidal thoughts - - - - 0 - -  PHQ-9 Score - - - - 12 - -  Difficult doing work/chores - - - - Not difficult at all - -     Review of Systems  Constitutional: Negative.   HENT: Negative.   Eyes: Negative.   Respiratory: Negative.   Cardiovascular: Negative.   Gastrointestinal: Negative.   Endocrine: Negative.   Genitourinary: Negative.   Musculoskeletal: Negative.   Skin: Negative.   Allergic/Immunologic: Negative.   Neurological: Negative.   Hematological: Negative.   Psychiatric/Behavioral: Negative.   All other systems reviewed and are negative.      Objective:   Physical Exam Vitals and nursing note reviewed.  Constitutional:      Appearance: Normal appearance.  Neck:     Comments: Cervical Paraspinal Tenderness: C-5-C-6 Cardiovascular:     Rate and Rhythm: Normal rate and regular rhythm.     Pulses: Normal pulses.     Heart sounds: Normal heart sounds.  Pulmonary:     Effort: Pulmonary effort is normal.     Breath sounds: Normal breath sounds.  Musculoskeletal:     Cervical back: Normal range of motion and neck supple.     Comments: Normal Muscle Bulk and Muscle Testing Reveals:  Upper Extremities: Full ROM and Muscle Strength 5/5 Right AC Joint Tenderness  Thoracic and Lumbar Hypersensitivity Right Greater Trochanter Tenderness Lower Extremities: Full ROM and Muscle Strength 5/5 Arises from Table with ease Narrow Based  Gait   Skin:    General: Skin is warm and dry.  Neurological:     Mental Status: He is alert and oriented to person, place, and time.  Psychiatric:        Mood and Affect: Mood normal.        Behavior: Behavior normal.           Assessment & Plan:  1. Cervical postlaminectomy syndrome, Hx of ACDF C4-5, C6-7, C7-T1,02/2008. Continue  current medication and alternate using heat and ice therapy.10/18/2019 2. Spondylosis of L-spine . Continue current treatment regime.10/18/2019 3. Chronic postoperative pain:Refilled:Xtampza 18 mg one capsule every 12 hours for pain #60 andOxycodone 5/325mg  one tablet twice a day, may take a extra tablet when pain is sever # 75.  Continue Voltaren Gel/ Flector Patch. 10/18/2019. We will continue the opioid monitoring program, this consists of regular clinic visits, examinations, urine drug screen, pill counts as well as use of [place] Controlled Substance Reporting System. 4. Diabetes with neuropathy. Continuecurrent medication regimen withGabapentin.Keep blood sugars under tight control. Continue to monitor.10/18/2019 5.Cervicalgia/Cervical Radiculopathy/ ThoracicRadiculopathy/ LumbarRadiculopathy:Continue Gabapentin.10/18/2019 6.RightGreater Trochanter Bursitis: Continue Ice/Heat and Current medication regimen.10/18/2019 7. Chronic Pain Syndrome/ Myofascial Pain: Continue Flector Patch/ Alternates with Voltaren Gel. 12//16/2020  of face to face patient care time was spent during this visit. All questions were encouraged and answered.  F/U in 1 month

## 2019-11-08 ENCOUNTER — Other Ambulatory Visit: Payer: Self-pay

## 2019-11-08 ENCOUNTER — Encounter: Payer: Self-pay | Admitting: Registered Nurse

## 2019-11-08 ENCOUNTER — Encounter: Payer: 59 | Attending: Physical Medicine & Rehabilitation | Admitting: Registered Nurse

## 2019-11-08 VITALS — BP 122/74 | HR 79 | Temp 97.2°F | Ht 75.0 in | Wt 227.0 lb

## 2019-11-08 DIAGNOSIS — M5414 Radiculopathy, thoracic region: Secondary | ICD-10-CM | POA: Insufficient documentation

## 2019-11-08 DIAGNOSIS — Z760 Encounter for issue of repeat prescription: Secondary | ICD-10-CM | POA: Insufficient documentation

## 2019-11-08 DIAGNOSIS — M7918 Myalgia, other site: Secondary | ICD-10-CM

## 2019-11-08 DIAGNOSIS — M47896 Other spondylosis, lumbar region: Secondary | ICD-10-CM | POA: Diagnosis not present

## 2019-11-08 DIAGNOSIS — Z79891 Long term (current) use of opiate analgesic: Secondary | ICD-10-CM

## 2019-11-08 DIAGNOSIS — M5430 Sciatica, unspecified side: Secondary | ICD-10-CM | POA: Diagnosis not present

## 2019-11-08 DIAGNOSIS — M961 Postlaminectomy syndrome, not elsewhere classified: Secondary | ICD-10-CM | POA: Diagnosis not present

## 2019-11-08 DIAGNOSIS — M546 Pain in thoracic spine: Secondary | ICD-10-CM | POA: Diagnosis not present

## 2019-11-08 DIAGNOSIS — M179 Osteoarthritis of knee, unspecified: Secondary | ICD-10-CM | POA: Diagnosis not present

## 2019-11-08 DIAGNOSIS — M5135 Other intervertebral disc degeneration, thoracolumbar region: Secondary | ICD-10-CM | POA: Insufficient documentation

## 2019-11-08 DIAGNOSIS — E119 Type 2 diabetes mellitus without complications: Secondary | ICD-10-CM | POA: Insufficient documentation

## 2019-11-08 DIAGNOSIS — G8929 Other chronic pain: Secondary | ICD-10-CM

## 2019-11-08 DIAGNOSIS — M5416 Radiculopathy, lumbar region: Secondary | ICD-10-CM

## 2019-11-08 DIAGNOSIS — M47817 Spondylosis without myelopathy or radiculopathy, lumbosacral region: Secondary | ICD-10-CM

## 2019-11-08 DIAGNOSIS — M5134 Other intervertebral disc degeneration, thoracic region: Secondary | ICD-10-CM | POA: Diagnosis not present

## 2019-11-08 DIAGNOSIS — M7061 Trochanteric bursitis, right hip: Secondary | ICD-10-CM

## 2019-11-08 DIAGNOSIS — M5412 Radiculopathy, cervical region: Secondary | ICD-10-CM | POA: Diagnosis not present

## 2019-11-08 DIAGNOSIS — M5489 Other dorsalgia: Secondary | ICD-10-CM | POA: Insufficient documentation

## 2019-11-08 DIAGNOSIS — Z5181 Encounter for therapeutic drug level monitoring: Secondary | ICD-10-CM

## 2019-11-08 DIAGNOSIS — G894 Chronic pain syndrome: Secondary | ICD-10-CM

## 2019-11-08 DIAGNOSIS — M79669 Pain in unspecified lower leg: Secondary | ICD-10-CM | POA: Insufficient documentation

## 2019-11-08 DIAGNOSIS — M542 Cervicalgia: Secondary | ICD-10-CM | POA: Diagnosis not present

## 2019-11-08 MED ORDER — OXYCODONE-ACETAMINOPHEN 5-325 MG PO TABS: 1.0000 | ORAL_TABLET | Freq: Two times a day (BID) | ORAL | 0 refills | Status: DC

## 2019-11-08 MED ORDER — XTAMPZA ER 18 MG PO C12A: 1.0000 | EXTENDED_RELEASE_CAPSULE | Freq: Two times a day (BID) | ORAL | 0 refills | Status: DC

## 2019-11-08 NOTE — Progress Notes (Signed)
Subjective:    Patient ID: David [name], male    DOB: [birth year], 44 y.o.   MRN: [id number]  HPI: [name] is a 44 y.o. male who returns for follow up appointment for chronic pain and medication refill. He states his pain is located in his neck radiating into his right shoulder, left shoulder pain radiating into left arm with tingling and burning, mid- back pain radiating outwards ( thoracic region) and lower back pain radiating into his lower extremities. Also reports right hip pain. He rates his pain 5. His  current exercise regime is walking and performing stretching exercises.  Mr. Beckstead Morphine equivalent is 72.75 MME.  Last Oral Swab was Performed on 08/11/2019, it was consistent.   Pain Inventory Average Pain 5 Pain Right Now 5 My pain is intermittent, constant, sharp, burning, dull, stabbing, tingling and aching  In the last 24 hours, has pain interfered with the following? General activity 6 Relation with others 5 Enjoyment of life 5 What TIME of day is your pain at its worst? morning, daytime, evening Sleep (in general) Fair  Pain is worse with: walking, bending, sitting, inactivity, standing and some activites Pain improves with: rest, heat/ice, therapy/exercise, pacing activities and medication Relief from Meds: 4  Mobility walk without assistance how many minutes can you walk? 45 ability to climb steps?  yes do you drive?  yes  Function employed # of hrs/week 45 what is your job? [employer]  Neuro/Psych numbness tingling spasms depression anxiety  Prior Studies Any changes since last visit?  no  Physicians involved in your care Any changes since last visit?  no   Family History  Problem Relation Age of Onset  . Arthritis Mother   . Heart disease Mother   . Diabetes Mother   . Heart disease Father   . Diabetes Father    Social History   Socioeconomic History  . Marital status: Married    Spouse name: Not on file  . Number of children: Not  on file  . Years of education: Not on file  . Highest education level: Not on file  Occupational History  . Not on file  Tobacco Use  . Smoking status: Never Smoker  . Smokeless tobacco: Never Used  Substance and Sexual Activity  . Alcohol use: No  . Drug use: No  . Sexual activity: Not on file  Other Topics Concern  . Not on file  Social History Narrative  . Not on file   Social Determinants of Health   Financial Resource Strain:   . Difficulty of Paying Living Expenses: Not on file  Food Insecurity:   . Worried About Programme researcher, broadcasting/film/video in the Last Year: Not on file  . Ran Out of Food in the Last Year: Not on file  Transportation Needs:   . Lack of Transportation (Medical): Not on file  . Lack of Transportation (Non-Medical): Not on file  Physical Activity:   . Days of Exercise per Week: Not on file  . Minutes of Exercise per Session: Not on file  Stress:   . Feeling of Stress : Not on file  Social Connections:   . Frequency of Communication with Friends and Family: Not on file  . Frequency of Social Gatherings with Friends and Family: Not on file  . Attends Religious Services: Not on file  . Active Member of Clubs or Organizations: Not on file  . Attends Banker Meetings: Not on file  . Marital  Status: Not on file   History reviewed. No pertinent surgical history. Past Medical History:  Diagnosis Date  . Chronic pain syndrome   . 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 Lumbago   . Pain in joint, lower leg   . Postlaminectomy syndrome, cervical region   . Primary localized osteoarthrosis, lower leg   . Sciatica   . Thoracic radiculopathy    BP 122/74   Pulse 79   Temp (!) 97.2 F (36.2 C)   Ht 6\' 3"  (1.905 m)   Wt 227 lb 12.8 oz (103.3 kg)   SpO2 96%   BMI 28.47 kg/m   Opioid Risk Score:   Fall Risk Score:  `1  Depression screen PHQ 2/9  Depression screen [facility] 2/9 03/03/2019 01/13/2019 12/16/2018 08/16/2018  06/24/2018 01/20/2018 05/27/2017  Decreased Interest 1 1 0 1 3 1 1   Down, Depressed, Hopeless 1 1 0 1 3 1 1   PHQ - 2 Score 2 2 0 2 6 2 2   Altered sleeping - - - - 3 - -  Tired, decreased energy - - - - 3 - -  Change in appetite - - - - 0 - -  Feeling bad or failure about yourself  - - - - 0 - -  Trouble concentrating - - - - 0 - -  Moving slowly or fidgety/restless - - - - 0 - -  Suicidal thoughts - - - - 0 - -  PHQ-9 Score - - - - 12 - -  Difficult doing work/chores - - - - Not difficult at all - -    Review of Systems  Constitutional: Negative.   HENT: Negative.   Eyes: Negative.   Respiratory: Negative.   Cardiovascular: Negative.   Gastrointestinal: Negative.   Endocrine: Negative.   Genitourinary: Negative.   Musculoskeletal: Positive for arthralgias, back pain, neck pain and neck stiffness.       Spasms  Allergic/Immunologic: Negative.   Neurological: Positive for numbness.       Tingling   Hematological: Negative.   Psychiatric/Behavioral: Positive for dysphoric mood. The patient is nervous/anxious.   All other systems reviewed and are negative.      Objective:   Physical Exam Vitals and nursing note reviewed.  Constitutional:      Appearance: Normal appearance.  Cardiovascular:     Rate and Rhythm: Normal rate and regular rhythm.     Pulses: Normal pulses.     Heart sounds: Normal heart sounds.  Pulmonary:     Effort: Pulmonary effort is normal.     Breath sounds: Normal breath sounds.  Musculoskeletal:     Cervical back: Normal range of motion and neck supple.     Comments: Normal Muscle Bulk and Muscle Testing Reveals:  Upper Extremities: Full ROM and Muscle Strength 5/5 Bilateral AC Joint Tenderness  Thoracic and Lumbar Hypersensitivity Right Greater Trochanter Tenderness Lower Extremities: Full ROM and Muscle Strength 5/5 Arises from Table with ease Narrow Based  Gait   Skin:    General: Skin is warm and dry.  Neurological:     Mental Status: He  is alert and oriented to person, place, and time.  Psychiatric:        Mood and Affect: Mood normal.        Behavior: Behavior normal.           Assessment & Plan:  1. Cervical postlaminectomy syndrome, Hx of ACDF C4-5, C6-7, C7-T1,02/2008. Continue current medication and alternate using  heat and ice therapy.11/08/2019 2. Spondylosis of L-spine . Continue current treatment regime.11/08/2019 3. Chronic postoperative pain:Refilled:Xtampza 18 mg one capsule every 12 hours for pain #60 andOxycodone 5/325mg  one tablet twice a day, may take a extra tablet when pain is sever# 75. Continue Voltaren Gel/ Flector Patch.11/08/2019 We will continue the opioid monitoring program, this consists of regular clinic visits, examinations, urine drug screen, pill counts as well as use of [place] Controlled Substance Reporting System. 4. Diabetes with neuropathy. Continuecurrent medication regimen withGabapentin.Keep blood sugars under tight control. Continue to monitor.11/08/2019 5.Cervicalgia/Cervical Radiculopathy/ ThoracicRadiculopathy/ LumbarRadiculopathy:Continue Gabapentin.11/08/2019 6.RightGreater Trochanter Bursitis: Continue Ice/Heat and Current medication regimen.11/08/2019 7. Chronic Pain Syndrome/ Myofascial Pain: Continue Flector Patch/ Alternates with Voltaren Gel.11/08/2019  of face to face patient care time was spent during this visit. All questions were encouraged and answered.  F/U in 1 month

## 2019-12-13 ENCOUNTER — Encounter: Payer: Self-pay | Admitting: Registered Nurse

## 2019-12-13 ENCOUNTER — Encounter: Payer: 59 | Attending: Physical Medicine & Rehabilitation | Admitting: Registered Nurse

## 2019-12-13 ENCOUNTER — Other Ambulatory Visit: Payer: Self-pay

## 2019-12-13 VITALS — BP 125/75 | HR 88 | Temp 97.3°F | Ht 75.0 in | Wt 227.0 lb

## 2019-12-13 DIAGNOSIS — M79669 Pain in unspecified lower leg: Secondary | ICD-10-CM | POA: Insufficient documentation

## 2019-12-13 DIAGNOSIS — M961 Postlaminectomy syndrome, not elsewhere classified: Secondary | ICD-10-CM

## 2019-12-13 DIAGNOSIS — M5430 Sciatica, unspecified side: Secondary | ICD-10-CM | POA: Diagnosis not present

## 2019-12-13 DIAGNOSIS — M5489 Other dorsalgia: Secondary | ICD-10-CM | POA: Diagnosis not present

## 2019-12-13 DIAGNOSIS — M47817 Spondylosis without myelopathy or radiculopathy, lumbosacral region: Secondary | ICD-10-CM

## 2019-12-13 DIAGNOSIS — Z5181 Encounter for therapeutic drug level monitoring: Secondary | ICD-10-CM

## 2019-12-13 DIAGNOSIS — M542 Cervicalgia: Secondary | ICD-10-CM | POA: Diagnosis not present

## 2019-12-13 DIAGNOSIS — M47896 Other spondylosis, lumbar region: Secondary | ICD-10-CM | POA: Diagnosis not present

## 2019-12-13 DIAGNOSIS — M546 Pain in thoracic spine: Secondary | ICD-10-CM

## 2019-12-13 DIAGNOSIS — Z760 Encounter for issue of repeat prescription: Secondary | ICD-10-CM | POA: Diagnosis present

## 2019-12-13 DIAGNOSIS — M179 Osteoarthritis of knee, unspecified: Secondary | ICD-10-CM | POA: Diagnosis not present

## 2019-12-13 DIAGNOSIS — M5414 Radiculopathy, thoracic region: Secondary | ICD-10-CM | POA: Insufficient documentation

## 2019-12-13 DIAGNOSIS — E119 Type 2 diabetes mellitus without complications: Secondary | ICD-10-CM | POA: Diagnosis not present

## 2019-12-13 DIAGNOSIS — M7061 Trochanteric bursitis, right hip: Secondary | ICD-10-CM

## 2019-12-13 DIAGNOSIS — M5135 Other intervertebral disc degeneration, thoracolumbar region: Secondary | ICD-10-CM | POA: Diagnosis not present

## 2019-12-13 DIAGNOSIS — G8929 Other chronic pain: Secondary | ICD-10-CM | POA: Diagnosis not present

## 2019-12-13 DIAGNOSIS — M5416 Radiculopathy, lumbar region: Secondary | ICD-10-CM

## 2019-12-13 DIAGNOSIS — M5134 Other intervertebral disc degeneration, thoracic region: Secondary | ICD-10-CM | POA: Insufficient documentation

## 2019-12-13 DIAGNOSIS — Z79891 Long term (current) use of opiate analgesic: Secondary | ICD-10-CM

## 2019-12-13 DIAGNOSIS — M7918 Myalgia, other site: Secondary | ICD-10-CM

## 2019-12-13 DIAGNOSIS — G894 Chronic pain syndrome: Secondary | ICD-10-CM

## 2019-12-13 DIAGNOSIS — M5412 Radiculopathy, cervical region: Secondary | ICD-10-CM | POA: Diagnosis not present

## 2019-12-13 MED ORDER — XTAMPZA ER 18 MG PO C12A: 1.0000 | EXTENDED_RELEASE_CAPSULE | Freq: Two times a day (BID) | ORAL | 0 refills | Status: DC

## 2019-12-13 MED ORDER — OXYCODONE-ACETAMINOPHEN 5-325 MG PO TABS: 1.0000 | ORAL_TABLET | Freq: Two times a day (BID) | ORAL | 0 refills | Status: DC

## 2019-12-13 NOTE — Progress Notes (Signed)
Subjective:    Patient ID: [name], male    DOB: [birth year], 44 y.o.   MRN: [id number]  HPI: ABUBAKAR CRISPO is a 44 y.o. male who returns for follow up appointment for chronic pain and medication refill. He states he has a headache since this morning, also reporrts he has pain in his neck mainly right side radiating into his bilateral shoulders and left arm with tingling, upper- mid- back. Also states his mid- back pain radiates outward and has lower back pain radiating into his bilateral lower extremities, right hip pain, bilateral knees and bilateral feet pain. He rates his  Pain 6. His current exercise regime is walking and performing stretching exercises.  Mr. Stocks Morphine equivalent is 72.75 MME.  Last Oral Swab was Performed on 08/11/2019, it was consistent.   Pain Inventory Average Pain 5 Pain Right Now 6 My pain is intermittent, constant, sharp, burning, dull, stabbing, tingling and aching  In the last 24 hours, has pain interfered with the following? General activity 6 Relation with others 6 Enjoyment of life 6 What TIME of day is your pain at its worst? all Sleep (in general) Fair  Pain is worse with: walking, bending, sitting, inactivity, standing and some activites Pain improves with: rest, heat/ice, therapy/exercise, pacing activities and medication Relief from Meds: 4  Mobility walk without assistance ability to climb steps?  yes do you drive?  yes  Function employed # of hrs/week 50  Neuro/Psych numbness tingling spasms depression anxiety  Prior Studies Any changes since last visit?  no  Physicians involved in your care Any changes since last visit?  no   Family History  Problem Relation Age of Onset  . Arthritis Mother   . Heart disease Mother   . Diabetes Mother   . Heart disease Father   . Diabetes Father    Social History   Socioeconomic History  . Marital status: Married    Spouse name: Not on file  . Number of children: Not on  file  . Years of education: Not on file  . Highest education level: Not on file  Occupational History  . Not on file  Tobacco Use  . Smoking status: Never Smoker  . Smokeless tobacco: Never Used  Substance and Sexual Activity  . Alcohol use: No  . Drug use: No  . Sexual activity: Not on file  Other Topics Concern  . Not on file  Social History Narrative  . Not on file   Social Determinants of Health   Financial Resource Strain:   . Difficulty of Paying Living Expenses: Not on file  Food Insecurity:   . Worried About Programme researcher, broadcasting/film/video in the Last Year: Not on file  . Ran Out of Food in the Last Year: Not on file  Transportation Needs:   . Lack of Transportation (Medical): Not on file  . Lack of Transportation (Non-Medical): Not on file  Physical Activity:   . Days of Exercise per Week: Not on file  . Minutes of Exercise per Session: Not on file  Stress:   . Feeling of Stress : Not on file  Social Connections:   . Frequency of Communication with Friends and Family: Not on file  . Frequency of Social Gatherings with Friends and Family: Not on file  . Attends Religious Services: Not on file  . Active Member of Clubs or Organizations: Not on file  . Attends Banker Meetings: Not on file  . Marital  Status: Not on file   No past surgical history on file. Past Medical History:  Diagnosis Date  . Chronic pain syndrome   . 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 Lumbago   . Pain in joint, lower leg   . Postlaminectomy syndrome, cervical region   . Primary localized osteoarthrosis, lower leg   . Sciatica   . Thoracic radiculopathy    BP 125/75   Pulse 88   Temp (!) 97.3 F (36.3 C)   Ht 6\' 3"  (1.905 m)   Wt 227 lb (103 kg)   SpO2 97%   BMI 28.37 kg/m   Opioid Risk Score:   Fall Risk Score:  `1  Depression screen PHQ 2/9  Depression screen [facility] 2/9 03/03/2019 01/13/2019 12/16/2018 08/16/2018 06/24/2018 01/20/2018 05/27/2017    Decreased Interest 1 1 0 1 3 1 1   Down, Depressed, Hopeless 1 1 0 1 3 1 1   PHQ - 2 Score 2 2 0 2 6 2 2   Altered sleeping - - - - 3 - -  Tired, decreased energy - - - - 3 - -  Change in appetite - - - - 0 - -  Feeling bad or failure about yourself  - - - - 0 - -  Trouble concentrating - - - - 0 - -  Moving slowly or fidgety/restless - - - - 0 - -  Suicidal thoughts - - - - 0 - -  PHQ-9 Score - - - - 12 - -  Difficult doing work/chores - - - - Not difficult at all - -     Review of Systems  Constitutional: Negative.   HENT: Negative.   Eyes: Negative.   Respiratory: Negative.   Cardiovascular: Negative.   Gastrointestinal: Negative.   Endocrine: Negative.   Genitourinary: Negative.   Musculoskeletal: Positive for arthralgias, back pain and myalgias.  Skin: Negative.   Allergic/Immunologic: Negative.   Neurological: Positive for numbness.  Hematological: Negative.   Psychiatric/Behavioral: Positive for dysphoric mood. The patient is nervous/anxious.   All other systems reviewed and are negative.      Objective:   Physical Exam Vitals and nursing note reviewed.  Constitutional:      Appearance: Normal appearance.  Neck:     Comments: Cervical Paraspinal Tenderness: C-5-C-6 Cardiovascular:     Rate and Rhythm: Normal rate and regular rhythm.     Pulses: Normal pulses.     Heart sounds: Normal heart sounds.  Pulmonary:     Effort: Pulmonary effort is normal.     Breath sounds: Normal breath sounds.  Musculoskeletal:     Cervical back: Normal range of motion and neck supple.     Comments: Normal Muscle Bulk and Muscle Testing Reveals:  Upper Extremities: Full ROM and Muscle Strength 5/5 Bilateral AC Joint Tenderness  Thoracic Paraspinal Tenderness: T-1-T-4  Lumbar Paraspinal Tenderness: L-3-L-5 Right Greater Trochanter Tenderness Lower Extremities: Full ROM and Muscle Strength 5/5 Arises from chair with ease Narrow Based Gait   Skin:    General: Skin is warm and  dry.  Neurological:     Mental Status: He is alert and oriented to person, place, and time.  Psychiatric:        Mood and Affect: Mood normal.        Behavior: Behavior normal.           Assessment & Plan:  1. Cervical postlaminectomy syndrome, Hx of ACDF C4-5, C6-7, C7-T1,02/2008. Continue current medication and alternate using  heat and ice therapy.12/13/2019 2. Spondylosis of L-spine . Continue current treatment regime.12/13/2019 3. Chronic postoperative pain:Refilled:Xtampza 18 mg one capsule every 12 hours for pain #60 andOxycodone 5/325mg  one tablet twice a day, may take a extra tablet when pain is sever# 75. Continue Voltaren Gel/ Flector Patch.12/13/2019 We will continue the opioid monitoring program, this consists of regular clinic visits, examinations, urine drug screen, pill counts as well as use of [place] Controlled Substance Reporting System. 4. Diabetes with neuropathy. Continuecurrent medication regimen withGabapentin.Keep blood sugars under tight control. Continue to monitor.12/13/2019 5.Cervicalgia/Cervical Radiculopathy/ ThoracicRadiculopathy/ LumbarRadiculopathy:Continue Gabapentin.12/13/2019 6.RightGreater Trochanter Bursitis: Continue Ice/Heat and Current medication regimen.12/13/2019 7. Chronic Pain Syndrome/ Myofascial Pain: Continue Flector Patch/ Alternates with Voltaren Gel.12/13/2019  of face to face patient care time was spent during this visit. All questions were encouraged and answered.  F/U in 1 month

## 2020-01-10 ENCOUNTER — Encounter: Payer: 59 | Attending: Physical Medicine & Rehabilitation | Admitting: Registered Nurse

## 2020-01-10 ENCOUNTER — Other Ambulatory Visit: Payer: Self-pay

## 2020-01-10 ENCOUNTER — Encounter: Payer: Self-pay | Admitting: Registered Nurse

## 2020-01-10 VITALS — BP 121/81 | HR 101 | Temp 97.5°F | Resp 14 | Ht 75.0 in | Wt 225.0 lb

## 2020-01-10 DIAGNOSIS — E119 Type 2 diabetes mellitus without complications: Secondary | ICD-10-CM | POA: Diagnosis not present

## 2020-01-10 DIAGNOSIS — M179 Osteoarthritis of knee, unspecified: Secondary | ICD-10-CM | POA: Diagnosis not present

## 2020-01-10 DIAGNOSIS — M5412 Radiculopathy, cervical region: Secondary | ICD-10-CM

## 2020-01-10 DIAGNOSIS — G894 Chronic pain syndrome: Secondary | ICD-10-CM

## 2020-01-10 DIAGNOSIS — Z760 Encounter for issue of repeat prescription: Secondary | ICD-10-CM | POA: Diagnosis present

## 2020-01-10 DIAGNOSIS — M5134 Other intervertebral disc degeneration, thoracic region: Secondary | ICD-10-CM | POA: Insufficient documentation

## 2020-01-10 DIAGNOSIS — M546 Pain in thoracic spine: Secondary | ICD-10-CM

## 2020-01-10 DIAGNOSIS — M961 Postlaminectomy syndrome, not elsewhere classified: Secondary | ICD-10-CM | POA: Diagnosis not present

## 2020-01-10 DIAGNOSIS — M5489 Other dorsalgia: Secondary | ICD-10-CM | POA: Diagnosis not present

## 2020-01-10 DIAGNOSIS — M47896 Other spondylosis, lumbar region: Secondary | ICD-10-CM | POA: Insufficient documentation

## 2020-01-10 DIAGNOSIS — Z5181 Encounter for therapeutic drug level monitoring: Secondary | ICD-10-CM

## 2020-01-10 DIAGNOSIS — M79669 Pain in unspecified lower leg: Secondary | ICD-10-CM | POA: Insufficient documentation

## 2020-01-10 DIAGNOSIS — M5430 Sciatica, unspecified side: Secondary | ICD-10-CM | POA: Insufficient documentation

## 2020-01-10 DIAGNOSIS — M5135 Other intervertebral disc degeneration, thoracolumbar region: Secondary | ICD-10-CM | POA: Diagnosis not present

## 2020-01-10 DIAGNOSIS — M542 Cervicalgia: Secondary | ICD-10-CM | POA: Diagnosis not present

## 2020-01-10 DIAGNOSIS — M5416 Radiculopathy, lumbar region: Secondary | ICD-10-CM

## 2020-01-10 DIAGNOSIS — M47817 Spondylosis without myelopathy or radiculopathy, lumbosacral region: Secondary | ICD-10-CM

## 2020-01-10 DIAGNOSIS — M533 Sacrococcygeal disorders, not elsewhere classified: Secondary | ICD-10-CM

## 2020-01-10 DIAGNOSIS — M7918 Myalgia, other site: Secondary | ICD-10-CM

## 2020-01-10 DIAGNOSIS — M7061 Trochanteric bursitis, right hip: Secondary | ICD-10-CM

## 2020-01-10 DIAGNOSIS — M5414 Radiculopathy, thoracic region: Secondary | ICD-10-CM | POA: Insufficient documentation

## 2020-01-10 DIAGNOSIS — G8929 Other chronic pain: Secondary | ICD-10-CM | POA: Insufficient documentation

## 2020-01-10 DIAGNOSIS — Z79891 Long term (current) use of opiate analgesic: Secondary | ICD-10-CM

## 2020-01-10 MED ORDER — XTAMPZA ER 18 MG PO C12A: 1.0000 | EXTENDED_RELEASE_CAPSULE | Freq: Two times a day (BID) | ORAL | 0 refills | Status: DC

## 2020-01-10 MED ORDER — OXYCODONE-ACETAMINOPHEN 5-325 MG PO TABS: 1.0000 | ORAL_TABLET | Freq: Two times a day (BID) | ORAL | 0 refills | Status: DC

## 2020-01-10 NOTE — Progress Notes (Signed)
Subjective:    Patient ID: David [name], male    DOB: 1976/06/01, 44 y.o.   MRN: [id number]  HPI: David [name] is a 44 y.o. male who returns for follow up appointment for chronic pain and medication refill. He states his pain is located in his neck radiating into his left arm into his left hand with tingling ,mid- lower back pain radiating into his bilateral lower extremities and right hip. He rates his pain 6. His current exercise regime is walking and performing stretching exercises.  Ms. DEMICHAEL TRAUM Morphine equivalent is 72.75 MME.  Last Oral Swab was Performed on 08/11/2019, it was consistent.    Pain Inventory Average Pain 5 Pain Right Now 6 My pain is intermittent, constant, sharp, burning, dull, stabbing, tingling and aching  In the last 24 hours, has pain interfered with the following? General activity 6 Relation with others 6 Enjoyment of life 6 What TIME of day is your pain at its worst? all Sleep (in general) Fair  Pain is worse with: walking, bending, sitting, inactivity, standing and some activites Pain improves with: rest, heat/ice, therapy/exercise, pacing activities, medication, TENS and injections Relief from Meds: 4  Mobility walk without assistance ability to climb steps?  yes do you drive?  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  Neuro/Psych numbness tingling spasms depression anxiety  Prior Studies Any changes since last visit?  no  Physicians involved in your care Any changes since last visit?  no   Family History  Problem Relation Age of Onset  . Arthritis Mother   . Heart disease Mother   . Diabetes Mother   . Heart disease Father   . Diabetes Father    Social History   Socioeconomic History  . Marital status: Married    Spouse name: Not on file  . Number of children: Not on file  . Years of education: Not on file  . Highest education level: Not on file  Occupational History  . Not on file  Tobacco Use   . Smoking status: Never Smoker  . Smokeless tobacco: Never Used  Substance and Sexual Activity  . Alcohol use: No  . Drug use: No  . Sexual activity: Not on file  Other Topics Concern  . Not on file  Social History Narrative  . Not on file   Social Determinants of Health   Financial Resource Strain:   . Difficulty of Paying Living Expenses: Not on file  Food Insecurity:   . Worried About Programme researcher, broadcasting/film/video in the Last Year: Not on file  . Ran Out of Food in the Last Year: Not on file  Transportation Needs:   . Lack of Transportation (Medical): Not on file  . Lack of Transportation (Non-Medical): Not on file  Physical Activity:   . Days of Exercise per Week: Not on file  . Minutes of Exercise per Session: Not on file  Stress:   . Feeling of Stress : Not on file  Social Connections:   . Frequency of Communication with Friends and Family: Not on file  . Frequency of Social Gatherings with Friends and Family: Not on file  . Attends Religious Services: Not on file  . Active Member of Clubs or Organizations: Not on file  . Attends Banker Meetings: Not on file  . Marital Status: Not on file   History reviewed. No pertinent surgical history. Past Medical History:  Diagnosis Date  .  Chronic pain syndrome   . 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 Lumbago   . Pain in joint, lower leg   . Postlaminectomy syndrome, cervical region   . Primary localized osteoarthrosis, lower leg   . Sciatica   . Thoracic radiculopathy    BP 121/81   Pulse (!) 101   Temp (!) 97.5 F (36.4 C)   Resp 14   Ht 6\' 3"  (1.905 m)   Wt 225 lb (102.1 kg)   SpO2 98%   BMI 28.12 kg/m   Opioid Risk Score:   Fall Risk Score:  `1  Depression screen PHQ 2/9  Depression screen [facility] 2/9 03/03/2019 01/13/2019 12/16/2018 08/16/2018 06/24/2018 01/20/2018 05/27/2017  Decreased Interest 1 1 0 1 3 1 1   Down, Depressed, Hopeless 1 1 0 1 3 1 1   PHQ - 2 Score 2 2 0 2 6 2 2     Altered sleeping - - - - 3 - -  Tired, decreased energy - - - - 3 - -  Change in appetite - - - - 0 - -  Feeling bad or failure about yourself  - - - - 0 - -  Trouble concentrating - - - - 0 - -  Moving slowly or fidgety/restless - - - - 0 - -  Suicidal thoughts - - - - 0 - -  PHQ-9 Score - - - - 12 - -  Difficult doing work/chores - - - - Not difficult at all - -    Review of Systems  Constitutional: Negative.   HENT: Negative.   Eyes: Negative.   Respiratory: Negative.   Cardiovascular: Negative.   Gastrointestinal: Negative.   Endocrine: Negative.   Genitourinary: Negative.   Musculoskeletal: Positive for arthralgias, back pain, neck pain and neck stiffness.       Spasms   Skin: Negative.   Allergic/Immunologic: Negative.   Neurological: Positive for numbness.       Tingling   Hematological: Negative.   Psychiatric/Behavioral: Negative.   All other systems reviewed and are negative.      Objective:   Physical Exam Vitals and nursing note reviewed.  Constitutional:      Appearance: Normal appearance.  Neck:     Comments: Cervical Paraspinal Tenderness: C-5-C-6 Cardiovascular:     Rate and Rhythm: Normal rate and regular rhythm.     Pulses: Normal pulses.     Heart sounds: Normal heart sounds.  Pulmonary:     Effort: Pulmonary effort is normal.     Breath sounds: Normal breath sounds.  Musculoskeletal:     Cervical back: Normal range of motion and neck supple.     Comments: Normal Muscle Bulk and Muscle Testing Reveals:  Upper Extremities: Full ROM and Muscle Strength 5/5 Thoracic Paraspinal Tenderness: T-7-T-9  Lumbar Paraspinal Tenderness: L-3-L-5 Right Greater Trochanter Tenderness Lower Extremities: Full ROM and Muscle Strength 5/5 Arises from chair with ease Narrow Based Gait   Skin:    General: Skin is warm and dry.  Neurological:     Mental Status: He is alert and oriented to person, place, and time.  Psychiatric:        Mood and Affect: Mood  normal.        Behavior: Behavior normal.           Assessment & Plan:  1. Cervical postlaminectomy syndrome, Hx of ACDF C4-5, C6-7, C7-T1,02/2008. Continue current medication and alternate using heat and ice therapy.01/10/2020 2. Spondylosis of L-spine .  Continue current treatment regime.01/10/2020 3. Chronic postoperative pain:Refilled:Xtampza 18 mg one capsule every 12 hours for pain #60 andOxycodone 5/325mg  one tablet twice a day, may take a extra tablet when pain is sever# 75. Continue Voltaren Gel/ Flector Patch.01/10/2020 We will continue the opioid monitoring program, this consists of regular clinic visits, examinations, urine drug screen, pill counts as well as use of [place] Controlled Substance Reporting System. 4. Diabetes with neuropathy. Continuecurrent medication regimen withGabapentin.Keep blood sugars under tight control. Continue to monitor.01/10/2020 5.Cervicalgia/Cervical Radiculopathy/ ThoracicRadiculopathy/ LumbarRadiculopathy:Continue Gabapentin.01/10/2020 6.RightGreater Trochanter Bursitis: Continue Ice/Heat and Current medication regimen.01/10/2020 7. Chronic Pain Syndrome/ Myofascial Pain: Continue Flector Patch/ Alternates with Voltaren Gel.01/10/2020 8. Sacroiliac Pain: We discussed SI Injection, literature given. He will think about it and send a My-Chart Message with his answer.   67minutes of face to face patient care time was spent during this visit. All questions were encouraged and answered.  F/U in 1 month

## 2020-01-10 NOTE — Patient Instructions (Signed)
Sacroiliac Joint Injection  A sacroiliac (SI) joint injection is a procedure to inject a numbing medicine (anesthetic block)--and sometimes a strong anti-inflammatory medicine (steroid)--into the SI joint. The SI joint is the joint between two bones of the pelvis called the sacrum and the ilium. The sacrum is the bone at the base of the spine. The ilium is the large bone that forms the hip. You may need this procedure if you have pain because of an inflamed or diseased SI joint. Various conditions can cause pain in the SI joint, including rheumatoid arthritis, gout, psoriatic arthritis, infection, or injury. SI joint pain is a common cause of low back pain. It may also cause pain in your buttock or leg. SI joint injection may be done to:  Find out if an anesthetic block relieves pain. This can confirm that the SI joint is the cause of pain (diagnostic use).  Treat a painful SI joint with steroids, anesthetic medicine, or both (therapeutic use). Tell a health care provider about:  Any allergies you have.  All medicines you are taking, including vitamins, herbs, eye drops, creams, and over-the-counter medicines.  Any problems you or family members have had with anesthetic medicines.  Any blood disorders you have.  Any surgeries you have had.  Any medical conditions you have.  Whether you are pregnant or may be pregnant. What are the risks? Generally, this is a safe procedure. However, problems may occur, including:  Infection.  Bleeding.  Nerve injury.  Temporary increase in pain.  Headache.  Failure of the procedure to relieve pain.  Bruising or soreness at the joint, in deep tissues, or at the injection site.  Allergic reactions to medicines or dyes.  Side effects from the steroid medicine. These may include facial flushing, increased appetite, diarrhea, and increased blood sugar. What happens before the procedure?  You may have a physical exam.  You may have imaging  tests, such as an X-ray, CT scan, or MRI.  Ask your health care provider about: ? Changing or stopping your regular medicines. This is especially important if you are taking diabetes medicines or blood thinners. ? Taking medicines such as aspirin and ibuprofen. These medicines can thin your blood. Do not take these medicines unless your health care provider tells you to take them. ? Taking over-the-counter medicines, vitamins, herbs, and supplements.  Plan to have someone take you home from the hospital or clinic. What happens during the procedure?  To lower your risk of infection: ? Your health care team will wash or sanitize their hands. ? Your skin will be washed with a germ-killing (antiseptic) solution.  You may be given one or more of the following: ? A medicine to help you relax (sedative). ? A medicine to numb the area (local anesthetic). Your health care provider will inject a local anesthetic into the skin above your SI joint.  You will be placed in the proper position on a procedure table to give the health care team the best access to your SI joint.  An X-ray machine that produces moving X-ray images (fluoroscopy) will be placed above the procedure table.  A long, thin needle will be inserted through your skin and down to your SI joint.  The position of the needle will be checked with fluoroscopy imaging.  An X-ray dye (contrast media) will be injected to make sure the needle enters the joint space. You may be asked if you feel any pain.  Long-acting anesthetic medicine will be injected. Long-acting steroid   medicine may also be injected.  The needle will be removed, and a bandage will be placed over the injection site. The procedure may vary among health care providers and hospitals. What happens after the procedure?  Your blood pressure, heart rate, breathing rate, and blood oxygen level will be monitored until the medicines you were given have worn off.  If dye was  used, you will be told to drink plenty of water to wash (flush) the dye out of your body.  You may be asked if you have pain relief from the injection.  You will likely be able to go home shortly after the procedure.  Your health care provider will give you instructions for taking care of yourself after the procedure. These may include instructions for doing physical therapy exercises.  Do not drive for 24 hours if you were given a sedative during the procedure. Summary  A sacroiliac (SI) joint injection is an injection of a numbing medicine (anesthetic block)--and sometimes a strong anti-inflammatory medicine (steroid)--into the SI joint.  You will be awake during the procedure, but the injection area will be made numb.  If you were given a medicine to help you relax (sedative during the procedure, do not drive for at least 24 hours. This information is not intended to replace advice given to you by your health care provider. Make sure you discuss any questions you have with your health care provider. Document Revised: 10/01/2017 Document Reviewed: 07/26/2017 Elsevier Patient Education  2020 [employer] Inc[employer]  

## 2020-02-07 ENCOUNTER — Encounter: Payer: 59 | Attending: Physical Medicine & Rehabilitation | Admitting: Registered Nurse

## 2020-02-07 ENCOUNTER — Other Ambulatory Visit: Payer: Self-pay

## 2020-02-07 VITALS — BP 125/77 | HR 86 | Temp 98.5°F | Ht 75.0 in | Wt 228.0 lb

## 2020-02-07 DIAGNOSIS — M5489 Other dorsalgia: Secondary | ICD-10-CM | POA: Insufficient documentation

## 2020-02-07 DIAGNOSIS — M542 Cervicalgia: Secondary | ICD-10-CM

## 2020-02-07 DIAGNOSIS — E119 Type 2 diabetes mellitus without complications: Secondary | ICD-10-CM | POA: Diagnosis not present

## 2020-02-07 DIAGNOSIS — M47896 Other spondylosis, lumbar region: Secondary | ICD-10-CM | POA: Insufficient documentation

## 2020-02-07 DIAGNOSIS — Z5181 Encounter for therapeutic drug level monitoring: Secondary | ICD-10-CM | POA: Diagnosis present

## 2020-02-07 DIAGNOSIS — M546 Pain in thoracic spine: Secondary | ICD-10-CM

## 2020-02-07 DIAGNOSIS — M5430 Sciatica, unspecified side: Secondary | ICD-10-CM | POA: Insufficient documentation

## 2020-02-07 DIAGNOSIS — M47817 Spondylosis without myelopathy or radiculopathy, lumbosacral region: Secondary | ICD-10-CM

## 2020-02-07 DIAGNOSIS — M7918 Myalgia, other site: Secondary | ICD-10-CM

## 2020-02-07 DIAGNOSIS — G8929 Other chronic pain: Secondary | ICD-10-CM | POA: Diagnosis not present

## 2020-02-07 DIAGNOSIS — Z79891 Long term (current) use of opiate analgesic: Secondary | ICD-10-CM | POA: Diagnosis present

## 2020-02-07 DIAGNOSIS — M5134 Other intervertebral disc degeneration, thoracic region: Secondary | ICD-10-CM | POA: Diagnosis not present

## 2020-02-07 DIAGNOSIS — M179 Osteoarthritis of knee, unspecified: Secondary | ICD-10-CM | POA: Insufficient documentation

## 2020-02-07 DIAGNOSIS — M5416 Radiculopathy, lumbar region: Secondary | ICD-10-CM

## 2020-02-07 DIAGNOSIS — M5135 Other intervertebral disc degeneration, thoracolumbar region: Secondary | ICD-10-CM | POA: Insufficient documentation

## 2020-02-07 DIAGNOSIS — M7061 Trochanteric bursitis, right hip: Secondary | ICD-10-CM

## 2020-02-07 DIAGNOSIS — Z760 Encounter for issue of repeat prescription: Secondary | ICD-10-CM | POA: Diagnosis present

## 2020-02-07 DIAGNOSIS — M961 Postlaminectomy syndrome, not elsewhere classified: Secondary | ICD-10-CM | POA: Insufficient documentation

## 2020-02-07 DIAGNOSIS — M79669 Pain in unspecified lower leg: Secondary | ICD-10-CM | POA: Diagnosis not present

## 2020-02-07 DIAGNOSIS — M25571 Pain in right ankle and joints of right foot: Secondary | ICD-10-CM

## 2020-02-07 DIAGNOSIS — G894 Chronic pain syndrome: Secondary | ICD-10-CM | POA: Diagnosis not present

## 2020-02-07 DIAGNOSIS — M5414 Radiculopathy, thoracic region: Secondary | ICD-10-CM | POA: Insufficient documentation

## 2020-02-07 DIAGNOSIS — M5412 Radiculopathy, cervical region: Secondary | ICD-10-CM

## 2020-02-07 MED ORDER — XTAMPZA ER 18 MG PO C12A: 1.0000 | EXTENDED_RELEASE_CAPSULE | Freq: Two times a day (BID) | ORAL | 0 refills | Status: DC

## 2020-02-07 MED ORDER — OXYCODONE-ACETAMINOPHEN 5-325 MG PO TABS: 1.0000 | ORAL_TABLET | Freq: Two times a day (BID) | ORAL | 0 refills | Status: DC

## 2020-02-07 NOTE — Progress Notes (Signed)
Subjective:    Patient ID: David [name], male    DOB: 17-Feb-1976, 44 y.o.   MRN: [id number]  HPI: David [name] is a 44 y.o. male who returns for follow up appointment for chronic pain and medication refill. He states his pain is located in his neck mainly on the right side radiating into his left arm pain with tingling, burning and numbness, mid - lower back pain radiating into his thoracic region and lower extremities. Also reports right hip and right ankle pain. He rates his pain 5. His current exercise regime is walking and performing stretching exercises.  [name] Morphine equivalent is 72.75 MME. He is also prescribed Diazepam  By [facility].We have discussed the black box warning of using opioids and benzodiazepines. I highlighted the dangers of using these drugs together and discussed the adverse events including respiratory suppression, overdose, cognitive impairment and importance of compliance with current regimen. We will continue to monitor and adjust as indicated.    Oral Swab was Performed Today.   Pain Inventory Average Pain 5 Pain Right Now 5 My pain is intermittent, constant, sharp, burning, dull, stabbing, tingling and aching  In the last 24 hours, has pain interfered with the following? General activity 6 Relation with others 5 Enjoyment of life 6 What TIME of day is your pain at its worst? all Sleep (in general) Fair  Pain is worse with: walking, bending, sitting, inactivity, standing and some activites Pain improves with: rest, heat/ice, therapy/exercise, pacing activities, medication and injections Relief from Meds: 4  Mobility how many minutes can you walk? 45 ability to climb steps?  yes do you drive?  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  no x-rays CT/MRI nerve study  Physicians involved in your care Any  changes since last visit?  no Primary care [facility]   Family History  Problem Relation Age of Onset  . Arthritis Mother   . Heart disease Mother   . Diabetes Mother   . Heart disease Father   . Diabetes Father    Social History   Socioeconomic History  . Marital status: Married    Spouse name: Not on file  . Number of children: Not on file  . Years of education: Not on file  . Highest education level: Not on file  Occupational History  . Not on file  Tobacco Use  . Smoking status: Never Smoker  . Smokeless tobacco: Never Used  Substance and Sexual Activity  . Alcohol use: No  . Drug use: No  . Sexual activity: Not on file  Other Topics Concern  . Not on file  Social History Narrative  . Not on file   Social Determinants of Health   Financial Resource Strain:   . Difficulty of Paying Living Expenses:   Food Insecurity:   . Worried About Charity fundraiser in the Last Year:   . Arboriculturist in the Last Year:   Transportation Needs:   . Film/video editor (Medical):   [name] Lack of Transportation (Non-Medical):   Physical Activity:   . Days of Exercise per Week:   . Minutes of Exercise per Session:   Stress:   . Feeling of Stress :   Social Connections:   . Frequency of Communication with Friends and Family:   . Frequency of Social Gatherings with  Friends and Family:   . Attends Religious Services:   . Active Member of Clubs or Organizations:   . Attends Banker Meetings:   [name] Marital Status:    No past surgical history on file. Past Medical History:  Diagnosis Date  . Chronic pain syndrome   . 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 Lumbago   . Pain in joint, lower leg   . Postlaminectomy syndrome, cervical region   . Primary localized osteoarthrosis, lower leg   . Sciatica   . Thoracic radiculopathy    BP 125/77   Pulse 86   Temp 98.5 F (36.9 C)   Ht 6\' 3"  (1.905 m)   Wt 228 lb (103.4 kg)   SpO2  97%   BMI 28.50 kg/m   Opioid Risk Score: Fall Risk Score: `1  Depression screen PHQ 2/9  Depression screen [facility] 2/9 03/03/2019 01/13/2019 12/16/2018 08/16/2018 06/24/2018 01/20/2018 05/27/2017  Decreased Interest 1 1 0 1 3 1 1   Down, Depressed, Hopeless 1 1 0 1 3 1 1   PHQ - 2 Score 2 2 0 2 6 2 2   Altered sleeping - - - - 3 - -  Tired, decreased energy - - - - 3 - -  Change in appetite - - - - 0 - -  Feeling bad or failure about yourself  - - - - 0 - -  Trouble concentrating - - - - 0 - -  Moving slowly or fidgety/restless - - - - 0 - -  Suicidal thoughts - - - - 0 - -  PHQ-9 Score - - - - 12 - -  Difficult doing work/chores - - - - Not difficult at all - -    Review of Systems  Neurological: Positive for numbness.  Psychiatric/Behavioral: Positive for dysphoric mood. The patient is nervous/anxious.   All other systems reviewed and are negative.      Objective:   Physical Exam Vitals and nursing note reviewed.  Constitutional:      Appearance: Normal appearance.  Neck:     Comments: Cervical Paraspinal Tenderness: C-5-C-6 Cardiovascular:     Rate and Rhythm: Normal rate and regular rhythm.     Pulses: Normal pulses.     Heart sounds: Normal heart sounds.  Pulmonary:     Effort: Pulmonary effort is normal.     Breath sounds: Normal breath sounds.  Musculoskeletal:     Cervical back: Normal range of motion and neck supple.     Comments: Normal Muscle Bulk and Muscle Testing Reveals:  Upper Extremities: Full ROM and Muscle Strength 5/5 Thoracic and Lumbar Hypersensitivity Right Greater Trochanter Tenderness Lower Extremities: Full ROM and Muscle Strength 5/5 Arises from chair with ease Narrow Based  Gait   Skin:    General: Skin is warm and dry.  Neurological:     Mental Status: He is alert and oriented to person, place, and time.  Psychiatric:        Mood and Affect: Mood normal.        Behavior: Behavior normal.           Assessment & Plan:  1. Cervical  postlaminectomy syndrome, Hx of ACDF C4-5, C6-7, C7-T1,02/2008. Continue current medication and alternate using heat and ice therapy.0407/2021 2. Spondylosis of L-spine . Continue current treatment regime.02/07/2020 3. Chronic postoperative pain:Refilled:Xtampza 18 mg one capsule every 12 hours for pain #60 andOxycodone 5/325mg  one tablet twice a day, may take a extra tablet when  pain is sever# 75. Continue Voltaren Gel/ Flector Patch.02/07/2020 We will continue the opioid monitoring program, this consists of regular clinic visits, examinations, urine drug screen, pill counts as well as use of [place] Controlled Substance Reporting System. 4. Diabetes with neuropathy. Continuecurrent medication regimen withGabapentin.Keep blood sugars under tight control. Continue to monitor.02/07/2020 5.Cervicalgia/Cervical Radiculopathy/ ThoracicRadiculopathy/ LumbarRadiculopathy:Continue Gabapentin.02/07/2020 6.RightGreater Trochanter Bursitis: Continue Ice/Heat and Current medication regimen.02/07/2020 7. Chronic Pain Syndrome/ Myofascial Pain: Continue Flector Patch/ Alternates with Voltaren Gel.02/07/2020 8. Sacroiliac Pain: No complaints today. We discussed SI Injection, literature given. He will think about it and send a My-Chart Message with his answer.  9. Chronic Right Ankle Pain: Continue HEP as tolerated. Continue to Monitor.   of face to face patient care time was spent during this visit. All questions were encouraged and answered.  F/U in 1 month

## 2020-02-11 ENCOUNTER — Encounter: Payer: Self-pay | Admitting: Registered Nurse

## 2020-02-12 LAB — DRUG TOX MONITOR 1 W/CONF, ORAL FLD
Alprazolam: NEGATIVE ng/mL (ref <0.50)
Amphetamines: NEGATIVE ng/mL (ref <10)
Barbiturates: NEGATIVE ng/mL (ref <10)
Benzodiazepines: POSITIVE ng/mL — AB (ref <0.50)
Buprenorphine: NEGATIVE ng/mL (ref <0.10)
Chlordiazepoxide: NEGATIVE ng/mL (ref <0.50)
Clonazepam: NEGATIVE ng/mL (ref <0.50)
Cocaine: NEGATIVE ng/mL (ref <5.0)
Codeine: NEGATIVE ng/mL (ref <2.5)
Diazepam: 1.06 ng/mL — ABNORMAL HIGH (ref <0.50)
Dihydrocodeine: NEGATIVE ng/mL (ref <2.5)
Fentanyl: NEGATIVE ng/mL (ref <0.10)
Flunitrazepam: NEGATIVE ng/mL (ref <0.50)
Flurazepam: NEGATIVE ng/mL (ref <0.50)
Heroin Metabolite: NEGATIVE ng/mL (ref <1.0)
Hydrocodone: NEGATIVE ng/mL (ref <2.5)
Hydromorphone: NEGATIVE ng/mL (ref <2.5)
Lorazepam: NEGATIVE ng/mL (ref <0.50)
MARIJUANA: NEGATIVE ng/mL (ref <2.5)
MDMA: NEGATIVE ng/mL (ref <10)
Meprobamate: NEGATIVE ng/mL (ref <2.5)
Methadone: NEGATIVE ng/mL (ref <5.0)
Midazolam: NEGATIVE ng/mL (ref <0.50)
Morphine: NEGATIVE ng/mL (ref <2.5)
Nicotine Metabolite: NEGATIVE ng/mL (ref <5.0)
Nordiazepam: 2.45 ng/mL — ABNORMAL HIGH (ref <0.50)
Norhydrocodone: NEGATIVE ng/mL (ref <2.5)
Noroxycodone: 33.9 ng/mL — ABNORMAL HIGH (ref <2.5)
Opiates: POSITIVE ng/mL — AB (ref <2.5)
Oxazepam: NEGATIVE ng/mL (ref <0.50)
Oxycodone: >250 ng/mL — ABNORMAL HIGH (ref <2.5)
Oxymorphone: 2.5 ng/mL — ABNORMAL HIGH (ref <2.5)
Phencyclidine: NEGATIVE ng/mL (ref <10)
Tapentadol: NEGATIVE ng/mL (ref <5.0)
Temazepam: NEGATIVE ng/mL (ref <0.50)
Tramadol: NEGATIVE ng/mL (ref <5.0)
Triazolam: NEGATIVE ng/mL (ref <0.50)
Zolpidem: NEGATIVE ng/mL (ref <5.0)

## 2020-02-12 LAB — DRUG TOX ALC METAB W/CON, ORAL FLD: Alcohol Metabolite: NEGATIVE ng/mL (ref <25)

## 2020-02-19 ENCOUNTER — Telehealth: Payer: Self-pay | Admitting: *Deleted

## 2020-02-19 NOTE — Telephone Encounter (Signed)
Oral swab drug screen was consistent for prescribed medications.  ?

## 2020-03-06 ENCOUNTER — Encounter: Payer: Self-pay | Admitting: Registered Nurse

## 2020-03-06 ENCOUNTER — Encounter
Payer: No Typology Code available for payment source | Attending: Physical Medicine & Rehabilitation | Admitting: Registered Nurse

## 2020-03-06 ENCOUNTER — Other Ambulatory Visit: Payer: Self-pay

## 2020-03-06 VITALS — BP 123/72 | HR 82 | Temp 97.5°F | Ht 75.0 in | Wt 226.8 lb

## 2020-03-06 DIAGNOSIS — M47817 Spondylosis without myelopathy or radiculopathy, lumbosacral region: Secondary | ICD-10-CM

## 2020-03-06 DIAGNOSIS — M546 Pain in thoracic spine: Secondary | ICD-10-CM

## 2020-03-06 DIAGNOSIS — M5414 Radiculopathy, thoracic region: Secondary | ICD-10-CM | POA: Diagnosis not present

## 2020-03-06 DIAGNOSIS — M961 Postlaminectomy syndrome, not elsewhere classified: Secondary | ICD-10-CM

## 2020-03-06 DIAGNOSIS — E119 Type 2 diabetes mellitus without complications: Secondary | ICD-10-CM | POA: Diagnosis not present

## 2020-03-06 DIAGNOSIS — G8929 Other chronic pain: Secondary | ICD-10-CM

## 2020-03-06 DIAGNOSIS — M79669 Pain in unspecified lower leg: Secondary | ICD-10-CM | POA: Diagnosis not present

## 2020-03-06 DIAGNOSIS — M5430 Sciatica, unspecified side: Secondary | ICD-10-CM | POA: Insufficient documentation

## 2020-03-06 DIAGNOSIS — M5416 Radiculopathy, lumbar region: Secondary | ICD-10-CM

## 2020-03-06 DIAGNOSIS — M7061 Trochanteric bursitis, right hip: Secondary | ICD-10-CM

## 2020-03-06 DIAGNOSIS — M5135 Other intervertebral disc degeneration, thoracolumbar region: Secondary | ICD-10-CM | POA: Insufficient documentation

## 2020-03-06 DIAGNOSIS — M5412 Radiculopathy, cervical region: Secondary | ICD-10-CM | POA: Diagnosis not present

## 2020-03-06 DIAGNOSIS — Z5181 Encounter for therapeutic drug level monitoring: Secondary | ICD-10-CM

## 2020-03-06 DIAGNOSIS — M5134 Other intervertebral disc degeneration, thoracic region: Secondary | ICD-10-CM | POA: Diagnosis not present

## 2020-03-06 DIAGNOSIS — Z79891 Long term (current) use of opiate analgesic: Secondary | ICD-10-CM

## 2020-03-06 DIAGNOSIS — M47896 Other spondylosis, lumbar region: Secondary | ICD-10-CM | POA: Insufficient documentation

## 2020-03-06 DIAGNOSIS — G894 Chronic pain syndrome: Secondary | ICD-10-CM | POA: Diagnosis present

## 2020-03-06 DIAGNOSIS — M179 Osteoarthritis of knee, unspecified: Secondary | ICD-10-CM | POA: Diagnosis not present

## 2020-03-06 DIAGNOSIS — M542 Cervicalgia: Secondary | ICD-10-CM

## 2020-03-06 DIAGNOSIS — M7918 Myalgia, other site: Secondary | ICD-10-CM

## 2020-03-06 DIAGNOSIS — Z760 Encounter for issue of repeat prescription: Secondary | ICD-10-CM | POA: Diagnosis present

## 2020-03-06 DIAGNOSIS — M5489 Other dorsalgia: Secondary | ICD-10-CM | POA: Diagnosis not present

## 2020-03-06 DIAGNOSIS — M25571 Pain in right ankle and joints of right foot: Secondary | ICD-10-CM

## 2020-03-06 MED ORDER — OXYCODONE-ACETAMINOPHEN 5-325 MG PO TABS: 1.0000 | ORAL_TABLET | Freq: Two times a day (BID) | ORAL | 0 refills | Status: DC

## 2020-03-06 MED ORDER — XTAMPZA ER 18 MG PO C12A: 1.0000 | EXTENDED_RELEASE_CAPSULE | Freq: Two times a day (BID) | ORAL | 0 refills | Status: DC

## 2020-03-06 NOTE — Progress Notes (Signed)
Subjective:    Patient ID: David [name], male    DOB: 1976/08/18, 44 y.o.   MRN: [id number]  HPI: David [name] is a 44 y.o. male who returns for follow up appointment for chronic pain and medication refill. He states his pain is located in his neck radiating into his bilateral shoulders, mid- lower back pain radiating into his right hip and bilateral lower extremities and right ankle pain. He rates his pain 5. His  current exercise regime is walking and performing stretching exercises.  David [name] Morphine equivalent is 76.50  MME.    Last Oral Swab was Performed on 02/07/2020, it was consistent.    Pain Inventory Average Pain 5 Pain Right Now 5 My pain is intermittent, David, sharp, burning, dull, stabbing, tingling and aching  In the last 24 hours, has pain interfered with the following? General activity 6 Relation with others 5 Enjoyment of life 6 What TIME of day is your pain at its worst? all Sleep (in general) Poor  Pain is worse with: walking, bending, sitting, inactivity, standing and some activites Pain improves with: rest, heat/ice, therapy/exercise, pacing activities, medication and TENS Relief from Meds: 4  Mobility walk without assistance how many minutes can you walk? 45 ability to climb steps?  yes do you drive?  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  no  Physicians involved in your care Any changes since last visit?  no   Family History  Problem Relation Age of Onset  . Arthritis Mother   . Heart disease Mother   . Diabetes Mother   . Heart disease Father   . Diabetes Father    Social History   Socioeconomic History  . Marital status: Married    Spouse name: Not on file  . Number of children: Not on file  . Years of education: Not on file  . Highest education level: Not on file  Occupational History  .  Not on file  Tobacco Use  . Smoking status: Never Smoker  . Smokeless tobacco: Never Used  Substance and Sexual Activity  . Alcohol use: No  . Drug use: No  . Sexual activity: Not on file  Other Topics Concern  . Not on file  Social History Narrative  . Not on file   Social Determinants of Health   Financial Resource Strain:   . Difficulty of Paying Living Expenses:   Food Insecurity:   . Worried About Programme researcher, broadcasting/film/video in the Last Year:   . Barista in the Last Year:   Transportation Needs:   . Freight forwarder (Medical):   [name] Lack of Transportation (Non-Medical):   Physical Activity:   . Days of Exercise per Week:   . Minutes of Exercise per Session:   Stress:   . Feeling of Stress :   Social Connections:   . Frequency of Communication with Friends and Family:   . Frequency of Social Gatherings with Friends and Family:   . Attends Religious Services:   . Active Member of Clubs or Organizations:   . Attends Banker Meetings:   [name] Marital Status:    No past surgical history on file. Past Medical History:  Diagnosis Date  . Chronic pain syndrome   . 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 Lumbago   . Pain  in joint, lower leg   . Postlaminectomy syndrome, cervical region   . Primary localized osteoarthrosis, lower leg   . Sciatica   . Thoracic radiculopathy    BP 123/72   Pulse 82   Temp (!) 97.5 F (36.4 C)   Ht 6\' 3"  (1.905 m)   Wt 226 lb 12.8 oz (102.9 kg)   SpO2 97%   BMI 28.35 kg/m   Opioid Risk Score:   Fall Risk Score:  `1  Depression screen PHQ 2/9  Depression screen [facility] 2/9 03/06/2020 03/03/2019 01/13/2019 12/16/2018 08/16/2018 06/24/2018 01/20/2018  Decreased Interest 1 1 1  0 1 3 1   Down, Depressed, Hopeless 1 1 1  0 1 3 1   PHQ - 2 Score 2 2 2  0 2 6 2   Altered sleeping - - - - - 3 -  Tired, decreased energy - - - - - 3 -  Change in appetite - - - - - 0 -  Feeling bad or failure about yourself  - -  - - - 0 -  Trouble concentrating - - - - - 0 -  Moving slowly or fidgety/restless - - - - - 0 -  Suicidal thoughts - - - - - 0 -  PHQ-9 Score - - - - - 12 -  Difficult doing work/chores - - - - - Not difficult at all -     Review of Systems  Constitutional: Negative.   HENT: Negative.   Eyes: Negative.   Respiratory: Negative.   Cardiovascular: Negative.   Gastrointestinal: Negative.   Endocrine: Negative.   Genitourinary: Negative.   Musculoskeletal:       Spasms  Skin: Negative.   Neurological: Positive for numbness.       Tingling  Hematological: Negative.   Psychiatric/Behavioral: Positive for dysphoric mood. The patient is nervous/anxious.   All other systems reviewed and are negative.      Objective:   Physical Exam Vitals and nursing note reviewed.  Constitutional:      Appearance: Normal appearance.  Neck:     Comments: Cervical Paraspinal Tenderness: C-5-C-6 Cardiovascular:     Rate and Rhythm: Normal rate and regular rhythm.     Pulses: Normal pulses.     Heart sounds: Normal heart sounds.  Pulmonary:     Effort: Pulmonary effort is normal.     Breath sounds: Normal breath sounds.  Musculoskeletal:     Cervical back: Normal range of motion and neck supple.     Comments: Normal Muscle Bulk and Muscle Testing Reveals:  Upper Extremities: Full ROM and Muscle Strength 5/5 Bilateral AC Joint Tenderness  Thoracic Paraspinal Tenderness: T-7-T-9  Lumbar Hypersensitivity Right Greater Trochanter Tenderness Lower Extremities: Full ROM and Muscle Strength 5/5 Arises from chair with ease Narrow Based Gait   Skin:    General: Skin is warm and dry.  Neurological:     Mental Status: He is alert and oriented to person, place, and time.  Psychiatric:        Mood and Affect: Mood normal.        Behavior: Behavior normal.           Assessment & Plan:  1. Cervical postlaminectomy syndrome, Hx of ACDF C4-5, C6-7, C7-T1,02/2008. Continue current medication and  alternate using heat and ice therapy.03/06/2020 2. Spondylosis of L-spine . Continue current treatment regime.03/06/2020 3. Chronic postoperative pain:Refilled:Xtampza 18 mg one capsule every 12 hours for pain #60 andOxycodone 5/325mg  one tablet twice a day, may take a extra tablet  when pain is sever# 75. Continue Voltaren Gel/ Flector Patch.03/06/2020 We will continue the opioid monitoring program, this consists of regular clinic visits, examinations, urine drug screen, pill counts as well as use of [place] Controlled Substance Reporting System. 4. Diabetes with neuropathy. Continuecurrent medication regimen withGabapentin.Keep blood sugars under tight control. Continue to monitor.03/06/2020 5.Cervicalgia/Cervical Radiculopathy/ ThoracicRadiculopathy/ LumbarRadiculopathy:Continue Gabapentin.03/06/2020 6.RightGreater Trochanter Bursitis: Continue Ice/Heat and Current medication regimen.03/06/2020 7. Chronic Pain Syndrome/ Myofascial Pain: Continue Flector Patch/ Alternates with Voltaren Gel.03/06/2020 8. Sacroiliac Pain: No complaints today. Continue to Monitor.03/06/2020 9. Chronic Right Ankle Pain: Continue HEP as tolerated. Continue to Monitor. 03/06/2020.  71minutes of face to face patient care time was spent during this visit. All questions were encouraged and answered.  F/U in 1 month

## 2020-03-08 ENCOUNTER — Other Ambulatory Visit: Payer: Self-pay | Admitting: Physical Medicine & Rehabilitation

## 2020-04-08 ENCOUNTER — Encounter
Payer: No Typology Code available for payment source | Attending: Physical Medicine & Rehabilitation | Admitting: Registered Nurse

## 2020-04-08 ENCOUNTER — Other Ambulatory Visit: Payer: Self-pay

## 2020-04-08 ENCOUNTER — Encounter: Payer: Self-pay | Admitting: Registered Nurse

## 2020-04-08 VITALS — BP 111/70 | HR 94 | Temp 97.7°F | Wt 226.0 lb

## 2020-04-08 DIAGNOSIS — M542 Cervicalgia: Secondary | ICD-10-CM

## 2020-04-08 DIAGNOSIS — M5489 Other dorsalgia: Secondary | ICD-10-CM | POA: Diagnosis not present

## 2020-04-08 DIAGNOSIS — M5416 Radiculopathy, lumbar region: Secondary | ICD-10-CM

## 2020-04-08 DIAGNOSIS — M5134 Other intervertebral disc degeneration, thoracic region: Secondary | ICD-10-CM | POA: Insufficient documentation

## 2020-04-08 DIAGNOSIS — M47817 Spondylosis without myelopathy or radiculopathy, lumbosacral region: Secondary | ICD-10-CM

## 2020-04-08 DIAGNOSIS — E119 Type 2 diabetes mellitus without complications: Secondary | ICD-10-CM | POA: Insufficient documentation

## 2020-04-08 DIAGNOSIS — M5412 Radiculopathy, cervical region: Secondary | ICD-10-CM

## 2020-04-08 DIAGNOSIS — Z79891 Long term (current) use of opiate analgesic: Secondary | ICD-10-CM

## 2020-04-08 DIAGNOSIS — M7918 Myalgia, other site: Secondary | ICD-10-CM

## 2020-04-08 DIAGNOSIS — Z5181 Encounter for therapeutic drug level monitoring: Secondary | ICD-10-CM | POA: Diagnosis present

## 2020-04-08 DIAGNOSIS — G894 Chronic pain syndrome: Secondary | ICD-10-CM

## 2020-04-08 DIAGNOSIS — M5430 Sciatica, unspecified side: Secondary | ICD-10-CM | POA: Diagnosis not present

## 2020-04-08 DIAGNOSIS — M47896 Other spondylosis, lumbar region: Secondary | ICD-10-CM | POA: Insufficient documentation

## 2020-04-08 DIAGNOSIS — M79669 Pain in unspecified lower leg: Secondary | ICD-10-CM | POA: Insufficient documentation

## 2020-04-08 DIAGNOSIS — G8929 Other chronic pain: Secondary | ICD-10-CM

## 2020-04-08 DIAGNOSIS — M5414 Radiculopathy, thoracic region: Secondary | ICD-10-CM | POA: Diagnosis not present

## 2020-04-08 DIAGNOSIS — M7061 Trochanteric bursitis, right hip: Secondary | ICD-10-CM

## 2020-04-08 DIAGNOSIS — M961 Postlaminectomy syndrome, not elsewhere classified: Secondary | ICD-10-CM | POA: Diagnosis not present

## 2020-04-08 DIAGNOSIS — M546 Pain in thoracic spine: Secondary | ICD-10-CM

## 2020-04-08 DIAGNOSIS — M5135 Other intervertebral disc degeneration, thoracolumbar region: Secondary | ICD-10-CM | POA: Insufficient documentation

## 2020-04-08 DIAGNOSIS — Z760 Encounter for issue of repeat prescription: Secondary | ICD-10-CM | POA: Diagnosis present

## 2020-04-08 DIAGNOSIS — M179 Osteoarthritis of knee, unspecified: Secondary | ICD-10-CM | POA: Diagnosis not present

## 2020-04-08 MED ORDER — XTAMPZA ER 18 MG PO C12A: 1.0000 | EXTENDED_RELEASE_CAPSULE | Freq: Two times a day (BID) | ORAL | 0 refills | Status: DC

## 2020-04-08 MED ORDER — OXYCODONE-ACETAMINOPHEN 5-325 MG PO TABS: 1.0000 | ORAL_TABLET | Freq: Two times a day (BID) | ORAL | 0 refills | Status: DC

## 2020-04-08 NOTE — Progress Notes (Signed)
Subjective:    Patient ID: David [name], male    DOB: Mar 23, 1976, 44 y.o.   MRN: [id number]  HPI: David [name] is a 44 y.o. male who returns for follow up appointment for chronic pain and medication refill. He states his pain is located in his neck radiating into his left shoulder, upper- lower back pain radiating into his bilateral lower extremities L>R and right hip and right ankle pain. He rates his pain 6. His current exercise regime is walking and performing stretching exercises.  Ms. Vondrak Morphine equivalent is 76.50  MME.He is also prescribed  Diazepam by Dr. Elyn Peers. We have discussed the black box warning of using opioids and benzodiazepines. I highlighted the dangers of using these drugs together and discussed the adverse events including respiratory suppression, overdose, cognitive impairment and importance of compliance with current regimen. We will continue to monitor and adjust as indicated.  ing.   Last Oral Swab was Performed on 02/07/2020, it was consistent.    Pain Inventory Average Pain 5 Pain Right Now 6 My pain is intermittent, sharp, burning, dull, stabbing, tingling and aching  In the last 24 hours, has pain interfered with the following? General activity 6 Relation with others 5 Enjoyment of life 6 What TIME of day is your pain at its worst? 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  yes x-rays CT/MRI nerve study  Physicians involved in your care Any changes since last visit?  no   Family History  Problem Relation Age of Onset  . Arthritis Mother   . Heart disease  Mother   . Diabetes Mother   . Heart disease Father   . Diabetes Father    Social History   Socioeconomic History  . Marital status: Married    Spouse name: Not on file  . Number of children: Not on file  . Years of education: Not on file  . Highest education level: Not on file  Occupational History  . Not on file  Tobacco Use  . Smoking status: Never Smoker  . Smokeless tobacco: Never Used  Substance and Sexual Activity  . Alcohol use: No  . Drug use: No  . Sexual activity: Not on file  Other Topics Concern  . Not on file  Social History Narrative  . Not on file   Social Determinants of Health   Financial Resource Strain:   . Difficulty of Paying Living Expenses:   Food Insecurity:   . Worried About Programme researcher, broadcasting/film/video in the Last Year:   . Barista in the Last Year:   Transportation Needs:   . Freight forwarder (Medical):   [name] Lack of Transportation (Non-Medical):   Physical Activity:   . Days of Exercise per Week:   . Minutes of Exercise per Session:   Stress:   . Feeling of Stress :   Social Connections:   . Frequency of Communication with Friends and Family:   . Frequency of Social Gatherings with Friends and Family:   . Attends Religious Services:   . Active Member of Clubs or Organizations:   . Attends Banker Meetings:   .  Marital Status:    No past surgical history on file. Past Medical History:  Diagnosis Date  . Chronic pain syndrome   . 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 Lumbago   . Pain in joint, lower leg   . Postlaminectomy syndrome, cervical region   . Primary localized osteoarthrosis, lower leg   . Sciatica   . Thoracic radiculopathy    There were no vitals taken for this visit.  Opioid Risk Score:   Fall Risk Score:  `1  Depression screen PHQ 2/9  Depression screen [facility] 2/9 03/06/2020 03/03/2019 01/13/2019 12/16/2018 08/16/2018 06/24/2018 01/20/2018  Decreased Interest 1 1 1  0 1 3 1     Down, Depressed, Hopeless 1 1 1  0 1 3 1   PHQ - 2 Score 2 2 2  0 2 6 2   Altered sleeping - - - - - 3 -  Tired, decreased energy - - - - - 3 -  Change in appetite - - - - - 0 -  Feeling bad or failure about yourself  - - - - - 0 -  Trouble concentrating - - - - - 0 -  Moving slowly or fidgety/restless - - - - - 0 -  Suicidal thoughts - - - - - 0 -  PHQ-9 Score - - - - - 12 -  Difficult doing work/chores - - - - - Not difficult at all -   Review of Systems  Constitutional: Negative.   HENT: Negative.   Eyes: Negative.   Respiratory: Negative.   Cardiovascular: Negative.   Gastrointestinal: Negative.   Endocrine: Negative.   Genitourinary: Negative.   Musculoskeletal: Positive for arthralgias, back pain, myalgias, neck pain and neck stiffness.  Allergic/Immunologic: Negative.   Neurological: Positive for numbness.       [place]  Psychiatric/Behavioral: Positive for dysphoric mood. The patient is nervous/anxious.        Objective:   Physical Exam Vitals and nursing note reviewed.  Constitutional:      Appearance: Normal appearance.  Neck:     Comments: Cervical Paraspinal Tenderness: C-5-C-6 Cardiovascular:     Rate and Rhythm: Normal rate and regular rhythm.     Pulses: Normal pulses.     Heart sounds: Normal heart sounds.  Pulmonary:     Effort: Pulmonary effort is normal.     Breath sounds: Normal breath sounds.  Musculoskeletal:     Cervical back: Normal range of motion and neck supple.     Comments: Normal Muscle Bulk and Muscle Testing Reveals:  Upper Extremities: Full ROM and Muscle Strength 5/5 Bilateral AC Joint Tenderness  Thoracic and Lumbar Hypersensitivity Right Greater Trochanteric tenderness Lower Extremities: Full ROM and Muscle Strength 5/5 Arises from chair with ease Narrow Based  Gait   Skin:    General: Skin is warm and dry.  Neurological:     Mental Status: He is alert and oriented to person, place, and time.  Psychiatric:        Mood and  Affect: Mood normal.        Behavior: Behavior normal.           Assessment & Plan:  1. Cervical postlaminectomy syndrome, Hx of ACDF C4-5, C6-7, C7-T1,02/2008. Continue current medication and alternate using heat and ice therapy.04/08/2020 2. Spondylosis of L-spine . Continue current treatment regime.03/06/2020 3. Chronic postoperative pain:Refilled:Xtampza 18 mg one capsule every 12 hours for pain #60 andOxycodone 5/325mg  one tablet twice a day, may take a extra tablet when pain  is sever# 75. Continue Voltaren Gel/ Flector Patch.04/08/2020 We will continue the opioid monitoring program, this consists of regular clinic visits, examinations, urine drug screen, pill counts as well as use of [place] Controlled Substance Reporting System. 4. Diabetes with neuropathy. Continuecurrent medication regimen withGabapentin.Keep blood sugars under tight control. Continue to monitor.04/08/2020 5.Cervicalgia/Cervical Radiculopathy/ ThoracicRadiculopathy/ LumbarRadiculopathy:Continue Gabapentin.04/08/2020 6.RightGreater Trochanter Bursitis: Continue Ice/Heat and Current medication regimen.04/08/2020 7. Chronic Pain Syndrome/ Myofascial Pain: Continue Flector Patch/ Alternates with Voltaren Gel.04/08/2020 8. Sacroiliac Pain:No complaints today.Continue to Monitor.04/08/2020 9. Chronic Right Ankle Pain: Continue HEP as tolerated. Continue to Monitor.04/08/2020.  of face to face patient care time was spent during this visit. All questions were encouraged and answered.  F/U in 1 month

## 2020-04-09 ENCOUNTER — Encounter: Payer: Self-pay | Admitting: Registered Nurse

## 2020-04-10 ENCOUNTER — Encounter: Payer: No Typology Code available for payment source | Admitting: Registered Nurse

## 2020-05-08 ENCOUNTER — Other Ambulatory Visit: Payer: Self-pay

## 2020-05-08 ENCOUNTER — Encounter: Payer: Self-pay | Admitting: Registered Nurse

## 2020-05-08 ENCOUNTER — Encounter
Payer: No Typology Code available for payment source | Attending: Physical Medicine & Rehabilitation | Admitting: Registered Nurse

## 2020-05-08 VITALS — BP 119/76 | HR 87 | Temp 98.4°F | Ht 75.0 in | Wt 222.0 lb

## 2020-05-08 DIAGNOSIS — G894 Chronic pain syndrome: Secondary | ICD-10-CM | POA: Diagnosis present

## 2020-05-08 DIAGNOSIS — M47896 Other spondylosis, lumbar region: Secondary | ICD-10-CM | POA: Diagnosis not present

## 2020-05-08 DIAGNOSIS — M5430 Sciatica, unspecified side: Secondary | ICD-10-CM | POA: Insufficient documentation

## 2020-05-08 DIAGNOSIS — M79669 Pain in unspecified lower leg: Secondary | ICD-10-CM | POA: Diagnosis not present

## 2020-05-08 DIAGNOSIS — M542 Cervicalgia: Secondary | ICD-10-CM

## 2020-05-08 DIAGNOSIS — Z760 Encounter for issue of repeat prescription: Secondary | ICD-10-CM | POA: Insufficient documentation

## 2020-05-08 DIAGNOSIS — M179 Osteoarthritis of knee, unspecified: Secondary | ICD-10-CM | POA: Insufficient documentation

## 2020-05-08 DIAGNOSIS — M47817 Spondylosis without myelopathy or radiculopathy, lumbosacral region: Secondary | ICD-10-CM

## 2020-05-08 DIAGNOSIS — M25571 Pain in right ankle and joints of right foot: Secondary | ICD-10-CM

## 2020-05-08 DIAGNOSIS — M546 Pain in thoracic spine: Secondary | ICD-10-CM | POA: Diagnosis not present

## 2020-05-08 DIAGNOSIS — M7061 Trochanteric bursitis, right hip: Secondary | ICD-10-CM

## 2020-05-08 DIAGNOSIS — M7918 Myalgia, other site: Secondary | ICD-10-CM

## 2020-05-08 DIAGNOSIS — M5416 Radiculopathy, lumbar region: Secondary | ICD-10-CM

## 2020-05-08 DIAGNOSIS — G8929 Other chronic pain: Secondary | ICD-10-CM | POA: Diagnosis not present

## 2020-05-08 DIAGNOSIS — M5135 Other intervertebral disc degeneration, thoracolumbar region: Secondary | ICD-10-CM | POA: Diagnosis not present

## 2020-05-08 DIAGNOSIS — M961 Postlaminectomy syndrome, not elsewhere classified: Secondary | ICD-10-CM | POA: Insufficient documentation

## 2020-05-08 DIAGNOSIS — M5414 Radiculopathy, thoracic region: Secondary | ICD-10-CM | POA: Insufficient documentation

## 2020-05-08 DIAGNOSIS — Z5181 Encounter for therapeutic drug level monitoring: Secondary | ICD-10-CM | POA: Insufficient documentation

## 2020-05-08 DIAGNOSIS — M5489 Other dorsalgia: Secondary | ICD-10-CM | POA: Insufficient documentation

## 2020-05-08 DIAGNOSIS — E119 Type 2 diabetes mellitus without complications: Secondary | ICD-10-CM | POA: Diagnosis not present

## 2020-05-08 DIAGNOSIS — M5412 Radiculopathy, cervical region: Secondary | ICD-10-CM | POA: Diagnosis not present

## 2020-05-08 DIAGNOSIS — M5134 Other intervertebral disc degeneration, thoracic region: Secondary | ICD-10-CM | POA: Insufficient documentation

## 2020-05-08 DIAGNOSIS — Z79891 Long term (current) use of opiate analgesic: Secondary | ICD-10-CM | POA: Insufficient documentation

## 2020-05-08 MED ORDER — XTAMPZA ER 18 MG PO C12A: 1.0000 | EXTENDED_RELEASE_CAPSULE | Freq: Two times a day (BID) | ORAL | 0 refills | Status: DC

## 2020-05-08 MED ORDER — OXYCODONE-ACETAMINOPHEN 5-325 MG PO TABS: 1.0000 | ORAL_TABLET | Freq: Two times a day (BID) | ORAL | 0 refills | Status: DC

## 2020-05-08 NOTE — Progress Notes (Signed)
Subjective:    Patient ID: David [name], male    DOB: 01/10/1976, 44 y.o.   MRN: [id number]  HPI: David [name] is a 44 y.o. male who returns for follow up appointment for chronic pain and medication refill. He states his pain is located in his neck radiating into his left shoulder and upper extremities with tingling and burning, mid- lower back pain radiating into his bilateral lower extremities L>R. Also reports right hip and right ankle pain. He rates his pain 5. His current exercise regime is walking and performing stretching exercises.  [name] equivalent is 72. . He is also prescribed Diazepam  by Dr. Elyn Peers. We have discussed the black box warning of using opioids and benzodiazepines. I highlighted the dangers of using these drugs together and discussed the adverse events including respiratory suppression, overdose, cognitive impairment and importance of compliance with current regimen. We will continue to monitor and adjust as indicated.   Last Oral Swab was Performed on 02/07/2020, it was consistent.   Pain Inventory Average Pain 5 Pain Right Now 5 My pain is intermittent, sharp, burning, dull, stabbing, tingling and aching  In the last 24 hours, has pain interfered with the following? General activity 5 Relation with others 5 Enjoyment of life 5 What TIME of day is your pain at its worst? 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  no x-rays CT/MRI nerve study  Physicians involved in your care Any changes since last visit?  no Primary care [facility]   Family History    Problem Relation Age of Onset  . Arthritis Mother   . Heart disease Mother   . Diabetes Mother   . Heart disease Father   . Diabetes Father    Social History   Socioeconomic History  . Marital status: Married    Spouse name: Not on file  . Number of children: Not on file  . Years of education: Not on file  . Highest education level: Not on file  Occupational History  . Not on file  Tobacco Use  . Smoking status: Never Smoker  . Smokeless tobacco: Never Used  Substance and Sexual Activity  . Alcohol use: No  . Drug use: No  . Sexual activity: Not on file  Other Topics Concern  . Not on file  Social History Narrative  . Not on file   Social Determinants of Health   Financial Resource Strain:   . Difficulty of Paying Living Expenses:   Food Insecurity:   . Worried About Programme researcher, broadcasting/film/video in the Last Year:   . Barista in the Last Year:   Transportation Needs:   . Freight forwarder (Medical):   [name] Lack of Transportation (Non-Medical):   Physical Activity:   . Days of Exercise per Week:   . Minutes of Exercise per Session:   Stress:   . Feeling of Stress :   Social Connections:   . Frequency of Communication with Friends and Family:   . Frequency of Social Gatherings with Friends and Family:   . Attends Religious Services:   . Active Member of Clubs or Organizations:   .  Attends Banker Meetings:   [name] Marital Status:    No past surgical history on file. Past Medical History:  Diagnosis Date  . Chronic pain syndrome   . 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 Lumbago   . Pain in joint, lower leg   . Postlaminectomy syndrome, cervical region   . Primary localized osteoarthrosis, lower leg   . Sciatica   . Thoracic radiculopathy    There were no vitals taken for this visit.  Opioid Risk Score:   Fall Risk Score:  `1  Depression screen PHQ 2/9  Depression screen [facility] 2/9 03/06/2020 03/03/2019 01/13/2019  12/16/2018 08/16/2018 06/24/2018 01/20/2018  Decreased Interest 1 1 1  0 1 3 1   Down, Depressed, Hopeless 1 1 1  0 1 3 1   PHQ - 2 Score 2 2 2  0 2 6 2   Altered sleeping - - - - - 3 -  Tired, decreased energy - - - - - 3 -  Change in appetite - - - - - 0 -  Feeling bad or failure about yourself  - - - - - 0 -  Trouble concentrating - - - - - 0 -  Moving slowly or fidgety/restless - - - - - 0 -  Suicidal thoughts - - - - - 0 -  PHQ-9 Score - - - - - 12 -  Difficult doing work/chores - - - - - Not difficult at all -   Review of Systems  Musculoskeletal:       Spasms  Neurological: Positive for numbness. Negative for tremors.       Tingling  Psychiatric/Behavioral: Positive for dysphoric mood. The patient is nervous/anxious.   All other systems reviewed and are negative.      Objective:   Physical Exam Vitals and nursing note reviewed.  Constitutional:      Appearance: Normal appearance.  Neck:     Comments: Cervical Paraspinal Tenderness: C-5-C-6  Cardiovascular:     Rate and Rhythm: Normal rate and regular rhythm.     Pulses: Normal pulses.     Heart sounds: Normal heart sounds.  Pulmonary:     Effort: Pulmonary effort is normal.     Breath sounds: Normal breath sounds.  Musculoskeletal:     Cervical back: Normal range of motion and neck supple.     Comments: Normal Muscle Bulk and Muscle Testing Reveals:  Upper Extremities: Full ROM and Muscle Strength 5/5 Bilateral AC Joint Tenderness  Thoracic Paraspinal Tenderness: T-7-T-9 Lumbar Paraspinal Tenderness: L-3-L-5 Right Greater Trochanter Tenderness Lower Extremities: Full ROM and Muscle Strength 5/5 Arises from chair with ease Narrow Based  Gait   Skin:    General: Skin is warm and dry.  Neurological:     Mental Status: He is alert and oriented to person, place, and time.  Psychiatric:        Mood and Affect: Mood normal.        Behavior: Behavior normal.           Assessment & Plan:  1. Cervical  postlaminectomy syndrome, Hx of ACDF C4-5, C6-7, C7-T1,02/2008. Continue current medication and alternate using heat and ice therapy.05/08/2020 2. Spondylosis of L-spine . Continue current treatment regime.05/08/2020 3. Chronic postoperative pain:Refilled:Xtampza 18 mg one capsule every 12 hours for pain #60 andOxycodone 5/325mg  one tablet twice a day, may take a extra tablet when pain is sever# 75. Continue Voltaren Gel/ Flector Patch.05/08/2020 We will continue the opioid monitoring program, this consists of  regular clinic visits, examinations, urine drug screen, pill counts as well as use of [place] Controlled Substance Reporting System. 4. Diabetes with neuropathy. Continuecurrent medication regimen withGabapentin.Keep blood sugars under tight control. Continue to monitor.05/08/2020 5.Cervicalgia/Cervical Radiculopathy/ ThoracicRadiculopathy/ LumbarRadiculopathy:Continue Gabapentin.05/08/2020 6.RightGreater Trochanter Bursitis: Continue Ice/Heat and Current medication regimen.05/08/2020 7. Chronic Pain Syndrome/ Myofascial Pain: Continue Flector Patch/ Alternates with Voltaren Gel.05/08/2020 8. Sacroiliac Pain:No complaints today.Continue to Monitor.05/08/2020 9. Chronic Right Ankle Pain: Continue HEP as tolerated. Continue to Monitor.05/08/2020.  20 minutes of face to face patient care time was spent during this visit. All questions were encouraged and answered.  F/U in 1 month

## 2020-06-05 ENCOUNTER — Encounter
Payer: No Typology Code available for payment source | Attending: Physical Medicine & Rehabilitation | Admitting: Registered Nurse

## 2020-06-05 ENCOUNTER — Encounter: Payer: Self-pay | Admitting: Registered Nurse

## 2020-06-05 ENCOUNTER — Other Ambulatory Visit: Payer: Self-pay

## 2020-06-05 VITALS — BP 106/70 | HR 96 | Temp 98.3°F | Ht 75.0 in | Wt 222.0 lb

## 2020-06-05 DIAGNOSIS — M47896 Other spondylosis, lumbar region: Secondary | ICD-10-CM | POA: Diagnosis not present

## 2020-06-05 DIAGNOSIS — G894 Chronic pain syndrome: Secondary | ICD-10-CM | POA: Diagnosis present

## 2020-06-05 DIAGNOSIS — M79669 Pain in unspecified lower leg: Secondary | ICD-10-CM | POA: Diagnosis not present

## 2020-06-05 DIAGNOSIS — G8929 Other chronic pain: Secondary | ICD-10-CM

## 2020-06-05 DIAGNOSIS — M5430 Sciatica, unspecified side: Secondary | ICD-10-CM | POA: Diagnosis not present

## 2020-06-05 DIAGNOSIS — Z5181 Encounter for therapeutic drug level monitoring: Secondary | ICD-10-CM | POA: Diagnosis present

## 2020-06-05 DIAGNOSIS — M5412 Radiculopathy, cervical region: Secondary | ICD-10-CM | POA: Diagnosis not present

## 2020-06-05 DIAGNOSIS — M7061 Trochanteric bursitis, right hip: Secondary | ICD-10-CM

## 2020-06-05 DIAGNOSIS — M179 Osteoarthritis of knee, unspecified: Secondary | ICD-10-CM | POA: Insufficient documentation

## 2020-06-05 DIAGNOSIS — M5489 Other dorsalgia: Secondary | ICD-10-CM | POA: Insufficient documentation

## 2020-06-05 DIAGNOSIS — M7918 Myalgia, other site: Secondary | ICD-10-CM

## 2020-06-05 DIAGNOSIS — M5414 Radiculopathy, thoracic region: Secondary | ICD-10-CM | POA: Diagnosis not present

## 2020-06-05 DIAGNOSIS — M5416 Radiculopathy, lumbar region: Secondary | ICD-10-CM

## 2020-06-05 DIAGNOSIS — M542 Cervicalgia: Secondary | ICD-10-CM | POA: Diagnosis not present

## 2020-06-05 DIAGNOSIS — M5134 Other intervertebral disc degeneration, thoracic region: Secondary | ICD-10-CM | POA: Diagnosis not present

## 2020-06-05 DIAGNOSIS — M5135 Other intervertebral disc degeneration, thoracolumbar region: Secondary | ICD-10-CM | POA: Diagnosis not present

## 2020-06-05 DIAGNOSIS — M47817 Spondylosis without myelopathy or radiculopathy, lumbosacral region: Secondary | ICD-10-CM

## 2020-06-05 DIAGNOSIS — Z79891 Long term (current) use of opiate analgesic: Secondary | ICD-10-CM

## 2020-06-05 DIAGNOSIS — M546 Pain in thoracic spine: Secondary | ICD-10-CM | POA: Diagnosis not present

## 2020-06-05 DIAGNOSIS — E119 Type 2 diabetes mellitus without complications: Secondary | ICD-10-CM | POA: Diagnosis not present

## 2020-06-05 DIAGNOSIS — M961 Postlaminectomy syndrome, not elsewhere classified: Secondary | ICD-10-CM

## 2020-06-05 DIAGNOSIS — Z760 Encounter for issue of repeat prescription: Secondary | ICD-10-CM | POA: Diagnosis present

## 2020-06-05 MED ORDER — OXYCODONE-ACETAMINOPHEN 5-325 MG PO TABS: 1.0000 | ORAL_TABLET | Freq: Two times a day (BID) | ORAL | 0 refills | Status: DC

## 2020-06-05 MED ORDER — XTAMPZA ER 18 MG PO C12A: 1.0000 | EXTENDED_RELEASE_CAPSULE | Freq: Two times a day (BID) | ORAL | 0 refills | Status: DC

## 2020-06-05 NOTE — Progress Notes (Signed)
Subjective:    Patient ID: David [name], male    DOB: 02-15-1976, 44 y.o.   MRN: [id number]  HPI: David [name] is a 44 y.o. male who returns for follow up appointment for chronic pain and medication refill. He states his pain is located in his neck radiating into his left arm with tingling and burning,, mid- lower back pain radiating into his bilateral lower extremities L>R. Also reports right hip pain and bilateral lower extremities with tingling and burning. He rates his pain 5. His current exercise regime is walking and performing stretching exercises.  Mr. Vandermeulen Morphine equivalent is 72.75 MME.  He is also prescribed Diazepam by Dr. Elyn Peers. We have discussed the black box warning of using opioids and benzodiazepines. I highlighted the dangers of using these drugs together and discussed the adverse events including respiratory suppression, overdose, cognitive impairment and importance of compliance with current regimen. We will continue to monitor and adjust as indicated.    Last Oral Swab was performed on 02/06/2014, it was consistent.    Pain Inventory Average Pain 5 Pain Right Now 5 My pain is sharp, burning, dull, stabbing, tingling and aching  In the last 24 hours, has pain interfered with the following? General activity 6 Relation with others 6 Enjoyment of life 6 What TIME of day is your pain at its worst? all Sleep (in general) Fair  Pain is worse with: walking, bending, sitting, inactivity, standing and some activites Pain improves with: rest, heat/ice, therapy/exercise, pacing activities and medication Relief from Meds: 4  Mobility walk without assistance  Function employed # of hrs/week 40+  Neuro/Psych trouble walking  Prior Studies Any changes since last visit?  no  Physicians involved in your care Any changes since last visit?  no   Family History  Problem Relation Age of Onset  . Arthritis Mother   . Heart disease Mother   . Diabetes Mother   .  Heart disease Father   . Diabetes Father    Social History   Socioeconomic History  . Marital status: Married    Spouse name: Not on file  . Number of children: Not on file  . Years of education: Not on file  . Highest education level: Not on file  Occupational History  . Not on file  Tobacco Use  . Smoking status: Never Smoker  . Smokeless tobacco: Never Used  Substance and Sexual Activity  . Alcohol use: No  . Drug use: No  . Sexual activity: Not on file  Other Topics Concern  . Not on file  Social History Narrative  . Not on file   Social Determinants of Health   Financial Resource Strain:   . Difficulty of Paying Living Expenses:   Food Insecurity:   . Worried About Programme researcher, broadcasting/film/video in the Last Year:   . Barista in the Last Year:   Transportation Needs:   . Freight forwarder (Medical):   [name] Lack of Transportation (Non-Medical):   Physical Activity:   . Days of Exercise per Week:   . Minutes of Exercise per Session:   Stress:   . Feeling of Stress :   Social Connections:   . Frequency of Communication with Friends and Family:   . Frequency of Social Gatherings with Friends and Family:   . Attends Religious Services:   . Active Member of Clubs or Organizations:   . Attends Banker Meetings:   [name] Marital Status:  History reviewed. No pertinent surgical history. Past Medical History:  Diagnosis Date  . Chronic pain syndrome   . 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 Lumbago   . Pain in joint, lower leg   . Postlaminectomy syndrome, cervical region   . Primary localized osteoarthrosis, lower leg   . Sciatica   . Thoracic radiculopathy    There were no vitals taken for this visit.  Opioid Risk Score:   Fall Risk Score:  `1  Depression screen PHQ 2/9  Depression screen [facility] 2/9 03/06/2020 03/03/2019 01/13/2019 12/16/2018 08/16/2018 06/24/2018 01/20/2018  Decreased Interest 1 1 1  0 1 3 1   Down, Depressed,  Hopeless 1 1 1  0 1 3 1   PHQ - 2 Score 2 2 2  0 2 6 2   Altered sleeping - - - - - 3 -  Tired, decreased energy - - - - - 3 -  Change in appetite - - - - - 0 -  Feeling bad or failure about yourself  - - - - - 0 -  Trouble concentrating - - - - - 0 -  Moving slowly or fidgety/restless - - - - - 0 -  Suicidal thoughts - - - - - 0 -  PHQ-9 Score - - - - - 12 -  Difficult doing work/chores - - - - - Not difficult at all -  ] Review of Systems  Constitutional: Negative.   HENT: Negative.   Eyes: Negative.   Respiratory: Negative.   Cardiovascular: Negative.   Gastrointestinal: Negative.   Endocrine: Negative.   Genitourinary: Negative.   Musculoskeletal: Positive for arthralgias, back pain, gait problem and neck pain.  Skin: Negative.   Allergic/Immunologic: Negative.   Hematological: Negative.   Psychiatric/Behavioral: Negative.   All other systems reviewed and are negative.      Objective:   Physical Exam Vitals and nursing note reviewed.  Constitutional:      Appearance: Normal appearance.  Neck:     Comments: Cervical Paraspinal Tenderness: C-5-C-6 Cardiovascular:     Rate and Rhythm: Normal rate and regular rhythm.     Pulses: Normal pulses.     Heart sounds: Normal heart sounds.  Pulmonary:     Effort: Pulmonary effort is normal.     Breath sounds: Normal breath sounds.  Musculoskeletal:     Cervical back: Normal range of motion and neck supple.     Comments: Normal Muscle Bulk and Muscle Testing Reveals:  Upper Extremities: Full ROM and Muscle Strength 5/5 Bilateral AC Joint Tenderness  Thoracic Paraspinal Tenderness: T-7-T-9 Lumbar Paraspinal Tenderness: L-3-L-5 Lower Extremities: Full ROM and Muscle Strength 5/5 Arises from Table with ease Narrow Based  Gait   Skin:    General: Skin is warm and dry.  Neurological:     Mental Status: He is alert and oriented to person, place, and time.  Psychiatric:        Mood and Affect: Mood normal.        Behavior:  Behavior normal.           Assessment & Plan:  1. Cervical postlaminectomy syndrome, Hx of ACDF C4-5, C6-7, C7-T1,02/2008. Continue current medication and alternate using heat and ice therapy.06/05/2020 2. Spondylosis of L-spine . Continue current treatment regime.06/05/2020 3. Chronic Pain SyndromeRefilled:Xtampza 18 mg one capsule every 12 hours for pain #60 andOxycodone 5/325mg  one tablet twice a day, may take a extra tablet when pain is sever# 75. Continue Voltaren Gel/ Flector Patch.06/05/2020 We will continue  the opioid monitoring program, this consists of regular clinic visits, examinations, urine drug screen, pill counts as well as use of [place] Controlled Substance Reporting System. 4. Diabetes with neuropathy. Continuecurrent medication regimen withGabapentin.Keep blood sugars under tight control. Continue to monitor.06/05/2020 5.Cervicalgia/Cervical Radiculopathy/ ThoracicRadiculopathy/ LumbarRadiculopathy:Continue Gabapentin.06/05/2020 6.RightGreater Trochanter Bursitis: Continue Ice/Heat and Current medication regimen.06/05/2020 7. Chronic Pain Syndrome/ Myofascial Pain: Continue Flector Patch/ Alternates with Voltaren Gel.06/05/2020 8. Sacroiliac Pain:No complaints today.Continue to Monitor.06/05/2020 9. Chronic Right Ankle Pain: No complaints today.Continue HEP as tolerated. Continue to Monitor.06/05/2020.  20 minutes of face to face patient care time was spent during this visit. All questions were encouraged and answered.  F/U in 1 month

## 2020-07-11 ENCOUNTER — Encounter
Payer: No Typology Code available for payment source | Attending: Physical Medicine & Rehabilitation | Admitting: Registered Nurse

## 2020-07-11 ENCOUNTER — Encounter: Payer: Self-pay | Admitting: Registered Nurse

## 2020-07-11 ENCOUNTER — Other Ambulatory Visit: Payer: Self-pay

## 2020-07-11 VITALS — BP 130/77 | HR 102 | Temp 98.5°F | Ht 75.0 in | Wt 222.2 lb

## 2020-07-11 DIAGNOSIS — M5489 Other dorsalgia: Secondary | ICD-10-CM | POA: Insufficient documentation

## 2020-07-11 DIAGNOSIS — M179 Osteoarthritis of knee, unspecified: Secondary | ICD-10-CM | POA: Diagnosis not present

## 2020-07-11 DIAGNOSIS — M5134 Other intervertebral disc degeneration, thoracic region: Secondary | ICD-10-CM | POA: Insufficient documentation

## 2020-07-11 DIAGNOSIS — M5430 Sciatica, unspecified side: Secondary | ICD-10-CM | POA: Diagnosis not present

## 2020-07-11 DIAGNOSIS — M7061 Trochanteric bursitis, right hip: Secondary | ICD-10-CM

## 2020-07-11 DIAGNOSIS — E119 Type 2 diabetes mellitus without complications: Secondary | ICD-10-CM | POA: Insufficient documentation

## 2020-07-11 DIAGNOSIS — M7918 Myalgia, other site: Secondary | ICD-10-CM

## 2020-07-11 DIAGNOSIS — G894 Chronic pain syndrome: Secondary | ICD-10-CM | POA: Diagnosis not present

## 2020-07-11 DIAGNOSIS — G8929 Other chronic pain: Secondary | ICD-10-CM | POA: Diagnosis not present

## 2020-07-11 DIAGNOSIS — M5135 Other intervertebral disc degeneration, thoracolumbar region: Secondary | ICD-10-CM | POA: Diagnosis not present

## 2020-07-11 DIAGNOSIS — Z79891 Long term (current) use of opiate analgesic: Secondary | ICD-10-CM | POA: Diagnosis present

## 2020-07-11 DIAGNOSIS — M5412 Radiculopathy, cervical region: Secondary | ICD-10-CM | POA: Diagnosis not present

## 2020-07-11 DIAGNOSIS — M542 Cervicalgia: Secondary | ICD-10-CM | POA: Diagnosis not present

## 2020-07-11 DIAGNOSIS — Z760 Encounter for issue of repeat prescription: Secondary | ICD-10-CM | POA: Diagnosis present

## 2020-07-11 DIAGNOSIS — Z5181 Encounter for therapeutic drug level monitoring: Secondary | ICD-10-CM | POA: Diagnosis present

## 2020-07-11 DIAGNOSIS — M961 Postlaminectomy syndrome, not elsewhere classified: Secondary | ICD-10-CM | POA: Diagnosis not present

## 2020-07-11 DIAGNOSIS — M47896 Other spondylosis, lumbar region: Secondary | ICD-10-CM | POA: Diagnosis not present

## 2020-07-11 DIAGNOSIS — M5414 Radiculopathy, thoracic region: Secondary | ICD-10-CM | POA: Insufficient documentation

## 2020-07-11 DIAGNOSIS — M5416 Radiculopathy, lumbar region: Secondary | ICD-10-CM

## 2020-07-11 DIAGNOSIS — M79669 Pain in unspecified lower leg: Secondary | ICD-10-CM | POA: Insufficient documentation

## 2020-07-11 DIAGNOSIS — M546 Pain in thoracic spine: Secondary | ICD-10-CM | POA: Diagnosis not present

## 2020-07-11 DIAGNOSIS — M47817 Spondylosis without myelopathy or radiculopathy, lumbosacral region: Secondary | ICD-10-CM

## 2020-07-11 MED ORDER — XTAMPZA ER 18 MG PO C12A: 1.0000 | EXTENDED_RELEASE_CAPSULE | Freq: Two times a day (BID) | ORAL | 0 refills | Status: DC

## 2020-07-11 MED ORDER — OXYCODONE-ACETAMINOPHEN 5-325 MG PO TABS: 1.0000 | ORAL_TABLET | Freq: Two times a day (BID) | ORAL | 0 refills | Status: DC

## 2020-07-11 NOTE — Progress Notes (Signed)
Subjective:    Patient ID: David [name], male    DOB: 06-28-1976, 44 y.o.   MRN: [id number]  HPI: David [name] is a 44 y.o. male who returns for follow up appointment for chronic pain and medication refill. He states his pain is located in his neck on right side radiating into let left arm, left ring finger and left pinky finger with tingling and burning, upper- lower back pain radiating into his bilateral lower extremities L>R, right hip pain and right ankle pain. Also reports he awaken with a headache and has abdominal discomfort denies N/V and he's moving his bowels, he states he will F/U with his PCP, he was instructed if he develops any pain to go to urgent care, he verbalizes understanding.  He rates his pain 5. His  current exercise regime is walking and performing stretching exercises.  David [name] Morphine equivalent is 72.75 MME.  He  is also prescribed Diazepam by Mauricio Po NP. We have discussed the black box warning of using opioids and benzodiazepines. I highlighted the dangers of using these drugs together and discussed the adverse events including respiratory suppression, overdose, cognitive impairment and importance of compliance with current regimen. We will continue to monitor and adjust as indicated.   . Last Oral Swab was Performed on 02/07/2020, it was consistent.    Pain Inventory Average Pain 5 Pain Right Now 5 My pain is intermittent, constant, sharp, burning, dull, stabbing, tingling and aching  In the last 24 hours, has pain interfered with the following? General activity 5 Relation with others 5 Enjoyment of life 5 What TIME of day is your pain at its worst? varies Sleep (in general) Fair  Pain is worse with: walking, bending, sitting, inactivity, standing and some activites Pain improves with: rest, heat/ice, therapy/exercise, pacing activities, medication and injections Relief from Meds: 5  Family History  Problem Relation Age of Onset  . Arthritis  Mother   . Heart disease Mother   . Diabetes Mother   . Heart disease Father   . Diabetes Father    Social History   Socioeconomic History  . Marital status: Married    Spouse name: Not on file  . Number of children: Not on file  . Years of education: Not on file  . Highest education level: Not on file  Occupational History  . Not on file  Tobacco Use  . Smoking status: Never Smoker  . Smokeless tobacco: Never Used  Vaping Use  . Vaping Use: Former  Substance and Sexual Activity  . Alcohol use: No  . Drug use: No  . Sexual activity: Not on file  Other Topics Concern  . Not on file  Social History Narrative  . Not on file   Social Determinants of Health   Financial Resource Strain:   . Difficulty of Paying Living Expenses: Not on file  Food Insecurity:   . Worried About Programme researcher, broadcasting/film/video in the Last Year: Not on file  . Ran Out of Food in the Last Year: Not on file  Transportation Needs:   . Lack of Transportation (Medical): Not on file  . Lack of Transportation (Non-Medical): Not on file  Physical Activity:   . Days of Exercise per Week: Not on file  . Minutes of Exercise per Session: Not on file  Stress:   . Feeling of Stress : Not on file  Social Connections:   . Frequency of Communication with Friends and Family: Not on file  .  Frequency of Social Gatherings with Friends and Family: Not on file  . Attends Religious Services: Not on file  . Active Member of Clubs or Organizations: Not on file  . Attends Banker Meetings: Not on file  . Marital Status: Not on file   History reviewed. No pertinent surgical history. History reviewed. No pertinent surgical history. Past Medical History:  Diagnosis Date  . Chronic pain syndrome   . 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 Lumbago   . Pain in joint, lower leg   . Postlaminectomy syndrome, cervical region   . Primary localized osteoarthrosis, lower leg   . Sciatica   .  Thoracic radiculopathy    BP 130/77   Pulse (!) 102   Temp 98.5 F (36.9 C)   Ht 6\' 3"  (1.905 m)   Wt 222 lb 3.2 oz (100.8 kg)   SpO2 97%   BMI 27.77 kg/m   Opioid Risk Score:   Fall Risk Score:  `1  Depression screen PHQ 2/9  Depression screen [facility] 2/9 03/06/2020 03/03/2019 01/13/2019 12/16/2018 08/16/2018 06/24/2018 01/20/2018  Decreased Interest 1 1 1  0 1 3 1   Down, Depressed, Hopeless 1 1 1  0 1 3 1   PHQ - 2 Score 2 2 2  0 2 6 2   Altered sleeping - - - - - 3 -  Tired, decreased energy - - - - - 3 -  Change in appetite - - - - - 0 -  Feeling bad or failure about yourself  - - - - - 0 -  Trouble concentrating - - - - - 0 -  Moving slowly or fidgety/restless - - - - - 0 -  Suicidal thoughts - - - - - 0 -  PHQ-9 Score - - - - - 12 -  Difficult doing work/chores - - - - - Not difficult at all -   Review of Systems  Musculoskeletal: Positive for arthralgias, back pain and gait problem.  All other systems reviewed and are negative.      Objective:   Physical Exam Vitals and nursing note reviewed.  Constitutional:      Appearance: Normal appearance.  Neck:     Comments: Cervical Paraspinal Tenderness: C-5-C-6 Cardiovascular:     Rate and Rhythm: Normal rate and regular rhythm.     Pulses: Normal pulses.     Heart sounds: Normal heart sounds.  Pulmonary:     Effort: Pulmonary effort is normal.     Breath sounds: Normal breath sounds.  Musculoskeletal:     Cervical back: Normal range of motion and neck supple.     Comments: Normal Muscle Bulk and Muscle Testing Reveals:  Upper Extremities: Full ROM and Muscle Strength 5/5 Left AC Joint Tenderness  Thoracic Paraspinal Tenderness: T-7-T-9 Lumbar Paraspinal Tenderness: L-3-L-5 Right Greater Trochanter Tenderness Lower Extremities: Full ROM and Muscle Strength 5/5 Arises from Table with ease Narrow Based  Gait   Skin:    General: Skin is warm and dry.  Neurological:     Mental Status: He is alert and oriented to person,  place, and time.  Psychiatric:        Mood and Affect: Mood normal.        Behavior: Behavior normal.           Assessment & Plan:  1. Cervical postlaminectomy syndrome, Hx of ACDF C4-5, C6-7, C7-T1,02/2008. Continue current medication and alternate using heat and ice therapy.07/11/2020 2. Spondylosis of L-spine . Continue current  treatment regime.06/05/2020 3. Chronic Pain SyndromeRefilled:Xtampza 18 mg one capsule every 12 hours for pain #60 andOxycodone 5/325mg  one tablet twice a day, may take a extra tablet when pain is sever# 75. Continue Voltaren Gel/ Flector Patch.07/11/2020 We will continue the opioid monitoring program, this consists of regular clinic visits, examinations, urine drug screen, pill counts as well as use of [place] Controlled Substance Reporting system. A 12 month History has been reviewed on the [place] Controlled Substance Reporting System on 07/11/2020. 4. Diabetes with neuropathy. Continuecurrent medication regimen withGabapentin.Keep blood sugars under tight control. Continue to monitor.07/11/2020 5.Cervicalgia/Cervical Radiculopathy/ ThoracicRadiculopathy/ LumbarRadiculopathy:Continue Gabapentin.07/11/2020 6.RightGreater Trochanter Bursitis: Continue Ice/Heat and Current medication regimen.07/11/2020 7. Chronic Pain Syndrome/ Myofascial Pain: Continue Flector Patch/ Alternates with Voltaren Gel.07/11/2020 8. Sacroiliac Pain:No complaints today.Continue to Monitor.07/11/2020 9. Chronic Right Ankle Pain: No complaints today.Continue HEP as tolerated. Continue to Monitor.07/11/2020. 10. Abdominal Discomfort: Instructed to go to Urgent Care or F/U with his PCP he verbalizes understanding. He denies any abdominal pain at this time and denies N/V and states he has a good appetite and moving his bowels.   of face to face patient care time was spent during this visit. All questions were encouraged and answered.  F/U in  1 month

## 2020-07-29 ENCOUNTER — Other Ambulatory Visit: Payer: Self-pay | Admitting: Family

## 2020-07-29 DIAGNOSIS — N5089 Other specified disorders of the male genital organs: Secondary | ICD-10-CM

## 2020-08-07 ENCOUNTER — Encounter
Payer: No Typology Code available for payment source | Attending: Physical Medicine & Rehabilitation | Admitting: Registered Nurse

## 2020-08-07 ENCOUNTER — Other Ambulatory Visit: Payer: Self-pay

## 2020-08-07 ENCOUNTER — Ambulatory Visit
Admission: RE | Admit: 2020-08-07 | Discharge: 2020-08-07 | Disposition: A | Discharge status: Home / Self Care | Payer: No Typology Code available for payment source | Source: Ambulatory Visit | Attending: Family | Admitting: Family

## 2020-08-07 ENCOUNTER — Encounter: Payer: Self-pay | Admitting: Registered Nurse

## 2020-08-07 VITALS — BP 114/76 | HR 90 | Temp 98.5°F | Ht 75.0 in | Wt 222.4 lb

## 2020-08-07 DIAGNOSIS — Z79891 Long term (current) use of opiate analgesic: Secondary | ICD-10-CM | POA: Insufficient documentation

## 2020-08-07 DIAGNOSIS — M7061 Trochanteric bursitis, right hip: Secondary | ICD-10-CM | POA: Diagnosis present

## 2020-08-07 DIAGNOSIS — M961 Postlaminectomy syndrome, not elsewhere classified: Secondary | ICD-10-CM | POA: Insufficient documentation

## 2020-08-07 DIAGNOSIS — G8929 Other chronic pain: Secondary | ICD-10-CM | POA: Diagnosis present

## 2020-08-07 DIAGNOSIS — M5412 Radiculopathy, cervical region: Secondary | ICD-10-CM | POA: Diagnosis not present

## 2020-08-07 DIAGNOSIS — M5416 Radiculopathy, lumbar region: Secondary | ICD-10-CM | POA: Diagnosis present

## 2020-08-07 DIAGNOSIS — M546 Pain in thoracic spine: Secondary | ICD-10-CM | POA: Insufficient documentation

## 2020-08-07 DIAGNOSIS — M47817 Spondylosis without myelopathy or radiculopathy, lumbosacral region: Secondary | ICD-10-CM | POA: Insufficient documentation

## 2020-08-07 DIAGNOSIS — M7918 Myalgia, other site: Secondary | ICD-10-CM | POA: Diagnosis present

## 2020-08-07 DIAGNOSIS — Z5181 Encounter for therapeutic drug level monitoring: Secondary | ICD-10-CM | POA: Insufficient documentation

## 2020-08-07 DIAGNOSIS — G894 Chronic pain syndrome: Secondary | ICD-10-CM | POA: Insufficient documentation

## 2020-08-07 DIAGNOSIS — M542 Cervicalgia: Secondary | ICD-10-CM | POA: Diagnosis not present

## 2020-08-07 DIAGNOSIS — N5089 Other specified disorders of the male genital organs: Secondary | ICD-10-CM

## 2020-08-07 MED ORDER — XTAMPZA ER 18 MG PO C12A: 1.0000 | EXTENDED_RELEASE_CAPSULE | Freq: Two times a day (BID) | ORAL | 0 refills | Status: DC

## 2020-08-07 MED ORDER — OXYCODONE-ACETAMINOPHEN 5-325 MG PO TABS: 1.0000 | ORAL_TABLET | Freq: Two times a day (BID) | ORAL | 0 refills | Status: DC

## 2020-08-07 NOTE — Progress Notes (Signed)
Subjective:    Patient ID: David [name], male    DOB: 1976-09-23, 44 y.o.   MRN: [id number]  HPI: David [name] is a 44 y.o. male who returns for follow up appointment for chronic pain and medication refill. He states his pain is located in his neck radiating into his bilateral shoulders, upper- lower back pain radiating into his bilateral lower extremities. Also reports right hip pain. He rates his pain 6. His current exercise regime is walking and performing stretching exercises.  Mr. Hellinger Morphine equivalent is 72.75 MME.  He is also prescribed Diazepam by Mauricio Po Np. We have discussed the black box warning of using opioids and benzodiazepines. I highlighted the dangers of using these drugs together and discussed the adverse events including respiratory suppression, overdose, cognitive impairment and importance of compliance with current regimen. We will continue to monitor and adjust as indicated.  Last Oral Swab was Performed on 02/07/2020, it was consistent.     Pain Inventory Average Pain 5 Pain Right Now 6 My pain is intermittent, constant, sharp, burning, dull, stabbing, tingling and aching  In the last 24 hours, has pain interfered with the following? General activity 5 Relation with others 5 Enjoyment of life 6 What TIME of day is your pain at its worst? morning , daytime, evening and night Sleep (in general) Fair  Pain is worse with: walking, bending, sitting, inactivity, standing and some activites Pain improves with: rest, heat/ice, therapy/exercise, pacing activities, medication and TENS Relief from Meds: 5  Family History  Problem Relation Age of Onset  . Arthritis Mother   . Heart disease Mother   . Diabetes Mother   . Heart disease Father   . Diabetes Father    Social History   Socioeconomic History  . Marital status: Single    Spouse name: Not on file  . Number of children: Not on file  . Years of education: Not on file  . Highest education level:  Not on file  Occupational History  . Not on file  Tobacco Use  . Smoking status: Never Smoker  . Smokeless tobacco: Never Used  Vaping Use  . Vaping Use: Former  Substance and Sexual Activity  . Alcohol use: No  . Drug use: No  . Sexual activity: Not on file  Other Topics Concern  . Not on file  Social History Narrative  . Not on file   Social Determinants of Health   Financial Resource Strain:   . Difficulty of Paying Living Expenses: Not on file  Food Insecurity:   . Worried About Programme researcher, broadcasting/film/video in the Last Year: Not on file  . Ran Out of Food in the Last Year: Not on file  Transportation Needs:   . Lack of Transportation (Medical): Not on file  . Lack of Transportation (Non-Medical): Not on file  Physical Activity:   . Days of Exercise per Week: Not on file  . Minutes of Exercise per Session: Not on file  Stress:   . Feeling of Stress : Not on file  Social Connections:   . Frequency of Communication with Friends and Family: Not on file  . Frequency of Social Gatherings with Friends and Family: Not on file  . Attends Religious Services: Not on file  . Active Member of Clubs or Organizations: Not on file  . Attends Banker Meetings: Not on file  . Marital Status: Not on file   No past surgical history on file. No  past surgical history on file. Past Medical History:  Diagnosis Date  . Chronic pain syndrome   . 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 Lumbago   . Pain in joint, lower leg   . Postlaminectomy syndrome, cervical region   . Primary localized osteoarthrosis, lower leg   . Sciatica   . Thoracic radiculopathy    BP 114/76   Pulse 90   Temp 98.5 F (36.9 C)   Ht 6\' 3"  (1.905 m)   Wt 222 lb 6.4 oz (100.9 kg)   SpO2 98%   BMI 27.80 kg/m   Opioid Risk Score:   Fall Risk Score:  `1  Depression screen PHQ 2/9  Depression screen [facility] 2/9 07/11/2020 03/06/2020 03/03/2019 01/13/2019 12/16/2018 08/16/2018 06/24/2018    Decreased Interest 1 1 1 1  0 1 3  Down, Depressed, Hopeless 0 1 1 1  0 1 3  PHQ - 2 Score 1 2 2 2  0 2 6  Altered sleeping 2 - - - - - 3  Tired, decreased energy 1 - - - - - 3  Change in appetite 0 - - - - - 0  Feeling bad or failure about yourself  0 - - - - - 0  Trouble concentrating 0 - - - - - 0  Moving slowly or fidgety/restless 0 - - - - - 0  Suicidal thoughts 0 - - - - - 0  PHQ-9 Score 4 - - - - - 12  Difficult doing work/chores - - - - - - Not difficult at all    Review of Systems  Musculoskeletal: Positive for back pain and neck pain.       Leg pain Hip pain   All other systems reviewed and are negative.      Objective:   Physical Exam Vitals and nursing note reviewed.  Constitutional:      Appearance: Normal appearance.  Cardiovascular:     Rate and Rhythm: Normal rate and regular rhythm.     Pulses: Normal pulses.     Heart sounds: Normal heart sounds.  Pulmonary:     Effort: Pulmonary effort is normal.     Breath sounds: Normal breath sounds.  Musculoskeletal:     Cervical back: Normal range of motion and neck supple.     Comments: Normal Muscle Bulk and Muscle Testing Reveals:  Upper Extremities: Full ROM and Muscle Strength 5/5 Left AC Joint Tenderness  Thoracic and  Lumbar Hypersensitivity Right Greater Trochanter Tenderness Lower Extremities: Full ROM and Muscle Strength 5/5 Arises from chair with ease Narrow Based Gait   Skin:    General: Skin is warm and dry.  Neurological:     Mental Status: He is alert and oriented to person, place, and time.  Psychiatric:        Mood and Affect: Mood normal.        Behavior: Behavior normal.           Assessment & Plan:  1. Cervical postlaminectomy syndrome, Hx of ACDF C4-5, C6-7, C7-T1,02/2008. Continue current medication and alternate using heat and ice therapy.08/07/2020 2. Spondylosis of L-spine . Continue current treatment regime.08/07/2020 3. ChronicPain SyndromeRefilled:Xtampza 18 mg one  capsule every 12 hours for pain #60 andOxycodone 5/325mg  one tablet twice a day, may take a extra tablet when pain is sever# 75. Continue Voltaren Gel/ Flector Patch.08/07/2020 We will continue the opioid monitoring program, this consists of regular clinic visits, examinations, urine drug screen, pill counts as well as  use of [place] Controlled Substance Reporting system. A 12 month History has been reviewed on the [place] Controlled Substance Reporting System on 08/07/2020. 4. Diabetes with neuropathy. Continuecurrent medication regimen withGabapentin.Keep blood sugars under tight control. Continue to monitor.08/07/2020 5.Cervicalgia/Cervical Radiculopathy/ ThoracicRadiculopathy/ LumbarRadiculopathy:Continue Gabapentin.08/07/2020 6.RightGreater Trochanter Bursitis: Continue Ice/Heat and Current medication regimen.08/07/2020 7. Chronic Pain Syndrome/ Myofascial Pain: Continue Flector Patch/ Alternates with Voltaren Gel.08/07/2020 8. Sacroiliac Pain:Continue HEP as Tolerated. Continue to Monitor.08/07/2020 9. Chronic Right Ankle Pain:No complaints today.Continue HEP as tolerated. Continue to Monitor.08/07/2020.  of face to face patient care time was spent during this visit. All questions were encouraged and answered.  F/U in 1 month

## 2020-08-09 ENCOUNTER — Other Ambulatory Visit: Payer: Self-pay | Admitting: Physical Medicine & Rehabilitation

## 2020-09-04 ENCOUNTER — Encounter: Payer: Self-pay | Admitting: Registered Nurse

## 2020-09-04 ENCOUNTER — Encounter
Payer: No Typology Code available for payment source | Attending: Physical Medicine & Rehabilitation | Admitting: Registered Nurse

## 2020-09-04 ENCOUNTER — Other Ambulatory Visit: Payer: Self-pay

## 2020-09-04 VITALS — BP 104/69 | HR 84 | Temp 98.8°F | Ht 75.0 in | Wt 223.0 lb

## 2020-09-04 DIAGNOSIS — M7918 Myalgia, other site: Secondary | ICD-10-CM

## 2020-09-04 DIAGNOSIS — G894 Chronic pain syndrome: Secondary | ICD-10-CM | POA: Diagnosis not present

## 2020-09-04 DIAGNOSIS — M5412 Radiculopathy, cervical region: Secondary | ICD-10-CM

## 2020-09-04 DIAGNOSIS — Z79891 Long term (current) use of opiate analgesic: Secondary | ICD-10-CM

## 2020-09-04 DIAGNOSIS — M961 Postlaminectomy syndrome, not elsewhere classified: Secondary | ICD-10-CM

## 2020-09-04 DIAGNOSIS — Z5181 Encounter for therapeutic drug level monitoring: Secondary | ICD-10-CM | POA: Diagnosis not present

## 2020-09-04 DIAGNOSIS — M7061 Trochanteric bursitis, right hip: Secondary | ICD-10-CM

## 2020-09-04 DIAGNOSIS — M546 Pain in thoracic spine: Secondary | ICD-10-CM | POA: Diagnosis present

## 2020-09-04 DIAGNOSIS — M542 Cervicalgia: Secondary | ICD-10-CM

## 2020-09-04 DIAGNOSIS — M5416 Radiculopathy, lumbar region: Secondary | ICD-10-CM | POA: Diagnosis present

## 2020-09-04 DIAGNOSIS — G8929 Other chronic pain: Secondary | ICD-10-CM | POA: Diagnosis present

## 2020-09-04 DIAGNOSIS — M47817 Spondylosis without myelopathy or radiculopathy, lumbosacral region: Secondary | ICD-10-CM | POA: Diagnosis present

## 2020-09-04 MED ORDER — XTAMPZA ER 18 MG PO C12A: 1.0000 | EXTENDED_RELEASE_CAPSULE | Freq: Two times a day (BID) | ORAL | 0 refills | Status: DC

## 2020-09-04 MED ORDER — OXYCODONE-ACETAMINOPHEN 5-325 MG PO TABS: 1.0000 | ORAL_TABLET | Freq: Two times a day (BID) | ORAL | 0 refills | Status: DC

## 2020-09-04 NOTE — Progress Notes (Signed)
Subjective:    Patient ID: David [name], male    DOB: December 07, 1975, 44 y.o.   MRN: [id number]  HPI: David [name] is a 44 y.o. male who returns for follow up appointment for chronic pain and medication refill. He states his pain is located in his neck radiating into his left arm with tingling and burning,upper- lower back pain radiating into his bilateral lower extremities L>R. Also reports right hip pain. David [name] reports increase intensity of right lower back pain and right hip, refuses X-ray at this time, he will call office or send a My chart message when he's ready to have the X-ray he states. He rates his pain 5. His current exercise regime is walking and performing stretching exercises.  David [name] equivalent is 54.00  MME. He is also prescribed Diazepam by Mauricio Po Np. We have discussed the black box warning of using opioids and benzodiazepines. I highlighted the dangers of using these drugs together and discussed the adverse events including respiratory suppression, overdose, cognitive impairment and importance of compliance with current regimen. We will continue to monitor and adjust as indicated.    Oral Swab was Performed today.    Pain Inventory Average Pain 5 Pain Right Now 5 My pain is intermittent, constant, sharp, burning, dull, stabbing, tingling and aching  In the last 24 hours, has pain interfered with the following? General activity 5 Relation with others 5 Enjoyment of life 6 What TIME of day is your pain at its worst? morning , daytime, evening and night Sleep (in general) Fair  Pain is worse with: walking, bending, sitting, inactivity, standing and some activites Pain improves with: rest, heat/ice, therapy/exercise, pacing activities, medication, TENS and injections Relief from Meds: 4  Family History  Problem Relation Age of Onset  . Arthritis Mother   . Heart disease Mother   . Diabetes Mother   . Heart disease Father   . Diabetes Father     Social History   Socioeconomic History  . Marital status: Single    Spouse name: Not on file  . Number of children: Not on file  . Years of education: Not on file  . Highest education level: Not on file  Occupational History  . Not on file  Tobacco Use  . Smoking status: Never Smoker  . Smokeless tobacco: Never Used  Vaping Use  . Vaping Use: Former  Substance and Sexual Activity  . Alcohol use: No  . Drug use: No  . Sexual activity: Not on file  Other Topics Concern  . Not on file  Social History Narrative  . Not on file   Social Determinants of Health   Financial Resource Strain:   . Difficulty of Paying Living Expenses: Not on file  Food Insecurity:   . Worried About Programme researcher, broadcasting/film/video in the Last Year: Not on file  . Ran Out of Food in the Last Year: Not on file  Transportation Needs:   . Lack of Transportation (Medical): Not on file  . Lack of Transportation (Non-Medical): Not on file  Physical Activity:   . Days of Exercise per Week: Not on file  . Minutes of Exercise per Session: Not on file  Stress:   . Feeling of Stress : Not on file  Social Connections:   . Frequency of Communication with Friends and Family: Not on file  . Frequency of Social Gatherings with Friends and Family: Not on file  . Attends Religious Services: Not on  file  . Active Member of Clubs or Organizations: Not on file  . Attends Banker Meetings: Not on file  . Marital Status: Not on file   History reviewed. No pertinent surgical history. History reviewed. No pertinent surgical history. Past Medical History:  Diagnosis Date  . Chronic pain syndrome   . 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 Lumbago   . Pain in joint, lower leg   . Postlaminectomy syndrome, cervical region   . Primary localized osteoarthrosis, lower leg   . Sciatica   . Thoracic radiculopathy    BP 104/69   Pulse 84   Temp 98.8 F (37.1 C)   Ht 6\' 3"  (1.905 m)    Wt 223 lb (101.2 kg)   SpO2 99%   BMI 27.87 kg/m   Opioid Risk Score:   Fall Risk Score:  `1  Depression screen PHQ 2/9  Depression screen [facility] 2/9 07/11/2020 03/06/2020 03/03/2019 01/13/2019 12/16/2018 08/16/2018 06/24/2018  Decreased Interest 1 1 1 1  0 1 3  Down, Depressed, Hopeless 0 1 1 1  0 1 3  PHQ - 2 Score 1 2 2 2  0 2 6  Altered sleeping 2 - - - - - 3  Tired, decreased energy 1 - - - - - 3  Change in appetite 0 - - - - - 0  Feeling bad or failure about yourself  0 - - - - - 0  Trouble concentrating 0 - - - - - 0  Moving slowly or fidgety/restless 0 - - - - - 0  Suicidal thoughts 0 - - - - - 0  PHQ-9 Score 4 - - - - - 12  Difficult doing work/chores - - - - - - Not difficult at all    Review of Systems  Constitutional: Negative.   HENT: Negative.   Eyes: Negative.   Respiratory: Negative.   Cardiovascular: Negative.   Gastrointestinal: Negative.   Endocrine: Negative.   Musculoskeletal: Positive for back pain.  Skin: Negative.   Allergic/Immunologic: Negative.   Neurological: Negative.   Hematological: Negative.   Psychiatric/Behavioral: Negative.   All other systems reviewed and are negative.      Objective:   Physical Exam Vitals and nursing note reviewed.  Constitutional:      Appearance: Normal appearance.  Cardiovascular:     Rate and Rhythm: Normal rate and regular rhythm.     Pulses: Normal pulses.     Heart sounds: Normal heart sounds.  Pulmonary:     Effort: Pulmonary effort is normal.     Breath sounds: Normal breath sounds.  Musculoskeletal:     Cervical back: Normal range of motion and neck supple.     Comments: Normal Muscle Bulk and Muscle Testing Reveals:  Upper Extremities: Full ROM and Muscle Strength 5/5 Thoracic and  Lumbar Hypersensitivity Right Greater Trochanter Tenderness Lower Extremities: Full ROM and Muscle Strength 5/5 Arises from chair with ease Narrow Based  Gait   Skin:    General: Skin is warm and dry.  Neurological:      Mental Status: He is alert and oriented to person, place, and time.  Psychiatric:        Mood and Affect: Mood normal.        Behavior: Behavior normal.           Assessment & Plan:  1. Cervical postlaminectomy syndrome, Hx of ACDF C4-5, C6-7, C7-T1,02/2008. Continue current medication and alternate using heat and ice therapy.09/04/2020 2.  Spondylosis of L-spine . Continue current treatment regime.09/04/2020 3. ChronicPain SyndromeRefilled:Xtampza 18 mg one capsule every 12 hours for pain #60 andOxycodone 5/325mg  one tablet twice a day, may take a extra tablet when pain is sever# 75. Continue Voltaren Gel/ Flector Patch.09/04/2020 We will continue the opioid monitoring program, this consists of regular clinic visits, examinations, urine drug screen, pill counts as well as use of [place] Controlled Substance Reporting system. A 12 month History has been reviewed on the [place] Controlled Substance Reporting Systemon 09/04/2020. 4. Diabetes with neuropathy. Continuecurrent medication regimen withGabapentin.Keep blood sugars under tight control. Continue to monitor.09/04/2020 5.Cervicalgia/Cervical Radiculopathy/ ThoracicRadiculopathy/ LumbarRadiculopathy:Continue Gabapentin.09/04/2020 6.RightGreater Trochanter Bursitis: Continue Ice/Heat and Current medication regimen.09/04/2020 7. Chronic Pain Syndrome/ Myofascial Pain: Continue Flector Patch/ Alternates with Voltaren Gel.09/04/2020 8. Sacroiliac Pain:Continue HEP as Tolerated. Continue to Monitor.09/04/2020 9. Chronic Right Ankle Pain:No complaints today.Continue HEP as tolerated. Continue to Monitor.09/04/2020.  of face to face patient care time was spent during this visit. All questions were encouraged and answered.  F/U in 1 month

## 2020-09-08 LAB — DRUG TOX MONITOR 1 W/CONF, ORAL FLD
Alprazolam: NEGATIVE ng/mL (ref <0.50)
Amphetamines: NEGATIVE ng/mL (ref <10)
Barbiturates: NEGATIVE ng/mL (ref <10)
Benzodiazepines: POSITIVE ng/mL — AB (ref <0.50)
Buprenorphine: NEGATIVE ng/mL (ref <0.10)
Chlordiazepoxide: NEGATIVE ng/mL (ref <0.50)
Clonazepam: NEGATIVE ng/mL (ref <0.50)
Cocaine: NEGATIVE ng/mL (ref <5.0)
Codeine: NEGATIVE ng/mL (ref <2.5)
Diazepam: 1.08 ng/mL — ABNORMAL HIGH (ref <0.50)
Dihydrocodeine: NEGATIVE ng/mL (ref <2.5)
Fentanyl: NEGATIVE ng/mL (ref <0.10)
Flunitrazepam: NEGATIVE ng/mL (ref <0.50)
Flurazepam: NEGATIVE ng/mL (ref <0.50)
Heroin Metabolite: NEGATIVE ng/mL (ref <1.0)
Hydrocodone: NEGATIVE ng/mL (ref <2.5)
Hydromorphone: NEGATIVE ng/mL (ref <2.5)
Lorazepam: NEGATIVE ng/mL (ref <0.50)
MARIJUANA: NEGATIVE ng/mL (ref <2.5)
MDMA: NEGATIVE ng/mL (ref <10)
Meprobamate: NEGATIVE ng/mL (ref <2.5)
Methadone: NEGATIVE ng/mL (ref <5.0)
Midazolam: NEGATIVE ng/mL (ref <0.50)
Morphine: NEGATIVE ng/mL (ref <2.5)
Nicotine Metabolite: NEGATIVE ng/mL (ref <5.0)
Nordiazepam: 1.28 ng/mL — ABNORMAL HIGH (ref <0.50)
Norhydrocodone: NEGATIVE ng/mL (ref <2.5)
Noroxycodone: 23.6 ng/mL — ABNORMAL HIGH (ref <2.5)
Opiates: POSITIVE ng/mL — AB (ref <2.5)
Oxazepam: NEGATIVE ng/mL (ref <0.50)
Oxycodone: >250 ng/mL — ABNORMAL HIGH (ref <2.5)
Oxymorphone: NEGATIVE ng/mL (ref <2.5)
Phencyclidine: NEGATIVE ng/mL (ref <10)
Tapentadol: NEGATIVE ng/mL (ref <5.0)
Temazepam: NEGATIVE ng/mL (ref <0.50)
Tramadol: NEGATIVE ng/mL (ref <5.0)
Triazolam: NEGATIVE ng/mL (ref <0.50)
Zolpidem: NEGATIVE ng/mL (ref <5.0)

## 2020-09-08 LAB — DRUG TOX ALC METAB W/CON, ORAL FLD: Alcohol Metabolite: NEGATIVE ng/mL (ref <25)

## 2020-09-11 ENCOUNTER — Ambulatory Visit: Payer: No Typology Code available for payment source | Admitting: Registered Nurse

## 2020-09-13 ENCOUNTER — Telehealth: Payer: Self-pay | Admitting: *Deleted

## 2020-09-13 NOTE — Telephone Encounter (Signed)
Oral swab drug screen was consistent for prescribed medications.  ?

## 2020-10-02 ENCOUNTER — Other Ambulatory Visit: Payer: Self-pay

## 2020-10-02 ENCOUNTER — Encounter: Payer: Self-pay | Admitting: Registered Nurse

## 2020-10-02 ENCOUNTER — Encounter
Payer: No Typology Code available for payment source | Attending: Physical Medicine & Rehabilitation | Admitting: Registered Nurse

## 2020-10-02 VITALS — BP 130/77 | HR 96 | Temp 98.3°F | Ht 75.0 in | Wt 223.4 lb

## 2020-10-02 DIAGNOSIS — M546 Pain in thoracic spine: Secondary | ICD-10-CM | POA: Diagnosis present

## 2020-10-02 DIAGNOSIS — M47817 Spondylosis without myelopathy or radiculopathy, lumbosacral region: Secondary | ICD-10-CM | POA: Diagnosis present

## 2020-10-02 DIAGNOSIS — M542 Cervicalgia: Secondary | ICD-10-CM

## 2020-10-02 DIAGNOSIS — M961 Postlaminectomy syndrome, not elsewhere classified: Secondary | ICD-10-CM

## 2020-10-02 DIAGNOSIS — Z5181 Encounter for therapeutic drug level monitoring: Secondary | ICD-10-CM

## 2020-10-02 DIAGNOSIS — G8929 Other chronic pain: Secondary | ICD-10-CM | POA: Diagnosis present

## 2020-10-02 DIAGNOSIS — M5416 Radiculopathy, lumbar region: Secondary | ICD-10-CM | POA: Diagnosis present

## 2020-10-02 DIAGNOSIS — M7918 Myalgia, other site: Secondary | ICD-10-CM | POA: Diagnosis present

## 2020-10-02 DIAGNOSIS — M7061 Trochanteric bursitis, right hip: Secondary | ICD-10-CM

## 2020-10-02 DIAGNOSIS — M5412 Radiculopathy, cervical region: Secondary | ICD-10-CM

## 2020-10-02 DIAGNOSIS — Z79891 Long term (current) use of opiate analgesic: Secondary | ICD-10-CM | POA: Diagnosis present

## 2020-10-02 DIAGNOSIS — G894 Chronic pain syndrome: Secondary | ICD-10-CM

## 2020-10-02 MED ORDER — XTAMPZA ER 18 MG PO C12A: 1.0000 | EXTENDED_RELEASE_CAPSULE | Freq: Two times a day (BID) | ORAL | 0 refills | Status: DC

## 2020-10-02 MED ORDER — OXYCODONE-ACETAMINOPHEN 5-325 MG PO TABS: 1.0000 | ORAL_TABLET | Freq: Two times a day (BID) | ORAL | 0 refills | Status: DC

## 2020-10-02 NOTE — Progress Notes (Signed)
Subjective:    Patient ID: David [name], male    DOB: May 27, 1976, 43 y.o.   MRN: [id number]  HPI: David [name] is a 44 y.o. male who returns for follow up appointment for chronic pain and medication refill. He states his pain is located in his neck radiating into his left shoulder with tingling and burning, upper- lower back pain radiating into her his bilateral lower extremities L>R and right hip pain. He rates his pain 5. His current exercise regime is walking and performing stretching exercises.  David [name] Morphine equivalent is 72.75 MME. He  is also prescribed  Diazepam by Mauricio Po Np. We have discussed the black box warning of using opioids and benzodiazepines. I highlighted the dangers of using these drugs together and discussed the adverse events including respiratory suppression, overdose, cognitive impairment and importance of compliance with current regimen. We will continue to monitor and adjust as indicated.   Last Oral Swab was Performed on 09/04/2020, it was consistent.    Pain Inventory Average Pain 5 Pain Right Now 5 My pain is intermittent, constant, sharp, burning, dull, stabbing, tingling and aching  In the last 24 hours, has pain interfered with the following? General activity 5 Relation with others 5 Enjoyment of life 6 What TIME of day is your pain at its worst? morning , daytime, evening and night Sleep (in general) Fair  Pain is worse with: walking, bending, sitting, inactivity, standing and some activites Pain improves with: rest, heat/ice, therapy/exercise, pacing activities, medication and TENS Relief from Meds: 5  Family History  Problem Relation Age of Onset  . Arthritis Mother   . Heart disease Mother   . Diabetes Mother   . Heart disease Father   . Diabetes Father    Social History   Socioeconomic History  . Marital status: Single    Spouse name: Not on file  . Number of children: Not on file  . Years of education: Not on file  .  Highest education level: Not on file  Occupational History  . Not on file  Tobacco Use  . Smoking status: Never Smoker  . Smokeless tobacco: Never Used  Vaping Use  . Vaping Use: Former  Substance and Sexual Activity  . Alcohol use: No  . Drug use: No  . Sexual activity: Not on file  Other Topics Concern  . Not on file  Social History Narrative  . Not on file   Social Determinants of Health   Financial Resource Strain:   . Difficulty of Paying Living Expenses: Not on file  Food Insecurity:   . Worried About Programme researcher, broadcasting/film/video in the Last Year: Not on file  . Ran Out of Food in the Last Year: Not on file  Transportation Needs:   . Lack of Transportation (Medical): Not on file  . Lack of Transportation (Non-Medical): Not on file  Physical Activity:   . Days of Exercise per Week: Not on file  . Minutes of Exercise per Session: Not on file  Stress:   . Feeling of Stress : Not on file  Social Connections:   . Frequency of Communication with Friends and Family: Not on file  . Frequency of Social Gatherings with Friends and Family: Not on file  . Attends Religious Services: Not on file  . Active Member of Clubs or Organizations: Not on file  . Attends Banker Meetings: Not on file  . Marital Status: Not on file   No  past surgical history on file. No past surgical history on file. Past Medical History:  Diagnosis Date  . Chronic pain syndrome   . 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 Lumbago   . Pain in joint, lower leg   . Postlaminectomy syndrome, cervical region   . Primary localized osteoarthrosis, lower leg   . Sciatica   . Thoracic radiculopathy    There were no vitals taken for this visit.  Opioid Risk Score:   Fall Risk Score:  `1  Depression screen PHQ 2/9  Depression screen [facility] 2/9 07/11/2020 03/06/2020 03/03/2019 01/13/2019 12/16/2018 08/16/2018 06/24/2018  Decreased Interest 1 1 1 1  0 1 3  Down, Depressed, Hopeless 0 1  1 1  0 1 3  PHQ - 2 Score 1 2 2 2  0 2 6  Altered sleeping 2 - - - - - 3  Tired, decreased energy 1 - - - - - 3  Change in appetite 0 - - - - - 0  Feeling bad or failure about yourself  0 - - - - - 0  Trouble concentrating 0 - - - - - 0  Moving slowly or fidgety/restless 0 - - - - - 0  Suicidal thoughts 0 - - - - - 0  PHQ-9 Score 4 - - - - - 12  Difficult doing work/chores - - - - - - Not difficult at all   Review of Systems  Musculoskeletal: Positive for back pain, gait problem and neck pain.       RIGHT HIP  Neurological: Positive for numbness.       Tingling in the arms  All other systems reviewed and are negative.      Objective:   Physical Exam Vitals and nursing note reviewed.  Constitutional:      Appearance: Normal appearance.  Cardiovascular:     Rate and Rhythm: Normal rate and regular rhythm.     Pulses: Normal pulses.     Heart sounds: Normal heart sounds.  Pulmonary:     Effort: Pulmonary effort is normal.     Breath sounds: Normal breath sounds.  Musculoskeletal:     Cervical back: Normal range of motion and neck supple.     Comments: Normal Muscle Bulk and Muscle Testing Reveals:  Upper Extremities: Full ROM and Muscle Strength 5/5 Bilateral AC Joint Tenderness  Thoracic and Lumbar Hypersensitivity Right Greater Trochanter Tenderness  Lower Extremities: Full ROM and Muscle Strength 5/5 Arises from chair with ease Narrow Based Gait   Skin:    General: Skin is warm and dry.  Neurological:     Mental Status: He is alert and oriented to person, place, and time.  Psychiatric:        Mood and Affect: Mood normal.        Behavior: Behavior normal.           Assessment & Plan:  1. Cervical postlaminectomy syndrome, Hx of ACDF C4-5, C6-7, C7-T1,02/2008. Continue current medication and alternate using heat and ice therapy.10/02/2020 2. Spondylosis of L-spine . Continue current treatment regime.10/02/2020 3. ChronicPain SyndromeRefilled:Xtampza 18 mg  one capsule every 12 hours for pain #60 andOxycodone 5/325mg  one tablet twice a day, may take a extra tablet when pain is sever# 75. Continue Voltaren Gel/ Flector Patch.10/02/2020 We will continue the opioid monitoring program, this consists of regular clinic visits, examinations, urine drug screen, pill counts as well as use of 14/11/2019 Controlled Substance Reporting system. A 12 month History has  been reviewed on the [place] Controlled Substance Reporting Systemon12/11/2019. 4. Diabetes with neuropathy. Continuecurrent medication regimen withGabapentin.Keep blood sugars under tight control. Continue to monitor.10/02/2020 5.Cervicalgia/Cervical Radiculopathy/ ThoracicRadiculopathy/ LumbarRadiculopathy:Continue Gabapentin.10/02/2020 6.RightGreater Trochanter Bursitis: Continue Ice/Heat and Current medication regimen.10/02/2020 7. Chronic Pain Syndrome/ Myofascial Pain: Continue Flector Patch/ Alternates with Voltaren Gel.10/02/2020 8. Sacroiliac Pain:Continue HEP as Tolerated.Continue to Monitor.10/02/2020 9. Chronic Right Ankle Pain:No complaints today.Continue HEP as tolerated. Continue to Monitor.10/02/2020.  F/U in 1 month

## 2020-10-09 ENCOUNTER — Ambulatory Visit: Payer: No Typology Code available for payment source | Admitting: Registered Nurse

## 2020-11-06 ENCOUNTER — Other Ambulatory Visit: Payer: Self-pay

## 2020-11-06 ENCOUNTER — Encounter: Payer: Self-pay | Admitting: Registered Nurse

## 2020-11-06 ENCOUNTER — Encounter
Payer: No Typology Code available for payment source | Attending: Physical Medicine & Rehabilitation | Admitting: Registered Nurse

## 2020-11-06 VITALS — BP 117/73 | HR 85 | Temp 97.9°F | Ht 75.0 in | Wt 225.4 lb

## 2020-11-06 DIAGNOSIS — G894 Chronic pain syndrome: Secondary | ICD-10-CM | POA: Diagnosis present

## 2020-11-06 DIAGNOSIS — Z5181 Encounter for therapeutic drug level monitoring: Secondary | ICD-10-CM | POA: Diagnosis present

## 2020-11-06 DIAGNOSIS — M5416 Radiculopathy, lumbar region: Secondary | ICD-10-CM | POA: Diagnosis present

## 2020-11-06 DIAGNOSIS — M7061 Trochanteric bursitis, right hip: Secondary | ICD-10-CM | POA: Diagnosis present

## 2020-11-06 DIAGNOSIS — M25561 Pain in right knee: Secondary | ICD-10-CM | POA: Insufficient documentation

## 2020-11-06 DIAGNOSIS — M7918 Myalgia, other site: Secondary | ICD-10-CM | POA: Insufficient documentation

## 2020-11-06 DIAGNOSIS — M5412 Radiculopathy, cervical region: Secondary | ICD-10-CM | POA: Diagnosis present

## 2020-11-06 DIAGNOSIS — Z79891 Long term (current) use of opiate analgesic: Secondary | ICD-10-CM | POA: Diagnosis present

## 2020-11-06 DIAGNOSIS — M542 Cervicalgia: Secondary | ICD-10-CM | POA: Insufficient documentation

## 2020-11-06 DIAGNOSIS — G8929 Other chronic pain: Secondary | ICD-10-CM | POA: Insufficient documentation

## 2020-11-06 DIAGNOSIS — M25571 Pain in right ankle and joints of right foot: Secondary | ICD-10-CM | POA: Diagnosis present

## 2020-11-06 DIAGNOSIS — M546 Pain in thoracic spine: Secondary | ICD-10-CM | POA: Insufficient documentation

## 2020-11-06 DIAGNOSIS — M961 Postlaminectomy syndrome, not elsewhere classified: Secondary | ICD-10-CM | POA: Diagnosis present

## 2020-11-06 DIAGNOSIS — M47817 Spondylosis without myelopathy or radiculopathy, lumbosacral region: Secondary | ICD-10-CM | POA: Diagnosis present

## 2020-11-06 MED ORDER — OXYCODONE-ACETAMINOPHEN 5-325 MG PO TABS: 1.0000 | ORAL_TABLET | Freq: Two times a day (BID) | ORAL | 0 refills | Status: DC

## 2020-11-06 MED ORDER — XTAMPZA ER 18 MG PO C12A: 1.0000 | EXTENDED_RELEASE_CAPSULE | Freq: Two times a day (BID) | ORAL | 0 refills | Status: DC

## 2020-11-06 NOTE — Progress Notes (Signed)
Subjective:    Patient ID: David [name], male    DOB: 19-Dec-1975, 45 y.o.   MRN: [id number]  HPI: David [name] is a 45 y.o. male who returns for follow up appointment for chronic pain and medication refill. He states his pain is located in his neck radiating into his left shoulder, left arm and left ring and pinky finger with numbness and tingling. Also reports upper- lower back pain radiating into his bilateral lower extremities L, R and right hip. Also states he has right knee, right ankle and generalized joint pain. He rates his pain 5. His current exercise regime is walking and performing stretching exercises.  David [name] reports on 11/05/2019, he was walking outside and slid on the ice, he was able to brace himself and denies falling.   Also reports he is awaiting surgical date for hernia repair, he has an appointment with surgeon, he will send this provider with an update, he verbalizes understanding.   David [name] Morphine equivalent is 65.47 MME. She  is also prescribed Diazepam by Heide Scales NP.  We have discussed the black box warning of using opioids and benzodiazepines. I highlighted the dangers of using these drugs together and discussed the adverse events including respiratory suppression, overdose, cognitive impairment and importance of compliance with current regimen. We will continue to monitor and adjust as indicated.    Last Oral Swab was Performed on 09/04/2020, it was consistent.   Pain Inventory Average Pain 5 Pain Right Now 5 My pain is intermittent, constant, sharp, burning, dull, stabbing, tingling and aching  In the last 24 hours, has pain interfered with the following? General activity 5 Relation with others 5 Enjoyment of life 5 What TIME of day is your pain at its worst? morning , daytime, evening and night Sleep (in general) Fair  Pain is worse with: walking, bending, sitting, inactivity, standing, unsure and some activites Pain improves with: rest,  heat/ice, therapy/exercise, pacing activities, medication and TENS Relief from Meds: 4  Family History  Problem Relation Age of Onset  . Arthritis Mother   . Heart disease Mother   . Diabetes Mother   . Heart disease Father   . Diabetes Father    Social History   Socioeconomic History  . Marital status: Single    Spouse name: Not on file  . Number of children: Not on file  . Years of education: Not on file  . Highest education level: Not on file  Occupational History  . Not on file  Tobacco Use  . Smoking status: Never Smoker  . Smokeless tobacco: Never Used  Vaping Use  . Vaping Use: Former  Substance and Sexual Activity  . Alcohol use: No  . Drug use: No  . Sexual activity: Not on file  Other Topics Concern  . Not on file  Social History Narrative  . Not on file   Social Determinants of Health   Financial Resource Strain: Not on file  Food Insecurity: Not on file  Transportation Needs: Not on file  Physical Activity: Not on file  Stress: Not on file  Social Connections: Not on file   No past surgical history on file. No past surgical history on file. Past Medical History:  Diagnosis Date  . Chronic pain syndrome   . 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 Lumbago   . Pain in joint, lower leg   . Postlaminectomy syndrome, cervical region   . Primary localized osteoarthrosis,  lower leg   . Sciatica   . Thoracic radiculopathy    BP 117/73   Pulse 85   Temp 97.9 F (36.6 C)   Ht 6\' 3"  (1.905 m)   Wt 225 lb 6.4 oz (102.2 kg)   SpO2 99%   BMI 28.17 kg/m   Opioid Risk Score:   Fall Risk Score:  `1  Depression screen PHQ 2/9  Depression screen [facility] 2/9 10/02/2020 07/11/2020 03/06/2020 03/03/2019 01/13/2019 12/16/2018 08/16/2018  Decreased Interest 0 1 1 1 1  0 1  Down, Depressed, Hopeless 0 0 1 1 1  0 1  PHQ - 2 Score 0 1 2 2 2  0 2  Altered sleeping - 2 - - - - -  Tired, decreased energy - 1 - - - - -  Change in appetite - 0 - - -  - -  Feeling bad or failure about yourself  - 0 - - - - -  Trouble concentrating - 0 - - - - -  Moving slowly or fidgety/restless - 0 - - - - -  Suicidal thoughts - 0 - - - - -  PHQ-9 Score - 4 - - - - -  Difficult doing work/chores - - - - - - -    Review of Systems  Musculoskeletal: Positive for back pain and neck pain.       Left arm pain Both legs  All other systems reviewed and are negative.      Objective:   Physical Exam Vitals and nursing note reviewed.  Constitutional:      Appearance: Normal appearance.  Neck:     Comments: Cervical Paraspinal Tenderness: C-5 - C-6  Cardiovascular:     Rate and Rhythm: Normal rate and regular rhythm.     Pulses: Normal pulses.     Heart sounds: Normal heart sounds.  Pulmonary:     Effort: Pulmonary effort is normal.     Breath sounds: Normal breath sounds.  Musculoskeletal:     Cervical back: Normal range of motion and neck supple.     Comments: Normal Muscle Bulk and Muscle Testing Reveals:  Upper Extremities: Full ROM and Muscle Strength 5/5 Bilateral AC Joint Tenderness : L>R  Thoracic Hypersensitivity Lumbar Paraspinal Tenderness: L-4-L-5 Right Greater Trochanter tenderness Lower Extremities: Full ROM and Muscle Strength 5/5 Arises from Chair with ease  Narrow Based Gait   Skin:    General: Skin is warm and dry.  Neurological:     Mental Status: He is alert and oriented to person, place, and time.  Psychiatric:        Mood and Affect: Mood normal.        Behavior: Behavior normal.           Assessment & Plan:  1. Cervical postlaminectomy syndrome, Hx of ACDF C4-5, C6-7, C7-T1,02/2008. Continue current medication and alternate using heat and ice therapy.11/06/2020 2. Spondylosis of L-spine . Continue current treatment regime.11/06/2020 3. ChronicPain SyndromeRefilled:Xtampza 18 mg one capsule every 12 hours for pain #60 andOxycodone 5/325mg  one tablet twice a day, may take a extra tablet when pain is  sever# 75. Continue Voltaren Gel/ Flector Patch.11/06/2020 We will continue the opioid monitoring program, this consists of regular clinic visits, examinations, urine drug screen, pill counts as well as use of Controlled Substance Reporting system. A 12 month History has been reviewed on the 01/04/2021 Controlled Substance Reporting Systemon01/03/2021. 4. Diabetes with neuropathy. Continuecurrent medication regimen withGabapentin.Keep blood sugars under tight control. Continue to  monitor.11/06/2020 5.Cervicalgia/Cervical Radiculopathy/ ThoracicRadiculopathy/ LumbarRadiculopathy:Continue Gabapentin.11/06/2020 6.RightGreater Trochanter Bursitis: Continue Ice/Heat and Current medication regimen.11/06/2020 7. Chronic Pain Syndrome/ Myofascial Pain: Continue Flector Patch/ Alternates with Voltaren Gel.11/06/2020 8. Sacroiliac Pain:Continue HEP as Tolerated.Continue to Monitor.11/06/2020 9. Chronic Right Ankle Pain:.Continue HEP as tolerated. Continue to Monitor.11/06/2020.  F/U in 1 month

## 2020-11-11 ENCOUNTER — Ambulatory Visit: Payer: Self-pay | Admitting: Surgery

## 2020-11-11 DIAGNOSIS — K4090 Unilateral inguinal hernia, without obstruction or gangrene, not specified as recurrent: Secondary | ICD-10-CM | POA: Insufficient documentation

## 2020-11-11 NOTE — H&P (Signed)
David "Idelle Leech Appointment: 11/11/2020 8:45 AM Location: [facility] Patient #: [id number] DOB: 1976/09/01 Single / Language: [place] / Race: White Male  History of Present Illness David Sportsman MD; 11/11/2020 12:47 PM) The patient is a 45 year old male who presents with an inguinal hernia. Note for "Inguinal hernia": ` ` ` Patient sent for surgical consultation at the request of Bjorn Pippin, MD  Chief Complaint: Large left scrotal mass. Probable hernia ` ` The patient is a gentleman who felt a pulling in discomfort and swelling in his left groin a few years ago. He has gotten larger. He was trying to hold off on doing anything with the Covid pandemic but now has become much larger than this past year. Based concerns she discussed with urology. Inguinal hernia suspected. CAT scan suspicious for a large left inguinal hernia containing small bowel. Probable small right a hernia as well. Surgical consultation requested. Patient does not smoke. Moves his bowels every day. He walk several miles a day. 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 Recently diagnosed with diabetes stable on metformin. No sleep apnea. Never had any abdominal hernia surgery.  Of note with a CT scan there is concerned perhaps a mass on the appendix tip. Rather subtle. Patient denies any pain or history of appendicitis. No history of Crohn's  No personal nor family history of GI/colon cancer, inflammatory bowel disease, irritable bowel syndrome, allergy such as Celiac Sprue, dietary/dairy problems, colitis, ulcers nor gastritis. No recent sick contacts/gastroenteritis. No travel outside the country. No changes in diet. No dysphagia to solids or liquids. No significant heartburn or reflux. No melena, hematemesis, coffee ground emesis. No evidence of prior gastric/peptic ulceration.  (Review of systems as stated in this history (HPI) or in the  review of systems. Otherwise all other 12 point ROS are negative) ` ` ###########################################`  This patient encounter took 30 minutes today to perform the following: obtain history, perform exam, review outside records, interpret tests & imaging, counsel the patient on their diagnosis; and, document this encounter, including findings & plan in the electronic health record (EHR).   Past Surgical History Mertha Finders, CMA; 11/11/2020 8:40 AM) Spinal Surgery - Neck  Diagnostic Studies History Zigmund Gottron Marshall-McBride, CMA; 11/11/2020 8:40 AM) Colonoscopy >10 years ago  Allergies (Kheana Marshall-McBride, CMA; 11/11/2020 8:41 AM) Corticosteroids Depletion *DIETARY PRODUCTS/DIETARY MANAGEMENT PRODUCTS* Allergies Reconciled  Medication History (Kheana Marshall-McBride, CMA; 11/11/2020 8:42 AM) oxyCODONE-Acetaminophen (5-325MG  Tablet, Oral) Active. Xtampza ER (18MG  Cap 12HR Deter, Oral) Active. diazePAM (5MG  Tablet, Oral) Active. Gabapentin (400MG  Capsule, Oral) Active. Lisinopril-hydroCHLOROthiazide (20-25MG  Tablet, Oral) Active. metFORMIN HCl (500MG  Tablet, Oral) Active. Pravastatin Sodium (40MG  Tablet, Oral) Active. Medications Reconciled  Social History Marshall-McBride, CMA; 11/11/2020 8:40 AM) Caffeine use Carbonated beverages. No alcohol use No drug use Tobacco use Never smoker.  Family History , CMA; 11/11/2020 8:40 AM) Anesthetic complications Father. Arthritis Brother, Father, Mother. Depression Father, Mother. Heart Disease Mother. Hypertension Father, Mother. Prostate Cancer Father.  Other Problems Marshall-McBride, CMA; 11/11/2020 8:40 AM) Anxiety Disorder Arthritis Back Pain Depression Diabetes Mellitus Gastroesophageal Reflux Disease High blood pressure Hypercholesterolemia Inguinal Hernia Kidney Stone     Review of Systems 01/09/2021 Marshall-McBride CMA;  11/11/2020 8:40 AM) General Not Present- Appetite Loss, Chills, Fatigue, Fever, Night Sweats, Weight Gain and Weight Loss. Skin Not Present- Change in Wart/Mole, Dryness, Hives, Jaundice, New Lesions, Non-Healing Wounds, Rash and Ulcer. HEENT Present- Ringing in the Ears, Seasonal Allergies and Wears  glasses/contact lenses. Not Present- Earache, Hearing Loss, Hoarseness, Nose Bleed, Oral Ulcers, Sinus Pain, Sore Throat, Visual Disturbances and Yellow Eyes. Breast Not Present- Breast Mass, Breast Pain, Nipple Discharge and Skin Changes. Cardiovascular Present- Leg Cramps. Not Present- Chest Pain, Difficulty Breathing Lying Down, Palpitations, Rapid Heart Rate, Shortness of Breath and Swelling of Extremities. Gastrointestinal Present- Bloating. Not Present- Abdominal Pain, Bloody Stool, Change in Bowel Habits, Chronic diarrhea, Constipation, Difficulty Swallowing, Excessive gas, Gets full quickly at meals, Hemorrhoids, Indigestion, Nausea, Rectal Pain and Vomiting. Male Genitourinary Not Present- Blood in Urine, Change in Urinary Stream, Frequency, Impotence, Nocturia, Painful Urination, Urgency and Urine Leakage. Musculoskeletal Present- Back Pain, Joint Pain, Joint Stiffness and Muscle Pain. Not Present- Muscle Weakness and Swelling of Extremities. Neurological Present- Numbness and Tingling. Not Present- Decreased Memory, Fainting, Headaches, Seizures, Tremor, Trouble walking and Weakness. Psychiatric Present- Anxiety and Depression. Not Present- Bipolar, Change in Sleep Pattern, Fearful and Frequent crying. Endocrine Not Present- Cold Intolerance, Excessive Hunger, Hair Changes, Heat Intolerance, Hot flashes and New Diabetes. Hematology Not Present- Blood Thinners, Easy Bruising, Excessive bleeding, Gland problems, HIV and Persistent Infections.  Vitals (Kheana Marshall-McBride CMA; 11/11/2020 8:42 AM) 11/11/2020 8:42 AM Weight: 225.13 lb Height: 71in Body Surface Area: 2.22 m Body Mass Index:  31.4 kg/m  Temp.: 98.46F  Pulse: 103 (Regular)  P.OX: 98% (Room air) BP: 136/84(Sitting, Left Arm, Standard)        Physical Exam David Sportsman MD; 11/11/2020 9:01 AM)  General Mental Status-Alert. General Appearance-Not in acute distress, Not Sickly. Orientation-Oriented X3. Hydration-Well hydrated. Voice-Normal.  Integumentary Global Assessment Upon inspection and palpation of skin surfaces of the - Axillae: non-tender, no inflammation or ulceration, no drainage. and Distribution of scalp and body hair is normal. General Characteristics Temperature - normal warmth is noted.  Head and Neck Head-normocephalic, atraumatic with no lesions or palpable masses. Face Global Assessment - atraumatic, no absence of expression. Neck Global Assessment - no abnormal movements, no bruit auscultated on the right, no bruit auscultated on the left, no decreased range of motion, non-tender. Trachea-midline. Thyroid Gland Characteristics - non-tender.  Eye Eyeball - Left-Extraocular movements intact, No Nystagmus - Left. Eyeball - Right-Extraocular movements intact, No Nystagmus - Right. Cornea - Left-No Hazy - Left. Cornea - Right-No Hazy - Right. Sclera/Conjunctiva - Left-No scleral icterus, No Discharge - Left. Sclera/Conjunctiva - Right-No scleral icterus, No Discharge - Right. Pupil - Left-Direct reaction to light normal. Pupil - Right-Direct reaction to light normal. Note: Wears glasses. Vision corrected  ENMT Ears Pinna - Left - no drainage observed, no generalized tenderness observed. Pinna - Right - no drainage observed, no generalized tenderness observed. Nose and Sinuses External Inspection of the Nose - no destructive lesion observed. Inspection of the nares - Left - quiet respiration. Inspection of the nares - Right - quiet respiration. Mouth and Throat Lips - Upper Lip - no fissures observed, no pallor noted. Lower Lip - no  fissures observed, no pallor noted. Nasopharynx - no discharge present. Oral Cavity/Oropharynx - Tongue - no dryness observed. Oral Mucosa - no cyanosis observed. Hypopharynx - no evidence of airway distress observed.  Chest and Lung Exam Inspection Movements - Normal and Symmetrical. Accessory muscles - No use of accessory muscles in breathing. Palpation Palpation of the chest reveals - Non-tender. Auscultation Breath sounds - Normal and Clear.  Cardiovascular Auscultation Rhythm - Regular. Murmurs & Other Heart Sounds - Auscultation of the heart reveals - No Murmurs and No Systolic Clicks.  Abdomen Inspection Inspection of the  abdomen reveals - No Visible peristalsis and No Abnormal pulsations. Umbilicus - No Bleeding, No Urine drainage. Palpation/Percussion Palpation and Percussion of the abdomen reveal - Soft, Non Tender, No Rebound tenderness, No Rigidity (guarding) and No Cutaneous hyperesthesia. Note: Abdomen soft. Nontender. Not distended. No umbilical or incisional hernias. No guarding.  Male Genitourinary Sexual Maturity Tanner 5 - Adult hair pattern and Adult penile size and shape. Note: Very large left scrotal soft mass about the size of a volleyball. Consistent with very large left inguinal hernia. Small right-sided inguinal hernia as well.  Peripheral Vascular Upper Extremity Inspection - Left - No Cyanotic nailbeds - Left, Not Ischemic. Inspection - Right - No Cyanotic nailbeds - Right, Not Ischemic.  Neurologic Neurologic evaluation reveals -normal attention span and ability to concentrate, able to name objects and repeat phrases. Appropriate fund of knowledge , normal sensation and normal coordination. Mental Status Affect - not angry, not paranoid. Cranial Nerves-Normal Bilaterally. Gait-Normal.  Neuropsychiatric Mental status exam performed with findings of-able to articulate well with normal speech/language, rate, volume and coherence, thought  content normal with ability to perform basic computations and apply abstract reasoning and no evidence of hallucinations, delusions, obsessions or homicidal/suicidal ideation.  Musculoskeletal Global Assessment Spine, Ribs and Pelvis - no instability, subluxation or laxity. Right Upper Extremity - no instability, subluxation or laxity.  Lymphatic Head & Neck  General Head & Neck Lymphatics: Bilateral - Description - No Localized lymphadenopathy. Axillary  General Axillary Region: Bilateral - Description - No Localized lymphadenopathy. Femoral & Inguinal  Generalized Femoral & Inguinal Lymphatics: Left - Description - No Localized lymphadenopathy. Right - Description - No Localized lymphadenopathy.    Assessment & Plan David Sportsman MD; 11/11/2020 12:46 PM)  Karen Kays HERNIA (K40.90) Impression: Very large left scrotal hernia with small bowel within it. The size of a volleyball.  He would benefit from surgery for reduction and repair. We'll try laparoscopic preperitoneal approach. Most likely will have to be a modified TAPP approach with larger sheets of mesh. Defect contralateral right side as well. He is at high risk for major hematoma. Outpatient setting. I do not think this can wait several months. He is otherwise in decent physical condition to tolerate surgery   RIGHT INGUINAL HERNIA (K40.90) Impression: Small but definitely will hernia by exam and CT scan. Would plan mesh repair at the same time.   PREOP - ING HERNIA - ENCOUNTER FOR PREOPERATIVE EXAMINATION FOR GENERAL SURGICAL PROCEDURE (Z01.818)  Current Plans You are being scheduled for surgery- Our schedulers will call you.  You should hear from our office's scheduling department within 5 working days about the location, date, and time of surgery. We try to make accommodations for patient's preferences in scheduling surgery, but sometimes the OR schedule or the surgeon's schedule prevents [place] from making those  accommodations.  If you have not heard from our office [phone]) in 5 working days, call the office and ask for your surgeon's nurse.  If you have other questions about your diagnosis, plan, or surgery, call the office and ask for your surgeon's nurse.  Written instructions provided The anatomy & physiology of the abdominal wall and pelvic floor was discussed. The pathophysiology of hernias in the inguinal and pelvic region was discussed. Natural history risks such as progressive enlargement, pain, incarceration, and strangulation was discussed. Contributors to complications such as smoking, obesity, diabetes, prior surgery, etc were discussed.  I feel the risks of no intervention will lead to serious problems that outweigh the operative risks;  therefore, I recommended surgery to reduce and repair the hernia. I explained laparoscopic techniques with possible need for an open approach. I noted usual use of mesh to patch and/or buttress hernia repair  Risks such as bleeding, infection, abscess, need for further treatment, heart attack, death, and other risks were discussed. I noted a good likelihood this will help address the problem. Goals of post-operative recovery were discussed as well. Possibility that this will not correct all symptoms was explained. I stressed the importance of low-impact activity, aggressive pain control, avoiding constipation, & not pushing through pain to minimize risk of post-operative chronic pain or injury. Possibility of reherniation was discussed. We will work to minimize complications.  An educational handout further explaining the pathology & treatment options was given as well. Questions were answered. The patient expresses understanding & wishes to proceed with surgery.  Pt Education - Pamphlet Given - Laparoscopic Hernia Repair: discussed with patient and provided information. Pt Education - [facility] Pain Control (Anjela Cassara) Pt Education - [facility] Hernia  Post-Op HCI (Martin Belling): discussed with patient and provided information. Pt Education - [facility] Mesh education: discussed with patient and provided information.  MASS OF APPENDIX (K38.8) Impression: Possible mass on appendix. Seems subtle to my reading of the CAT scan. He has no GI complaints. Will evaluate with laparoscopy. If truly abnormal or concerning, may do appendectomy first & delay hernia surgery 6-12 weeks later.. Otherwise tried hold off and fix the hernia since that seems to be more pressing issue.  Current Plans The anatomy & physiology of the digestive tract was discussed. The pathophysiology of intestinal obstruction was discussed. Natural history risks without surgery was discussed. Differential diagnosis discuseed. I feel the patient has failed non-operative therapies. The risks of no intervention will lead to serious problems such as necrosis, perforation, dehydration, etc. that outweigh the operative risks; therefore, I recommended abdominal exploration to diagnose & treat the source of the problem. Minimally Invasive & open techniques were discussed. I expressed a good likelihood that surgery will treat the problem.  Risks such as bleeding, infection, abscess, leak, reoperation, bowel resection, possible ostomy, hernia, heart attack, death, and other risks were discussed. I noted a good likelihood this will help address the problem. Goals of post-operative recovery were discussed as well. We will work to minimize complications. Questions were answered. The patient expresses understanding & wishes to proceed with surgery.  David SportsmanSteven C. Dawnelle Warman, MD, FACS, MASCRS Gastrointestinal and Minimally Invasive Surgery  [facility] [address] [place] [id number] [phone] Fax [phone] Main/Paging  CONTACT INFORMATION: Weekday (9AM-5PM) concerns: Call [facility] main office at [phone] Weeknight (5PM-9AM) or Weekend/Holiday concerns: Check  [email or web address] for General Surgery [facility] coverage (Please, do not use SecureChat as it is not reliable communication to operating surgeons for immediate patient care)

## 2020-12-04 ENCOUNTER — Other Ambulatory Visit: Payer: Self-pay

## 2020-12-04 ENCOUNTER — Encounter
Payer: No Typology Code available for payment source | Attending: Physical Medicine & Rehabilitation | Admitting: Registered Nurse

## 2020-12-04 ENCOUNTER — Encounter: Payer: Self-pay | Admitting: Registered Nurse

## 2020-12-04 VITALS — BP 138/76 | HR 87 | Temp 98.8°F | Ht 75.0 in | Wt 223.0 lb

## 2020-12-04 DIAGNOSIS — Z5181 Encounter for therapeutic drug level monitoring: Secondary | ICD-10-CM | POA: Insufficient documentation

## 2020-12-04 DIAGNOSIS — M7061 Trochanteric bursitis, right hip: Secondary | ICD-10-CM | POA: Diagnosis present

## 2020-12-04 DIAGNOSIS — M7918 Myalgia, other site: Secondary | ICD-10-CM | POA: Diagnosis present

## 2020-12-04 DIAGNOSIS — M961 Postlaminectomy syndrome, not elsewhere classified: Secondary | ICD-10-CM | POA: Insufficient documentation

## 2020-12-04 DIAGNOSIS — M5412 Radiculopathy, cervical region: Secondary | ICD-10-CM | POA: Insufficient documentation

## 2020-12-04 DIAGNOSIS — M5416 Radiculopathy, lumbar region: Secondary | ICD-10-CM | POA: Diagnosis present

## 2020-12-04 DIAGNOSIS — M546 Pain in thoracic spine: Secondary | ICD-10-CM | POA: Diagnosis present

## 2020-12-04 DIAGNOSIS — Z79891 Long term (current) use of opiate analgesic: Secondary | ICD-10-CM | POA: Diagnosis present

## 2020-12-04 DIAGNOSIS — M47817 Spondylosis without myelopathy or radiculopathy, lumbosacral region: Secondary | ICD-10-CM | POA: Insufficient documentation

## 2020-12-04 DIAGNOSIS — G8929 Other chronic pain: Secondary | ICD-10-CM | POA: Diagnosis present

## 2020-12-04 DIAGNOSIS — M542 Cervicalgia: Secondary | ICD-10-CM | POA: Diagnosis present

## 2020-12-04 DIAGNOSIS — G894 Chronic pain syndrome: Secondary | ICD-10-CM | POA: Diagnosis present

## 2020-12-04 MED ORDER — OXYCODONE-ACETAMINOPHEN 5-325 MG PO TABS
1.0000 | ORAL_TABLET | Freq: Two times a day (BID) | ORAL | 0 refills | Status: DC
Start: 2020-12-04 — End: 2021-01-01

## 2020-12-04 MED ORDER — XTAMPZA ER 18 MG PO C12A
1.0000 | EXTENDED_RELEASE_CAPSULE | Freq: Two times a day (BID) | ORAL | 0 refills | Status: DC
Start: 2020-12-04 — End: 2021-01-01

## 2020-12-04 NOTE — Progress Notes (Signed)
Subjective:    Patient ID: David [name], male    DOB: [birth year], 45 y.o.   MRN: [id number]  HPI: David [name] is a 45 y.o. male who returns for follow up appointment for chronic pain and medication refill. He states his pain is located in his neck radiating into his left shoulder, upper-mid-back and lower back pain radiating into her bilateral lower extremities L>R. Also reports right hip pain and right ankle pain. He rates his pain 6. His current exercise regime is walking and performing stretching exercises.  [name] Morphine equivalent is  54.00 MME.  He is also prescribed Diazepam by Mauricio Po Np. We have discussed the black box warning of using opioids and benzodiazepines. I highlighted the dangers of using these drugs together and discussed the adverse events including respiratory suppression, overdose, cognitive impairment and importance of compliance with current regimen. We will continue to monitor and adjust as indicated.    Last Oral Swab was Performed on 09/04/2020, it was consistent.    Pain Inventory Average Pain 6 Pain Right Now 6 My pain is intermittent, constant, sharp, burning, dull, stabbing, tingling and aching  In the last 24 hours, has pain interfered with the following? General activity 6 Relation with others 5 Enjoyment of life 6 What TIME of day is your pain at its worst? morning , daytime, evening and night Sleep (in general) Fair  Pain is worse with: walking, bending, sitting, inactivity, standing and some activites Pain improves with: rest, heat/ice, therapy/exercise, pacing activities, medication and TENS Relief from Meds: 5  Family History  Problem Relation Age of Onset   Arthritis Mother    Heart disease Mother    Diabetes Mother    Heart disease Father    Diabetes Father    Social History   Socioeconomic History   Marital status: Single    Spouse name: Not on file   Number of children: Not on file   Years of education:  Not on file   Highest education level: Not on file  Occupational History   Not on file  Tobacco Use   Smoking status: Never Smoker   Smokeless tobacco: Never Used  Vaping Use   Vaping Use: Former  Substance and Sexual Activity   Alcohol use: No   Drug use: No   Sexual activity: Not on file  Other Topics Concern   Not on file  Social History Narrative   Not on file   Social Determinants of Health   Financial Resource Strain: Not on file  Food Insecurity: Not on file  Transportation Needs: Not on file  Physical Activity: Not on file  Stress: Not on file  Social Connections: Not on file   History reviewed. No pertinent surgical history. History reviewed. No pertinent surgical history. 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    Lumbago    Pain in joint, lower leg    Postlaminectomy syndrome, cervical region    Primary localized osteoarthrosis, lower leg    Sciatica    Thoracic radiculopathy    BP 138/76    Pulse 87    Temp 98.8 F (37.1 C)    Ht 6\' 3"  (1.905 m)    Wt 223 lb (101.2 kg)    SpO2 99%    BMI 27.87 kg/m   Opioid Risk Score:   Fall Risk Score:  `1  Depression screen PHQ 2/9  Depression  screen [facility] 2/9 10/02/2020 07/11/2020 03/06/2020 03/03/2019 01/13/2019 12/16/2018 08/16/2018  Decreased Interest 0 1 1 1 1  0 1  Down, Depressed, Hopeless 0 0 1 1 1  0 1  PHQ - 2 Score 0 1 2 2 2  0 2  Altered sleeping - 2 - - - - -  Tired, decreased energy - 1 - - - - -  Change in appetite - 0 - - - - -  Feeling bad or failure about yourself  - 0 - - - - -  Trouble concentrating - 0 - - - - -  Moving slowly or fidgety/restless - 0 - - - - -  Suicidal thoughts - 0 - - - - -  PHQ-9 Score - 4 - - - - -  Difficult doing work/chores - - - - - - -   Review of Systems  Musculoskeletal: Positive for back pain, gait problem and neck pain.       Pain in both legs, both arms,left  shouder  All other systems reviewed and are negative.      Objective:   Physical Exam Vitals and nursing note reviewed.  Constitutional:      Appearance: Normal appearance.  Neck:     Comments: Cervical Paraspinal Tenderness: C-5-C-6 Cardiovascular:     Rate and Rhythm: Normal rate and regular rhythm.     Pulses: Normal pulses.     Heart sounds: Normal heart sounds.  Pulmonary:     Effort: Pulmonary effort is normal.     Breath sounds: Normal breath sounds.  Musculoskeletal:     Cervical back: Normal range of motion and neck supple.     Comments: Normal Muscle Bulk and Muscle Testing Reveals:  Upper Extremities: Full ROM and Muscle Strength 5/5 Bilateral AC Joint Tenderness  Thoracic and Lumbar Hypersensitivity Right Greater Trochanter Tenderness Lower Extremities: Full ROM and Muscle Strength 5/5 Arises from Table with ease Narrow Based  Gait   Skin:    General: Skin is warm and dry.  Neurological:     Mental Status: He is alert and oriented to person, place, and time.  Psychiatric:        Mood and Affect: Mood normal.        Behavior: Behavior normal.           Assessment & Plan:  1. Cervical postlaminectomy syndrome, Hx of ACDF C4-5, C6-7, C7-T1,02/2008. Continue current medication and alternate using heat and ice therapy.12/05/2020 2. Spondylosis of L-spine . Continue current treatment regime.12/05/2020 3. ChronicPain SyndromeRefilled:Xtampza 18 mg one capsule every 12 hours for pain #60 andOxycodone 5/325mg  one tablet twice a day, may take a extra tablet when pain is sever# 75. Continue Voltaren Gel/ Flector Patch.11/06/2020 We will continue the opioid monitoring program, this consists of regular clinic visits, examinations, urine drug screen, pill counts as well as use of 02/02/2021 Controlled Substance Reporting system. A 12 month History has been reviewed on the 02/02/2021 Controlled Substance Reporting Systemon02/12/2020. 4. Diabetes with  neuropathy. Continuecurrent medication regimen withGabapentin.Keep blood sugars under tight control. Continue to monitor.12/05/2020 5.Cervicalgia/Cervical Radiculopathy/ ThoracicRadiculopathy/ LumbarRadiculopathy:Continue Gabapentin.12/05/2020 6.RightGreater Trochanter Bursitis: Continue Ice/Heat and Current medication regimen.12/05/2020 7. Chronic Pain Syndrome/ Myofascial Pain: Continue Flector Patch/ Alternates with Voltaren Gel.12/05/2020 8. Sacroiliac Pain:Continue HEP as Tolerated.Continue to Monitor.12/05/2020 9. Chronic Right Ankle Pain:.Continue HEP as tolerated. Continue to Monitor.12/05/2020.  F/U in 1 month

## 2020-12-10 ENCOUNTER — Other Ambulatory Visit (HOSPITAL_COMMUNITY)
Admission: RE | Admit: 2020-12-10 | Discharge: 2020-12-10 | Disposition: A | Discharge status: Home / Self Care | Payer: No Typology Code available for payment source | Source: Ambulatory Visit | Attending: Surgery | Admitting: Surgery

## 2020-12-10 ENCOUNTER — Encounter (HOSPITAL_BASED_OUTPATIENT_CLINIC_OR_DEPARTMENT_OTHER): Payer: Self-pay | Admitting: Surgery

## 2020-12-10 DIAGNOSIS — Z20822 Contact with and (suspected) exposure to covid-19: Secondary | ICD-10-CM | POA: Diagnosis not present

## 2020-12-10 DIAGNOSIS — Z01812 Encounter for preprocedural laboratory examination: Secondary | ICD-10-CM | POA: Diagnosis not present

## 2020-12-10 LAB — SARS CORONAVIRUS 2 (TAT 6-24 HRS): SARS Coronavirus 2: NEGATIVE

## 2020-12-11 ENCOUNTER — Encounter (HOSPITAL_BASED_OUTPATIENT_CLINIC_OR_DEPARTMENT_OTHER): Payer: Self-pay | Admitting: Surgery

## 2020-12-11 ENCOUNTER — Other Ambulatory Visit: Payer: Self-pay

## 2020-12-11 NOTE — Progress Notes (Signed)
Spoke w/ via phone for pre-op interview--- PT Lab needs dos----Istat and EKG               Lab results------ no COVID test ------ done 12-10-2020 negative result in epic Arrive at ------- 0800 on 12-13-2020 NPO after MN NO Solid Food.  Clear liquids from MN until--- 0700 Medications to take morning of surgery ----- David [name], Valium Diabetic medication ----- do not take metformin morning of surgery  Patient Special Instructions ----- n/a Pre-Op special Istructions ----- n/a Patient verbalized understanding of instructions that were given at this phone interview. Patient denies shortness of breath, chest pain, fever, cough at this phone interview.

## 2020-12-12 NOTE — Anesthesia Preprocedure Evaluation (Signed)
Anesthesia Evaluation  Patient identified by MRN, date of birth, ID band Patient awake    Reviewed: Allergy & Precautions, NPO status , Patient's Chart, lab work & pertinent test results  History of Anesthesia Complications Negative for: history of anesthetic complications  Airway Mallampati: IV  TM Distance: >3 FB Neck ROM: Full    Dental no notable dental hx.    Pulmonary neg pulmonary ROS,    Pulmonary exam normal        Cardiovascular hypertension, Pt. on medications Normal cardiovascular exam     Neuro/Psych S/P ACDF negative psych ROS   GI/Hepatic Neg liver ROS, GERD  Controlled,  Endo/Other  diabetes, Type 2  Renal/GU negative Renal ROS  negative genitourinary   Musculoskeletal  (+) Arthritis , Chronic pain on opioids   Abdominal   Peds  Hematology negative hematology ROS (+)   Anesthesia Other Findings LEFT SCROTAL AND RIGHT INGUINAL HERNIA   Reproductive/Obstetrics negative OB ROS                            Anesthesia Physical Anesthesia Plan  ASA: II  Anesthesia Plan: General   Post-op Pain Management:    Induction: Intravenous  PONV Risk Score and Plan: 3 and Treatment may vary due to age or medical condition, Ondansetron and Midazolam  Airway Management Planned: Oral ETT and Video Laryngoscope Planned  Additional Equipment: None  Intra-op Plan:   Post-operative Plan: Extubation in OR  Informed Consent: I have reviewed the patients History and Physical, chart, labs and discussed the procedure including the risks, benefits and alternatives for the proposed anesthesia with the patient or authorized representative who has indicated his/her understanding and acceptance.     Dental advisory given  Plan Discussed with: CRNA  Anesthesia Plan Comments: (No decadron. Ketamine 50mg  IV prior to incision.)       Anesthesia Quick Evaluation

## 2020-12-13 ENCOUNTER — Ambulatory Visit (HOSPITAL_BASED_OUTPATIENT_CLINIC_OR_DEPARTMENT_OTHER): Payer: No Typology Code available for payment source | Admitting: Anesthesiology

## 2020-12-13 ENCOUNTER — Encounter (HOSPITAL_BASED_OUTPATIENT_CLINIC_OR_DEPARTMENT_OTHER): Payer: Self-pay | Admitting: Surgery

## 2020-12-13 ENCOUNTER — Encounter (HOSPITAL_BASED_OUTPATIENT_CLINIC_OR_DEPARTMENT_OTHER)
Admission: RE | Disposition: A | Discharge status: Home / Self Care | Payer: Self-pay | Source: Home / Self Care | Attending: Surgery

## 2020-12-13 ENCOUNTER — Ambulatory Visit (HOSPITAL_BASED_OUTPATIENT_CLINIC_OR_DEPARTMENT_OTHER)
Admission: RE | Admit: 2020-12-13 | Discharge: 2020-12-13 | Disposition: A | Discharge status: Home / Self Care | Payer: No Typology Code available for payment source | Attending: Surgery | Admitting: Surgery

## 2020-12-13 DIAGNOSIS — K4090 Unilateral inguinal hernia, without obstruction or gangrene, not specified as recurrent: Secondary | ICD-10-CM | POA: Diagnosis present

## 2020-12-13 DIAGNOSIS — Z79899 Other long term (current) drug therapy: Secondary | ICD-10-CM | POA: Diagnosis not present

## 2020-12-13 DIAGNOSIS — Z7984 Long term (current) use of oral hypoglycemic drugs: Secondary | ICD-10-CM | POA: Diagnosis not present

## 2020-12-13 DIAGNOSIS — K4020 Bilateral inguinal hernia, without obstruction or gangrene, not specified as recurrent: Secondary | ICD-10-CM | POA: Insufficient documentation

## 2020-12-13 HISTORY — DX: Radiculopathy, site unspecified: M54.10

## 2020-12-13 HISTORY — DX: Personal history of urinary calculi: Z87.442

## 2020-12-13 HISTORY — DX: Spondylosis without myelopathy or radiculopathy, lumbar region: M47.816

## 2020-12-13 HISTORY — DX: Nocturia: R35.1

## 2020-12-13 HISTORY — DX: Polyneuropathy, unspecified: G62.9

## 2020-12-13 HISTORY — DX: Family history of other specified conditions: Z84.89

## 2020-12-13 HISTORY — DX: Type 2 diabetes mellitus without complications: E11.9

## 2020-12-13 HISTORY — DX: Essential (primary) hypertension: I10

## 2020-12-13 HISTORY — DX: Trochanteric bursitis, right hip: M70.61

## 2020-12-13 HISTORY — DX: Presence of spectacles and contact lenses: Z97.3

## 2020-12-13 HISTORY — DX: Other complications of anesthesia, initial encounter: T88.59XA

## 2020-12-13 HISTORY — DX: Gastro-esophageal reflux disease without esophagitis: K21.9

## 2020-12-13 HISTORY — DX: Bilateral inguinal hernia, without obstruction or gangrene, not specified as recurrent: K40.20

## 2020-12-13 HISTORY — DX: Cervicalgia: M54.2

## 2020-12-13 HISTORY — PX: INGUINAL HERNIA REPAIR: SHX194

## 2020-12-13 LAB — CBC
HCT: 34.8 % — ABNORMAL LOW (ref 39.0–52.0)
Hemoglobin: 12 g/dL — ABNORMAL LOW (ref 13.0–17.0)
MCH: 32 pg (ref 26.0–34.0)
MCHC: 34.5 g/dL (ref 30.0–36.0)
MCV: 92.8 fL (ref 80.0–100.0)
Platelets: 60 10*3/uL — ABNORMAL LOW (ref 150–400)
RBC: 3.75 MIL/uL — ABNORMAL LOW (ref 4.22–5.81)
RDW: 12.2 % (ref 11.5–15.5)
WBC: 3.7 10*3/uL — ABNORMAL LOW (ref 4.0–10.5)
nRBC: 0 % (ref 0.0–0.2)

## 2020-12-13 LAB — POCT I-STAT, CHEM 8
BUN: 27 mg/dL — ABNORMAL HIGH (ref 6–20)
Calcium, Ion: 1.22 mmol/L (ref 1.15–1.40)
Chloride: 95 mmol/L — ABNORMAL LOW (ref 98–111)
Creatinine, Ser: 0.8 mg/dL (ref 0.61–1.24)
Glucose, Bld: 145 mg/dL — ABNORMAL HIGH (ref 70–99)
HCT: 26 % — ABNORMAL LOW (ref 39.0–52.0)
Hemoglobin: 8.8 g/dL — ABNORMAL LOW (ref 13.0–17.0)
Potassium: 3.8 mmol/L (ref 3.5–5.1)
Sodium: 139 mmol/L (ref 135–145)
TCO2: 31 mmol/L (ref 22–32)

## 2020-12-13 LAB — TYPE AND SCREEN
ABO/RH(D): O POS
Antibody Screen: NEGATIVE

## 2020-12-13 LAB — ABO/RH: ABO/RH(D): O POS

## 2020-12-13 SURGERY — REPAIR, HERNIA, INGUINAL, BILATERAL, LAPAROSCOPIC
Anesthesia: General | Laterality: Bilateral

## 2020-12-13 MED ORDER — BUPIVACAINE LIPOSOME 1.3 % IJ SUSP: 20.0000 mL | Freq: Once | INTRAMUSCULAR | Status: DC

## 2020-12-13 MED ORDER — ONDANSETRON HCL 4 MG/2ML IJ SOLN
INTRAMUSCULAR | Status: AC
  Filled 2020-12-13: qty 4

## 2020-12-13 MED ORDER — FENTANYL CITRATE (PF) 100 MCG/2ML IJ SOLN
25.0000 ug | INTRAMUSCULAR | Status: DC | PRN
  Administered 2020-12-13: 50 ug via INTRAVENOUS

## 2020-12-13 MED ORDER — PHENYLEPHRINE 40 MCG/ML (10ML) SYRINGE FOR IV PUSH (FOR BLOOD PRESSURE SUPPORT)
PREFILLED_SYRINGE | INTRAVENOUS | Status: DC | PRN
  Administered 2020-12-13: 80 ug via INTRAVENOUS
  Administered 2020-12-13: 40 ug via INTRAVENOUS
  Administered 2020-12-13 (×2): 80 ug via INTRAVENOUS
  Administered 2020-12-13 (×2): 40 ug via INTRAVENOUS
  Administered 2020-12-13 (×2): 80 ug via INTRAVENOUS
  Administered 2020-12-13 (×3): 40 ug via INTRAVENOUS

## 2020-12-13 MED ORDER — MIDAZOLAM HCL 2 MG/2ML IJ SOLN
INTRAMUSCULAR | Status: AC
  Filled 2020-12-13: qty 2

## 2020-12-13 MED ORDER — ACETAMINOPHEN 500 MG PO TABS: 1000.0000 mg | ORAL_TABLET | ORAL | Status: DC

## 2020-12-13 MED ORDER — LIDOCAINE 2% (20 MG/ML) 5 ML SYRINGE
INTRAMUSCULAR | Status: DC | PRN
  Administered 2020-12-13: 100 mg via INTRAVENOUS

## 2020-12-13 MED ORDER — PHENYLEPHRINE 40 MCG/ML (10ML) SYRINGE FOR IV PUSH (FOR BLOOD PRESSURE SUPPORT)
PREFILLED_SYRINGE | INTRAVENOUS | Status: AC
  Filled 2020-12-13: qty 10

## 2020-12-13 MED ORDER — OXYCODONE HCL 5 MG/5ML PO SOLN: 10.0000 mg | Freq: Once | ORAL | Status: DC | PRN

## 2020-12-13 MED ORDER — MIDAZOLAM HCL 5 MG/5ML IJ SOLN
INTRAMUSCULAR | Status: DC | PRN
  Administered 2020-12-13: 2 mg via INTRAVENOUS

## 2020-12-13 MED ORDER — ROCURONIUM BROMIDE 10 MG/ML (PF) SYRINGE
PREFILLED_SYRINGE | INTRAVENOUS | Status: AC
  Filled 2020-12-13: qty 10

## 2020-12-13 MED ORDER — SUGAMMADEX SODIUM 200 MG/2ML IV SOLN
INTRAVENOUS | Status: DC | PRN
  Administered 2020-12-13: 200 mg via INTRAVENOUS

## 2020-12-13 MED ORDER — PROPOFOL 10 MG/ML IV BOLUS
INTRAVENOUS | Status: AC
  Filled 2020-12-13: qty 20

## 2020-12-13 MED ORDER — CEFAZOLIN SODIUM-DEXTROSE 2-4 GM/100ML-% IV SOLN
INTRAVENOUS | Status: AC
  Filled 2020-12-13: qty 100

## 2020-12-13 MED ORDER — EPHEDRINE SULFATE-NACL 50-0.9 MG/10ML-% IV SOSY
PREFILLED_SYRINGE | INTRAVENOUS | Status: DC | PRN
  Administered 2020-12-13 (×5): 5 mg via INTRAVENOUS

## 2020-12-13 MED ORDER — LACTATED RINGERS IV SOLN: INTRAVENOUS | Status: DC

## 2020-12-13 MED ORDER — KETAMINE HCL 10 MG/ML IJ SOLN
INTRAMUSCULAR | Status: DC | PRN
  Administered 2020-12-13 (×7): 10 mg via INTRAVENOUS

## 2020-12-13 MED ORDER — FENTANYL CITRATE (PF) 250 MCG/5ML IJ SOLN
INTRAMUSCULAR | Status: AC
  Filled 2020-12-13: qty 5

## 2020-12-13 MED ORDER — ENSURE PRE-SURGERY PO LIQD: 296.0000 mL | Freq: Once | ORAL | Status: DC

## 2020-12-13 MED ORDER — OXYCODONE HCL 5 MG PO TABS: 5.0000 mg | ORAL_TABLET | Freq: Four times a day (QID) | ORAL | 0 refills | Status: DC | PRN

## 2020-12-13 MED ORDER — ONDANSETRON HCL 4 MG/2ML IJ SOLN
INTRAMUSCULAR | Status: DC | PRN
  Administered 2020-12-13 (×2): 4 mg via INTRAVENOUS

## 2020-12-13 MED ORDER — LIDOCAINE HCL (PF) 2 % IJ SOLN
INTRAMUSCULAR | Status: AC
  Filled 2020-12-13: qty 15

## 2020-12-13 MED ORDER — PROPOFOL 10 MG/ML IV BOLUS
INTRAVENOUS | Status: DC | PRN
  Administered 2020-12-13: 30 mg via INTRAVENOUS
  Administered 2020-12-13: 200 mg via INTRAVENOUS

## 2020-12-13 MED ORDER — CELECOXIB 200 MG PO CAPS
200.0000 mg | ORAL_CAPSULE | ORAL | Status: AC
  Administered 2020-12-13: 200 mg via ORAL

## 2020-12-13 MED ORDER — FENTANYL CITRATE (PF) 100 MCG/2ML IJ SOLN
INTRAMUSCULAR | Status: AC
  Filled 2020-12-13: qty 2

## 2020-12-13 MED ORDER — EPHEDRINE 5 MG/ML INJ
INTRAVENOUS | Status: AC
  Filled 2020-12-13: qty 10

## 2020-12-13 MED ORDER — CHLORHEXIDINE GLUCONATE CLOTH 2 % EX PADS: 6.0000 | MEDICATED_PAD | Freq: Once | CUTANEOUS | Status: DC

## 2020-12-13 MED ORDER — NAPROXEN 500 MG PO TABS
500.0000 mg | ORAL_TABLET | Freq: Two times a day (BID) | ORAL | 1 refills | Status: DC | PRN
Start: 2020-12-13 — End: 2021-09-02

## 2020-12-13 MED ORDER — CEFAZOLIN SODIUM-DEXTROSE 2-4 GM/100ML-% IV SOLN
2.0000 g | INTRAVENOUS | Status: AC
  Administered 2020-12-13: 2 g via INTRAVENOUS

## 2020-12-13 MED ORDER — KETAMINE HCL 10 MG/ML IJ SOLN
INTRAMUSCULAR | Status: AC
  Filled 2020-12-13: qty 1

## 2020-12-13 MED ORDER — BUPIVACAINE-EPINEPHRINE 0.25% -1:200000 IJ SOLN
INTRAMUSCULAR | Status: DC | PRN
  Administered 2020-12-13: 60 mL

## 2020-12-13 MED ORDER — OXYCODONE HCL 5 MG PO TABS: 10.0000 mg | ORAL_TABLET | Freq: Once | ORAL | Status: DC | PRN

## 2020-12-13 MED ORDER — ACETAMINOPHEN 500 MG PO TABS
ORAL_TABLET | ORAL | Status: AC
  Filled 2020-12-13: qty 2

## 2020-12-13 MED ORDER — GABAPENTIN 300 MG PO CAPS
300.0000 mg | ORAL_CAPSULE | ORAL | Status: AC
  Administered 2020-12-13: 300 mg via ORAL

## 2020-12-13 MED ORDER — ACETAMINOPHEN 500 MG PO TABS
1000.0000 mg | ORAL_TABLET | Freq: Once | ORAL | Status: AC
  Administered 2020-12-13: 1000 mg via ORAL

## 2020-12-13 MED ORDER — CELECOXIB 200 MG PO CAPS
ORAL_CAPSULE | ORAL | Status: AC
  Filled 2020-12-13: qty 1

## 2020-12-13 MED ORDER — GABAPENTIN 300 MG PO CAPS
ORAL_CAPSULE | ORAL | Status: AC
  Filled 2020-12-13: qty 1

## 2020-12-13 MED ORDER — ROCURONIUM BROMIDE 100 MG/10ML IV SOLN
INTRAVENOUS | Status: DC | PRN
  Administered 2020-12-13: 10 mg via INTRAVENOUS
  Administered 2020-12-13: 20 mg via INTRAVENOUS
  Administered 2020-12-13: 60 mg via INTRAVENOUS
  Administered 2020-12-13 (×2): 20 mg via INTRAVENOUS

## 2020-12-13 MED ORDER — BUPIVACAINE LIPOSOME 1.3 % IJ SUSP
INTRAMUSCULAR | Status: DC | PRN
  Administered 2020-12-13: 20 mL

## 2020-12-13 MED ORDER — FENTANYL CITRATE (PF) 100 MCG/2ML IJ SOLN
INTRAMUSCULAR | Status: DC | PRN
  Administered 2020-12-13 (×2): 50 ug via INTRAVENOUS
  Administered 2020-12-13: 100 ug via INTRAVENOUS
  Administered 2020-12-13: 50 ug via INTRAVENOUS

## 2020-12-13 MED ORDER — PROMETHAZINE HCL 25 MG/ML IJ SOLN
6.2500 mg | INTRAMUSCULAR | Status: DC | PRN
Start: 2020-12-13 — End: 2020-12-13

## 2020-12-13 SURGICAL SUPPLY — 41 items
APL PRP STRL LF DISP 70% ISPRP (MISCELLANEOUS) ×1
CABLE HIGH FREQUENCY MONO STRZ (ELECTRODE) ×2 IMPLANT
CANISTER SUCT 3000ML PPV (MISCELLANEOUS) IMPLANT
CHLORAPREP W/TINT 26 (MISCELLANEOUS) ×2 IMPLANT
COVER WAND RF STERILE (DRAPES) ×2 IMPLANT
DECANTER SPIKE VIAL GLASS SM (MISCELLANEOUS) ×2 IMPLANT
DEVICE SECURE STRAP 25 ABSORB (INSTRUMENTS) ×2 IMPLANT
DRAPE WARM FLUID 44X44 (DRAPES) ×2 IMPLANT
DRSG TEGADERM 2-3/8X2-3/4 SM (GAUZE/BANDAGES/DRESSINGS) ×2 IMPLANT
DRSG TEGADERM 4X4.75 (GAUZE/BANDAGES/DRESSINGS) ×2 IMPLANT
ELECT REM PT RETURN 9FT ADLT (ELECTROSURGICAL) ×2
ELECTRODE REM PT RTRN 9FT ADLT (ELECTROSURGICAL) ×1 IMPLANT
GLOVE ECLIPSE 8.0 STRL XLNG CF (GLOVE) ×2 IMPLANT
GLOVE INDICATOR 8.0 STRL GRN (GLOVE) ×2 IMPLANT
GOWN STRL REUS W/TWL XL LVL3 (GOWN DISPOSABLE) ×2 IMPLANT
IRRIG SUCT STRYKERFLOW 2 WTIP (MISCELLANEOUS) ×2
IRRIGATION SUCT STRKRFLW 2 WTP (MISCELLANEOUS) ×1 IMPLANT
KIT TURNOVER CYSTO (KITS) ×2 IMPLANT
MANIFOLD NEPTUNE II (INSTRUMENTS) ×2 IMPLANT
MESH HERNIA 6X6 BARD (Mesh General) ×2 IMPLANT
MESH HERNIA BARD 6X6 (Mesh General) ×2 IMPLANT
MESH VENTRALIGHT ST 7X9N (Mesh General) ×2 IMPLANT
NEEDLE HYPO 22GX1.5 SAFETY (NEEDLE) ×2 IMPLANT
NS IRRIG 500ML POUR BTL (IV SOLUTION) ×2 IMPLANT
PACK BASIN DAY SURGERY FS (CUSTOM PROCEDURE TRAY) ×2 IMPLANT
PAD POSITIONING PINK XL (MISCELLANEOUS) ×2 IMPLANT
SCISSORS LAP 5X35 DISP (ENDOMECHANICALS) ×2 IMPLANT
SET TUBE SMOKE EVAC HIGH FLOW (TUBING) ×2 IMPLANT
SLEEVE ADV FIXATION 5X100MM (TROCAR) ×2 IMPLANT
SPONGE GAUZE 2X2 8PLY STRL LF (GAUZE/BANDAGES/DRESSINGS) ×2 IMPLANT
SUT MNCRL AB 4-0 PS2 18 (SUTURE) ×2 IMPLANT
SUT PDS AB 1 CT1 27 (SUTURE) IMPLANT
SUT VIC AB 2-0 SH 27 (SUTURE) ×4
SUT VIC AB 2-0 SH 27XBRD (SUTURE) ×2 IMPLANT
SUT VICRYL 0 UR6 27IN ABS (SUTURE) ×2 IMPLANT
TOWEL OR 17X26 10 PK STRL BLUE (TOWEL DISPOSABLE) ×2 IMPLANT
TRAY DSU PREP LF (CUSTOM PROCEDURE TRAY) ×2 IMPLANT
TRAY LAPAROSCOPIC (CUSTOM PROCEDURE TRAY) ×2 IMPLANT
TROCAR ADV FIXATION 5X100MM (TROCAR) ×2 IMPLANT
TROCAR XCEL BLUNT TIP 100MML (ENDOMECHANICALS) ×2 IMPLANT
WATER STERILE IRR 500ML POUR (IV SOLUTION) ×2 IMPLANT

## 2020-12-13 NOTE — Anesthesia Procedure Notes (Signed)
Procedure Name: Intubation Date/Time: 12/13/2020 10:06 AM Performed by: Marny Lowenstein, CRNA Pre-anesthesia Checklist: Patient identified, Emergency Drugs available, Suction available and Patient being monitored Patient Re-evaluated:Patient Re-evaluated prior to induction Oxygen Delivery Method: Circle system utilized Preoxygenation: Pre-oxygenation with 100% oxygen Induction Type: IV induction Ventilation: Mask ventilation without difficulty Laryngoscope Size: Glidescope and 4 Grade View: Grade I Tube type: Oral Tube size: 7.5 mm Number of attempts: 1 Airway Equipment and Method: Patient positioned with wedge pillow and Rigid stylet Placement Confirmation: ETT inserted through vocal cords under direct vision,  positive ETCO2 and breath sounds checked- equal and bilateral Secured at: 24 cm Tube secured with: Tape Dental Injury: Teeth and Oropharynx as per pre-operative assessment

## 2020-12-13 NOTE — Interval H&P Note (Signed)
History and Physical Interval Note:  12/13/2020 9:30 AM  David [name]  has presented today for surgery, with the diagnosis of LEFT SCROTAL AND RIGHT INGUINAL HERNIA REPAIR. POSSIBLE APPENDECTOMY.  The various methods of treatment have been discussed with the patient and family. After consideration of risks, benefits and other options for treatment, the patient has consented to  Procedure(s) with comments: LAPAROSCOPIC BILATERAL INGUINAL HERNIA REPAIR AND POSSIBLE APPENDECTOMY (Bilateral) - ERAS as a surgical intervention.  The patient's history has been reviewed, patient examined, no change in status, stable for surgery.  I have reviewed the patient's chart and labs.  Questions were answered to the patient's satisfaction.    I have re-reviewed the the patient's records, history, medications, and allergies.  I have re-examined the patient.  I again discussed intraoperative plans and goals of post-operative recovery.  The patient agrees to proceed.  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) 12/01/2016   Myofascial muscle pain 05/14/2015   Cervical postlaminectomy syndrome 01/26/2012   Lumbago 01/26/2012   Chronic postoperative pain 01/26/2012    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    Family history of adverse reaction to anesthesia    father--- ponv   GERD (gastroesophageal reflux disease)    occasional, watches diet    Greater trochanteric bursitis of right hip    History of kidney stones    Hypertension    Inguinal hernia, bilateral    Lumbar spondylosis    Nocturia    Peripheral neuropathy    bilateral lower legs and a little of left arm   Postlaminectomy syndrome, cervical region    Primary localized osteoarthrosis, lower leg    Radiculopathy, multiple sites in spine    cervical , thoracic, lumbar   Type 2 diabetes mellitus (HCC)    followed by pcp---  (12-11-2020 checks blood sugar 3 times wkly,  fasting sugar--- 125 )   Wears glasses     Past Surgical History:  Procedure Laterality Date   ANTERIOR CERVICAL DECOMP/DISCECTOMY FUSION  02-15-2008  @[facility]    C4--5,  C6--7, C7--T1   COLONOSCOPY      Social History   Socioeconomic History   Marital status: Single    Spouse name: Not on file   Number of children: Not on file   Years of education: Not on file   Highest education level: Not on file  Occupational History   Not on file  Tobacco Use   Smoking status: Never Smoker   Smokeless tobacco: Never Used  Vaping Use   Vaping Use: Never used  Substance and Sexual Activity   Alcohol use: No   Drug use: Never   Sexual activity: Not on file  Other Topics Concern   Not on file  Social History Narrative   Not on file   Social Determinants of Health   Financial Resource Strain: Not on file  Food Insecurity: Not on file  Transportation Needs: Not  on file  Physical Activity: Not on file  Stress: Not on file  Social Connections: Not on file  Intimate Partner Violence: Not on file    Family History  Problem Relation Age of Onset   Arthritis Mother    Heart disease Mother    Diabetes Mother    Heart disease Father    Diabetes Father     Medications Prior to Admission  Medication Sig Dispense Refill Last Dose   Cholecalciferol (VITAMIN D3) 125 MCG (5000 UT) CHEW Chew by mouth daily.   12/12/2020 at Unknown time   diazepam (VALIUM) 5 MG tablet Take 1 tablet by mouth 2 (two) times  daily as needed.  0 12/12/2020 at Unknown time   FIBER ADULT GUMMIES PO Take by mouth daily.   12/12/2020 at Unknown time   gabapentin (NEURONTIN) 100 MG capsule TAKE 1 CAPSULE BY MOUTH 4 TIMES A DAY WITH 400MG  TABLET DAILY (Patient taking differently: Take 100 mg by mouth 4 (four) times daily. TAKE 1 CAPSULE BY MOUTH 4 TIMES A DAY WITH 400MG  TABLET DAILY  Per pt takes at lunch and supper) 360 capsule 1 12/12/2020 at Unknown time   gabapentin (NEURONTIN) 400 MG capsule TAKE 1 CAPSULE BY MOUTH FOUR TIMES A DAY (Patient taking differently: Take 400 mg by mouth 4 (four) times daily. TAKE 1 CAPSULE BY MOUTH FOUR TIMES A DAY---  takes w/ the 100 mg to = 500 mg QID) 360 capsule 1 12/12/2020 at Unknown time   lisinopril-hydrochlorothiazide (PRINZIDE,ZESTORETIC) 20-25 MG per tablet Take 1 tablet by mouth at bedtime.   12/12/2020 at Unknown time   metFORMIN (GLUCOPHAGE) 500 MG tablet Take 1,000 mg by mouth 2 (two) times daily.  1 12/12/2020 at Unknown time   Multiple Vitamin (MULTIVITAMIN) tablet Take 1 tablet by mouth daily. gummy   12/12/2020 at Unknown time   oxyCODONE ER (XTAMPZA ER) 18 MG C12A Take 1 capsule by mouth every 12 (twelve) hours. 60 capsule 0 12/13/2020 at 0630   oxyCODONE-acetaminophen (PERCOCET/ROXICET) 5-325 MG tablet Take 1 tablet by mouth 2 (two) times daily. May Take a extra tablet when pain is sever. 75 tablet 0 12/12/2020 at Unknown time   pravastatin (PRAVACHOL) 40 MG tablet Take 40 mg by mouth at bedtime.   12/12/2020 at Unknown time   diclofenac sodium (VOLTAREN) 1 % GEL Apply 2 g topically 3 (three) times daily. Apply three times a day (Patient taking differently: Apply 2 g topically 3 (three) times daily as needed. Apply three times a day) 3 Tube 4 More than a month at Unknown time   FLECTOR 1.3 % PTCH PLACE 1 PATCH ONTO THE SKIN DAILY. (Patient not taking: No sig reported) 30 patch 2 Unknown at Unknown time    Current Facility-Administered Medications  Medication Dose Route Frequency  Provider Last Rate Last Admin   acetaminophen (TYLENOL) tablet 1,000 mg  1,000 mg Oral On Call to OR 02/09/2021, MD       bupivacaine liposome (EXPAREL) 1.3 % injection 266 mg  20 mL Infiltration Once 02/09/2021, MD       ceFAZolin (ANCEF) IVPB 2g/100 mL premix  2 g Intravenous On Call to OR [name], MD       Chlorhexidine Gluconate Cloth 2 % PADS 6 each  6 each Topical Once [name], MD       And   Chlorhexidine Gluconate Cloth 2 % PADS 6 each  6 each Topical Once [name], MD       [  START ON 12/14/2020] feeding supplement (ENSURE PRE-SURGERY) liquid 296 mL  296 mL Oral Once [name], MD       lactated ringers infusion   Intravenous Continuous Jairo Ben, MD         Allergies  Allergen Reactions   Prednisone     Allergy to all steroids- flushing, itching, dizziness    BP 128/80   Pulse (!) 103   Temp 97.8 F (36.6 C) (Oral)   Resp 16   Ht 6\' 3"  (1.905 m)   Wt 99.2 kg   SpO2 100%   BMI 27.34 kg/m   Labs: No results found for this or any previous visit (from the past 48 hour(s)).  Imaging / Studies: No results found.   , M.D., F.A.C.S. Gastrointestinal and Minimally Invasive Surgery [facility] [address], [address] [place], [place] [phone] Main / Paging  12/13/2020 9:30 AM    02/10/2021

## 2020-12-13 NOTE — Op Note (Signed)
12/13/2020  1:14 PM  PATIENT:  David [name]  45 y.o. male  Patient Care Team: Dema Severin, NP as PCP - Erline Hau, MD as Consulting Physician (General Surgery) Bjorn Pippin, MD as Attending Physician (Urology)  PRE-OPERATIVE DIAGNOSIS:   LEFT SCROTAL INGUINAL HERNIA RIGHT INGUINAL HERNIA  POSSIBLE APPENDICEAL MASS  POST-OPERATIVE DIAGNOSIS:   LEFT SCROTAL INGUINAL HERNIA RIGHT INGUINAL HERNIA   PROCEDURE:   LAPAROSCOPIC LEFT INGUINAL SCROTAL HERNIA REPAIR WITH MESH LAPAROSCOPIC RIGHT HERNIA REPAIR WITH MESH DIAGNOSTIC LAPAROSCOPY TAP BLOCK - BILATERAL  SURGEON:  Ardeth Sportsman, MD  ASSISTANT: None  ANESTHESIA:     Regional ilioinguinal and genitofemoral and spermatic cord nerve blocks  General  Nerve block provided with liposomal bupivacaine (Experel) mixed with 0.25% bupivacaine as a Bilateral TAP block x 40mL each side at the level of the transverse abdominis & preperitoneal spaces along the flank at the anterior axillary line, from subcostal ridge to iliac crest under laparoscopic guidance    EBL:  Total I/O In: 1100 [I.V.:1000; IV Piggyback:100] Out: 25 [Blood:25].  See anesthesia record  Delay start of Pharmacological VTE agent (>24hrs) due to surgical blood loss or risk of bleeding:  no  DRAINS: NONE  SPECIMEN:  HERNIA SAC (LEFT INGUINAL/SCROTAL)  DISPOSITION OF SPECIMEN: PATHOLOGY COUNTS:  YES  PLAN OF CARE: Discharge to home after PACU  PATIENT DISPOSITION:  PACU - hemodynamically stable.  INDICATION:  Patient with worsening scrotal swelling & giant inguinal hernia.  Recommendation made for laparoscopic exploration & repairs of hernias.  The anatomy & physiology of the abdominal wall and pelvic floor was discussed.  The pathophysiology of hernias in the inguinal and pelvic region was discussed.  Natural history risks such as progressive enlargement, pain, incarceration & strangulation was discussed.   Contributors to complications  such as smoking, obesity, diabetes, prior surgery, etc were discussed.    I feel the risks of no intervention will lead to serious problems that outweigh the operative risks; therefore, I recommended surgery to reduce and repair the hernia.  I explained laparoscopic techniques with possible need for an open approach.  I noted usual use of mesh to patch and/or buttress hernia repair  Risks such as bleeding, infection, abscess, need for further treatment, heart attack, death, and other risks were discussed.  I noted a good likelihood this will help address the problem.   Goals of post-operative recovery were discussed as well.  Possibility that this will not correct all symptoms was explained.  I stressed the importance of low-impact activity, aggressive pain control, avoiding constipation, & not pushing through pain to minimize risk of post-operative chronic pain or injury. Possibility of reherniation was discussed.  We will work to minimize complications.     An educational handout further explaining the pathology & treatment options was given as well.  Questions were answered.  The patient expresses understanding & wishes to proceed with surgery.  Concern for appendiceal wall changes on CT scan - offered Dx laparoscopy & possible appendectomy if needed.  The anatomy & physiology of the digestive tract was discussed.  The pathophysiology of appendicitis and other appendiceal disorders were discussed.  Natural history risks without surgery was discussed.   I feel the risks of no intervention will lead to serious problems that outweigh the operative risks; therefore, I recommended diagnostic laparoscopy with removal of appendix to remove the pathology.  Laparoscopic & open techniques were discussed.   I noted a good likelihood this will help address the problem.  Risks such as bleeding, infection, abscess, leak, reoperation, injury to other organs, need for repair of tissues / organs, possible ostomy,  hernia, heart attack, stroke, death, and other risks were discussed.  Goals of post-operative recovery were discussed as well.  We will work to minimize complications.  Questions were answered.  The patient expresses understanding & wishes to proceed with surgery.   OR FINDINGS: Giant indirect left inguinal hernia large volumes of small bowel and intestine.  Eventually reducible.  No direct space femoral artery hernias.  Excess hernia sac excised.  Small direct/indirect pantaloon type hernia on the right side.  No femoral or inguinal hernias.  No intra-abdominal pathology.  Appendix normal in appearance without any serosal changes.  No nodularity or lymphadenopathy the ileocecal and appendiceal region.  No suspicion for carcinomatosis or carcinoid tumor or other abnormalities.  Appendix left in place.  DESCRIPTION:  The patient was identified & brought into the operating room. The patient was positioned supine with arms tucked. SCDs were active during the entire case. The patient underwent general anesthesia without any difficulty.  The abdomen was prepped and draped in a sterile fashion.  Once he was asleep was able to partially reduce the giant inguinal hernia about the size of a volleyball down to something more the size of a grapefruit.  The patient's bladder was emptied.  A Surgical Timeout confirmed our plan.  I made a transverse incision through the inferior umbilical fold.  I made a small transverse nick through the anterior rectus fascia contralateral to the inguinal hernia side and placed a 0-vicryl stitch through the fascia.  I placed a Hasson trocar into the preperitoneal plane.  Entry was clean.  We induced carbon dioxide insufflation. Camera inspection revealed no injury.  I used a 39mm angled scope to bluntly free the peritoneum off the infraumbilical anterior abdominal wall.  I created enough of a preperitoneal pocket to place 75mm ports into the right & left mid-abdomen into this  preperitoneal cavity.  I focused attention on the LEFT pelvis since that was the dominant hernia side.   I used blunt & focused sharp dissection to free the peritoneum off the flank and down to the pubic rim.  I freed the anteriolateral bladder wall off the anteriolateral pelvic wall, sparing midline attachments.   I located a swath of peritoneum going into a hernia fascial defect at the  internal ring consistent with  an indirect inguinal hernia..  I gradually freed the peritoneal hernia sac off safely and reduced it into the preperitoneal space.  Initially had to open up the hernia sac at the peritoneum to better find the anatomy.  Gradually freed off the thickened hernia sac off the spermatic vessels.  The vas deferens and spermatic vessels were not involved and freed the hernia sac off the scrotum and inguinal canal and reduced it down.  Did leave a cuff closer to the epididymis and testes which were still adherent to the scrotum.  Eventually came around and freed the peritoneum off the spermatic vessels & vas deferens.  I freed peritoneum off the retroperitoneum along the psoas muscle.  I excised redundant hernia sac with scissors.  Spermatic cord lipoma was dissected away & removed.  I checked & assured hemostasis.    I used this peritoneal opening to explore the intraperitoneal cavity.  Sigmoid colon showing some thickening consistent with prior incarceration but no evidence of perforation, diverticulitis, or abnormality.  Small bowel without adhesions or ischemia or obstruction.  If on  the appendix which had no adhesions.  No inflammation or thickening.  No nodularity.  No ileocecal lymphadenopathy.  No Crohn's disease.  Was completely normal.  I do not feel that appendectomy was warranted.    Plan for closed the peritoneal defect and hernia sac with 2-0 Vicryl in a running interrupted fashion using laparoscopic intracorporeal suturing with good results.  This has peritoneum in a much more normal  range.  I turned attention on the opposite  RIGHT pelvis.  I did dissection in a similar, mirror-image fashion. The patient had an indirect inguinal hernia.. Small but definite direct space hernia as well.  Consistent with a pantaloon direct/indirect inguinal hernia.  Spermatic cord lipoma was dissected away & removed.    I checked & assured hemostasis.     I chose 15x15 cm sheets of Bard Marlestandard weight polypropylene mesh for the right each side.  I cut a single sigmoid-shaped slit ~6cm from a corner of each mesh.  I placed the meshes into the preperitoneal space & laid them as overlapping diamonds such that at the inferior points, a 6x6 cm corner flap rested in the true anterolateral pelvis, covering the obturator & femoral foramina.   I allowed the bladder to return to the pubis, this helping tuck the corners of the mesh in the anteriolateral pelvis.  The fact that he had a giant indirect inguinal hernia defect I used a 17 x 23 cm dual sided mesh.  I rolled it in transversely inflated so that two thirds for lateral to the indirect inguinal hernia going towards the left flank.  I cut a slit in the medial inferior border of the mesh and the 2/3/1/3 junction.  Allow this medial inferior flap to talk around more the bladder was.  He is tacker secure strap absorbable tacks to tack the meshes up.  The medial corners overlapped each other across midline cephalad to the pubic rim.   Given the numerous hernias of moderate size, I placed a third 15x15cm mesh in the center as a vertical diamond.  The lateral wings of the mesh overlap across the direct spaces and internal rings where the dominant hernias were.  This provided good coverage and reinforcement of the hernia repairs.  Given his numerous hernias and large defect I did use a transfascial sutures on the superior edge of the left and central meshes using #1 Prolene laparoscopic suture passer 4 interrupted sutures to have some good transfer initial securing  of the mesh given his diet hernia defect.  Did laparoscopic irrigation and reinspection.  No peritoneal defect.  Mesh laid well.  Good overlap.  Hemostasis was good.  I held the mesh cephalad & aspirated evacuated carbon dioxide.  I closed the fascia with absorbable suture.  I peritoneal defect using 2-0 Vicryl in a running corrected fashion skin using 4-0 monocryl stitch.  Sterile dressings were applied.   The patient was extubated & arrived in the PACU in stable condition..  I had discussed postoperative care with the patient in the holding area.  Instructions are written in the chart.  I discussed operative findings, updated the patient's status, discussed probable steps to recovery, and gave postoperative recommendations to the patient's family, (David [name] = mother).  Recommendations were made.  Questions were answered.  She expressed understanding & appreciation.   Ardeth Sportsman, M.D., F.A.C.S. Gastrointestinal and Minimally Invasive Surgery [facility] [address], [address] [place], [place] [address] [phone] Main / Paging  12/13/2020 1:14 PM

## 2020-12-13 NOTE — H&P (Signed)
David "David [name]" Cedric [name] DOB: 03-21-1976   Patient Care Team: Dema Severin, NP as PCP - Erline Hau, MD as Consulting Physician (General Surgery) Bjorn Pippin, MD as Attending Physician (Urology)  ` ` Patient sent for surgical consultation at the request of Bjorn Pippin, MD  Chief Complaint: Large left scrotal mass. Probable hernia ` ` The patient is a gentleman who felt a pulling in discomfort and swelling in his left groin a few years ago. He has gotten larger. He was trying to hold off on doing anything with the Covid pandemic but now has become much larger than this past year. Based concerns she discussed with urology. Inguinal hernia suspected. CAT scan suspicious for a large left inguinal hernia containing small bowel. Probable small right a hernia as well. Surgical consultation requested. Patient does not smoke. Moves his bowels every day. He walk several miles a day. 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 Recently diagnosed with diabetes stable on metformin. No sleep apnea. Never had any abdominal hernia surgery.  Of note with a CT scan there is concerned perhaps a mass on the appendix tip. Rather subtle. Patient denies any pain or history of appendicitis. No history of Crohn's  No personal nor family history of GI/colon cancer, inflammatory bowel disease, irritable bowel syndrome, allergy such as Celiac Sprue, dietary/dairy problems, colitis, ulcers nor gastritis. No recent sick contacts/gastroenteritis. No travel outside the country. No changes in diet. No dysphagia to solids or liquids. No significant heartburn or reflux. No melena, hematemesis, coffee ground emesis. No evidence of prior gastric/peptic ulceration.  (Review of systems as stated in this history (HPI) or in the review of systems. Otherwise all other 12 point ROS are negative) ` ` ###########################################`  This  patient encounter took 30 minutes today to perform the following: obtain history, perform exam, review outside records, interpret tests & imaging, counsel the patient on their diagnosis; and, document this encounter, including findings & plan in the electronic health record (EHR).   Past Surgical History Mertha Finders, CMA; 11/11/2020 8:40 AM) Spinal Surgery - Neck  Diagnostic Studies History Zigmund Gottron Marshall-McBride, CMA; 11/11/2020 8:40 AM) Colonoscopy >10 years ago  Allergies (Kheana Marshall-McBride, CMA; 11/11/2020 8:41 AM) Corticosteroids Depletion *DIETARY PRODUCTS/DIETARY MANAGEMENT PRODUCTS* Allergies Reconciled  Medication History (Kheana Marshall-McBride, CMA; 11/11/2020 8:42 AM) oxyCODONE-Acetaminophen (5-325MG  Tablet, Oral) Active. Xtampza ER (18MG  Cap 12HR Deter, Oral) Active. diazePAM (5MG  Tablet, Oral) Active. Gabapentin (400MG  Capsule, Oral) Active. Lisinopril-hydroCHLOROthiazide (20-25MG  Tablet, Oral) Active. metFORMIN HCl (500MG  Tablet, Oral) Active. Pravastatin Sodium (40MG  Tablet, Oral) Active. Medications Reconciled  Social History Marshall-McBride, CMA; 11/11/2020 8:40 AM) Caffeine use Carbonated beverages. No alcohol use No drug use Tobacco use Never smoker.  Family History , CMA; 11/11/2020 8:40 AM) Anesthetic complications Father. Arthritis Brother, Father, Mother. Depression Father, Mother. Heart Disease Mother. Hypertension Father, Mother. Prostate Cancer Father.  Other Problems Marshall-McBride, CMA; 11/11/2020 8:40 AM) Anxiety Disorder Arthritis Back Pain Depression Diabetes Mellitus Gastroesophageal Reflux Disease High blood pressure Hypercholesterolemia Inguinal Hernia Kidney Stone     Review of Systems 01/09/2021 Marshall-McBride CMA; 11/11/2020 8:40 AM) General Not Present- Appetite Loss, Chills, Fatigue, Fever, Night Sweats, Weight Gain and Weight  Loss. Skin Not Present- Change in Wart/Mole, Dryness, Hives, Jaundice, New Lesions, Non-Healing Wounds, Rash and Ulcer. HEENT Present- Ringing in the Ears, Seasonal Allergies and Wears glasses/contact lenses. Not Present- Earache, Hearing Loss, Hoarseness, Nose Bleed, Oral Ulcers, Sinus Pain, Sore Throat, Visual Disturbances and Yellow Eyes.  Breast Not Present- Breast Mass, Breast Pain, Nipple Discharge and Skin Changes. Cardiovascular Present- Leg Cramps. Not Present- Chest Pain, Difficulty Breathing Lying Down, Palpitations, Rapid Heart Rate, Shortness of Breath and Swelling of Extremities. Gastrointestinal Present- Bloating. Not Present- Abdominal Pain, Bloody Stool, Change in Bowel Habits, Chronic diarrhea, Constipation, Difficulty Swallowing, Excessive gas, Gets full quickly at meals, Hemorrhoids, Indigestion, Nausea, Rectal Pain and Vomiting. Male Genitourinary Not Present- Blood in Urine, Change in Urinary Stream, Frequency, Impotence, Nocturia, Painful Urination, Urgency and Urine Leakage. Musculoskeletal Present- Back Pain, Joint Pain, Joint Stiffness and Muscle Pain. Not Present- Muscle Weakness and Swelling of Extremities. Neurological Present- Numbness and Tingling. Not Present- Decreased Memory, Fainting, Headaches, Seizures, Tremor, Trouble walking and Weakness. Psychiatric Present- Anxiety and Depression. Not Present- Bipolar, Change in Sleep Pattern, Fearful and Frequent crying. Endocrine Not Present- Cold Intolerance, Excessive Hunger, Hair Changes, Heat Intolerance, Hot flashes and New Diabetes. Hematology Not Present- Blood Thinners, Easy Bruising, Excessive bleeding, Gland problems, HIV and Persistent Infections.  Vitals (Kheana Marshall-McBride CMA; 11/11/2020 8:42 AM) 11/11/2020 8:42 AM Weight: 225.13 lb Height: 71in Body Surface Area: 2.22 m Body Mass Index: 31.4 kg/m  Temp.: 98.30F  Pulse: 103 (Regular)  P.OX: 98% (Room air) BP: 136/84(Sitting, Left Arm,  Standard)   BP 128/80   Pulse (!) 103   Temp 97.8 F (36.6 C) (Oral)   Resp 16   Ht 6\' 3"  (1.905 m)   Wt 99.2 kg   SpO2 100%   BMI 27.34 kg/m  12/13/2020     Physical Exam Ardeth Sportsman MD; 11/11/2020 9:01 AM)  General Mental Status-Alert. General Appearance-Not in acute distress, Not Sickly. Orientation-Oriented X3. Hydration-Well hydrated. Voice-Normal.  Integumentary Global Assessment Upon inspection and palpation of skin surfaces of the - Axillae: non-tender, no inflammation or ulceration, no drainage. and Distribution of scalp and body hair is normal. General Characteristics Temperature - normal warmth is noted.  Head and Neck Head-normocephalic, atraumatic with no lesions or palpable masses. Face Global Assessment - atraumatic, no absence of expression. Neck Global Assessment - no abnormal movements, no bruit auscultated on the right, no bruit auscultated on the left, no decreased range of motion, non-tender. Trachea-midline. Thyroid Gland Characteristics - non-tender.  Eye Eyeball - Left-Extraocular movements intact, No Nystagmus - Left. Eyeball - Right-Extraocular movements intact, No Nystagmus - Right. Cornea - Left-No Hazy - Left. Cornea - Right-No Hazy - Right. Sclera/Conjunctiva - Left-No scleral icterus, No Discharge - Left. Sclera/Conjunctiva - Right-No scleral icterus, No Discharge - Right. Pupil - Left-Direct reaction to light normal. Pupil - Right-Direct reaction to light normal. Note: Wears glasses. Vision corrected  ENMT Ears Pinna - Left - no drainage observed, no generalized tenderness observed. Pinna - Right - no drainage observed, no generalized tenderness observed. Nose and Sinuses External Inspection of the Nose - no destructive lesion observed. Inspection of the nares - Left - quiet respiration. Inspection of the nares - Right - quiet respiration. Mouth and Throat Lips - Upper Lip - no  fissures observed, no pallor noted. Lower Lip - no fissures observed, no pallor noted. Nasopharynx - no discharge present. Oral Cavity/Oropharynx - Tongue - no dryness observed. Oral Mucosa - no cyanosis observed. Hypopharynx - no evidence of airway distress observed.  Chest and Lung Exam Inspection Movements - Normal and Symmetrical. Accessory muscles - No use of accessory muscles in breathing. Palpation Palpation of the chest reveals - Non-tender. Auscultation Breath sounds - Normal and Clear.  Cardiovascular Auscultation Rhythm - Regular. Murmurs & Other Heart  Sounds - Auscultation of the heart reveals - No Murmurs and No Systolic Clicks.  Abdomen Inspection Inspection of the abdomen reveals - No Visible peristalsis and No Abnormal pulsations. Umbilicus - No Bleeding, No Urine drainage. Palpation/Percussion Palpation and Percussion of the abdomen reveal - Soft, Non Tender, No Rebound tenderness, No Rigidity (guarding) and No Cutaneous hyperesthesia. Note: Abdomen soft. Nontender. Not distended. No umbilical or incisional hernias. No guarding.  Male Genitourinary Sexual Maturity Tanner 5 - Adult hair pattern and Adult penile size and shape. Note: Very large left scrotal soft mass about the size of a volleyball. Consistent with very large left inguinal hernia. Small right-sided inguinal hernia as well.  Peripheral Vascular Upper Extremity Inspection - Left - No Cyanotic nailbeds - Left, Not Ischemic. Inspection - Right - No Cyanotic nailbeds - Right, Not Ischemic.  Neurologic Neurologic evaluation reveals -normal attention span and ability to concentrate, able to name objects and repeat phrases. Appropriate fund of knowledge , normal sensation and normal coordination. Mental Status Affect - not angry, not paranoid. Cranial Nerves-Normal Bilaterally. Gait-Normal.  Neuropsychiatric Mental status exam performed with findings of-able to articulate well with  normal speech/language, rate, volume and coherence, thought content normal with ability to perform basic computations and apply abstract reasoning and no evidence of hallucinations, delusions, obsessions or homicidal/suicidal ideation.  Musculoskeletal Global Assessment Spine, Ribs and Pelvis - no instability, subluxation or laxity. Right Upper Extremity - no instability, subluxation or laxity.  Lymphatic Head & Neck  General Head & Neck Lymphatics: Bilateral - Description - No Localized lymphadenopathy. Axillary  General Axillary Region: Bilateral - Description - No Localized lymphadenopathy. Femoral & Inguinal  Generalized Femoral & Inguinal Lymphatics: Left - Description - No Localized lymphadenopathy. Right - Description - No Localized lymphadenopathy.    Assessment & Plan   SCROTAL HERNIA (K40.90) Impression: Very large left scrotal hernia with small bowel within it. The size of a volleyball.  He would benefit from surgery for reduction and repair. We'll try laparoscopic preperitoneal approach. Most likely will have to be a modified TAPP approach with larger sheets of mesh. Defect contralateral right side as well. He is at high risk for major hematoma. Outpatient setting. I do not think this can wait several months. He is otherwise in decent physical condition to tolerate surgery   RIGHT INGUINAL HERNIA (K40.90) Impression: Small but definitely will hernia by exam and CT scan. Would plan mesh repair at the same time.   PREOP - ING HERNIA - ENCOUNTER FOR PREOPERATIVE EXAMINATION FOR GENERAL SURGICAL PROCEDURE (Z01.818)  Current Plans You are being scheduled for surgery- Our schedulers will call you.  You should hear from our office's scheduling department within 5 working days about the location, date, and time of surgery. We try to make accommodations for patient's preferences in scheduling surgery, but sometimes the OR schedule or the surgeon's schedule  prevents [place] from making those accommodations.  If you have not heard from our office [phone]) in 5 working days, call the office and ask for your surgeon's nurse.  If you have other questions about your diagnosis, plan, or surgery, call the office and ask for your surgeon's nurse.  Written instructions provided The anatomy & physiology of the abdominal wall and pelvic floor was discussed. The pathophysiology of hernias in the inguinal and pelvic region was discussed. Natural history risks such as progressive enlargement, pain, incarceration, and strangulation was discussed. Contributors to complications such as smoking, obesity, diabetes, prior surgery, etc were discussed.  I feel the  risks of no intervention will lead to serious problems that outweigh the operative risks; therefore, I recommended surgery to reduce and repair the hernia. I explained laparoscopic techniques with possible need for an open approach. I noted usual use of mesh to patch and/or buttress hernia repair  Risks such as bleeding, infection, abscess, need for further treatment, heart attack, death, and other risks were discussed. I noted a good likelihood this will help address the problem. Goals of post-operative recovery were discussed as well. Possibility that this will not correct all symptoms was explained. I stressed the importance of low-impact activity, aggressive pain control, avoiding constipation, & not pushing through pain to minimize risk of post-operative chronic pain or injury. Possibility of reherniation was discussed. We will work to minimize complications.  An educational handout further explaining the pathology & treatment options was given as well. Questions were answered. The patient expresses understanding & wishes to proceed with surgery.  Pt Education - Pamphlet Given - Laparoscopic Hernia Repair: discussed with patient and provided information. Pt Education - [facility] Pain Control  (Saavi Mceachron) Pt Education - [facility] Hernia Post-Op HCI (Nickey Canedo): discussed with patient and provided information. Pt Education - [facility] Mesh education: discussed with patient and provided information.  MASS OF APPENDIX (K38.8) Impression: Possible mass on appendix. Seems subtle to my reading of the CAT scan. He has no GI complaints. Will evaluate with laparoscopy. If truly abnormal or concerning, may do appendectomy first & delay hernia surgery 6-12 weeks later.. Otherwise tried hold off and fix the hernia since that seems to be more pressing issue.  Current Plans The anatomy & physiology of the digestive tract was discussed. The pathophysiology of intestinal obstruction was discussed. Natural history risks without surgery was discussed. Differential diagnosis discuseed. I feel the patient has failed non-operative therapies. The risks of no intervention will lead to serious problems such as necrosis, perforation, dehydration, etc. that outweigh the operative risks; therefore, I recommended abdominal exploration to diagnose & treat the source of the problem. Minimally Invasive & open techniques were discussed. I expressed a good likelihood that surgery will treat the problem.  Risks such as bleeding, infection, abscess, leak, reoperation, bowel resection, possible ostomy, hernia, heart attack, death, and other risks were discussed. I noted a good likelihood this will help address the problem. Goals of post-operative recovery were discussed as well. We will work to minimize complications. Questions were answered. The patient expresses understanding & wishes to proceed with surgery.  Ardeth Sportsman, MD, FACS, MASCRS Gastrointestinal and Minimally Invasive Surgery  [facility] [address] [place], [place] [address] [phone] Fax [phone] Main/Paging  CONTACT INFORMATION: Weekday (9AM-5PM) concerns: Call [facility] main office at [phone] Weeknight  (5PM-9AM) or Weekend/Holiday concerns: Check [email or web address] for General Surgery [facility] coverage (Please, do not use SecureChat as it is not reliable communication to operating surgeons for immediate patient care)

## 2020-12-13 NOTE — Transfer of Care (Signed)
Immediate Anesthesia Transfer of Care Note  Patient: Emmett Bracknell  Procedure(s) Performed: LAPAROSCOPIC BILATERAL INGUINAL HERNIA REPAIR AND POSSIBLE APPENDECTOMY (Bilateral )  Patient Location: PACU  Anesthesia Type:General  Level of Consciousness: awake, alert  and patient cooperative  Airway & Oxygen Therapy: Patient Spontanous Breathing and Patient connected to nasal cannula oxygen  Post-op Assessment: Report given to RN and Post -op Vital signs reviewed and stable  Post vital signs: Reviewed and stable  Last Vitals:  Vitals Value Taken Time  BP 119/63 12/13/20 1330  Temp 37.1 C 12/13/20 1324  Pulse 105 12/13/20 1331  Resp 13 12/13/20 1331  SpO2 96 % 12/13/20 1331  Vitals shown include unvalidated device data.  Last Pain:  Vitals:   12/13/20 0842  TempSrc: Oral  PainSc: 5       Patients Stated Pain Goal: 4 (12/13/20 6811)  Complications: No complications documented.

## 2020-12-13 NOTE — Discharge Instructions (Signed)
HERNIA REPAIR: POST OP INSTRUCTIONS  ######################################################################  EAT Gradually transition to a high fiber diet with a fiber supplement over the next few weeks after discharge.  Start with a pureed / full liquid diet (see below)  WALK Walk an hour a day.  Control your pain to do that.    CONTROL PAIN Control pain so that you can walk, sleep, tolerate sneezing/coughing, and go up/down stairs.  HAVE A BOWEL MOVEMENT DAILY Keep your bowels regular to avoid problems.  OK to try a laxative to override constipation.  OK to use an antidairrheal to slow down diarrhea.  Call if not better after 2 tries  CALL IF YOU HAVE PROBLEMS/CONCERNS Call if you are still struggling despite following these instructions. Call if you have concerns not answered by these instructions  ######################################################################    1. DIET: Follow a light bland diet & liquids the first 24 hours after arrival home, such as soup, liquids, starches, etc.  Be sure to drink plenty of fluids.  Quickly advance to a usual solid diet within a few days.  Avoid fast food or heavy meals as your are more likely to get nauseated or have irregular bowels.  A low-fat, high-fiber diet for the rest of your life is ideal.   2. Take your usually prescribed home medications unless otherwise directed.  3. PAIN CONTROL: a. Pain is best controlled by a usual combination of three different methods TOGETHER: i. Ice/Heat ii. Over the counter pain medication iii. Prescription pain medication b. Most patients will experience some swelling and bruising around the hernia(s) such as the bellybutton, groins, or old incisions.  Ice packs or heating pads (30-60 minutes up to 6 times a day) will help. Use ice for the first few days to help decrease swelling and bruising, then switch to heat to help relax tight/sore spots and speed recovery.  Some people prefer to use ice  alone, heat alone, alternating between ice & heat.  Experiment to what works for you.  Swelling and bruising can take several weeks to resolve.   c. It is helpful to take an over-the-counter pain medication regularly for the first few weeks.  Choose one of the following that works best for you: i. Naproxen (Aleve, etc)  Two 244m tabs twice a day ii. Ibuprofen (Advil, etc) Three 2036mtabs four times a day (every meal & bedtime) iii. Acetaminophen (Tylenol, etc) 325-65016mour times a day (every meal & bedtime) d. A  prescription for pain medication should be given to you upon discharge.  Take your pain medication as prescribed.  i. If you are having problems/concerns with the prescription medicine (does not control pain, nausea, vomiting, rash, itching, etc), please call us [phone] see if we need to switch you to a different pain medicine that will work better for you and/or control your side effect better. ii. If you need a refill on your pain medication, please contact your pharmacy.  They will contact our office to request authorization. Prescriptions will not be filled after 5 pm or on week-ends.  4. Avoid getting constipated.  Between the surgery and the pain medications, it is common to experience some constipation.  Increasing fluid intake and taking a fiber supplement (such as Metamucil, Citrucel, FiberCon, MiraLax, etc) 1-2 times a day regularly will usually help prevent this problem from occurring.  A mild laxative (prune juice, Milk of Magnesia, MiraLax, etc) should be taken according to package directions if there are no bowel movements after 48  hours.    5. Wash / shower every day.  You may shower over the dressings as they are waterproof.    6. Remove your waterproof bandages, skin tapes, and other bandages 3 days after surgery. You may replace a dressing/Band-Aid to cover the incision for comfort if you wish. You may leave the incisions open to air.  You may replace a  dressing/Band-Aid to cover an incision for comfort if you wish.  Continue to shower over incision(s) after the dressing is off.  7. ACTIVITIES as tolerated:   a. You may resume regular (light) daily activities beginning the next day--such as daily self-care, walking, climbing stairs--gradually increasing activities as tolerated.  Control your pain so that you can walk an hour a day.  If you can walk 30 minutes without difficulty, it is safe to try more intense activity such as jogging, treadmill, bicycling, low-impact aerobics, swimming, etc. b. Save the most intensive and strenuous activity for last such as sit-ups, heavy lifting, contact sports, etc  Refrain from any heavy lifting or straining until you are off narcotics for pain control.   c. DO NOT PUSH THROUGH PAIN.  Let pain be your guide: If it hurts to do something, don't do it.  Pain is your body warning you to avoid that activity for another week until the pain goes down. d. You may drive when you are no longer taking prescription pain medication, you can comfortably wear a seatbelt, and you can safely maneuver your car and apply brakes. e. Bonita Quin may have sexual intercourse when it is comfortable.   8. FOLLOW UP in our office a. Please call [facility] at [phone] to set up an appointment to see your surgeon in the office for a follow-up appointment approximately 2-3 weeks after your surgery. b. Make sure that you call for this appointment the day you arrive home to insure a convenient appointment time.  9.  If you have disability of FMLA / Family leave forms, please bring the forms to the office for processing.  (do not give to your surgeon).  WHEN TO CALL us [phone]: 1. Poor pain control 2. Reactions / problems with new medications (rash/itching, nausea, etc)  3. Fever over 101.5 F (38.5 C) 4. Inability to urinate 5. Nausea and/or vomiting 6. Worsening swelling or bruising 7. Continued bleeding from incision. 8. Increased pain,  redness, or drainage from the incision   The clinic staff is available to answer your questions during regular business hours (8:30am-5pm).  Please don't hesitate to call and ask to speak to one of our nurses for clinical concerns.   If you have a medical emergency, go to the nearest emergency room or call 911.  A surgeon from [facility] is always on call at the hospitals in [facility], [place] [address], [address], [place], [place]  [address] ?  [address] [address], [place], [place]   [address] MAIN: [phone] ? TOLL FREE: [phone] ? FAX: [phone] [email or web address]      Post Anesthesia Home Care Instructions  Activity: Get plenty of rest for the remainder of the day. A responsible individual must stay with you for 24 hours following the procedure.  For the next 24 hours, DO NOT: -Drive a car -Advertising copywriter -Drink alcoholic beverages -Take any medication unless instructed by your physician -Make any legal decisions or sign important papers.  Meals: Start with liquid foods such as gelatin or soup. Progress to regular foods as  tolerated. Avoid greasy, spicy, heavy foods. If nausea and/or vomiting occur, drink only clear liquids until the nausea and/or vomiting subsides. Call your physician if vomiting continues.  Special Instructions/Symptoms: Your throat may feel dry or sore from the anesthesia or the breathing tube placed in your throat during surgery. If this causes discomfort, gargle with warm salt water. The discomfort should disappear within 24 hours.  Do not take any Tylenol or nonstreroidal anti inflammatories until after 3:00 pm today. [name]

## 2020-12-16 ENCOUNTER — Encounter (HOSPITAL_BASED_OUTPATIENT_CLINIC_OR_DEPARTMENT_OTHER): Payer: Self-pay | Admitting: Surgery

## 2020-12-16 LAB — SURGICAL PATHOLOGY

## 2020-12-16 NOTE — Anesthesia Postprocedure Evaluation (Signed)
Anesthesia Post Note  Patient: David [name]  Procedure(s) Performed: LAPAROSCOPIC BILATERAL INGUINAL HERNIA REPAIR AND POSSIBLE APPENDECTOMY (Bilateral )     Patient location during evaluation: PACU Anesthesia Type: General Level of consciousness: awake and alert and oriented Pain management: pain level controlled Vital Signs Assessment: post-procedure vital signs reviewed and stable Respiratory status: spontaneous breathing, nonlabored ventilation and respiratory function stable Cardiovascular status: blood pressure returned to baseline Postop Assessment: no apparent nausea or vomiting Anesthetic complications: no   No complications documented.  Last Vitals:  Vitals:   12/13/20 1555 12/13/20 1645  BP: 122/82 125/75  Pulse: 97 98  Resp: 14 16  Temp:  36.5 C  SpO2: 98% 97%    Last Pain:  Vitals:   12/13/20 1645  TempSrc:   PainSc: 5                  Kaylyn Layer

## 2021-01-01 ENCOUNTER — Encounter: Payer: Self-pay | Admitting: Registered Nurse

## 2021-01-01 ENCOUNTER — Other Ambulatory Visit: Payer: Self-pay

## 2021-01-01 ENCOUNTER — Encounter
Payer: No Typology Code available for payment source | Attending: Physical Medicine & Rehabilitation | Admitting: Registered Nurse

## 2021-01-01 VITALS — BP 115/75 | HR 86 | Temp 98.8°F | Ht 75.0 in | Wt 224.0 lb

## 2021-01-01 DIAGNOSIS — M47817 Spondylosis without myelopathy or radiculopathy, lumbosacral region: Secondary | ICD-10-CM | POA: Diagnosis present

## 2021-01-01 DIAGNOSIS — M7918 Myalgia, other site: Secondary | ICD-10-CM

## 2021-01-01 DIAGNOSIS — Z79891 Long term (current) use of opiate analgesic: Secondary | ICD-10-CM | POA: Diagnosis present

## 2021-01-01 DIAGNOSIS — M542 Cervicalgia: Secondary | ICD-10-CM | POA: Diagnosis present

## 2021-01-01 DIAGNOSIS — G894 Chronic pain syndrome: Secondary | ICD-10-CM | POA: Diagnosis not present

## 2021-01-01 DIAGNOSIS — M961 Postlaminectomy syndrome, not elsewhere classified: Secondary | ICD-10-CM | POA: Diagnosis present

## 2021-01-01 DIAGNOSIS — M546 Pain in thoracic spine: Secondary | ICD-10-CM | POA: Diagnosis present

## 2021-01-01 DIAGNOSIS — Z5181 Encounter for therapeutic drug level monitoring: Secondary | ICD-10-CM

## 2021-01-01 DIAGNOSIS — M7061 Trochanteric bursitis, right hip: Secondary | ICD-10-CM

## 2021-01-01 DIAGNOSIS — M5412 Radiculopathy, cervical region: Secondary | ICD-10-CM

## 2021-01-01 DIAGNOSIS — G8929 Other chronic pain: Secondary | ICD-10-CM

## 2021-01-01 DIAGNOSIS — M5416 Radiculopathy, lumbar region: Secondary | ICD-10-CM

## 2021-01-01 DIAGNOSIS — M25571 Pain in right ankle and joints of right foot: Secondary | ICD-10-CM

## 2021-01-01 MED ORDER — XTAMPZA ER 18 MG PO C12A: 1.0000 | EXTENDED_RELEASE_CAPSULE | Freq: Two times a day (BID) | ORAL | 0 refills | Status: DC

## 2021-01-01 MED ORDER — OXYCODONE-ACETAMINOPHEN 5-325 MG PO TABS: 1.0000 | ORAL_TABLET | Freq: Two times a day (BID) | ORAL | 0 refills | Status: DC

## 2021-01-01 NOTE — Progress Notes (Signed)
Subjective:    Patient ID: David [name], male    DOB: 26-Sep-1976, 45 y.o.   MRN: [id number]  HPI: [name] is a 45 y.o. male who returns for follow up appointment for chronic pain and medication refill. He  states his pain is located in his neck radiating into his left shoulder and left arm with tingling. Also reports upper- lower back pain radiating into his bilateral lower extremities, right hip,right ankle pain and post-op pain.[name] He rates his pain 6. His current exercise regime is walking. .  [name] was admitted to [facility]  On 12/13/2020 and discharged on the same day. He underwent : by Dr Michaell Cowing.      [name], MD Primary     Procedure Laterality Anesthesia  LAPAROSCOPIC BILATERAL INGUINAL HERNIA REPAIR AND POSSIBLE APPENDECTOMY         [name] Morphine equivalent is 72. . He is also prescribed Diazepam by Mauricio Po Np. We have discussed the black box warning of using opioids and benzodiazepines. I highlighted the dangers of using these drugs together and discussed the adverse events including respiratory suppression, overdose, cognitive impairment and importance of compliance with current regimen. We will continue to monitor and adjust as indicated.     UDS ordered today   Pain Inventory Average Pain 5 Pain Right Now 6 My pain is intermittent, constant, sharp, burning, dull, stabbing, tingling and aching  In the last 24 hours, has pain interfered with the following? General activity 6 Relation with others 5 Enjoyment of life 6 What TIME of day is your pain at its worst? morning , daytime, evening and night Sleep (in general) Fair  Pain is worse with: walking, bending, sitting, inactivity, standing and some activites Pain improves with: rest, heat/ice, therapy/exercise, pacing activities, medication and TENS Relief from Meds: 5  Family History  Problem Relation Age of Onset  . Arthritis Mother   . Heart disease Mother   . Diabetes  Mother   . Heart disease Father   . Diabetes Father    Social History   Socioeconomic History  . Marital status: Single    Spouse name: Not on file  . Number of children: Not on file  . Years of education: Not on file  . Highest education level: Not on file  Occupational History  . Not on file  Tobacco Use  . Smoking status: Never Smoker  . Smokeless tobacco: Never Used  Vaping Use  . Vaping Use: Never used  Substance and Sexual Activity  . Alcohol use: No  . Drug use: Never  . Sexual activity: Not on file  Other Topics Concern  . Not on file  Social History Narrative  . Not on file   Social Determinants of Health   Financial Resource Strain: Not on file  Food Insecurity: Not on file  Transportation Needs: Not on file  Physical Activity: Not on file  Stress: Not on file  Social Connections: Not on file   Past Surgical History:  Procedure Laterality Date  . ANTERIOR CERVICAL DECOMP/DISCECTOMY FUSION  02-15-2008  @[facility]    C4--5,  C6--7, C7--T1  . COLONOSCOPY    . INGUINAL HERNIA REPAIR Bilateral 12/13/2020   Procedure: LAPAROSCOPIC BILATERAL INGUINAL HERNIA REPAIR AND POSSIBLE APPENDECTOMY;  Surgeon: 02/10/2021, MD;  Location: [facility];  Service: General;  Laterality: Bilateral;  ERAS   Past Surgical History:  Procedure Laterality Date  . ANTERIOR CERVICAL DECOMP/DISCECTOMY FUSION  02-15-2008  @[facility]   C4--5,  C6--7, C7--T1  . COLONOSCOPY    . INGUINAL HERNIA REPAIR Bilateral 12/13/2020   Procedure: LAPAROSCOPIC BILATERAL INGUINAL HERNIA REPAIR AND POSSIBLE APPENDECTOMY;  Surgeon: [name], MD;  Location: [facility] [place];  Service: General;  Laterality: Bilateral;  ERAS   Past Medical History:  Diagnosis Date  . Cervicalgia   . Chronic pain syndrome    pain clinic  . Complication of anesthesia    post op urinary retention after ACDF surgery 04/ 2009  . 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 Family  history of adverse reaction to anesthesia    father--- ponv  . GERD (gastroesophageal reflux disease)    occasional, watches diet  . Greater trochanteric bursitis of right hip   . History of kidney stones   . Hypertension   . Inguinal hernia, bilateral   . Lumbar spondylosis   . Nocturia   . Peripheral neuropathy    bilateral lower legs and a little of left arm  . Postlaminectomy syndrome, cervical region   . Primary localized osteoarthrosis, lower leg   . Radiculopathy, multiple sites in spine    cervical , thoracic, lumbar  . Type 2 diabetes mellitus (HCC)    followed by pcp---  (12-11-2020 checks blood sugar 3 times wkly,  fasting sugar--- 125 )  . Wears glasses    BP 115/75   Pulse 86   Temp 98.8 F (37.1 C)   Ht 6\' 3"  (1.905 m)   Wt 224 lb (101.6 kg)   SpO2 97%   BMI 28.00 kg/m   Opioid Risk Score:   Fall Risk Score:  `1  Depression screen PHQ 2/9  Depression screen [facility] 2/9 12/04/2020 10/02/2020 07/11/2020 03/06/2020 03/03/2019 01/13/2019 12/16/2018  Decreased Interest 1 0 1 1 1 1  0  Down, Depressed, Hopeless 1 0 0 1 1 1  0  PHQ - 2 Score 2 0 1 2 2 2  0  Altered sleeping - - 2 - - - -  Tired, decreased energy - - 1 - - - -  Change in appetite - - 0 - - - -  Feeling bad or failure about yourself  - - 0 - - - -  Trouble concentrating - - 0 - - - -  Moving slowly or fidgety/restless - - 0 - - - -  Suicidal thoughts - - 0 - - - -  PHQ-9 Score - - 4 - - - -  Difficult doing work/chores - - - - - - -    Review of Systems  Constitutional: Negative.   HENT: Negative.   Eyes: Negative.   Respiratory: Negative.   Cardiovascular: Negative.   Gastrointestinal: Positive for abdominal pain.  Endocrine: Negative.   Genitourinary: Negative.   Musculoskeletal: Positive for back pain and neck pain.  Skin: Negative.   Allergic/Immunologic: Negative.   Neurological: Negative.   Hematological: Negative.   Psychiatric/Behavioral: Negative.   All other systems reviewed and are  negative.      Objective:   Physical Exam Vitals and nursing note reviewed.  Constitutional:      Appearance: Normal appearance.  Cardiovascular:     Rate and Rhythm: Normal rate and regular rhythm.     Pulses: Normal pulses.     Heart sounds: Normal heart sounds.  Musculoskeletal:     Cervical back: Normal range of motion and neck supple.     Comments: Normal Muscle Bulk and Muscle Testing Reveals:  Upper Extremities: Full ROM and Muscle  Strength 5/5 Bilateral AC Joint Tenderness  Thoracic and Lumbar Hypersensitivity Right Greater Trochanter Tenderness Lower Extremities: Full ROM and Muscle Strength 5/5 Bilateral Lower Extremities Flexion Produces Pain into his Bilateral Lower Extremities Arises from Chair slowly Narrow Based Gait   Skin:    General: Skin is warm and dry.  Neurological:     Mental Status: He is alert.           Assessment & Plan:  1. Cervical postlaminectomy syndrome, Hx of ACDF C4-5, C6-7, C7-T1,02/2008. Continue current medication and alternate using heat and ice therapy.01/01/2021 2. Spondylosis of L-spine . Continue current treatment regime.01/01/2021 3. ChronicPain SyndromeRefilled:Xtampza 18 mg one capsule every 12 hours for pain #60 andOxycodone 5/325mg  one tablet twice a day, may take a extra tablet when pain is sever# 75. Continue Voltaren Gel/ Flector Patch.01/01/2021 We will continue the opioid monitoring program, this consists of regular clinic visits, examinations, urine drug screen, pill counts as well as use of [place] Controlled Substance Reporting system. A 12 month History has been reviewed on the [place] Controlled Substance Reporting Systemon03/12/2020. 4. Diabetes with neuropathy. Continuecurrent medication regimen withGabapentin.Keep blood sugars under tight control. Continue to monitor.01/01/2021 5.Cervicalgia/Cervical Radiculopathy/ ThoracicRadiculopathy/ LumbarRadiculopathy:Continue  Gabapentin.01/01/2021 6.RightGreater Trochanter Bursitis: Continue Ice/Heat and Current medication regimen.01/01/2021 7. Chronic Pain Syndrome/ Myofascial Pain: Continue Flector Patch/ Alternates with Voltaren Gel.01/01/2021 8. Sacroiliac Pain:Continue HEP as Tolerated.Continue to Monitor.01/01/2021 9. Chronic Right Ankle Pain:.Continue HEP as tolerated. Continue to Monitor.01/01/2021. 10. Post-Op Pain: S/P LAPAROSCOPIC BILATERAL INGUINAL HERNIA REPAIR AND POSSIBLE APPENDECTOMY Dr Michaell Cowing Following.   F/U in 1 month

## 2021-01-10 LAB — TOXASSURE SELECT,+ANTIDEPR,UR

## 2021-02-04 ENCOUNTER — Telehealth: Payer: Self-pay | Admitting: *Deleted

## 2021-02-04 NOTE — Telephone Encounter (Signed)
Urine drug screen for this encounter is consistent for prescribed medication 

## 2021-02-05 ENCOUNTER — Encounter: Payer: Self-pay | Admitting: Registered Nurse

## 2021-02-05 ENCOUNTER — Encounter
Payer: No Typology Code available for payment source | Attending: Physical Medicine & Rehabilitation | Admitting: Registered Nurse

## 2021-02-05 ENCOUNTER — Other Ambulatory Visit: Payer: Self-pay | Admitting: Registered Nurse

## 2021-02-05 ENCOUNTER — Other Ambulatory Visit: Payer: Self-pay

## 2021-02-05 VITALS — BP 111/78 | HR 104 | Temp 98.8°F | Ht 75.0 in | Wt 219.2 lb

## 2021-02-05 DIAGNOSIS — M542 Cervicalgia: Secondary | ICD-10-CM | POA: Insufficient documentation

## 2021-02-05 DIAGNOSIS — M5412 Radiculopathy, cervical region: Secondary | ICD-10-CM | POA: Insufficient documentation

## 2021-02-05 DIAGNOSIS — M47817 Spondylosis without myelopathy or radiculopathy, lumbosacral region: Secondary | ICD-10-CM | POA: Diagnosis present

## 2021-02-05 DIAGNOSIS — M546 Pain in thoracic spine: Secondary | ICD-10-CM | POA: Diagnosis present

## 2021-02-05 DIAGNOSIS — Z5181 Encounter for therapeutic drug level monitoring: Secondary | ICD-10-CM | POA: Insufficient documentation

## 2021-02-05 DIAGNOSIS — M25571 Pain in right ankle and joints of right foot: Secondary | ICD-10-CM | POA: Insufficient documentation

## 2021-02-05 DIAGNOSIS — G894 Chronic pain syndrome: Secondary | ICD-10-CM | POA: Diagnosis present

## 2021-02-05 DIAGNOSIS — M961 Postlaminectomy syndrome, not elsewhere classified: Secondary | ICD-10-CM | POA: Diagnosis present

## 2021-02-05 DIAGNOSIS — M7061 Trochanteric bursitis, right hip: Secondary | ICD-10-CM | POA: Insufficient documentation

## 2021-02-05 DIAGNOSIS — Z79891 Long term (current) use of opiate analgesic: Secondary | ICD-10-CM | POA: Diagnosis present

## 2021-02-05 DIAGNOSIS — G8929 Other chronic pain: Secondary | ICD-10-CM | POA: Insufficient documentation

## 2021-02-05 DIAGNOSIS — M5416 Radiculopathy, lumbar region: Secondary | ICD-10-CM | POA: Insufficient documentation

## 2021-02-05 MED ORDER — OXYCODONE-ACETAMINOPHEN 5-325 MG PO TABS: 1.0000 | ORAL_TABLET | Freq: Two times a day (BID) | ORAL | 0 refills | Status: DC

## 2021-02-05 MED ORDER — XTAMPZA ER 18 MG PO C12A: 1.0000 | EXTENDED_RELEASE_CAPSULE | Freq: Two times a day (BID) | ORAL | 0 refills | Status: DC

## 2021-02-05 NOTE — Progress Notes (Signed)
Subjective:    Patient ID: David [name], male    DOB: [birth year], 45 y.o.   MRN: [id number]  HPI: David [name] is a 45 y.o. male who returns for follow up appointment for chronic pain and medication refill. He states his pain is located in his neck radiating into his left arm with tingling and burning, mid- lower back pain radiating into his bilateral lower extremities L>R, right hip pain and right ankle pain. He rates his pain 5. His current exercise regime is walking and performing stretching exercises.  Ms. Hutmacher Morphine equivalent is 65.63 MME.He is also prescribed Diazepam by Mauricio Po Np. We have discussed the black box warning of using opioids and benzodiazepines. I highlighted the dangers of using these drugs together and discussed the adverse events including respiratory suppression, overdose, cognitive impairment and importance of compliance with current regimen. We will continue to monitor and adjust as indicated.    Last UDS was Performed on 01/01/2021, it was consistent.    Pain Inventory Average Pain 5 Pain Right Now 5 My pain is intermittent, constant, sharp, burning, dull, stabbing, tingling and aching  In the last 24 hours, has pain interfered with the following? General activity 5 Relation with others 6 Enjoyment of life 5 What TIME of day is your pain at its worst? morning , daytime, evening and night Sleep (in general) Fair  Pain is worse with: walking, bending, sitting, inactivity, standing and some activites Pain improves with: rest, heat/ice, therapy/exercise, pacing activities, medication and TENS Relief from Meds: 4     Family History  Problem Relation Age of Onset  . Arthritis Mother   . Heart disease Mother   . Diabetes Mother   . Heart disease Father   . Diabetes Father    Social History   Socioeconomic History  . Marital status: Single    Spouse name: Not on file  . Number of children: Not on file  . Years of education: Not on  file  . Highest education level: Not on file  Occupational History  . Not on file  Tobacco Use  . Smoking status: Never Smoker  . Smokeless tobacco: Never Used  Vaping Use  . Vaping Use: Never used  Substance and Sexual Activity  . Alcohol use: No  . Drug use: Never  . Sexual activity: Not on file  Other Topics Concern  . Not on file  Social History Narrative  . Not on file   Social Determinants of Health   Financial Resource Strain: Not on file  Food Insecurity: Not on file  Transportation Needs: Not on file  Physical Activity: Not on file  Stress: Not on file  Social Connections: Not on file   Past Surgical History:  Procedure Laterality Date  . ANTERIOR CERVICAL DECOMP/DISCECTOMY FUSION  02-15-2008  @[facility]    C4--5,  C6--7, C7--T1  . COLONOSCOPY    . INGUINAL HERNIA REPAIR Bilateral 12/13/2020   Procedure: LAPAROSCOPIC BILATERAL INGUINAL HERNIA REPAIR AND POSSIBLE APPENDECTOMY;  Surgeon: 02/10/2021, MD;  Location: [facility];  Service: General;  Laterality: Bilateral;  ERAS   Past Medical History:  Diagnosis Date  . Cervicalgia   . Chronic pain syndrome    pain clinic  . Complication of anesthesia    post op urinary retention after ACDF surgery 04/ 2009  . 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 Family history of adverse reaction to anesthesia    father--- ponv  . GERD (gastroesophageal  reflux disease)    occasional, watches diet  . Greater trochanteric bursitis of right hip   . History of kidney stones   . Hypertension   . Inguinal hernia, bilateral   . Lumbar spondylosis   . Nocturia   . Peripheral neuropathy    bilateral lower legs and a little of left arm  . Postlaminectomy syndrome, cervical region   . Primary localized osteoarthrosis, lower leg   . Radiculopathy, multiple sites in spine    cervical , thoracic, lumbar  . Type 2 diabetes mellitus (HCC)    followed by pcp---  (12-11-2020 checks blood sugar 3  times wkly,  fasting sugar--- 125 )  . Wears glasses    BP 111/78   Pulse (!) 104   Temp 98.8 F (37.1 C)   Ht 6\' 3"  (1.905 m)   Wt 219 lb 3.2 oz (99.4 kg)   SpO2 96%   BMI 27.40 kg/m   Opioid Risk Score:   Fall Risk Score:  `1  Depression screen PHQ 2/9  Depression screen [facility] 2/9 12/04/2020 10/02/2020 07/11/2020 03/06/2020 03/03/2019 01/13/2019 12/16/2018  Decreased Interest 1 0 1 1 1 1  0  Down, Depressed, Hopeless 1 0 0 1 1 1  0  PHQ - 2 Score 2 0 1 2 2 2  0  Altered sleeping - - 2 - - - -  Tired, decreased energy - - 1 - - - -  Change in appetite - - 0 - - - -  Feeling bad or failure about yourself  - - 0 - - - -  Trouble concentrating - - 0 - - - -  Moving slowly or fidgety/restless - - 0 - - - -  Suicidal thoughts - - 0 - - - -  PHQ-9 Score - - 4 - - - -  Difficult doing work/chores - - - - - - -   Review of Systems  Musculoskeletal: Positive for arthralgias, back pain, gait problem and neck stiffness.  All other systems reviewed and are negative.      Objective:   Physical Exam Vitals and nursing note reviewed.  Constitutional:      Appearance: Normal appearance.  Cardiovascular:     Rate and Rhythm: Normal rate and regular rhythm.     Pulses: Normal pulses.     Heart sounds: Normal heart sounds.  Pulmonary:     Effort: Pulmonary effort is normal.     Breath sounds: Normal breath sounds.  Musculoskeletal:     Cervical back: Normal range of motion and neck supple.     Comments: Normal Muscle Bulk and Muscle Testing Reveals:  Upper Extremities: Full ROM and Muscle Strength 5/5 Bilateral AC Joint Tenderness  Thoracic and  Lumbar Hypersensitivity Right Greater Trochanter Tenderness Lower Extremities: Full ROM and Muscle Strength 5/5 Bilateral Lower Extremities Flexion Produces Pain into his Bilateral Patella's Arises from Table with ease Narrow Based  Gait   Skin:    General: Skin is warm and dry.  Neurological:     Mental Status: He is alert and oriented to  person, place, and time.  Psychiatric:        Mood and Affect: Mood normal.        Behavior: Behavior normal.           Assessment & Plan:  1. Cervical postlaminectomy syndrome, Hx of ACDF C4-5, C6-7, C7-T1,02/2008. Continue current medication and alternate using heat and ice therapy.02/05/2021 2. Spondylosis of L-spine . Continue current treatment regime.02/05/2021 3. ChronicPain  SyndromeRefilled:Xtampza 18 mg one capsule every 12 hours for pain #60 andOxycodone 5/325mg  one tablet twice a day, may take a extra tablet when pain is sever# 75. Continue Voltaren Gel/ Flector Patch.02/05/2021 We will continue the opioid monitoring program, this consists of regular clinic visits, examinations, urine drug screen, pill counts as well as use of [place] Controlled Substance Reporting system. A 12 month History has been reviewed on the [place] Controlled Substance Reporting Systemon04/04/2021. 4. Diabetes with neuropathy. Continuecurrent medication regimen withGabapentin.Keep blood sugars under tight control. Continue to monitor.02/05/2021 5.Cervicalgia/Cervical Radiculopathy/ ThoracicRadiculopathy/ LumbarRadiculopathy:Continue Gabapentin.02/05/2021 6.RightGreater Trochanter Bursitis: Continue Ice/Heat and Current medication regimen.02/05/2021 7. Chronic Pain Syndrome/ Myofascial Pain: Continue Flector Patch/ Alternates with Voltaren Gel.02/05/2021 8. Sacroiliac Pain:Continue HEP as Tolerated.Continue to Monitor.02/05/2021 9. Chronic Right Ankle Pain:.Continue HEP as tolerated. Continue to Monitor.02/05/2021. 10. Post-Op Pain:  No complaints Today.  S/P LAPAROSCOPIC BILATERAL INGUINAL HERNIA REPAIR AND POSSIBLE APPENDECTOMY Dr Michaell Cowing Following.   F/U in 1 month

## 2021-03-05 ENCOUNTER — Encounter
Payer: No Typology Code available for payment source | Attending: Physical Medicine & Rehabilitation | Admitting: Registered Nurse

## 2021-03-05 ENCOUNTER — Encounter: Payer: Self-pay | Admitting: Registered Nurse

## 2021-03-05 ENCOUNTER — Other Ambulatory Visit: Payer: Self-pay

## 2021-03-05 VITALS — BP 115/78 | HR 99 | Temp 98.8°F | Ht 75.0 in | Wt 220.4 lb

## 2021-03-05 DIAGNOSIS — M546 Pain in thoracic spine: Secondary | ICD-10-CM | POA: Diagnosis present

## 2021-03-05 DIAGNOSIS — Z79891 Long term (current) use of opiate analgesic: Secondary | ICD-10-CM | POA: Diagnosis present

## 2021-03-05 DIAGNOSIS — M7061 Trochanteric bursitis, right hip: Secondary | ICD-10-CM | POA: Diagnosis present

## 2021-03-05 DIAGNOSIS — M5412 Radiculopathy, cervical region: Secondary | ICD-10-CM | POA: Insufficient documentation

## 2021-03-05 DIAGNOSIS — G8929 Other chronic pain: Secondary | ICD-10-CM | POA: Insufficient documentation

## 2021-03-05 DIAGNOSIS — M961 Postlaminectomy syndrome, not elsewhere classified: Secondary | ICD-10-CM | POA: Insufficient documentation

## 2021-03-05 DIAGNOSIS — M542 Cervicalgia: Secondary | ICD-10-CM | POA: Insufficient documentation

## 2021-03-05 DIAGNOSIS — M5416 Radiculopathy, lumbar region: Secondary | ICD-10-CM | POA: Diagnosis present

## 2021-03-05 DIAGNOSIS — M47817 Spondylosis without myelopathy or radiculopathy, lumbosacral region: Secondary | ICD-10-CM | POA: Diagnosis present

## 2021-03-05 DIAGNOSIS — G894 Chronic pain syndrome: Secondary | ICD-10-CM | POA: Diagnosis present

## 2021-03-05 DIAGNOSIS — Z5181 Encounter for therapeutic drug level monitoring: Secondary | ICD-10-CM | POA: Insufficient documentation

## 2021-03-05 DIAGNOSIS — M25571 Pain in right ankle and joints of right foot: Secondary | ICD-10-CM | POA: Insufficient documentation

## 2021-03-05 MED ORDER — XTAMPZA ER 18 MG PO C12A: 1.0000 | EXTENDED_RELEASE_CAPSULE | Freq: Two times a day (BID) | ORAL | 0 refills | Status: DC

## 2021-03-05 MED ORDER — OXYCODONE-ACETAMINOPHEN 5-325 MG PO TABS: 1.0000 | ORAL_TABLET | Freq: Two times a day (BID) | ORAL | 0 refills | Status: DC

## 2021-03-05 NOTE — Progress Notes (Signed)
Subjective:    Patient ID: David [name], male    DOB: Feb 10, 1976, 45 y.o.   MRN: [id number]  HPI: David [name] is a 45 y.o. male who returns for follow up appointment for chronic pain and medication refill. He states his pain is located in his neck radiating into his bilateral shoulders, left arm and left ring and left pinky finger with tingling. He also reports mid- back pain radiates outward and lower back pain radiates into his bilateral lower extremities L>R.David [name] states he has right hip and right ankle pain. He rates his pain 5. His current exercise regime is walking and performing stretching exercises.  David [name] Morphine equivalent is 78.75 MME.He is also prescribed Diazepam by Mauricio Po Np. We have discussed the black box warning of using opioids and benzodiazepines. I highlighted the dangers of using these drugs together and discussed the adverse events including respiratory suppression, overdose, cognitive impairment and importance of compliance with current regimen. We will continue to monitor and adjust as indicated.   Last UDS was Performed on 01/01/2021, it was consistent.      Pain Inventory Average Pain 5 Pain Right Now 5 My pain is intermittent, constant, sharp, burning, dull, stabbing, tingling and aching  In the last 24 hours, has pain interfered with the following? General activity 6 Relation with others 5 Enjoyment of life 5 What TIME of day is your pain at its worst? morning , daytime, evening and night Sleep (in general) Fair  Pain is worse with: walking, bending, sitting, inactivity, standing and some activites Pain improves with: rest, heat/ice, therapy/exercise, pacing activities, medication, TENS and injections Relief from Meds: 4  Family History  Problem Relation Age of Onset  . Arthritis Mother   . Heart disease Mother   . Diabetes Mother   . Heart disease Father   . Diabetes Father    Social History   Socioeconomic History  .  Marital status: Single    Spouse name: Not on file  . Number of children: Not on file  . Years of education: Not on file  . Highest education level: Not on file  Occupational History  . Not on file  Tobacco Use  . Smoking status: Never Smoker  . Smokeless tobacco: Never Used  Vaping Use  . Vaping Use: Never used  Substance and Sexual Activity  . Alcohol use: No  . Drug use: Never  . Sexual activity: Not on file  Other Topics Concern  . Not on file  Social History Narrative  . Not on file   Social Determinants of Health   Financial Resource Strain: Not on file  Food Insecurity: Not on file  Transportation Needs: Not on file  Physical Activity: Not on file  Stress: Not on file  Social Connections: Not on file   Past Surgical History:  Procedure Laterality Date  . ANTERIOR CERVICAL DECOMP/DISCECTOMY FUSION  02-15-2008  @[facility]    C4--5,  C6--7, C7--T1  . COLONOSCOPY    . INGUINAL HERNIA REPAIR Bilateral 12/13/2020   Procedure: LAPAROSCOPIC BILATERAL INGUINAL HERNIA REPAIR AND POSSIBLE APPENDECTOMY;  Surgeon: 02/10/2021, MD;  Location: [facility] [place];  Service: General;  Laterality: Bilateral;  ERAS   Past Surgical History:  Procedure Laterality Date  . ANTERIOR CERVICAL DECOMP/DISCECTOMY FUSION  02-15-2008  @[facility]    C4--5,  C6--7, C7--T1  . COLONOSCOPY    . INGUINAL HERNIA REPAIR Bilateral 12/13/2020   Procedure: LAPAROSCOPIC BILATERAL INGUINAL HERNIA REPAIR AND POSSIBLE APPENDECTOMY;  Surgeon:  [name], MD;  Location: [facility];  Service: General;  Laterality: Bilateral;  ERAS   Past Medical History:  Diagnosis Date  . Cervicalgia   . Chronic pain syndrome    pain clinic  . Complication of anesthesia    post op urinary retention after ACDF surgery 04/ 2009  . 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 Family history of adverse reaction to anesthesia    father--- ponv  . GERD (gastroesophageal reflux disease)     occasional, watches diet  . Greater trochanteric bursitis of right hip   . History of kidney stones   . Hypertension   . Inguinal hernia, bilateral   . Lumbar spondylosis   . Nocturia   . Peripheral neuropathy    bilateral lower legs and a little of left arm  . Postlaminectomy syndrome, cervical region   . Primary localized osteoarthrosis, lower leg   . Radiculopathy, multiple sites in spine    cervical , thoracic, lumbar  . Type 2 diabetes mellitus (HCC)    followed by pcp---  (12-11-2020 checks blood sugar 3 times wkly,  fasting sugar--- 125 )  . Wears glasses    BP 115/78   Pulse 99   Temp 98.8 F (37.1 C)   Ht 6\' 3"  (1.905 m)   Wt 220 lb 6.4 oz (100 kg)   SpO2 98%   BMI 27.55 kg/m   Opioid Risk Score:   Fall Risk Score:  `1  Depression screen PHQ 2/9  Depression screen [facility] 2/9 02/05/2021 12/04/2020 10/02/2020 07/11/2020 03/06/2020 03/03/2019 01/13/2019  Decreased Interest 1 1 0 1 1 1 1   Down, Depressed, Hopeless 1 1 0 0 1 1 1   PHQ - 2 Score 2 2 0 1 2 2 2   Altered sleeping - - - 2 - - -  Tired, decreased energy - - - 1 - - -  Change in appetite - - - 0 - - -  Feeling bad or failure about yourself  - - - 0 - - -  Trouble concentrating - - - 0 - - -  Moving slowly or fidgety/restless - - - 0 - - -  Suicidal thoughts - - - 0 - - -  PHQ-9 Score - - - 4 - - -  Difficult doing work/chores - - - - - - -   Review of Systems  Musculoskeletal: Positive for arthralgias, back pain, gait problem and neck pain.  All other systems reviewed and are negative.      Objective:   Physical Exam Vitals and nursing note reviewed.  Constitutional:      Appearance: Normal appearance.  Cardiovascular:     Rate and Rhythm: Normal rate and regular rhythm.     Pulses: Normal pulses.     Heart sounds: Normal heart sounds.  Pulmonary:     Effort: Pulmonary effort is normal.     Breath sounds: Normal breath sounds.  Musculoskeletal:     Cervical back: Normal range of motion and neck supple.      Comments: Normal Muscle Bulk and Muscle Testing Reveals:  Upper Extremities: Full ROM and Muscle Strength 5/5 Thoracic Paraspinal Tenderness: T-7-T-9  Lumbar  Hypersensitivity Right Greater Trochanter Tenderness Lower Extremities: Full ROM and Muscle Strength 5/5 Right Lower Extremity Flexion Produces Pain into his Right Hip and Right Lower Extremity Left Lower Extremity Flexion Produces Pain into his Left Lower Extremity Arises from chair slowly Narrow based  Gait   Skin:  General: Skin is warm and dry.  Neurological:     Mental Status: He is alert and oriented to person, place, and time.  Psychiatric:        Mood and Affect: Mood normal.        Behavior: Behavior normal.           Assessment & Plan:  1. Cervical postlaminectomy syndrome, Hx of ACDF C4-5, C6-7, C7-T1,02/2008. Continue current medication and alternate using heat and ice therapy.03/05/2021 2. Spondylosis of L-spine . Continue current treatment regime.03/05/2021 3. ChronicPain SyndromeRefilled:Xtampza 18 mg one capsule every 12 hours for pain #60 andOxycodone 5/325mg  one tablet twice a day, may take a extra tablet when pain is sever# 75. Continue Voltaren Gel/ Flector Patch.03/05/2021 We will continue the opioid monitoring program, this consists of regular clinic visits, examinations, urine drug screen, pill counts as well as use of [place] Controlled Substance Reporting system. A 12 month History has been reviewed on the [place] Controlled Substance Reporting Systemon05/01/2021. 4. Diabetes with neuropathy. Continuecurrent medication regimen withGabapentin.Keep blood sugars under tight control. Continue to monitor.03/05/2021 5.Cervicalgia/Cervical Radiculopathy/ ThoracicRadiculopathy/ LumbarRadiculopathy:Continue Gabapentin.03/05/2021 6.RightGreater Trochanter Bursitis: Continue Ice/Heat and Current medication regimen.03/05/2021 7. Chronic Pain Syndrome/ Myofascial Pain:  Continue Flector Patch/ Alternates with Voltaren Gel.03/05/2021 8. Sacroiliac Pain:Continue HEP as Tolerated.Continue to Monitor.03/05/2021 9. Chronic Right Ankle Pain:.Continue HEP as tolerated. Continue to Monitor.03/05/2021. 10. Post-Op Pain:  No complaints Today.  S/PLAPAROSCOPIC BILATERAL INGUINAL HERNIA REPAIR AND POSSIBLE APPENDECTOMY Dr Michaell Cowing Following.  F/U in 1 month

## 2021-04-04 ENCOUNTER — Encounter
Payer: No Typology Code available for payment source | Attending: Physical Medicine & Rehabilitation | Admitting: Registered Nurse

## 2021-04-04 ENCOUNTER — Other Ambulatory Visit: Payer: Self-pay

## 2021-04-04 ENCOUNTER — Encounter: Payer: Self-pay | Admitting: Registered Nurse

## 2021-04-04 VITALS — BP 114/77 | HR 101 | Temp 99.1°F | Ht 75.0 in | Wt 217.8 lb

## 2021-04-04 DIAGNOSIS — M7061 Trochanteric bursitis, right hip: Secondary | ICD-10-CM | POA: Insufficient documentation

## 2021-04-04 DIAGNOSIS — M47817 Spondylosis without myelopathy or radiculopathy, lumbosacral region: Secondary | ICD-10-CM | POA: Diagnosis present

## 2021-04-04 DIAGNOSIS — M5412 Radiculopathy, cervical region: Secondary | ICD-10-CM | POA: Insufficient documentation

## 2021-04-04 DIAGNOSIS — M5416 Radiculopathy, lumbar region: Secondary | ICD-10-CM | POA: Diagnosis present

## 2021-04-04 DIAGNOSIS — G8929 Other chronic pain: Secondary | ICD-10-CM | POA: Diagnosis present

## 2021-04-04 DIAGNOSIS — M25571 Pain in right ankle and joints of right foot: Secondary | ICD-10-CM | POA: Diagnosis present

## 2021-04-04 DIAGNOSIS — Z5181 Encounter for therapeutic drug level monitoring: Secondary | ICD-10-CM | POA: Insufficient documentation

## 2021-04-04 DIAGNOSIS — M546 Pain in thoracic spine: Secondary | ICD-10-CM | POA: Insufficient documentation

## 2021-04-04 DIAGNOSIS — M961 Postlaminectomy syndrome, not elsewhere classified: Secondary | ICD-10-CM | POA: Insufficient documentation

## 2021-04-04 DIAGNOSIS — G894 Chronic pain syndrome: Secondary | ICD-10-CM | POA: Insufficient documentation

## 2021-04-04 DIAGNOSIS — Z79891 Long term (current) use of opiate analgesic: Secondary | ICD-10-CM | POA: Insufficient documentation

## 2021-04-04 DIAGNOSIS — M542 Cervicalgia: Secondary | ICD-10-CM | POA: Insufficient documentation

## 2021-04-04 MED ORDER — XTAMPZA ER 18 MG PO C12A: 1.0000 | EXTENDED_RELEASE_CAPSULE | Freq: Two times a day (BID) | ORAL | 0 refills | Status: DC

## 2021-04-04 MED ORDER — OXYCODONE-ACETAMINOPHEN 5-325 MG PO TABS: 1.0000 | ORAL_TABLET | Freq: Two times a day (BID) | ORAL | 0 refills | Status: DC

## 2021-04-04 NOTE — Progress Notes (Signed)
Subjective:    Patient ID: David [name], male    DOB: 25-Mar-1976, 45 y.o.   MRN: [id number]  HPI: [name] is a 45 y.o. male who returns for follow up appointment for chronic pain and medication refill. He states his pain is located in his neck radiating into his left arm and left hand with tingling, upper lower back pain radiating into his bilateral lower extremities L>R , right hip pain and right ankle pain. He rates his pain 5. His current exercise regime is walking and performing stretching exercises.   [name] Morphine equivalent is 78.75 MME.  His also prescribed diazepam by Mauricio Po Np. .We have discussed the black box warning of using opioids and benzodiazepines. I highlighted the dangers of using these drugs together and discussed the adverse events including respiratory suppression, overdose, cognitive impairment and importance of compliance with current regimen. We will continue to monitor and adjust as indicated.   Last UDS was Performed on 01/01/2021, it was consistent.   Pain Inventory Average Pain 5 Pain Right Now 5 My pain is intermittent, constant, sharp, burning, dull, stabbing, tingling and aching  In the last 24 hours, has pain interfered with the following? General activity 5 Relation with others 5 Enjoyment of life 5 What TIME of day is your pain at its worst? morning , daytime, evening and night Sleep (in general) Fair  Pain is worse with: walking, bending, sitting, inactivity, standing and some activites Pain improves with: rest, heat/ice, therapy/exercise, pacing activities, medication and TENS Relief from Meds: 5  Family History  Problem Relation Age of Onset  . Arthritis Mother   . Heart disease Mother   . Diabetes Mother   . Heart disease Father   . Diabetes Father    Social History   Socioeconomic History  . Marital status: Single    Spouse name: Not on file  . Number of children: Not on file  . Years of education: Not on  file  . Highest education level: Not on file  Occupational History  . Not on file  Tobacco Use  . Smoking status: Never Smoker  . Smokeless tobacco: Never Used  Vaping Use  . Vaping Use: Never used  Substance and Sexual Activity  . Alcohol use: No  . Drug use: Never  . Sexual activity: Not on file  Other Topics Concern  . Not on file  Social History Narrative  . Not on file   Social Determinants of Health   Financial Resource Strain: Not on file  Food Insecurity: Not on file  Transportation Needs: Not on file  Physical Activity: Not on file  Stress: Not on file  Social Connections: Not on file   Past Surgical History:  Procedure Laterality Date  . ANTERIOR CERVICAL DECOMP/DISCECTOMY FUSION  02-15-2008  @[facility]    C4--5,  C6--7, C7--T1  . COLONOSCOPY    . INGUINAL HERNIA REPAIR Bilateral 12/13/2020   Procedure: LAPAROSCOPIC BILATERAL INGUINAL HERNIA REPAIR AND POSSIBLE APPENDECTOMY;  Surgeon: 02/10/2021, MD;  Location: [facility];  Service: General;  Laterality: Bilateral;  ERAS   Past Surgical History:  Procedure Laterality Date  . ANTERIOR CERVICAL DECOMP/DISCECTOMY FUSION  02-15-2008  @[facility]    C4--5,  C6--7, C7--T1  . COLONOSCOPY    . INGUINAL HERNIA REPAIR Bilateral 12/13/2020   Procedure: LAPAROSCOPIC BILATERAL INGUINAL HERNIA REPAIR AND POSSIBLE APPENDECTOMY;  Surgeon: , MD;  Location: [facility] [place];  Service: General;  Laterality: Bilateral;  ERAS  Past Medical History:  Diagnosis Date  . Cervicalgia   . Chronic pain syndrome    pain clinic  . Complication of anesthesia    post op urinary retention after ACDF surgery 04/ 2009  . 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 Family history of adverse reaction to anesthesia    father--- ponv  . GERD (gastroesophageal reflux disease)    occasional, watches diet  . Greater trochanteric bursitis of right hip   . History of kidney stones   . Hypertension    . Inguinal hernia, bilateral   . Lumbar spondylosis   . Nocturia   . Peripheral neuropathy    bilateral lower legs and a little of left arm  . Postlaminectomy syndrome, cervical region   . Primary localized osteoarthrosis, lower leg   . Radiculopathy, multiple sites in spine    cervical , thoracic, lumbar  . Type 2 diabetes mellitus (HCC)    followed by pcp---  (12-11-2020 checks blood sugar 3 times wkly,  fasting sugar--- 125 )  . Wears glasses    BP 114/77   Pulse (!) 101   Temp 99.1 F (37.3 C)   Ht 6\' 3"  (1.905 m)   Wt 217 lb 12.8 oz (98.8 kg)   SpO2 98%   BMI 27.22 kg/m   Opioid Risk Score:   Fall Risk Score:  `1  Depression screen PHQ 2/9  Depression screen [facility] 2/9 03/05/2021 02/05/2021 12/04/2020 10/02/2020 07/11/2020 03/06/2020 03/03/2019  Decreased Interest 1 1 1  0 1 1 1   Down, Depressed, Hopeless 1 1 1  0 0 1 1  PHQ - 2 Score 2 2 2  0 1 2 2   Altered sleeping - - - - 2 - -  Tired, decreased energy - - - - 1 - -  Change in appetite - - - - 0 - -  Feeling bad or failure about yourself  - - - - 0 - -  Trouble concentrating - - - - 0 - -  Moving slowly or fidgety/restless - - - - 0 - -  Suicidal thoughts - - - - 0 - -  PHQ-9 Score - - - - 4 - -  Difficult doing work/chores - - - - - - -    Review of Systems  Musculoskeletal: Positive for back pain.       Leg pain Arm pain  Neurological: Positive for numbness.  All other systems reviewed and are negative.      Objective:   Physical Exam Vitals and nursing note reviewed.  Constitutional:      Appearance: Normal appearance.  Neck:     Comments: Cervical Paraspinal Tenderness: C-5-C-6 Cardiovascular:     Rate and Rhythm: Normal rate and regular rhythm.     Pulses: Normal pulses.     Heart sounds: Normal heart sounds.  Pulmonary:     Effort: Pulmonary effort is normal.     Breath sounds: Normal breath sounds.  Musculoskeletal:     Cervical back: Normal range of motion and neck supple.     Comments: Normal Muscle  Bulk and Muscle Testing Reveals:  Upper Extremities: Upper Full ROM and Muscle Strength 5/5 Bilateral AC Joint Tenderness: L>R Thoracic and Lumbar Hypersensitivity Right Greater Trochanter Tenderness Lower Extremities: Full ROM and Muscle Strength 5/5 Arises from chair with ease Narrow Based  Gait   Skin:    General: Skin is warm and dry.  Neurological:     Mental Status: He is alert and  oriented to person, place, and time.  Psychiatric:        Mood and Affect: Mood normal.        Behavior: Behavior normal.           Assessment & Plan:  1. Cervical postlaminectomy syndrome, Hx of ACDF C4-5, C6-7, C7-T1,02/2008. Continue current medication and alternate using heat and ice therapy.04/04/2021 2. Spondylosis of L-spine . Continue current treatment regime.04/04/2021 3. ChronicPain SyndromeRefilled:Xtampza 18 mg one capsule every 12 hours for pain #60 andOxycodone 5/325mg  one tablet twice a day, may take a extra tablet when pain is sever# 75. Continue Voltaren Gel/ Flector Patch.04/04/2021 We will continue the opioid monitoring program, this consists of regular clinic visits, examinations, urine drug screen, pill counts as well as use of [place] Controlled Substance Reporting system. A 12 month History has been reviewed on the [place] Controlled Substance Reporting Systemon06/12/2020. 4. Diabetes with neuropathy. Continuecurrent medication regimen withGabapentin.Keep blood sugars under tight control. Continue to monitor.04/04/2021 5.Cervicalgia/Cervical Radiculopathy/ ThoracicRadiculopathy/ LumbarRadiculopathy:Continue Gabapentin.04/04/2021 6.RightGreater Trochanter Bursitis: Continue Ice/Heat and Current medication regimen.04/04/2021 7. Chronic Pain Syndrome/ Myofascial Pain: Continue Flector Patch/ Alternates with Voltaren Gel.04/04/2021 8. Sacroiliac Pain:Continue HEP as Tolerated.Continue to Monitor.04/04/2021 9. Chronic Right Ankle  Pain:.Continue HEP as tolerated. Continue to Monitor.04/04/2021. 10. Post-Op Pain:No complaints Today. S/PLAPAROSCOPIC BILATERAL INGUINAL HERNIA REPAIR AND POSSIBLE APPENDECTOMY Dr Michaell Cowing Following.  F/U in 1 month

## 2021-05-07 ENCOUNTER — Other Ambulatory Visit: Payer: Self-pay

## 2021-05-07 ENCOUNTER — Encounter
Payer: No Typology Code available for payment source | Attending: Physical Medicine & Rehabilitation | Admitting: Registered Nurse

## 2021-05-07 ENCOUNTER — Encounter: Payer: Self-pay | Admitting: Registered Nurse

## 2021-05-07 VITALS — BP 123/78 | HR 81 | Temp 98.8°F | Ht 75.0 in | Wt 219.4 lb

## 2021-05-07 DIAGNOSIS — M961 Postlaminectomy syndrome, not elsewhere classified: Secondary | ICD-10-CM | POA: Diagnosis not present

## 2021-05-07 DIAGNOSIS — M25571 Pain in right ankle and joints of right foot: Secondary | ICD-10-CM

## 2021-05-07 DIAGNOSIS — M546 Pain in thoracic spine: Secondary | ICD-10-CM | POA: Diagnosis not present

## 2021-05-07 DIAGNOSIS — M542 Cervicalgia: Secondary | ICD-10-CM

## 2021-05-07 DIAGNOSIS — G8929 Other chronic pain: Secondary | ICD-10-CM | POA: Diagnosis present

## 2021-05-07 DIAGNOSIS — Z5181 Encounter for therapeutic drug level monitoring: Secondary | ICD-10-CM | POA: Diagnosis present

## 2021-05-07 DIAGNOSIS — M47817 Spondylosis without myelopathy or radiculopathy, lumbosacral region: Secondary | ICD-10-CM | POA: Diagnosis present

## 2021-05-07 DIAGNOSIS — M7061 Trochanteric bursitis, right hip: Secondary | ICD-10-CM | POA: Diagnosis present

## 2021-05-07 DIAGNOSIS — M5416 Radiculopathy, lumbar region: Secondary | ICD-10-CM

## 2021-05-07 DIAGNOSIS — M5412 Radiculopathy, cervical region: Secondary | ICD-10-CM

## 2021-05-07 DIAGNOSIS — Z79891 Long term (current) use of opiate analgesic: Secondary | ICD-10-CM | POA: Diagnosis present

## 2021-05-07 DIAGNOSIS — G894 Chronic pain syndrome: Secondary | ICD-10-CM | POA: Diagnosis present

## 2021-05-07 MED ORDER — OXYCODONE-ACETAMINOPHEN 5-325 MG PO TABS: 1.0000 | ORAL_TABLET | Freq: Two times a day (BID) | ORAL | 0 refills | Status: DC

## 2021-05-07 MED ORDER — XTAMPZA ER 18 MG PO C12A: 1.0000 | EXTENDED_RELEASE_CAPSULE | Freq: Two times a day (BID) | ORAL | 0 refills | Status: DC

## 2021-05-07 NOTE — Progress Notes (Signed)
Subjective:    Patient ID: David [name], male    DOB: Aug 09, 1976, 45 y.o.   MRN: [id number]  HPI: David [name] is a 45 y.o. male who returns for follow up appointment for chronic pain and medication refill. He states his pain is located in his neck radiating into his bilateral shoulders, left arm and left pinky with tingling. Also reports mid- back - lower back pain radiating into his bilateral lower extremities L>R, right hip pain and right ankle pain.  He rates his pain 5. His current exercise regime is walking and performing stretching exercises.   David [name] equivalent is  82.50 MME. He is also prescribed Diazepam  by Mauricio Po Np..We have discussed the black box warning of using opioids and benzodiazepines. I highlighted the dangers of using these drugs together and discussed the adverse events including respiratory suppression, overdose, cognitive impairment and importance of compliance with current regimen. We will continue to monitor and adjust as indicated.      Last UDS was Performed on 01/01/2021, it was consistent.    Pain Inventory Average Pain 5 Pain Right Now 5 My pain is intermittent, constant, sharp, burning, dull, stabbing, tingling, and aching  In the last 24 hours, has pain interfered with the following? General activity 5 Relation with others 5 Enjoyment of life 5 What TIME of day is your pain at its worst? morning , daytime, evening, and night Sleep (in general) Fair  Pain is worse with: walking, bending, sitting, inactivity, standing, and some activites Pain improves with: rest, heat/ice, therapy/exercise, pacing activities, medication, and TENS Relief from Meds: 4  Family History  Problem Relation Age of Onset   Arthritis Mother    Heart disease Mother    Diabetes Mother    Heart disease Father    Diabetes Father    Social History   Socioeconomic History   Marital status: Single    Spouse name: Not on file   Number of children:  Not on file   Years of education: Not on file   Highest education level: Not on file  Occupational History   Not on file  Tobacco Use   Smoking status: Never   Smokeless tobacco: Never  Vaping Use   Vaping Use: Never used  Substance and Sexual Activity   Alcohol use: No   Drug use: Never   Sexual activity: Not on file  Other Topics Concern   Not on file  Social History Narrative   Not on file   Social Determinants of Health   Financial Resource Strain: Not on file  Food Insecurity: Not on file  Transportation Needs: Not on file  Physical Activity: Not on file  Stress: Not on file  Social Connections: Not on file   Past Surgical History:  Procedure Laterality Date   ANTERIOR CERVICAL DECOMP/DISCECTOMY FUSION  02-15-2008  @[facility]    C4--5,  C6--7, C7--T1   COLONOSCOPY     INGUINAL HERNIA REPAIR Bilateral 12/13/2020   Procedure: LAPAROSCOPIC BILATERAL INGUINAL HERNIA REPAIR AND POSSIBLE APPENDECTOMY;  Surgeon: 02/10/2021, MD;  Location: [facility];  Service: General;  Laterality: Bilateral;  ERAS   Past Surgical History:  Procedure Laterality Date   ANTERIOR CERVICAL DECOMP/DISCECTOMY FUSION  02-15-2008  @[facility]    C4--5,  C6--7, C7--T1   COLONOSCOPY     INGUINAL HERNIA REPAIR Bilateral 12/13/2020   Procedure: LAPAROSCOPIC BILATERAL INGUINAL HERNIA REPAIR AND POSSIBLE APPENDECTOMY;  Surgeon: , MD;  Location: [facility] [place];  Service: General;  Laterality: Bilateral;  ERAS   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    Family history of adverse reaction to anesthesia    father--- ponv   GERD (gastroesophageal reflux disease)    occasional, watches diet   Greater trochanteric bursitis of right hip    History of kidney stones     Hypertension    Inguinal hernia, bilateral    Lumbar spondylosis    Nocturia    Peripheral neuropathy    bilateral lower legs and a little of left arm   Postlaminectomy syndrome, cervical region    Primary localized osteoarthrosis, lower leg    Radiculopathy, multiple sites in spine    cervical , thoracic, lumbar   Type 2 diabetes mellitus (HCC)    followed by pcp---  (12-11-2020 checks blood sugar 3 times wkly,  fasting sugar--- 125 )   Wears glasses    BP 123/78   Pulse 81   Temp 98.8 F (37.1 C)   Ht 6\' 3"  (1.905 m)   Wt 219 lb 6.4 oz (99.5 kg)   SpO2 97%   BMI 27.42 kg/m   Opioid Risk Score:   Fall Risk Score:  `1  Depression screen PHQ 2/9  Depression screen [facility] 2/9 03/05/2021 02/05/2021 12/04/2020 10/02/2020 07/11/2020 03/06/2020 03/03/2019  Decreased Interest 1 1 1  0 1 1 1   Down, Depressed, Hopeless 1 1 1  0 0 1 1  PHQ - 2 Score 2 2 2  0 1 2 2   Altered sleeping - - - - 2 - -  Tired, decreased energy - - - - 1 - -  Change in appetite - - - - 0 - -  Feeling bad or failure about yourself  - - - - 0 - -  Trouble concentrating - - - - 0 - -  Moving slowly or fidgety/restless - - - - 0 - -  Suicidal thoughts - - - - 0 - -  PHQ-9 Score - - - - 4 - -  Difficult doing work/chores - - - - - - -    Review of Systems  Musculoskeletal:  Positive for arthralgias, back pain, gait problem and neck pain.       Pain in the back, neck,legs and left arm  All other systems reviewed and are negative.     Objective:   Physical Exam Vitals and nursing note reviewed.  Constitutional:      Appearance: Normal appearance.  Neck:     Comments: Cervical Paraspinal Tenderness: C-5-C-6 Cardiovascular:     Rate and Rhythm: Normal rate and regular rhythm.     Pulses: Normal pulses.     Heart sounds: Normal heart sounds.  Pulmonary:     Effort: Pulmonary effort is normal.     Breath sounds: Normal breath sounds.  Musculoskeletal:     Cervical back: Normal range of motion and neck supple.      Comments: Normal Muscle Bulk and Muscle Testing Reveals:  Upper Extremities: Full ROM and Muscle Strength 5/5  Bilateral AC Joint Tenderness Thoracic and Lumbar Hypersensitivity Right Greater Trochanter Tenderness Lower Extremities: Full ROM and Muscle Strength 5/5  Arises from Table with ease Narrow Based  Gait     Skin:    General: Skin is  warm and dry.  Neurological:     Mental Status: He is alert and oriented to person, place, and time.  Psychiatric:        Mood and Affect: Mood normal.        Behavior: Behavior normal.         Assessment & Plan:  1. Cervical postlaminectomy syndrome, Hx of ACDF C4-5, C6-7, C7-T1, 02/2008. Continue current medication and alternate using heat and ice therapy. 05/07/2021 2. Spondylosis of L-spine . Continue current treatment regime. 05/07/2021 3. Chronic Pain Syndrome Refilled: Xtampza 18 mg one capsule every 12 hours for pain #60 and Oxycodone 5/325mg  one tablet twice a day, may take a extra tablet when pain is sever # 75.  Continue Voltaren Gel/ Flector Patch. 05/07/2021 We will continue the opioid monitoring program, this consists of regular clinic visits, examinations, urine drug screen, pill counts as well as use of [place] Controlled Substance Reporting system. A 12 month History has been reviewed on the [place] Controlled Substance Reporting System on 05/07/2021. 4. Diabetes with neuropathy. Continue current medication regimen with  Gabapentin. Keep blood sugars under tight control. Continue to monitor. 05/07/2021 5. Cervicalgia/Cervical Radiculopathy/ Thoracic Radiculopathy/ Lumbar Radiculopathy:Continue Gabapentin.05/07/2021 6. Right Greater Trochanter Bursitis: Continue Ice/Heat and Current medication regimen. 05/07/2021  7. Chronic Pain Syndrome/ Myofascial Pain: Continue Flector Patch/ Alternates with Voltaren Gel. 05/07/2021 8. Sacroiliac Pain: Continue HEP as Tolerated. Continue to Monitor.05/07/2021 9. Chronic Right Ankle  Pain: .Continue HEP as tolerated. Continue to Monitor. 05/07/2021. 10. Post-Op Pain:  No complaints Today.  S/P LAPAROSCOPIC BILATERAL INGUINAL HERNIA REPAIR AND POSSIBLE APPENDECTOMY Dr Michaell Cowing Following.     F/U in 1 month

## 2021-06-04 ENCOUNTER — Encounter: Payer: Self-pay | Admitting: Registered Nurse

## 2021-06-04 ENCOUNTER — Other Ambulatory Visit: Payer: Self-pay

## 2021-06-04 ENCOUNTER — Encounter
Payer: No Typology Code available for payment source | Attending: Physical Medicine & Rehabilitation | Admitting: Registered Nurse

## 2021-06-04 VITALS — BP 112/72 | HR 78 | Temp 98.7°F | Ht 75.0 in | Wt 221.6 lb

## 2021-06-04 DIAGNOSIS — M961 Postlaminectomy syndrome, not elsewhere classified: Secondary | ICD-10-CM | POA: Insufficient documentation

## 2021-06-04 DIAGNOSIS — G8929 Other chronic pain: Secondary | ICD-10-CM | POA: Insufficient documentation

## 2021-06-04 DIAGNOSIS — M5412 Radiculopathy, cervical region: Secondary | ICD-10-CM | POA: Insufficient documentation

## 2021-06-04 DIAGNOSIS — M545 Low back pain, unspecified: Secondary | ICD-10-CM | POA: Insufficient documentation

## 2021-06-04 DIAGNOSIS — M546 Pain in thoracic spine: Secondary | ICD-10-CM | POA: Diagnosis present

## 2021-06-04 DIAGNOSIS — M5416 Radiculopathy, lumbar region: Secondary | ICD-10-CM | POA: Insufficient documentation

## 2021-06-04 DIAGNOSIS — M2550 Pain in unspecified joint: Secondary | ICD-10-CM | POA: Diagnosis present

## 2021-06-04 DIAGNOSIS — Z79891 Long term (current) use of opiate analgesic: Secondary | ICD-10-CM | POA: Insufficient documentation

## 2021-06-04 DIAGNOSIS — Z79899 Other long term (current) drug therapy: Secondary | ICD-10-CM | POA: Insufficient documentation

## 2021-06-04 DIAGNOSIS — Z5181 Encounter for therapeutic drug level monitoring: Secondary | ICD-10-CM | POA: Diagnosis present

## 2021-06-04 DIAGNOSIS — M25551 Pain in right hip: Secondary | ICD-10-CM | POA: Insufficient documentation

## 2021-06-04 DIAGNOSIS — M549 Dorsalgia, unspecified: Secondary | ICD-10-CM | POA: Diagnosis present

## 2021-06-04 DIAGNOSIS — M25571 Pain in right ankle and joints of right foot: Secondary | ICD-10-CM | POA: Insufficient documentation

## 2021-06-04 DIAGNOSIS — M47817 Spondylosis without myelopathy or radiculopathy, lumbosacral region: Secondary | ICD-10-CM | POA: Insufficient documentation

## 2021-06-04 DIAGNOSIS — M7061 Trochanteric bursitis, right hip: Secondary | ICD-10-CM | POA: Diagnosis present

## 2021-06-04 DIAGNOSIS — G894 Chronic pain syndrome: Secondary | ICD-10-CM | POA: Insufficient documentation

## 2021-06-04 DIAGNOSIS — M542 Cervicalgia: Secondary | ICD-10-CM | POA: Diagnosis present

## 2021-06-04 MED ORDER — XTAMPZA ER 18 MG PO C12A: 1.0000 | EXTENDED_RELEASE_CAPSULE | Freq: Two times a day (BID) | ORAL | 0 refills | Status: DC

## 2021-06-04 MED ORDER — OXYCODONE-ACETAMINOPHEN 5-325 MG PO TABS: 1.0000 | ORAL_TABLET | Freq: Two times a day (BID) | ORAL | 0 refills | Status: DC

## 2021-06-04 NOTE — Progress Notes (Signed)
Subjective:    Patient ID: David [name], male    DOB: Aug 01, 1976, 45 y.o.   MRN: [id number]  HPI: David [name] is a 45 y.o. male who returns for follow up appointment for chronic pain and medication refill. He states his  pain is located in his neck radiating into his bilateral shoulders, left arm with tingling and numbness, upper- lower back pain radiating into his bilateral lower extremities. Also reports generalized joint pain, right hip and right ankle pain.He rates his pain 5. His current exercise regime is walking and performing stretching exercises.   David [name] Morphine equivalent is 60.00 MME.  He is also prescribed Diazepam by Mauricio Po Np .We have discussed the black box warning of using opioids and benzodiazepines. I highlighted the dangers of using these drugs together and discussed the adverse events including respiratory suppression, overdose, cognitive impairment and importance of compliance with current regimen. We will continue to monitor and adjust as indicated.   Last UDS was Performed on 01/01/2021, it was consistent.     Pain Inventory Average Pain 5 Pain Right Now 5 My pain is intermittent, constant, sharp, burning, dull, stabbing, tingling, and aching  In the last 24 hours, has pain interfered with the following? General activity 6 Relation with others 5 Enjoyment of life 6 What TIME of day is your pain at its worst? morning , daytime, evening, and night Sleep (in general) Fair  Pain is worse with: walking, bending, sitting, inactivity, standing, and some activites Pain improves with: rest, heat/ice, therapy/exercise, pacing activities, medication, and TENS Relief from Meds: 4  Family History  Problem Relation Age of Onset   Arthritis Mother    Heart disease Mother    Diabetes Mother    Heart disease Father    Diabetes Father    Social History   Socioeconomic History   Marital status: Single    Spouse name: Not on file   Number of  children: Not on file   Years of education: Not on file   Highest education level: Not on file  Occupational History   Not on file  Tobacco Use   Smoking status: Never   Smokeless tobacco: Never  Vaping Use   Vaping Use: Never used  Substance and Sexual Activity   Alcohol use: No   Drug use: Never   Sexual activity: Not on file  Other Topics Concern   Not on file  Social History Narrative   Not on file   Social Determinants of Health   Financial Resource Strain: Not on file  Food Insecurity: Not on file  Transportation Needs: Not on file  Physical Activity: Not on file  Stress: Not on file  Social Connections: Not on file   Past Surgical History:  Procedure Laterality Date   ANTERIOR CERVICAL DECOMP/DISCECTOMY FUSION  02-15-2008  @[facility]    C4--5,  C6--7, C7--T1   COLONOSCOPY     INGUINAL HERNIA REPAIR Bilateral 12/13/2020   Procedure: LAPAROSCOPIC BILATERAL INGUINAL HERNIA REPAIR AND POSSIBLE APPENDECTOMY;  Surgeon: 02/10/2021, MD;  Location: [facility];  Service: General;  Laterality: Bilateral;  ERAS   Past Surgical History:  Procedure Laterality Date   ANTERIOR CERVICAL DECOMP/DISCECTOMY FUSION  02-15-2008  @[facility]    C4--5,  C6--7, C7--T1   COLONOSCOPY     INGUINAL HERNIA REPAIR Bilateral 12/13/2020   Procedure: LAPAROSCOPIC BILATERAL INGUINAL HERNIA REPAIR AND POSSIBLE APPENDECTOMY;  Surgeon: , MD;  Location: [facility];  Service: General;  Laterality:  Bilateral;  ERAS   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    Family history of adverse reaction to anesthesia    father--- ponv   GERD (gastroesophageal reflux disease)    occasional, watches diet   Greater trochanteric bursitis of right hip    History of kidney stones     Hypertension    Inguinal hernia, bilateral    Lumbar spondylosis    Nocturia    Peripheral neuropathy    bilateral lower legs and a little of left arm   Postlaminectomy syndrome, cervical region    Primary localized osteoarthrosis, lower leg    Radiculopathy, multiple sites in spine    cervical , thoracic, lumbar   Type 2 diabetes mellitus (HCC)    followed by pcp---  (12-11-2020 checks blood sugar 3 times wkly,  fasting sugar--- 125 )   Wears glasses    BP 112/72   Pulse 78   Temp 98.7 F (37.1 C)   Ht 6\' 3"  (1.905 m)   Wt 221 lb 9.6 oz (100.5 kg)   SpO2 98%   BMI 27.70 kg/m   Opioid Risk Score:   Fall Risk Score:  `1  Depression screen PHQ 2/9  Depression screen [facility] 2/9 06/04/2021 05/07/2021 03/05/2021 02/05/2021 12/04/2020 10/02/2020 07/11/2020  Decreased Interest 1 1 1 1 1  0 1  Down, Depressed, Hopeless 1 1 1 1 1  0 0  PHQ - 2 Score 2 2 2 2 2  0 1  Altered sleeping - - - - - - 2  Tired, decreased energy - - - - - - 1  Change in appetite - - - - - - 0  Feeling bad or failure about yourself  - - - - - - 0  Trouble concentrating - - - - - - 0  Moving slowly or fidgety/restless - - - - - - 0  Suicidal thoughts - - - - - - 0  PHQ-9 Score - - - - - - 4  Difficult doing work/chores - - - - - - -     Review of Systems  Constitutional: Negative.   HENT: Negative.    Eyes: Negative.   Respiratory: Negative.    Cardiovascular: Negative.   Gastrointestinal: Negative.   Endocrine: Negative.   Genitourinary: Negative.   Musculoskeletal:  Positive for arthralgias, back pain, gait problem, myalgias and neck pain.  Skin: Negative.   Allergic/Immunologic: Negative.   Hematological: Negative.   Psychiatric/Behavioral:  Positive for dysphoric mood.   All other systems reviewed and are negative.     Objective:   Physical Exam Vitals and nursing note reviewed.  Constitutional:      Appearance: Normal appearance.  Neck:     Comments: Cervical Paraspinal Tenderness:  C-5-C-6 Cardiovascular:     Rate and Rhythm: Normal rate and regular rhythm.     Pulses: Normal pulses.     Heart sounds: Normal heart sounds.  Pulmonary:     Effort: Pulmonary effort is normal.     Breath sounds: Normal breath sounds.  Musculoskeletal:     Cervical back: Normal range of motion and neck supple.     Comments: Normal Muscle Bulk and Muscle Testing Reveals:  Upper Extremities: Full ROM and Muscle Strength 5/5 Bilateral AC Joint Tenderness  Thoracic and Lumbar Hypersensitivity Right Greater Trochanter Tenderness Lower Extremities: Full ROM and Muscle Strength 5/5 Arises from chair with ease Narrow Based  Gait     Skin:    General: Skin is warm and dry.  Neurological:     Mental Status: He is alert and oriented to person, place, and time.  Psychiatric:        Mood and Affect: Mood normal.        Behavior: Behavior normal.         Assessment & Plan:  1. Cervical postlaminectomy syndrome, Hx of ACDF C4-5, C6-7, C7-T1, 02/2008. Continue current medication and alternate using heat and ice therapy. 06/04/2021 2. Spondylosis of L-spine . Continue current treatment regime. 06/04/2021 3. Chronic Pain Syndrome Refilled: Xtampza 18 mg one capsule every 12 hours for pain #60 and Oxycodone 5/325mg  one tablet twice a day, may take a extra tablet when pain is sever # 75.  Continue Voltaren Gel/ Flector Patch. 06/04/2021 We will continue the opioid monitoring program, this consists of regular clinic visits, examinations, urine drug screen, pill counts as well as use of [place] Controlled Substance Reporting system. A 12 month History has been reviewed on the [place] Controlled Substance Reporting System on 06/04/2021. 4. Diabetes with neuropathy. Continue current medication regimen with  Gabapentin. Keep blood sugars under tight control. Continue to monitor. 06/04/2021 5. Cervicalgia/Cervical Radiculopathy/ Thoracic Radiculopathy/ Lumbar Radiculopathy:Continue  Gabapentin.06/04/2021 6. Right Greater Trochanter Bursitis: Continue Ice/Heat and Current medication regimen. 06/04/2021  7. Chronic Pain Syndrome/ Myofascial Pain: Continue Flector Patch/ Alternates with Voltaren Gel. 06/04/2021 8. Sacroiliac Pain: Continue HEP as Tolerated. Continue to Monitor.06/04/2021 9. Chronic Right Ankle Pain: .Continue HEP as tolerated. Continue to Monitor. 06/04/2021. 10. Post-Op Pain:  No complaints Today.  S/P LAPAROSCOPIC BILATERAL INGUINAL HERNIA REPAIR AND POSSIBLE APPENDECTOMY Dr Michaell Cowing Following.   11. Polyarthralgia: Continue HEP as Tolerated. Continue to monitor.  F/U in 1 month

## 2021-07-02 ENCOUNTER — Other Ambulatory Visit: Payer: Self-pay

## 2021-07-02 ENCOUNTER — Encounter: Payer: Self-pay | Admitting: Registered Nurse

## 2021-07-02 ENCOUNTER — Encounter: Payer: No Typology Code available for payment source | Admitting: Registered Nurse

## 2021-07-02 VITALS — BP 123/77 | HR 92 | Temp 98.2°F | Ht 75.0 in | Wt 221.0 lb

## 2021-07-02 DIAGNOSIS — G8929 Other chronic pain: Secondary | ICD-10-CM

## 2021-07-02 DIAGNOSIS — M25551 Pain in right hip: Secondary | ICD-10-CM

## 2021-07-02 DIAGNOSIS — M5412 Radiculopathy, cervical region: Secondary | ICD-10-CM

## 2021-07-02 DIAGNOSIS — G894 Chronic pain syndrome: Secondary | ICD-10-CM | POA: Diagnosis not present

## 2021-07-02 DIAGNOSIS — M47817 Spondylosis without myelopathy or radiculopathy, lumbosacral region: Secondary | ICD-10-CM

## 2021-07-02 DIAGNOSIS — Z5181 Encounter for therapeutic drug level monitoring: Secondary | ICD-10-CM | POA: Diagnosis not present

## 2021-07-02 DIAGNOSIS — M25571 Pain in right ankle and joints of right foot: Secondary | ICD-10-CM

## 2021-07-02 DIAGNOSIS — M549 Dorsalgia, unspecified: Secondary | ICD-10-CM

## 2021-07-02 DIAGNOSIS — M961 Postlaminectomy syndrome, not elsewhere classified: Secondary | ICD-10-CM | POA: Diagnosis not present

## 2021-07-02 DIAGNOSIS — M7061 Trochanteric bursitis, right hip: Secondary | ICD-10-CM

## 2021-07-02 DIAGNOSIS — Z79899 Other long term (current) drug therapy: Secondary | ICD-10-CM | POA: Diagnosis not present

## 2021-07-02 DIAGNOSIS — M545 Low back pain, unspecified: Secondary | ICD-10-CM | POA: Diagnosis not present

## 2021-07-02 DIAGNOSIS — M546 Pain in thoracic spine: Secondary | ICD-10-CM

## 2021-07-02 DIAGNOSIS — M542 Cervicalgia: Secondary | ICD-10-CM

## 2021-07-02 DIAGNOSIS — M5416 Radiculopathy, lumbar region: Secondary | ICD-10-CM

## 2021-07-02 MED ORDER — OXYCODONE-ACETAMINOPHEN 5-325 MG PO TABS: 1.0000 | ORAL_TABLET | Freq: Two times a day (BID) | ORAL | 0 refills | Status: DC

## 2021-07-02 MED ORDER — XTAMPZA ER 18 MG PO C12A: 1.0000 | EXTENDED_RELEASE_CAPSULE | Freq: Two times a day (BID) | ORAL | 0 refills | Status: DC

## 2021-07-02 NOTE — Progress Notes (Signed)
Subjective:    Patient ID: David [name], male    DOB: Oct 08, 1976, 45 y.o.   MRN: [id number]  HPI: David [name] is a 45 y.o. male who returns for follow up appointment for chronic pain and medication refill. He states his pain is located in his neck mainly right side radiating into his left shoulder, , left arm, left mid- finger and left pinky with tingling and numbness. Also reports upper- lower back pain radiating into his bilateral lower extremities, right hip pain and right ankle pain. He rates his pain 5. His current exercise regime is walking and performing stretching exercises.   [name] Morphine equivalent is 78.75 MME. He  is also prescribed Diazepam by Mauricio Po Np .We have discussed the black box warning of using opioids and benzodiazepines. I highlighted the dangers of using these drugs together and discussed the adverse events including respiratory suppression, overdose, cognitive impairment and importance of compliance with current regimen. We will continue to monitor and adjust as indicated.   UDS was ordered today.  .      Pain Inventory Average Pain 5 Pain Right Now 5 My pain is intermittent, constant, sharp, burning, dull, stabbing, tingling, and aching  In the last 24 hours, has pain interfered with the following? General activity 5 Relation with others 5 Enjoyment of life 5 What TIME of day is your pain at its worst? morning , daytime, evening, and night Sleep (in general) Fair  Pain is worse with: walking, bending, sitting, inactivity, standing, and some activites Pain improves with: rest, heat/ice, therapy/exercise, pacing activities, medication, and TENS Relief from Meds: 5  Family History  Problem Relation Age of Onset   Arthritis Mother    Heart disease Mother    Diabetes Mother    Heart disease Father    Diabetes Father    Social History   Socioeconomic History   Marital status: Single    Spouse name: Not on file   Number of children:  Not on file   Years of education: Not on file   Highest education level: Not on file  Occupational History   Not on file  Tobacco Use   Smoking status: Never   Smokeless tobacco: Never  Vaping Use   Vaping Use: Never used  Substance and Sexual Activity   Alcohol use: No   Drug use: Never   Sexual activity: Not on file  Other Topics Concern   Not on file  Social History Narrative   Not on file   Social Determinants of Health   Financial Resource Strain: Not on file  Food Insecurity: Not on file  Transportation Needs: Not on file  Physical Activity: Not on file  Stress: Not on file  Social Connections: Not on file   Past Surgical History:  Procedure Laterality Date   ANTERIOR CERVICAL DECOMP/DISCECTOMY FUSION  02-15-2008  @[facility]    C4--5,  C6--7, C7--T1   COLONOSCOPY     INGUINAL HERNIA REPAIR Bilateral 12/13/2020   Procedure: LAPAROSCOPIC BILATERAL INGUINAL HERNIA REPAIR AND POSSIBLE APPENDECTOMY;  Surgeon: 02/10/2021, MD;  Location: [facility] [place];  Service: General;  Laterality: Bilateral;  ERAS   Past Surgical History:  Procedure Laterality Date   ANTERIOR CERVICAL DECOMP/DISCECTOMY FUSION  02-15-2008  @[facility]    C4--5,  C6--7, C7--T1   COLONOSCOPY     INGUINAL HERNIA REPAIR Bilateral 12/13/2020   Procedure: LAPAROSCOPIC BILATERAL INGUINAL HERNIA REPAIR AND POSSIBLE APPENDECTOMY;  Surgeon: , MD;  Location: [facility];  Service: General;  Laterality: Bilateral;  ERAS   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    Family history of adverse reaction to anesthesia    father--- ponv   GERD (gastroesophageal reflux disease)    occasional, watches diet   Greater trochanteric bursitis of right hip    History of kidney stones     Hypertension    Inguinal hernia, bilateral    Lumbar spondylosis    Nocturia    Peripheral neuropathy    bilateral lower legs and a little of left arm   Postlaminectomy syndrome, cervical region    Primary localized osteoarthrosis, lower leg    Radiculopathy, multiple sites in spine    cervical , thoracic, lumbar   Type 2 diabetes mellitus (HCC)    followed by pcp---  (12-11-2020 checks blood sugar 3 times wkly,  fasting sugar--- 125 )   Wears glasses    BP 123/77   Pulse 92   Temp 98.2 F (36.8 C)   Ht 6\' 3"  (1.905 m)   Wt 221 lb (100.2 kg)   SpO2 97%   BMI 27.62 kg/m   Opioid Risk Score:   Fall Risk Score:  `1  Depression screen PHQ 2/9  Depression screen [facility] 2/9 06/04/2021 05/07/2021 03/05/2021 02/05/2021 12/04/2020 10/02/2020 07/11/2020  Decreased Interest 1 1 1 1 1  0 1  Down, Depressed, Hopeless 1 1 1 1 1  0 0  PHQ - 2 Score 2 2 2 2 2  0 1  Altered sleeping - - - - - - 2  Tired, decreased energy - - - - - - 1  Change in appetite - - - - - - 0  Feeling bad or failure about yourself  - - - - - - 0  Trouble concentrating - - - - - - 0  Moving slowly or fidgety/restless - - - - - - 0  Suicidal thoughts - - - - - - 0  PHQ-9 Score - - - - - - 4  Difficult doing work/chores - - - - - - -    Review of Systems  Constitutional: Negative.   HENT: Negative.    Eyes: Negative.   Respiratory: Negative.    Cardiovascular: Negative.   Gastrointestinal: Negative.   Endocrine: Negative.   Genitourinary: Negative.   Musculoskeletal:  Positive for back pain, neck pain and neck stiffness.  Skin: Negative.   Allergic/Immunologic: Negative.   Neurological:        Tingling  Hematological: Negative.   Psychiatric/Behavioral: Negative.    All other systems reviewed and are negative.     Objective:   Physical Exam Vitals and nursing note reviewed.  Constitutional:      Appearance: Normal appearance.  Neck:     Comments: Cervical Paraspinal Tenderness: C-5-C-6 Cardiovascular:      Rate and Rhythm: Normal rate and regular rhythm.     Pulses: Normal pulses.     Heart sounds: Normal heart sounds.  Pulmonary:     Effort: Pulmonary effort is normal.     Breath sounds: Normal breath sounds.  Musculoskeletal:     Cervical back: Normal range of motion and neck supple.     Comments: Normal Muscle Bulk and Muscle Testing Reveals:  Upper Extremities: Full ROM  and Muscle Strength 5/5 Bilateral AC Joint Tenderness Thoracic and Lumbar Hypersensitivity  Right Greater Trochanter Tenderness Lower Extremities: Full ROM and Muscle Strength 5/5 Arises from chair with ease Narrow Based Gait     Skin:    General: Skin is warm and dry.  Neurological:     Mental Status: He is alert and oriented to person, place, and time.  Psychiatric:        Mood and Affect: Mood normal.        Behavior: Behavior normal.         Assessment & Plan:  Acute exacerbation of Chronic Low Back Pain: RX: Lumbar X-ray. Continue to monitor Acute Thoracic Back Pain: RX: Thoracic X-ray 3. Cervical postlaminectomy syndrome, Hx of ACDF C4-5, C6-7, C7-T1, 02/2008. Continue current medication and alternate using heat and ice therapy. 07/02/2021 4. Spondylosis of L-spine . Continue current treatment regime. 07/02/2021 5. Chronic Pain Syndrome Refilled: Xtampza 18 mg one capsule every 12 hours for pain #60 and Oxycodone 5/325mg  one tablet twice a day, may take a extra tablet when pain is sever # 75.  Continue Voltaren Gel/ Flector Patch. 07/02/2021 We will continue the opioid monitoring program, this consists of regular clinic visits, examinations, urine drug screen, pill counts as well as use of [place] Controlled Substance Reporting system. A 12 month History has been reviewed on the [place] Controlled Substance Reporting System on 07/02/2021. 6. Diabetes with neuropathy. Continue current medication regimen with  Gabapentin. Keep blood sugars under tight control. Continue to monitor. 07/02/2021 7.  Cervicalgia/Cervical Radiculopathy/ Thoracic Radiculopathy/ Lumbar Radiculopathy:Continue Gabapentin.07/02/2021 8. Right Greater Trochanter Bursitis: Continue Ice/Heat and Current medication regimen. 07/02/2021  9. Chronic Pain Syndrome/ Myofascial Pain: Continue Flector Patch/ Alternates with Voltaren Gel. 07/02/2021 10. Sacroiliac Pain: Continue HEP as Tolerated. Continue to Monitor.07/02/2021 11. Chronic Right Ankle Pain: .Continue HEP as tolerated. Continue to Monitor. 07/02/2021. 12. Post-Op Pain:  No complaints Today. 07/02/2021 S/P LAPAROSCOPIC BILATERAL INGUINAL HERNIA REPAIR AND POSSIBLE APPENDECTOMY Dr Michaell Cowing Following.   13. Polyarthralgia: Continue HEP as Tolerated. Continue to monitor. 07/02/2021  F/U in 1 month

## 2021-07-04 ENCOUNTER — Ambulatory Visit
Admission: RE | Admit: 2021-07-04 | Discharge: 2021-07-04 | Disposition: A | Discharge status: Home / Self Care | Payer: No Typology Code available for payment source | Source: Ambulatory Visit | Attending: Registered Nurse | Admitting: Registered Nurse

## 2021-07-04 ENCOUNTER — Other Ambulatory Visit: Payer: Self-pay

## 2021-07-08 LAB — TOXASSURE SELECT,+ANTIDEPR,UR

## 2021-07-10 ENCOUNTER — Telehealth: Payer: Self-pay | Admitting: *Deleted

## 2021-07-10 NOTE — Telephone Encounter (Signed)
Urine drug screen for this encounter is consistent for prescribed medication 

## 2021-07-30 ENCOUNTER — Other Ambulatory Visit: Payer: Self-pay

## 2021-07-30 ENCOUNTER — Encounter: Payer: Self-pay | Admitting: Registered Nurse

## 2021-07-30 ENCOUNTER — Encounter
Payer: No Typology Code available for payment source | Attending: Physical Medicine & Rehabilitation | Admitting: Registered Nurse

## 2021-07-30 VITALS — BP 121/85 | HR 85 | Temp 98.5°F | Ht 75.0 in | Wt 225.0 lb

## 2021-07-30 DIAGNOSIS — Z79899 Other long term (current) drug therapy: Secondary | ICD-10-CM | POA: Insufficient documentation

## 2021-07-30 DIAGNOSIS — M542 Cervicalgia: Secondary | ICD-10-CM | POA: Insufficient documentation

## 2021-07-30 DIAGNOSIS — M5416 Radiculopathy, lumbar region: Secondary | ICD-10-CM | POA: Insufficient documentation

## 2021-07-30 DIAGNOSIS — G8929 Other chronic pain: Secondary | ICD-10-CM | POA: Diagnosis present

## 2021-07-30 DIAGNOSIS — M7061 Trochanteric bursitis, right hip: Secondary | ICD-10-CM | POA: Diagnosis present

## 2021-07-30 DIAGNOSIS — M961 Postlaminectomy syndrome, not elsewhere classified: Secondary | ICD-10-CM | POA: Diagnosis not present

## 2021-07-30 DIAGNOSIS — M25571 Pain in right ankle and joints of right foot: Secondary | ICD-10-CM | POA: Insufficient documentation

## 2021-07-30 DIAGNOSIS — Z5181 Encounter for therapeutic drug level monitoring: Secondary | ICD-10-CM | POA: Diagnosis present

## 2021-07-30 DIAGNOSIS — M546 Pain in thoracic spine: Secondary | ICD-10-CM | POA: Diagnosis not present

## 2021-07-30 DIAGNOSIS — G894 Chronic pain syndrome: Secondary | ICD-10-CM | POA: Insufficient documentation

## 2021-07-30 DIAGNOSIS — M47817 Spondylosis without myelopathy or radiculopathy, lumbosacral region: Secondary | ICD-10-CM | POA: Insufficient documentation

## 2021-07-30 DIAGNOSIS — M5412 Radiculopathy, cervical region: Secondary | ICD-10-CM | POA: Insufficient documentation

## 2021-07-30 MED ORDER — XTAMPZA ER 18 MG PO C12A: 1.0000 | EXTENDED_RELEASE_CAPSULE | Freq: Two times a day (BID) | ORAL | 0 refills | Status: DC

## 2021-07-30 MED ORDER — OXYCODONE-ACETAMINOPHEN 5-325 MG PO TABS: 1.0000 | ORAL_TABLET | Freq: Two times a day (BID) | ORAL | 0 refills | Status: DC

## 2021-07-30 NOTE — Progress Notes (Signed)
Subjective:    Patient ID: David [name], male    DOB: [birth year], 45 y.o.   MRN: [id number]  HPI: David [name] is a 45 y.o. male who returns for follow up appointment for chronic pain and medication refill. He states his pain is located in his  neck radiating into his left shoulder and left arm with tingling and numbness, mid- lower back pain radiating into his bilateral lower extremities L>R and bilateral lower extremities. Also reports right hip and right ankle pain. He rates his pain 5. His current exercise regime is walking and performing stretching exercises.  David [name] Morphine equivalent is 78.75 MME. He is also prescribed Diazepam by [facility].We have discussed the black box warning of using opioids and benzodiazepines. I highlighted the dangers of using these drugs together and discussed the adverse events including respiratory suppression, overdose, cognitive impairment and importance of compliance with current regimen. We will continue to monitor and adjust as indicated.   Last UDS was Performed on 07/02/2021, it was consistent.      Pain Inventory Average Pain 5 Pain Right Now 5 My pain is intermittent, constant, sharp, burning, dull, stabbing, tingling, and aching  In the last 24 hours, has pain interfered with the following? General activity 5 Relation with others 5 Enjoyment of life 5 What TIME of day is your pain at its worst? morning , daytime, evening, and night Sleep (in general) Fair  Pain is worse with: walking, bending, sitting, inactivity, standing, and some activites Pain improves with: rest, heat/ice, therapy/exercise, pacing activities, medication, and TENS Relief from Meds: 5  Family History  Problem Relation Age of Onset   Arthritis Mother    Heart disease Mother    Diabetes Mother    Heart disease Father    Diabetes Father    Social History   Socioeconomic History   Marital status: Single    Spouse name: Not on file   Number of  children: Not on file   Years of education: Not on file   Highest education level: Not on file  Occupational History   Not on file  Tobacco Use   Smoking status: Never   Smokeless tobacco: Never  Vaping Use   Vaping Use: Never used  Substance and Sexual Activity   Alcohol use: No   Drug use: Never   Sexual activity: Not on file  Other Topics Concern   Not on file  Social History Narrative   Not on file   Social Determinants of Health   Financial Resource Strain: Not on file  Food Insecurity: Not on file  Transportation Needs: Not on file  Physical Activity: Not on file  Stress: Not on file  Social Connections: Not on file   Past Surgical History:  Procedure Laterality Date   ANTERIOR CERVICAL DECOMP/DISCECTOMY FUSION  02-15-2008  @[facility]    C4--5,  C6--7, C7--T1   COLONOSCOPY     INGUINAL HERNIA REPAIR Bilateral 12/13/2020   Procedure: LAPAROSCOPIC BILATERAL INGUINAL HERNIA REPAIR AND POSSIBLE APPENDECTOMY;  Surgeon: 02/10/2021, MD;  Location: [facility];  Service: General;  Laterality: Bilateral;  ERAS   Past Surgical History:  Procedure Laterality Date   ANTERIOR CERVICAL DECOMP/DISCECTOMY FUSION  02-15-2008  @[facility]    C4--5,  C6--7, C7--T1   COLONOSCOPY     INGUINAL HERNIA REPAIR Bilateral 12/13/2020   Procedure: LAPAROSCOPIC BILATERAL INGUINAL HERNIA REPAIR AND POSSIBLE APPENDECTOMY;  Surgeon: , MD;  Location: [facility];  Service: General;  Laterality: Bilateral;  ERAS   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    Family history of adverse reaction to anesthesia    father--- ponv   GERD (gastroesophageal reflux disease)    occasional, watches diet   Greater trochanteric bursitis of right hip    History of kidney stones     Hypertension    Inguinal hernia, bilateral    Lumbar spondylosis    Nocturia    Peripheral neuropathy    bilateral lower legs and a little of left arm   Postlaminectomy syndrome, cervical region    Primary localized osteoarthrosis, lower leg    Radiculopathy, multiple sites in spine    cervical , thoracic, lumbar   Type 2 diabetes mellitus (HCC)    followed by pcp---  (12-11-2020 checks blood sugar 3 times wkly,  fasting sugar--- 125 )   Wears glasses    BP 121/85   Pulse 85   Temp 98.5 F (36.9 C)   Ht 6\' 3"  (1.905 m)   Wt 225 lb (102.1 kg)   SpO2 97%   BMI 28.12 kg/m   Opioid Risk Score:   Fall Risk Score:  `1  Depression screen PHQ 2/9  Depression screen [facility] 2/9 06/04/2021 05/07/2021 03/05/2021 02/05/2021 12/04/2020 10/02/2020 07/11/2020  Decreased Interest 1 1 1 1 1  0 1  Down, Depressed, Hopeless 1 1 1 1 1  0 0  PHQ - 2 Score 2 2 2 2 2  0 1  Altered sleeping - - - - - - 2  Tired, decreased energy - - - - - - 1  Change in appetite - - - - - - 0  Feeling bad or failure about yourself  - - - - - - 0  Trouble concentrating - - - - - - 0  Moving slowly or fidgety/restless - - - - - - 0  Suicidal thoughts - - - - - - 0  PHQ-9 Score - - - - - - 4  Difficult doing work/chores - - - - - - -     Review of Systems  Musculoskeletal:  Positive for arthralgias, back pain and neck pain. Negative for gait problem.       Pain all over the left hand side of body & right leg  All other systems reviewed and are negative.     Objective:   Physical Exam Vitals and nursing note reviewed.  Constitutional:      Appearance: Normal appearance.  Cardiovascular:     Rate and Rhythm: Normal rate and regular rhythm.     Pulses: Normal pulses.     Heart sounds: Normal heart sounds.  Pulmonary:     Effort: Pulmonary effort is normal.     Breath sounds: Normal breath sounds.  Musculoskeletal:     Cervical back: Normal range of motion and neck supple.     Comments: Normal Muscle Bulk and Muscle  Testing Reveals:  Upper Extremities: Full ROM and Muscle Strength  5/5 Bilateral AC Joint Tenderness] Thoracic and Lumbar Hypersensitivity Right Greater Trochanter Tenderness Lower Extremities: Full ROM and Muscle Strength 5/5 Arises from chair with ease Narrow Based  Gait     Skin:    General: Skin is warm and dry.  Neurological:  Mental Status: He is alert and oriented to person, place, and time.  Psychiatric:        Mood and Affect: Mood normal.        Behavior: Behavior normal.         Assessment & Plan:  1. Cervical postlaminectomy syndrome, Hx of ACDF C4-5, C6-7, C7-T1, 02/2008. Continue current medication and alternate using heat and ice therapy. 07/30/2021 2. Spondylosis of L-spine . Continue current treatment regime. 07/30/2021 3. Chronic Pain Syndrome Refilled: Xtampza 18 mg one capsule every 12 hours for pain #60 and Oxycodone 5/325mg  one tablet twice a day, may take a extra tablet when pain is sever # 75.  Continue Voltaren Gel/ Flector Patch. 07/30/2021 We will continue the opioid monitoring program, this consists of regular clinic visits, examinations, urine drug screen, pill counts as well as use of [place] Controlled Substance Reporting system. A 12 month History has been reviewed on the [place] Controlled Substance Reporting System on 07/30/2021. 4. Diabetes with neuropathy. Continue current medication regimen with  Gabapentin. Keep blood sugars under tight control. Continue to monitor. 07/30/2021 5. Cervicalgia/Cervical Radiculopathy/ Thoracic Radiculopathy/ Lumbar Radiculopathy:Continue Gabapentin.07/30/2021 6. Right Greater Trochanter Bursitis: Continue Ice/Heat and Current medication regimen. 07/30/2021 7. Chronic Pain Syndrome/ Myofascial Pain: Continue Flector Patch/ Alternates with Voltaren Gel. 07/30/2021 8. Sacroiliac Pain: Continue HEP as Tolerated. Continue to Monitor.07/30/2021 9. Chronic Right Ankle Pain: .Continue HEP as tolerated.  Continue to Monitor. 07/30/2021. 10. Post-Op Pain:  No complaints Today.  S/P LAPAROSCOPIC BILATERAL INGUINAL HERNIA REPAIR AND POSSIBLE APPENDECTOMY Dr Michaell Cowing Following.   11. Polyarthralgia: Continue HEP as Tolerated. Continue to monitor. 07/30/2021  F/U in 1 month

## 2021-08-20 ENCOUNTER — Other Ambulatory Visit: Payer: Self-pay

## 2021-08-20 ENCOUNTER — Ambulatory Visit (INDEPENDENT_AMBULATORY_CARE_PROVIDER_SITE_OTHER): Payer: No Typology Code available for payment source | Admitting: Podiatry

## 2021-08-20 ENCOUNTER — Ambulatory Visit (INDEPENDENT_AMBULATORY_CARE_PROVIDER_SITE_OTHER): Payer: No Typology Code available for payment source

## 2021-08-20 DIAGNOSIS — S9001XA Contusion of right ankle, initial encounter: Secondary | ICD-10-CM

## 2021-08-20 DIAGNOSIS — M76821 Posterior tibial tendinitis, right leg: Secondary | ICD-10-CM

## 2021-08-20 MED ORDER — MELOXICAM 15 MG PO TABS: 15.0000 mg | ORAL_TABLET | Freq: Every day | ORAL | 1 refills | Status: DC

## 2021-08-20 NOTE — Progress Notes (Signed)
   HPI: 45 y.o. male presenting today for evaluation of chronic ankle pain to the right lower extremity has been going on for about 3 years now.  Patient states that he had a job about 3 years ago which required him to be on his feet throughout the day and he began to experience pain.  Over the past 3 years it has slowly increased.  He has not done anything for treatment.  He is currently on chronic pain management for back problems.  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    Family history of adverse reaction to anesthesia    father--- ponv   GERD (gastroesophageal reflux disease)    occasional, watches diet   Greater trochanteric bursitis of right hip    History of kidney stones    Hypertension    Inguinal hernia, bilateral    Lumbar spondylosis    Nocturia    Peripheral neuropathy    bilateral lower legs and a little of left arm   Postlaminectomy syndrome, cervical region    Primary localized osteoarthrosis, lower leg    Radiculopathy, multiple sites in spine    cervical , thoracic, lumbar   Type 2 diabetes mellitus (HCC)    followed by pcp---  (12-11-2020 checks blood sugar 3 times wkly,  fasting sugar--- 125 )   Wears glasses      Physical Exam: General: The patient is alert and oriented x3 in no acute distress.  Dermatology: Skin is warm, dry and supple bilateral lower extremities. Negative for open lesions or macerations.  Vascular: Palpable pedal pulses bilaterally. No edema or erythema noted. Capillary refill within normal limits.  Neurological: Epicritic and protective threshold diminished bilaterally.   Musculoskeletal Exam: No pedal deformities noted.  There is some tenderness and pressure with palpation along the posterior tibial tendon right lower extremity just  posterior to the medial malleolus  Radiographic Exam:  Normal osseous mineralization. Joint spaces preserved. No fracture/dislocation/boney destruction.    Assessment: 1.  Posterior tibial tendinitis right   Plan of Care:  1. Patient evaluated. X-Rays reviewed.  2. No steroid injections.  Patient states that it causes his blood sugar to increase drastically 3.  Prescription for meloxicam 15 mg daily 4.  Continue chronic pain management 5.  Ankle brace dispensed.  Wear daily 6.  Return to clinic as needed      Felecia Shelling, DPM [facility]  Dr. Felecia Shelling, DPM    [address] [place], [place] [address]                Office [phone]  Fax [phone]

## 2021-08-29 ENCOUNTER — Other Ambulatory Visit: Payer: Self-pay | Admitting: Registered Nurse

## 2021-09-03 ENCOUNTER — Encounter
Payer: No Typology Code available for payment source | Attending: Physical Medicine & Rehabilitation | Admitting: Registered Nurse

## 2021-09-03 ENCOUNTER — Encounter: Payer: Self-pay | Admitting: Registered Nurse

## 2021-09-03 ENCOUNTER — Other Ambulatory Visit: Payer: Self-pay

## 2021-09-03 VITALS — BP 127/78 | HR 93 | Ht 75.0 in | Wt 228.4 lb

## 2021-09-03 DIAGNOSIS — Z5181 Encounter for therapeutic drug level monitoring: Secondary | ICD-10-CM | POA: Diagnosis present

## 2021-09-03 DIAGNOSIS — Z79899 Other long term (current) drug therapy: Secondary | ICD-10-CM | POA: Insufficient documentation

## 2021-09-03 DIAGNOSIS — M47817 Spondylosis without myelopathy or radiculopathy, lumbosacral region: Secondary | ICD-10-CM | POA: Diagnosis present

## 2021-09-03 DIAGNOSIS — M546 Pain in thoracic spine: Secondary | ICD-10-CM | POA: Insufficient documentation

## 2021-09-03 DIAGNOSIS — M7061 Trochanteric bursitis, right hip: Secondary | ICD-10-CM | POA: Insufficient documentation

## 2021-09-03 DIAGNOSIS — M5416 Radiculopathy, lumbar region: Secondary | ICD-10-CM | POA: Insufficient documentation

## 2021-09-03 DIAGNOSIS — M961 Postlaminectomy syndrome, not elsewhere classified: Secondary | ICD-10-CM | POA: Insufficient documentation

## 2021-09-03 DIAGNOSIS — M25571 Pain in right ankle and joints of right foot: Secondary | ICD-10-CM | POA: Diagnosis present

## 2021-09-03 DIAGNOSIS — M5412 Radiculopathy, cervical region: Secondary | ICD-10-CM | POA: Insufficient documentation

## 2021-09-03 DIAGNOSIS — G894 Chronic pain syndrome: Secondary | ICD-10-CM | POA: Insufficient documentation

## 2021-09-03 DIAGNOSIS — G8929 Other chronic pain: Secondary | ICD-10-CM | POA: Insufficient documentation

## 2021-09-03 DIAGNOSIS — M542 Cervicalgia: Secondary | ICD-10-CM | POA: Insufficient documentation

## 2021-09-03 MED ORDER — OXYCODONE-ACETAMINOPHEN 5-325 MG PO TABS: 1.0000 | ORAL_TABLET | Freq: Two times a day (BID) | ORAL | 0 refills | Status: DC

## 2021-09-03 MED ORDER — XTAMPZA ER 18 MG PO C12A: 1.0000 | EXTENDED_RELEASE_CAPSULE | Freq: Two times a day (BID) | ORAL | 0 refills | Status: DC

## 2021-09-03 NOTE — Progress Notes (Signed)
Subjective:    Patient ID: David [name], male    DOB: 08/26/1976, 45 y.o.   MRN: [id number]  HPI: David [name] is a 45 y.o. male who returns for follow up appointment for chronic pain and medication refill. He states his  pain is located in his neck radiating into his left shoulder, right forearm muscle pain, he denies falling. Also reports mid- lower back pain radiating into his right hip, bilateral lower extremities and right ankle pain. He rates his pain 5. His current exercise regime is walking and performing stretching exercises.  David [name] Morphine equivalent is 78.15 MME. He is also prescribed Diazepam  by [facility] .We have discussed the black box warning of using opioids and benzodiazepines. I highlighted the dangers of using these drugs together and discussed the adverse events including respiratory suppression, overdose, cognitive impairment and importance of compliance with current regimen. We will continue to monitor and adjust as indicated.   Last UDS was Performed on 07/02/2021, it was consistent.       Pain Inventory Average Pain 5 Pain Right Now 5 My pain is intermittent, constant, sharp, burning, dull, stabbing, tingling, and aching  In the last 24 hours, has pain interfered with the following? General activity 5 Relation with others 5 Enjoyment of life 5 What TIME of day is your pain at its worst? morning , daytime, evening, and night Sleep (in general) Fair  Pain is worse with: walking, bending, sitting, inactivity, standing, and some activites Pain improves with: rest, heat/ice, therapy/exercise, pacing activities, medication, and TENS Relief from Meds: 5  Family History  Problem Relation Age of Onset   Arthritis Mother    Heart disease Mother    Diabetes Mother    Heart disease Father    Diabetes Father    Social History   Socioeconomic History   Marital status: Single    Spouse name: Not on file   Number of children: Not on file    Years of education: Not on file   Highest education level: Not on file  Occupational History   Not on file  Tobacco Use   Smoking status: Never   Smokeless tobacco: Never  Vaping Use   Vaping Use: Never used  Substance and Sexual Activity   Alcohol use: No   Drug use: Never   Sexual activity: Not on file  Other Topics Concern   Not on file  Social History Narrative   Not on file   Social Determinants of Health   Financial Resource Strain: Not on file  Food Insecurity: Not on file  Transportation Needs: Not on file  Physical Activity: Not on file  Stress: Not on file  Social Connections: Not on file   Past Surgical History:  Procedure Laterality Date   ANTERIOR CERVICAL DECOMP/DISCECTOMY FUSION  02-15-2008  @[facility]    C4--5,  C6--7, C7--T1   COLONOSCOPY     INGUINAL HERNIA REPAIR Bilateral 12/13/2020   Procedure: LAPAROSCOPIC BILATERAL INGUINAL HERNIA REPAIR AND POSSIBLE APPENDECTOMY;  Surgeon: 02/10/2021, MD;  Location: [facility];  Service: General;  Laterality: Bilateral;  ERAS   Past Surgical History:  Procedure Laterality Date   ANTERIOR CERVICAL DECOMP/DISCECTOMY FUSION  02-15-2008  @[facility]    C4--5,  C6--7, C7--T1   COLONOSCOPY     INGUINAL HERNIA REPAIR Bilateral 12/13/2020   Procedure: LAPAROSCOPIC BILATERAL INGUINAL HERNIA REPAIR AND POSSIBLE APPENDECTOMY;  Surgeon: , MD;  Location: [facility];  Service: General;  Laterality:  Bilateral;  ERAS   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    Family history of adverse reaction to anesthesia    father--- ponv   GERD (gastroesophageal reflux disease)    occasional, watches diet   Greater trochanteric bursitis of right hip    History of kidney stones    Hypertension     Inguinal hernia, bilateral    Lumbar spondylosis    Nocturia    Peripheral neuropathy    bilateral lower legs and a little of left arm   Postlaminectomy syndrome, cervical region    Primary localized osteoarthrosis, lower leg    Radiculopathy, multiple sites in spine    cervical , thoracic, lumbar   Type 2 diabetes mellitus (HCC)    followed by pcp---  (12-11-2020 checks blood sugar 3 times wkly,  fasting sugar--- 125 )   Wears glasses    BP 127/78   Pulse 93   Ht 6\' 3"  (1.905 m)   Wt 228 lb 6.4 oz (103.6 kg)   SpO2 97%   BMI 28.55 kg/m   Opioid Risk Score:   Fall Risk Score:  `1  Depression screen PHQ 2/9  Depression screen [facility] 2/9 09/03/2021 07/30/2021 06/04/2021 05/07/2021 03/05/2021 02/05/2021 12/04/2020  Decreased Interest 1 1 1 1 1 1 1   Down, Depressed, Hopeless 1 1 1 1 1 1 1   PHQ - 2 Score 2 2 2 2 2 2 2   Altered sleeping - - - - - - -  Tired, decreased energy - - - - - - -  Change in appetite - - - - - - -  Feeling bad or failure about yourself  - - - - - - -  Trouble concentrating - - - - - - -  Moving slowly or fidgety/restless - - - - - - -  Suicidal thoughts - - - - - - -  PHQ-9 Score - - - - - - -  Difficult doing work/chores - - - - - - -  Some recent data might be hidden     Review of Systems  Constitutional: Negative.   HENT: Negative.    Eyes: Negative.   Respiratory: Negative.    Cardiovascular: Negative.   Gastrointestinal: Negative.   Endocrine: Negative.   Genitourinary: Negative.   Musculoskeletal:  Positive for back pain.  Skin: Negative.   Allergic/Immunologic: Negative.   Neurological: Negative.   Hematological: Negative.   Psychiatric/Behavioral:  Positive for dysphoric mood.   All other systems reviewed and are negative.     Objective:   Physical Exam Vitals and nursing note reviewed.  Constitutional:      Appearance: Normal appearance.  Neck:     Comments: Cervical Paraspinal Tenderness: C-5-C-6 Cardiovascular:     Rate and Rhythm:  Normal rate and regular rhythm.     Pulses: Normal pulses.     Heart sounds: Normal heart sounds.  Musculoskeletal:     Cervical back: Normal range of motion and neck supple.     Comments: Normal Muscle Bulk and Muscle Testing Reveals:  Upper Extremities: Full ROM and Muscle Strength 5/5 Left AC Joint Tenderness Thoracic Paraspinal Tenderness: T-7-T-9 Lumbar Paraspinal Tenderness: L-3-L-5 Right Greater Trochanter Tenderness Lower Extremities: Full ROM and Muscle Strength 5/5 Arises from chair  with ease Narrow Based  Gait     Skin:    General: Skin is warm and dry.  Neurological:     Mental Status: He is alert and oriented to person, place, and time.  Psychiatric:        Mood and Affect: Mood normal.        Behavior: Behavior normal.         Assessment & Plan:  1. Cervical postlaminectomy syndrome, Hx of ACDF C4-5, C6-7, C7-T1, 02/2008. Continue current medication and alternate using heat and ice therapy. 09/03/2021 2. Spondylosis of L-spine . Continue current treatment regime. 09/03/2021 3. Chronic Pain Syndrome Refilled: Xtampza 18 mg one capsule every 12 hours for pain #60 and Oxycodone 5/325mg  one tablet twice a day, may take a extra tablet when pain is sever # 75. Second script e-scribed to accommodate scheduled appointment.  Continue Voltaren Gel/ Flector Patch. 09/03/2021 We will continue the opioid monitoring program, this consists of regular clinic visits, examinations, urine drug screen, pill counts as well as use of [place] Controlled Substance Reporting system. A 12 month History has been reviewed on the [place] Controlled Substance Reporting System on 09/03/2021. 4. Diabetes with neuropathy. Continue current medication regimen with  Gabapentin. Keep blood sugars under tight control. Continue to monitor. 09/03/2021 5. Cervicalgia/Cervical Radiculopathy/ Thoracic Radiculopathy/ Lumbar Radiculopathy:Continue Gabapentin.09/03/2021 6. Right Greater Trochanter  Bursitis: Continue Ice/Heat and Current medication regimen. 09/03/2021 7. Chronic Pain Syndrome/ Myofascial Pain: Continue Flector Patch/ Alternates with Voltaren Gel. 09/03/2021 8. Sacroiliac Pain: Continue HEP as Tolerated. Continue to Monitor.09/03/2021 9. Chronic Right Ankle Pain: .Continue HEP as tolerated. Continue to Monitor. 09/03/2021. 10. Post-Op Pain:  No complaints Today.  S/P LAPAROSCOPIC BILATERAL INGUINAL HERNIA REPAIR AND POSSIBLE APPENDECTOMY Dr Michaell Cowing Following.   11. Polyarthralgia: Continue HEP as Tolerated. Continue to monitor. 09/03/2021   F/U in 1 month

## 2021-10-22 ENCOUNTER — Encounter
Payer: No Typology Code available for payment source | Attending: Physical Medicine & Rehabilitation | Admitting: Registered Nurse

## 2021-10-22 ENCOUNTER — Other Ambulatory Visit: Payer: Self-pay

## 2021-10-22 ENCOUNTER — Encounter: Payer: Self-pay | Admitting: Registered Nurse

## 2021-10-22 VITALS — BP 125/74 | HR 70 | Temp 98.9°F | Ht 75.0 in | Wt 239.2 lb

## 2021-10-22 DIAGNOSIS — M2550 Pain in unspecified joint: Secondary | ICD-10-CM | POA: Insufficient documentation

## 2021-10-22 DIAGNOSIS — G5692 Unspecified mononeuropathy of left upper limb: Secondary | ICD-10-CM | POA: Diagnosis present

## 2021-10-22 DIAGNOSIS — M5412 Radiculopathy, cervical region: Secondary | ICD-10-CM | POA: Diagnosis present

## 2021-10-22 DIAGNOSIS — M5416 Radiculopathy, lumbar region: Secondary | ICD-10-CM | POA: Diagnosis present

## 2021-10-22 DIAGNOSIS — M47817 Spondylosis without myelopathy or radiculopathy, lumbosacral region: Secondary | ICD-10-CM | POA: Insufficient documentation

## 2021-10-22 DIAGNOSIS — M25571 Pain in right ankle and joints of right foot: Secondary | ICD-10-CM | POA: Diagnosis present

## 2021-10-22 DIAGNOSIS — G8929 Other chronic pain: Secondary | ICD-10-CM

## 2021-10-22 DIAGNOSIS — M546 Pain in thoracic spine: Secondary | ICD-10-CM | POA: Insufficient documentation

## 2021-10-22 DIAGNOSIS — M961 Postlaminectomy syndrome, not elsewhere classified: Secondary | ICD-10-CM | POA: Insufficient documentation

## 2021-10-22 DIAGNOSIS — M7061 Trochanteric bursitis, right hip: Secondary | ICD-10-CM | POA: Insufficient documentation

## 2021-10-22 DIAGNOSIS — M542 Cervicalgia: Secondary | ICD-10-CM

## 2021-10-22 DIAGNOSIS — G5793 Unspecified mononeuropathy of bilateral lower limbs: Secondary | ICD-10-CM | POA: Diagnosis present

## 2021-10-22 MED ORDER — OXYCODONE-ACETAMINOPHEN 5-325 MG PO TABS: 1.0000 | ORAL_TABLET | Freq: Two times a day (BID) | ORAL | 0 refills | Status: DC

## 2021-10-22 MED ORDER — XTAMPZA ER 18 MG PO C12A: 1.0000 | EXTENDED_RELEASE_CAPSULE | Freq: Two times a day (BID) | ORAL | 0 refills | Status: DC

## 2021-10-22 NOTE — Progress Notes (Signed)
Subjective:    Patient ID: David [name], male    DOB: [birth year], 45 y.o.   MRN: [id number]  HPI: David [name] is a 45 y.o. male who returns for follow up appointment for chronic pain and medication refill. He states his  pain is located in his neck radiating into his bilateral shoulders, upper back, mid- lower back radiating into his bilateral lower extremities L>R  .Also reports right hip pain  and bilateral feet pain with tingling  and right ankle pain. He rates his pain 5. His current exercise regime is walking and performing stretching exercises.  David [name] Morphine equivalent is 78.75 MME. He is also prescribed Diazepam  by Mauricio Po Np .We have discussed the black box warning of using opioids and benzodiazepines. I highlighted the dangers of using these drugs together and discussed the adverse events including respiratory suppression, overdose, cognitive impairment and importance of compliance with current regimen. We will continue to monitor and adjust as indicated.     Last UDS was Performed on 07/02/2021, it was consistent.    Pain Inventory Average Pain 5 Pain Right Now 5 My pain is intermittent, constant, sharp, burning, dull, stabbing, tingling, and aching  In the last 24 hours, has pain interfered with the following? General activity 5 Relation with others 4 Enjoyment of life 5 What TIME of day is your pain at its worst? morning , daytime, evening, and night Sleep (in general) Fair  Pain is worse with: walking, bending, sitting, inactivity, standing, and some activites Pain improves with: rest, heat/ice, therapy/exercise, pacing activities, medication, and TENS Relief from Meds: 5  Family History  Problem Relation Age of Onset   Arthritis Mother    Heart disease Mother    Diabetes Mother    Heart disease Father    Diabetes Father    Social History   Socioeconomic History   Marital status: Single    Spouse name: Not on file   Number of children: Not  on file   Years of education: Not on file   Highest education level: Not on file  Occupational History   Not on file  Tobacco Use   Smoking status: Never   Smokeless tobacco: Never  Vaping Use   Vaping Use: Never used  Substance and Sexual Activity   Alcohol use: No   Drug use: Never   Sexual activity: Not on file  Other Topics Concern   Not on file  Social History Narrative   Not on file   Social Determinants of Health   Financial Resource Strain: Not on file  Food Insecurity: Not on file  Transportation Needs: Not on file  Physical Activity: Not on file  Stress: Not on file  Social Connections: Not on file   Past Surgical History:  Procedure Laterality Date   ANTERIOR CERVICAL DECOMP/DISCECTOMY FUSION  02-15-2008  @[facility]    C4--5,  C6--7, C7--T1   COLONOSCOPY     INGUINAL HERNIA REPAIR Bilateral 12/13/2020   Procedure: LAPAROSCOPIC BILATERAL INGUINAL HERNIA REPAIR AND POSSIBLE APPENDECTOMY;  Surgeon: 02/10/2021, MD;  Location: [facility];  Service: General;  Laterality: Bilateral;  ERAS   Past Surgical History:  Procedure Laterality Date   ANTERIOR CERVICAL DECOMP/DISCECTOMY FUSION  02-15-2008  @[facility]    C4--5,  C6--7, C7--T1   COLONOSCOPY     INGUINAL HERNIA REPAIR Bilateral 12/13/2020   Procedure: LAPAROSCOPIC BILATERAL INGUINAL HERNIA REPAIR AND POSSIBLE APPENDECTOMY;  Surgeon: , MD;  Location: [facility];  Service: General;  Laterality: Bilateral;  ERAS   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    Family history of adverse reaction to anesthesia    father--- ponv   GERD (gastroesophageal reflux disease)    occasional, watches diet   Greater trochanteric bursitis of right hip    History of kidney stones    Hypertension     Inguinal hernia, bilateral    Lumbar spondylosis    Nocturia    Peripheral neuropathy    bilateral lower legs and a little of left arm   Postlaminectomy syndrome, cervical region    Primary localized osteoarthrosis, lower leg    Radiculopathy, multiple sites in spine    cervical , thoracic, lumbar   Type 2 diabetes mellitus (HCC)    followed by pcp---  (12-11-2020 checks blood sugar 3 times wkly,  fasting sugar--- 125 )   Wears glasses    BP 125/74    Pulse 70    Temp 98.9 F (37.2 C)    Ht 6\' 3"  (1.905 m)    Wt 239 lb 3.2 oz (108.5 kg)    SpO2 98%    BMI 29.90 kg/m   Opioid Risk Score:   Fall Risk Score:  `1  Depression screen PHQ 2/9  Depression screen [facility] 2/9 10/22/2021 09/03/2021 07/30/2021 06/04/2021 05/07/2021 03/05/2021 02/05/2021  Decreased Interest 0 1 1 1 1 1 1   Down, Depressed, Hopeless 0 1 1 1 1 1 1   PHQ - 2 Score 0 2 2 2 2 2 2   Altered sleeping - - - - - - -  Tired, decreased energy - - - - - - -  Change in appetite - - - - - - -  Feeling bad or failure about yourself  - - - - - - -  Trouble concentrating - - - - - - -  Moving slowly or fidgety/restless - - - - - - -  Suicidal thoughts - - - - - - -  PHQ-9 Score - - - - - - -  Difficult doing work/chores - - - - - - -  Some recent data might be hidden     Review of Systems  Constitutional: Negative.   HENT: Negative.    Eyes: Negative.   Respiratory: Negative.    Cardiovascular: Negative.   Endocrine: Negative.   Genitourinary: Negative.   Musculoskeletal:  Positive for back pain and neck pain.  Skin: Negative.   Allergic/Immunologic: Negative.   Neurological: Negative.   Hematological: Negative.   Psychiatric/Behavioral: Negative.        Objective:   Physical Exam Vitals and nursing note reviewed.  Constitutional:      Appearance: Normal appearance.  Neck:     Comments: Cervical Paraspinal Tenderness: C-5-C-6 Cardiovascular:     Rate and Rhythm: Normal rate and regular rhythm.     Pulses: Normal  pulses.     Heart sounds: Normal heart sounds.  Pulmonary:     Effort: Pulmonary effort is normal.     Breath sounds: Normal breath sounds.  Musculoskeletal:     Cervical back: Normal range of motion and neck supple.     Comments: Normal Muscle Bulk and Muscle Testing Reveals:  Upper Extremities: Full ROM and Muscle Strength 5/5 Thoracic  and Lumbar Hypersensitivity Right Greater Trochanter Tenderness Lower Extremities: Full ROM and Muscle Strength 5/5 Arises from Table with ease Narrow Based  Gait     Skin:    General: Skin is warm and dry.  Neurological:     Mental Status: He is alert and oriented to person, place, and time.  Psychiatric:        Mood and Affect: Mood normal.        Behavior: Behavior normal.         Assessment & Plan:  1. Cervical postlaminectomy syndrome, Hx of ACDF C4-5, C6-7, C7-T1, 02/2008. Continue current medication and alternate using heat and ice therapy. 10/22/2021 2. Spondylosis of L-spine . Continue current treatment regime. 10/22/2021 3. Chronic Pain Syndrome Refilled: Xtampza 18 mg one capsule every 12 hours for pain #60 and Oxycodone 5/325mg  one tablet twice a day, may take a extra tablet when pain is sever # 75. Second script e-scribed to accommodate scheduled appointment.  Continue Voltaren Gel/ Flector Patch. 12//21/22 We will continue the opioid monitoring program, this consists of regular clinic visits, examinations, urine drug screen, pill counts as well as use of [place] Controlled Substance Reporting system. A 12 month History has been reviewed on the [place] Controlled Substance Reporting System on 10/22/2021. 4. Diabetes with neuropathy. Continue current medication regimen with  Gabapentin. Keep blood sugars under tight control. Continue to monitor. 10/22/2021 5. Cervicalgia/Cervical Radiculopathy/ Thoracic Radiculopathy/ Lumbar Radiculopathy:Continue Gabapentin.10/22/2021 6. Right Greater Trochanter Bursitis: Continue Ice/Heat  and Current medication regimen. 10/22/2021 7. Chronic Pain Syndrome/ Myofascial Pain: Continue Flector Patch/ Alternates with Voltaren Gel. 10/22/2021 8. Sacroiliac Pain: Continue HEP as Tolerated. Continue to Monitor.10/22/2021 9. Chronic Right Ankle Pain: .Continue HEP as tolerated. Continue to Monitor. 10/22/2021. 10. Post-Op Pain:  No complaints Today.  S/P LAPAROSCOPIC BILATERAL INGUINAL HERNIA REPAIR AND POSSIBLE APPENDECTOMY Dr Michaell Cowing Following.   11. Polyarthralgia: Continue HEP as Tolerated. Continue to monitor. 10/22/2021   F/U in 1 month

## 2021-11-19 ENCOUNTER — Encounter: Payer: Self-pay | Admitting: Registered Nurse

## 2021-11-19 ENCOUNTER — Other Ambulatory Visit: Payer: Self-pay

## 2021-11-19 ENCOUNTER — Encounter
Payer: No Typology Code available for payment source | Attending: Physical Medicine & Rehabilitation | Admitting: Registered Nurse

## 2021-11-19 VITALS — BP 121/77 | HR 84 | Ht 75.0 in | Wt 230.4 lb

## 2021-11-19 DIAGNOSIS — M25571 Pain in right ankle and joints of right foot: Secondary | ICD-10-CM | POA: Diagnosis present

## 2021-11-19 DIAGNOSIS — M5416 Radiculopathy, lumbar region: Secondary | ICD-10-CM | POA: Insufficient documentation

## 2021-11-19 DIAGNOSIS — M7061 Trochanteric bursitis, right hip: Secondary | ICD-10-CM | POA: Diagnosis present

## 2021-11-19 DIAGNOSIS — M546 Pain in thoracic spine: Secondary | ICD-10-CM | POA: Insufficient documentation

## 2021-11-19 DIAGNOSIS — G894 Chronic pain syndrome: Secondary | ICD-10-CM | POA: Diagnosis present

## 2021-11-19 DIAGNOSIS — G8929 Other chronic pain: Secondary | ICD-10-CM | POA: Insufficient documentation

## 2021-11-19 DIAGNOSIS — M2550 Pain in unspecified joint: Secondary | ICD-10-CM | POA: Diagnosis present

## 2021-11-19 DIAGNOSIS — Z79891 Long term (current) use of opiate analgesic: Secondary | ICD-10-CM | POA: Diagnosis present

## 2021-11-19 DIAGNOSIS — M961 Postlaminectomy syndrome, not elsewhere classified: Secondary | ICD-10-CM | POA: Diagnosis present

## 2021-11-19 DIAGNOSIS — M5412 Radiculopathy, cervical region: Secondary | ICD-10-CM | POA: Diagnosis present

## 2021-11-19 DIAGNOSIS — M542 Cervicalgia: Secondary | ICD-10-CM | POA: Insufficient documentation

## 2021-11-19 DIAGNOSIS — M47817 Spondylosis without myelopathy or radiculopathy, lumbosacral region: Secondary | ICD-10-CM | POA: Diagnosis present

## 2021-11-19 DIAGNOSIS — Z5181 Encounter for therapeutic drug level monitoring: Secondary | ICD-10-CM | POA: Diagnosis present

## 2021-11-19 MED ORDER — XTAMPZA ER 18 MG PO C12A: 1.0000 | EXTENDED_RELEASE_CAPSULE | Freq: Two times a day (BID) | ORAL | 0 refills | Status: DC

## 2021-11-19 MED ORDER — OXYCODONE-ACETAMINOPHEN 5-325 MG PO TABS: 1.0000 | ORAL_TABLET | Freq: Two times a day (BID) | ORAL | 0 refills | Status: DC

## 2021-11-19 NOTE — Progress Notes (Signed)
Subjective:    Patient ID: David [name], male    DOB: May 04, 1976, 46 y.o.   MRN: [id number]  HPI: David [name] is a 46 y.o. male who returns for follow up appointment for chronic pain and medication refill. He states his pain is located in his neck radiating into his left arm with tingling, upper back -mid back pain and lower back pain radiating into his bilateral lower extremities with tingling and burning L>R . [name] states he also has a headache he awaken with headache this morning, he states he took Tylenol.   He rates his pain 6. His current exercise regime is walking and performing stretching exercises.  [name] is 78.75 MME.   UDS ordered today.     Pain Inventory Average Pain 5 Pain Right Now 6 My pain is constant, sharp, burning, dull, stabbing, tingling, and aching  In the last 24 hours, has pain interfered with the following? General activity 5 Relation with others 4 Enjoyment of life 5 What TIME of day is your pain at its worst? morning , daytime, evening, and night Sleep (in general) Fair  Pain is worse with: walking, bending, sitting, inactivity, standing, and some activites Pain improves with: rest, heat/ice, therapy/exercise, pacing activities, medication, TENS, and injections Relief from Meds: 5  Family History  Problem Relation Age of Onset   Arthritis Mother    Heart disease Mother    Diabetes Mother    Heart disease Father    Diabetes Father    Social History   Socioeconomic History   Marital status: Single    Spouse name: Not on file   Number of children: Not on file   Years of education: Not on file   Highest education level: Not on file  Occupational History   Not on file  Tobacco Use   Smoking status: Never   Smokeless tobacco: Never  Vaping Use   Vaping Use: Never used  Substance and Sexual Activity   Alcohol use: No   Drug use: Never   Sexual activity: Not on file  Other Topics Concern   Not on  file  Social History Narrative   Not on file   Social Determinants of Health   Financial Resource Strain: Not on file  Food Insecurity: Not on file  Transportation Needs: Not on file  Physical Activity: Not on file  Stress: Not on file  Social Connections: Not on file   Past Surgical History:  Procedure Laterality Date   ANTERIOR CERVICAL DECOMP/DISCECTOMY FUSION  02-15-2008  @[facility]    C4--5,  C6--7, C7--T1   COLONOSCOPY     INGUINAL HERNIA REPAIR Bilateral 12/13/2020   Procedure: LAPAROSCOPIC BILATERAL INGUINAL HERNIA REPAIR AND POSSIBLE APPENDECTOMY;  Surgeon: Karie SodaGross, Steven, MD;  [facility] [place];  Service: General;  Laterality: Bilateral;  ERAS   Past Surgical History:  Procedure Laterality Date   ANTERIOR CERVICAL DECOMP/DISCECTOMY FUSION  02-15-2008  @[facility]    C4--5,  C6--7, C7--T1   COLONOSCOPY     INGUINAL HERNIA REPAIR Bilateral 12/13/2020   Procedure: LAPAROSCOPIC BILATERAL INGUINAL HERNIA REPAIR AND POSSIBLE APPENDECTOMY;  Surgeon: Karie SodaGross, Steven, MD;  [facility];  Service: General;  Laterality: Bilateral;  ERAS   Past Medical History:  Diagnosis Date   Cervicalgia    Chronic pain syndrome    pain clinic   Complication of anesthesia    post op urinary retention after ACDF surgery 04/ 2009   Degeneration of thoracic or  thoracolumbar intervertebral disc    Family history of adverse reaction to anesthesia    father--- ponv   GERD (gastroesophageal reflux disease)    occasional, watches diet   Greater trochanteric bursitis of right hip    History of kidney stones    Hypertension    Inguinal hernia, bilateral    Lumbar spondylosis    Nocturia    Peripheral neuropathy    bilateral lower legs and a little of left arm   Postlaminectomy syndrome, cervical region    Primary localized osteoarthrosis, lower leg    Radiculopathy, multiple sites in spine    cervical , thoracic, lumbar   Type 2 diabetes mellitus (HCC)    followed by  pcp---  (12-11-2020 checks blood sugar 3 times wkly,  fasting sugar--- 125 )   Wears glasses    BP 121/77    Pulse 84    Ht 6\' 3"  (1.905 m)    Wt 230 lb 6.4 oz (104.5 kg)    SpO2 98%    BMI 28.80 kg/m   Opioid Risk Score:   Fall Risk Score:  `1  Depression screen PHQ 2/9  Depression screen [facility] 2/9 10/22/2021 09/03/2021 07/30/2021 06/04/2021 05/07/2021 03/05/2021 02/05/2021  Decreased Interest 0 1 1 1 1 1 1   Down, Depressed, Hopeless 0 1 1 1 1 1 1   PHQ - 2 Score 0 2 2 2 2 2 2   Altered sleeping - - - - - - -  Tired, decreased energy - - - - - - -  Change in appetite - - - - - - -  Feeling bad or failure about yourself  - - - - - - -  Trouble concentrating - - - - - - -  Moving slowly or fidgety/restless - - - - - - -  Suicidal thoughts - - - - - - -  PHQ-9 Score - - - - - - -  Difficult doing work/chores - - - - - - -  Some recent data might be hidden     Review of Systems  Musculoskeletal:  Positive for back pain.  All other systems reviewed and are negative.     Objective:   Physical Exam Vitals and nursing note reviewed.  Constitutional:      Appearance: Normal appearance.  Neck:     Comments: Cervical Paraspinal Tenderness: C-5-C-6 Cardiovascular:     Rate and Rhythm: Normal rate and regular rhythm.     Pulses: Normal pulses.     Heart sounds: Normal heart sounds.  Pulmonary:     Effort: Pulmonary effort is normal.     Breath sounds: Normal breath sounds.  Musculoskeletal:     Cervical back: Normal range of motion and neck supple.     Comments: Normal Muscle Bulk and Muscle Testing Reveals:  Upper Extremities: Full ROM and Muscle Strength 5/5 Bilateral AC Joint Tenderness Thoracic and Lumbar Hypersensitivity Right Greater Trochanter Tenderness Lower Extremities: Full ROM and Muscle Strength 5/5 Arises from Table with Ease Narrow Based  Gait     Skin:    General: Skin is warm and dry.  Neurological:     Mental Status: He is alert and oriented to person, place, and  time.  Psychiatric:        Mood and Affect: Mood normal.        Behavior: Behavior normal.         Assessment & Plan:  1. Cervical postlaminectomy syndrome, Hx of ACDF C4-5, C6-7, C7-T1, 02/2008.  Continue current medication and alternate using heat and ice therapy. 11/19/2021 2. Spondylosis of L-spine . Continue current treatment regime. 11/19/2021 3. Chronic Pain Syndrome Refilled: Xtampza 18 mg one capsule every 12 hours for pain #60 and Oxycodone 5/325mg  one tablet twice a day, may take a extra tablet when pain is sever # 75. Second script e-scribed to accommodate scheduled appointment.  Continue Voltaren Gel/ Flector Patch. 01//18/23. We will continue the opioid monitoring program, this consists of regular clinic visits, examinations, urine drug screen, pill counts as well as use of [place] Controlled Substance Reporting system. A 12 month History has been reviewed on the [place] Controlled Substance Reporting System on 11/19/2021. 4. Diabetes with neuropathy. Continue current medication regimen with  Gabapentin. Keep blood sugars under tight control. Continue to monitor. 11/19/2021 5. Cervicalgia/Cervical Radiculopathy/ Thoracic Radiculopathy/ Lumbar Radiculopathy:Continue Gabapentin.11/19/2021 6. Right Greater Trochanter Bursitis: Continue Ice/Heat and Current medication regimen. 11/19/2021 7. Chronic Pain Syndrome/ Myofascial Pain: Continue Flector Patch/ Alternates with Voltaren Gel. 11/19/2021 8. Sacroiliac Pain: Continue HEP as Tolerated. Continue to Monitor.11/19/2021 9. Chronic Right Ankle Pain: .Continue HEP as tolerated. Continue to Monitor. 11/19/2021. 10. Post-Op Pain:  No complaints Today.  S/P LAPAROSCOPIC BILATERAL INGUINAL HERNIA REPAIR AND POSSIBLE APPENDECTOMY Dr Michaell Cowing Following.   11. Polyarthralgia: Continue HEP as Tolerated. Continue to monitor. 11/19/2021   F/U in 1 month

## 2021-11-24 LAB — TOXASSURE SELECT,+ANTIDEPR,UR

## 2021-11-26 ENCOUNTER — Ambulatory Visit: Payer: No Typology Code available for payment source | Admitting: Registered Nurse

## 2021-12-02 ENCOUNTER — Telehealth: Payer: Self-pay | Admitting: *Deleted

## 2021-12-02 NOTE — Telephone Encounter (Signed)
Urine drug screen for this encounter is consistent for prescribed medication 

## 2021-12-03 ENCOUNTER — Telehealth: Payer: Self-pay | Admitting: Registered Nurse

## 2021-12-03 MED ORDER — OXYCODONE-ACETAMINOPHEN 5-325 MG PO TABS: 1.0000 | ORAL_TABLET | Freq: Two times a day (BID) | ORAL | 0 refills | Status: DC

## 2021-12-03 NOTE — Telephone Encounter (Signed)
PMP was Reviewed.  Oxycodone e-scribed today. [name] is aware via My-Chart message.

## 2021-12-24 ENCOUNTER — Other Ambulatory Visit: Payer: Self-pay

## 2021-12-24 ENCOUNTER — Ambulatory Visit: Payer: No Typology Code available for payment source | Admitting: Registered Nurse

## 2021-12-24 ENCOUNTER — Encounter
Payer: No Typology Code available for payment source | Attending: Physical Medicine & Rehabilitation | Admitting: Registered Nurse

## 2021-12-24 VITALS — BP 129/80 | HR 84 | Ht 75.0 in | Wt 229.0 lb

## 2021-12-24 DIAGNOSIS — M961 Postlaminectomy syndrome, not elsewhere classified: Secondary | ICD-10-CM | POA: Insufficient documentation

## 2021-12-24 DIAGNOSIS — G894 Chronic pain syndrome: Secondary | ICD-10-CM | POA: Insufficient documentation

## 2021-12-24 DIAGNOSIS — M2550 Pain in unspecified joint: Secondary | ICD-10-CM | POA: Diagnosis present

## 2021-12-24 DIAGNOSIS — Z79891 Long term (current) use of opiate analgesic: Secondary | ICD-10-CM | POA: Insufficient documentation

## 2021-12-24 DIAGNOSIS — G8929 Other chronic pain: Secondary | ICD-10-CM | POA: Diagnosis present

## 2021-12-24 DIAGNOSIS — M5412 Radiculopathy, cervical region: Secondary | ICD-10-CM | POA: Diagnosis present

## 2021-12-24 DIAGNOSIS — G5793 Unspecified mononeuropathy of bilateral lower limbs: Secondary | ICD-10-CM | POA: Diagnosis present

## 2021-12-24 DIAGNOSIS — M25571 Pain in right ankle and joints of right foot: Secondary | ICD-10-CM | POA: Insufficient documentation

## 2021-12-24 DIAGNOSIS — Z5181 Encounter for therapeutic drug level monitoring: Secondary | ICD-10-CM | POA: Diagnosis present

## 2021-12-24 DIAGNOSIS — M542 Cervicalgia: Secondary | ICD-10-CM | POA: Diagnosis present

## 2021-12-24 DIAGNOSIS — M7061 Trochanteric bursitis, right hip: Secondary | ICD-10-CM | POA: Insufficient documentation

## 2021-12-24 DIAGNOSIS — M47817 Spondylosis without myelopathy or radiculopathy, lumbosacral region: Secondary | ICD-10-CM | POA: Diagnosis present

## 2021-12-24 DIAGNOSIS — G5692 Unspecified mononeuropathy of left upper limb: Secondary | ICD-10-CM | POA: Insufficient documentation

## 2021-12-24 DIAGNOSIS — M5416 Radiculopathy, lumbar region: Secondary | ICD-10-CM | POA: Insufficient documentation

## 2021-12-24 DIAGNOSIS — M546 Pain in thoracic spine: Secondary | ICD-10-CM | POA: Insufficient documentation

## 2021-12-24 MED ORDER — XTAMPZA ER 18 MG PO C12A: 1.0000 | EXTENDED_RELEASE_CAPSULE | Freq: Two times a day (BID) | ORAL | 0 refills | Status: DC

## 2021-12-24 MED ORDER — OXYCODONE-ACETAMINOPHEN 5-325 MG PO TABS: 1.0000 | ORAL_TABLET | Freq: Two times a day (BID) | ORAL | 0 refills | Status: DC

## 2021-12-24 NOTE — Progress Notes (Signed)
Subjective:    Patient ID: David [name], male    DOB: December 23, 1975, 46 y.o.   MRN: [id number]  HPI: David [name] is a 46 y.o. male who returns for follow up appointment for chronic pain and medication refill. He states his pain is located in his neck radiating into his bilateral shoulders L>R, mid- lower back pain radiating into bilateral lower extremities L>R, right hip and right ankle pain. Also reports tingling and burning in left arm and bilateral feet. He rates his pain 5. His current exercise regime is walking and performing stretching exercises.  Mr. Trimper Morphine equivalent is 78.75 MME.He  is also prescribed Diazepam  by Mauricio Po  NP .We have discussed the black box warning of using opioids and benzodiazepines. I highlighted the dangers of using these drugs together and discussed the adverse events including respiratory suppression, overdose, cognitive impairment and importance of compliance with current regimen. We will continue to monitor and adjust as indicated.    Last UDS was Performed on 11/19/2022, it was consistent.      Pain Inventory Average Pain 5 Pain Right Now 5 My pain is intermittent, constant, sharp, burning, dull, stabbing, tingling, and aching  In the last 24 hours, has pain interfered with the following? General activity 5 Relation with others 4 Enjoyment of life 5 What TIME of day is your pain at its worst? morning , daytime, evening, and night Sleep (in general) Fair  Pain is worse with: walking, bending, sitting, inactivity, standing, and some activites Pain improves with: rest, heat/ice, pacing activities, and medication Relief from Meds: 5  Family History  Problem Relation Age of Onset   Arthritis Mother    Heart disease Mother    Diabetes Mother    Heart disease Father    Diabetes Father    Social History   Socioeconomic History   Marital status: Single    Spouse name: Not on file   Number of children: Not on file   Years of  education: Not on file   Highest education level: Not on file  Occupational History   Not on file  Tobacco Use   Smoking status: Never   Smokeless tobacco: Never  Vaping Use   Vaping Use: Never used  Substance and Sexual Activity   Alcohol use: No   Drug use: Never   Sexual activity: Not on file  Other Topics Concern   Not on file  Social History Narrative   Not on file   Social Determinants of Health   Financial Resource Strain: Not on file  Food Insecurity: Not on file  Transportation Needs: Not on file  Physical Activity: Not on file  Stress: Not on file  Social Connections: Not on file   Past Surgical History:  Procedure Laterality Date   ANTERIOR CERVICAL DECOMP/DISCECTOMY FUSION  02-15-2008  @[facility]    C4--5,  C6--7, C7--T1   COLONOSCOPY     INGUINAL HERNIA REPAIR Bilateral 12/13/2020   Procedure: LAPAROSCOPIC BILATERAL INGUINAL HERNIA REPAIR AND POSSIBLE APPENDECTOMY;  Surgeon: [name], MD;  Location: [facility];  Service: General;  Laterality: Bilateral;  ERAS   Past Surgical History:  Procedure Laterality Date   ANTERIOR CERVICAL DECOMP/DISCECTOMY FUSION  02-15-2008  @[facility]    C4--5,  C6--7, C7--T1   COLONOSCOPY     INGUINAL HERNIA REPAIR Bilateral 12/13/2020   Procedure: LAPAROSCOPIC BILATERAL INGUINAL HERNIA REPAIR AND POSSIBLE APPENDECTOMY;  Surgeon: [name], MD;  Location: [facility];  Service: General;  Laterality: Bilateral;  ERAS   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    Family history of adverse reaction to anesthesia    father--- ponv   GERD (gastroesophageal reflux disease)    occasional, watches diet   Greater trochanteric bursitis of right hip    History of kidney stones    Hypertension    Inguinal  hernia, bilateral    Lumbar spondylosis    Nocturia    Peripheral neuropathy    bilateral lower legs and a little of left arm   Postlaminectomy syndrome, cervical region    Primary localized osteoarthrosis, lower leg    Radiculopathy, multiple sites in spine    cervical , thoracic, lumbar   Type 2 diabetes mellitus (HCC)    followed by pcp---  (12-11-2020 checks blood sugar 3 times wkly,  fasting sugar--- 125 )   Wears glasses    BP 129/80    Pulse 84    Ht 6\' 3"  (1.905 m)    Wt 229 lb (103.9 kg)    SpO2 97%    BMI 28.62 kg/m   Opioid Risk Score:   Fall Risk Score:  `1  Depression screen PHQ 2/9  Depression screen [facility] 2/9 12/24/2021 11/19/2021 10/22/2021 09/03/2021 07/30/2021 06/04/2021 05/07/2021  Decreased Interest 0 0 0 1 1 1 1   Down, Depressed, Hopeless 0 0 0 1 1 1 1   PHQ - 2 Score 0 0 0 2 2 2 2   Altered sleeping - - - - - - -  Tired, decreased energy - - - - - - -  Change in appetite - - - - - - -  Feeling bad or failure about yourself  - - - - - - -  Trouble concentrating - - - - - - -  Moving slowly or fidgety/restless - - - - - - -  Suicidal thoughts - - - - - - -  PHQ-9 Score - - - - - - -  Difficult doing work/chores - - - - - - -  Some recent data might be hidden    Review of Systems  Musculoskeletal:  Positive for back pain and myalgias.  All other systems reviewed and are negative.     Objective:   Physical Exam Vitals and nursing note reviewed.  Constitutional:      Appearance: Normal appearance.  Neck:     Comments: Cervical Paraspinal Tenderness: C-5-C-6 Cardiovascular:     Rate and Rhythm: Normal rate and regular rhythm.     Pulses: Normal pulses.     Heart sounds: Normal heart sounds.  Pulmonary:     Effort: Pulmonary effort is normal.     Breath sounds: Normal breath sounds.  Musculoskeletal:     Cervical back: Normal range of motion and neck supple.     Comments: Normal Muscle Bulk and Muscle Testing Reveals:  Upper Extremities: Full ROM and  Muscle Strength 5/5 Bilateral AC Joint Tenderness Thoracic and Lumbar Hypersensitivity Right Greater Trochanter Tenderness Lower Extremities: Full ROM and Muscle Strength 5/5 Arises from Table with Ease  Narrow Based  Gait     Skin:    General: Skin is warm and dry.  Neurological:     Mental Status: He is alert and oriented to person, place,  and time.  Psychiatric:        Mood and Affect: Mood normal.        Behavior: Behavior normal.         Assessment & Plan:  1. Cervical postlaminectomy syndrome, Hx of ACDF C4-5, C6-7, C7-T1, 02/2008. Continue current medication and alternate using heat and ice therapy. 12/24/2021 2. Spondylosis of L-spine . Continue current treatment regime. 12/24/2021 3. Chronic Pain Syndrome Refilled: Xtampza 18 mg one capsule every 12 hours for pain #60 and Oxycodone 5/325mg  one tablet twice a day, may take a extra tablet when pain is sever # 75. Second script e-scribed to accommodate scheduled appointment.  Continue Voltaren Gel/ Flector Patch. 02//22/23. We will continue the opioid monitoring program, this consists of regular clinic visits, examinations, urine drug screen, pill counts as well as use of [place] Controlled Substance Reporting system. A 12 month History has been reviewed on the [place] Controlled Substance Reporting System on 12/24/2021. 4. Diabetes with neuropathy. Continue current medication regimen with  Gabapentin. Keep blood sugars under tight control. Continue to monitor. 12/24/2021 5. Cervicalgia/Cervical Radiculopathy/ Thoracic Radiculopathy/ Lumbar Radiculopathy:Continue Gabapentin.12/24/2021 6. Right Greater Trochanter Bursitis: Continue Ice/Heat and Current medication regimen. 12/24/2021 7. Chronic Pain Syndrome/ Myofascial Pain: Continue Flector Patch/ Alternates with Voltaren Gel. 12/24/2021 8. Sacroiliac Pain: Continue HEP as Tolerated. Continue to Monitor.12/24/2021 9. Chronic Right Ankle Pain: .Continue HEP as  tolerated. Continue to Monitor. 12/24/2021. 10. Post-Op Pain:  No complaints Today.  S/P LAPAROSCOPIC BILATERAL INGUINAL HERNIA REPAIR AND POSSIBLE APPENDECTOMY Dr Michaell Cowing Following.   11. Polyarthralgia: Continue HEP as Tolerated. Continue to monitor. 12/24/2021   F/U in 1 month

## 2021-12-28 ENCOUNTER — Encounter: Payer: Self-pay | Admitting: Registered Nurse

## 2022-01-20 ENCOUNTER — Other Ambulatory Visit: Payer: Self-pay

## 2022-01-20 ENCOUNTER — Encounter: Payer: No Typology Code available for payment source | Attending: Registered Nurse | Admitting: Registered Nurse

## 2022-01-20 VITALS — BP 128/79 | HR 79 | Ht 75.0 in | Wt 231.6 lb

## 2022-01-20 DIAGNOSIS — M25571 Pain in right ankle and joints of right foot: Secondary | ICD-10-CM | POA: Insufficient documentation

## 2022-01-20 DIAGNOSIS — Z79891 Long term (current) use of opiate analgesic: Secondary | ICD-10-CM | POA: Diagnosis present

## 2022-01-20 DIAGNOSIS — M546 Pain in thoracic spine: Secondary | ICD-10-CM | POA: Insufficient documentation

## 2022-01-20 DIAGNOSIS — M961 Postlaminectomy syndrome, not elsewhere classified: Secondary | ICD-10-CM | POA: Insufficient documentation

## 2022-01-20 DIAGNOSIS — M5412 Radiculopathy, cervical region: Secondary | ICD-10-CM | POA: Insufficient documentation

## 2022-01-20 DIAGNOSIS — M7061 Trochanteric bursitis, right hip: Secondary | ICD-10-CM | POA: Insufficient documentation

## 2022-01-20 DIAGNOSIS — G894 Chronic pain syndrome: Secondary | ICD-10-CM | POA: Diagnosis present

## 2022-01-20 DIAGNOSIS — M5416 Radiculopathy, lumbar region: Secondary | ICD-10-CM | POA: Diagnosis present

## 2022-01-20 DIAGNOSIS — Z5181 Encounter for therapeutic drug level monitoring: Secondary | ICD-10-CM | POA: Insufficient documentation

## 2022-01-20 DIAGNOSIS — M47817 Spondylosis without myelopathy or radiculopathy, lumbosacral region: Secondary | ICD-10-CM | POA: Diagnosis present

## 2022-01-20 DIAGNOSIS — G8929 Other chronic pain: Secondary | ICD-10-CM | POA: Diagnosis present

## 2022-01-20 DIAGNOSIS — M542 Cervicalgia: Secondary | ICD-10-CM | POA: Insufficient documentation

## 2022-01-20 MED ORDER — OXYCODONE-ACETAMINOPHEN 5-325 MG PO TABS: 1.0000 | ORAL_TABLET | Freq: Two times a day (BID) | ORAL | 0 refills | Status: DC

## 2022-01-20 MED ORDER — XTAMPZA ER 18 MG PO C12A: 1.0000 | EXTENDED_RELEASE_CAPSULE | Freq: Two times a day (BID) | ORAL | 0 refills | Status: DC

## 2022-01-20 NOTE — Progress Notes (Signed)
? ?Subjective:  ? ? Patient ID: David [name], male    DOB: 23-Sep-1976, 46 y.o.   MRN: [id number] ? ?HPI: David [name] is a 46 y.o. male who returns for follow up appointment for chronic pain and medication refill. He states his pain is located in his neck radiating into his left shoulder, left arm and left pinky with tingling, mid back pain, lower back pain radiating into his bilateral lower extremities L>R. Also right hip pain  and right ankle pain. He rates his pain 6. His current exercise regime is walking and performing stretching exercises. ? ?Mr. Pett Morphine equivalent is 78.75 MME. He is also prescribed Diazepam  by [facility] .We have discussed the black box warning of using opioids and benzodiazepines. I highlighted the dangers of using these drugs together and discussed the adverse events including respiratory suppression, overdose, cognitive impairment and importance of compliance with current regimen. We will continue to monitor and adjust as indicated.  ? ?  Last UDS was Performed on 11/19/2021, it was consistent.  ?  ? ?Pain Inventory ?Average Pain 5 ?Pain Right Now 6 ?My pain is intermittent, constant, sharp, burning, dull, stabbing, tingling, and aching ? ?In the last 24 hours, has pain interfered with the following? ?General activity 5 ?Relation with others 5 ?Enjoyment of life 5 ?What TIME of day is your pain at its worst? morning , daytime, evening, and night ?Sleep (in general) Fair ? ?Pain is worse with: walking, bending, sitting, inactivity, standing, and some activites ?Pain improves with: rest, heat/ice, therapy/exercise, pacing activities, medication, and TENS ?Relief from Meds: 5 ? ?Family History  ?Problem Relation Age of Onset  ? Arthritis Mother   ? Heart disease Mother   ? Diabetes Mother   ? Heart disease Father   ? Diabetes Father   ? ?Social History  ? ?Socioeconomic History  ? Marital status: Single  ?  Spouse name: Not on file  ? Number of children: Not on file  ?  Years of education: Not on file  ? Highest education level: Not on file  ?Occupational History  ? Not on file  ?Tobacco Use  ? Smoking status: Never  ? Smokeless tobacco: Never  ?Vaping Use  ? Vaping Use: Never used  ?Substance and Sexual Activity  ? Alcohol use: No  ? Drug use: Never  ? Sexual activity: Not on file  ?Other Topics Concern  ? Not on file  ?Social History Narrative  ? Not on file  ? ?Social Determinants of Health  ? ?Financial Resource Strain: Not on file  ?Food Insecurity: Not on file  ?Transportation Needs: Not on file  ?Physical Activity: Not on file  ?Stress: Not on file  ?Social Connections: Not on file  ? ?Past Surgical History:  ?Procedure Laterality Date  ? ANTERIOR CERVICAL DECOMP/DISCECTOMY FUSION  02-15-2008  @[facility]   ? C4--5,  C6--7, C7--T1  ? COLONOSCOPY    ? INGUINAL HERNIA REPAIR Bilateral 12/13/2020  ? Procedure: LAPAROSCOPIC BILATERAL INGUINAL HERNIA REPAIR AND POSSIBLE APPENDECTOMY;  Surgeon: Michael Boston, MD;  Location: [place];  Service: General;  Laterality: Bilateral;  ERAS  ? ?Past Surgical History:  ?Procedure Laterality Date  ? ANTERIOR CERVICAL DECOMP/DISCECTOMY FUSION  02-15-2008  @[facility]   ? C4--5,  C6--7, C7--T1  ? COLONOSCOPY    ? INGUINAL HERNIA REPAIR Bilateral 12/13/2020  ? Procedure: LAPAROSCOPIC BILATERAL INGUINAL HERNIA REPAIR AND POSSIBLE APPENDECTOMY;  Surgeon: Michael Boston, MD;  Location: [place];  Service: General;  Laterality: Bilateral;  ERAS  ? ?Past Medical History:  ?Diagnosis Date  ? Cervicalgia   ? Chronic pain syndrome   ? pain clinic  ? Complication of anesthesia   ? post op urinary retention after ACDF surgery 04/ 2009  ? 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 Family history of adverse reaction to anesthesia   ? father--- ponv  ? GERD (gastroesophageal reflux disease)   ? occasional, watches diet  ? Greater trochanteric bursitis of right hip   ? History of kidney stones   ? Hypertension   ?  Inguinal hernia, bilateral   ? Lumbar spondylosis   ? Nocturia   ? Peripheral neuropathy   ? bilateral lower legs and a little of left arm  ? Postlaminectomy syndrome, cervical region   ? Primary localized osteoarthrosis, lower leg   ? Radiculopathy, multiple sites in spine   ? cervical , thoracic, lumbar  ? Type 2 diabetes mellitus ([place])   ? followed by pcp---  (12-11-2020 checks blood sugar 3 times wkly,  fasting sugar--- 125 )  ? Wears glasses   ? ?There were no vitals taken for this visit. ? ?Opioid Risk Score:   ?Fall Risk Score:  `1 ? ?Depression screen PHQ 2/9 ? ?Depression screen [facility] 2/9 12/24/2021 11/19/2021 10/22/2021 09/03/2021 07/30/2021 06/04/2021 05/07/2021  ?Decreased Interest 0 0 0 1 1 1 1   ?Down, Depressed, Hopeless 0 0 0 1 1 1 1   ?PHQ - 2 Score 0 0 0 2 2 2 2   ?Altered sleeping - - - - - - -  ?Tired, decreased energy - - - - - - -  ?Change in appetite - - - - - - -  ?Feeling bad or failure about yourself  - - - - - - -  ?Trouble concentrating - - - - - - -  ?Moving slowly or fidgety/restless - - - - - - -  ?Suicidal thoughts - - - - - - -  ?PHQ-9 Score - - - - - - -  ?Difficult doing work/chores - - - - - - -  ?Some recent data might be hidden  ?  ? ?Review of Systems  ?Musculoskeletal:  Positive for back pain.  ?     Left arm pain ?Bilateral leg pain  ?All other systems reviewed and are negative. ? ?   ?Objective:  ? Physical Exam ?Vitals and nursing note reviewed.  ?Constitutional:   ?   Appearance: Normal appearance.  ?Neck:  ?   Comments: Cervical Paraspinal Tenderness: C-5-C-6 ?Cardiovascular:  ?   Rate and Rhythm: Normal rate and regular rhythm.  ?   Pulses: Normal pulses.  ?   Heart sounds: Normal heart sounds.  ?Pulmonary:  ?   Effort: Pulmonary effort is normal.  ?   Breath sounds: Normal breath sounds.  ?Musculoskeletal:  ?   Cervical back: Normal range of motion and neck supple.  ?   Comments: Normal Muscle Bulk and Muscle Testing Reveals:  ?Upper Extremities: Full ROM and Muscle Strength  5/5 ?Bilateral AC Joint Tenderness ?Thoracic Paraspinal Tenderness: T-7-T-9 ?Lumbar Paraspinal Tenderness: L-4-L-5 ?Right Greater Trochanter Tenderness ?Lower Extremities: Full ROM and Muscle Strength 5/5 ?Arises from Chair with ease ?Narrow Based  Gait  ?   ?Skin: ?   General: Skin is warm and dry.  ?Neurological:  ?   Mental Status: He is alert and oriented to person, place, and time.  ?Psychiatric:     ?   Mood and  Affect: Mood normal.     ?   Behavior: Behavior normal.  ? ? ? ? ?   ?Assessment & Plan:  ?1. Cervical postlaminectomy syndrome, Hx of ACDF C4-5, C6-7, C7-T1, 02/2008. Continue current medication and alternate using heat and ice therapy. 01/20/2022 ?2. Spondylosis of L-spine . Continue current treatment regime. 01/20/2022 ?3. Chronic Pain Syndrome Refilled: Xtampza 18 mg one capsule every 12 hours for pain #60 and Oxycodone 5/325mg  one tablet twice a day, may take a extra tablet when pain is sever # 75. Second script e-scribed to accommodate scheduled appointment.  Continue Voltaren Gel/ Flector Patch. 01//18/23. We will continue the opioid monitoring program, this consists of regular clinic visits, examinations, urine drug screen, pill counts as well as use of [place] Controlled Substance Reporting system. A 12 month History has been reviewed on the [place] Controlled Substance Reporting System on 01/20/2022. ?4. Diabetes with neuropathy. Continue current medication regimen with  Gabapentin. Keep blood sugars under tight control. Continue to monitor. 01/20/2022 ?5. Cervicalgia/Cervical Radiculopathy/ Thoracic Radiculopathy/ Lumbar Radiculopathy:Continue Gabapentin.01/20/2022 ?6. Right Greater Trochanter Bursitis: Continue Ice/Heat and Current medication regimen. 01/20/2022 ?7. Chronic Pain Syndrome/ Myofascial Pain: Continue Flector Patch/ Alternates with Voltaren Gel. 01/20/2022 ?8. Sacroiliac Pain: Continue HEP as Tolerated. Continue to Monitor.01/20/2022 ?9. Chronic Right Ankle Pain:  .Continue HEP as tolerated. Continue to Monitor. 01/20/2022. ?10. Post-Op Pain:  No complaints Today.  ?S/P LAPAROSCOPIC BILATERAL INGUINAL HERNIA REPAIR AND POSSIBLE APPENDECTOMY ?Dr Johney Maine Following.   ?11. Polyarthr

## 2022-01-21 ENCOUNTER — Ambulatory Visit: Payer: No Typology Code available for payment source | Admitting: Registered Nurse

## 2022-01-27 ENCOUNTER — Encounter: Payer: Self-pay | Admitting: Registered Nurse

## 2022-01-29 MED ORDER — OXYCODONE-ACETAMINOPHEN 5-325 MG PO TABS: 1.0000 | ORAL_TABLET | Freq: Two times a day (BID) | ORAL | 0 refills | Status: DC

## 2022-02-25 ENCOUNTER — Encounter
Payer: No Typology Code available for payment source | Attending: Physical Medicine & Rehabilitation | Admitting: Registered Nurse

## 2022-02-25 ENCOUNTER — Encounter: Payer: Self-pay | Admitting: Registered Nurse

## 2022-02-25 VITALS — BP 120/77 | HR 85 | Ht 75.0 in | Wt 233.0 lb

## 2022-02-25 DIAGNOSIS — M47817 Spondylosis without myelopathy or radiculopathy, lumbosacral region: Secondary | ICD-10-CM | POA: Diagnosis present

## 2022-02-25 DIAGNOSIS — M542 Cervicalgia: Secondary | ICD-10-CM | POA: Insufficient documentation

## 2022-02-25 DIAGNOSIS — M5416 Radiculopathy, lumbar region: Secondary | ICD-10-CM | POA: Insufficient documentation

## 2022-02-25 DIAGNOSIS — G894 Chronic pain syndrome: Secondary | ICD-10-CM | POA: Insufficient documentation

## 2022-02-25 DIAGNOSIS — M7061 Trochanteric bursitis, right hip: Secondary | ICD-10-CM | POA: Diagnosis present

## 2022-02-25 DIAGNOSIS — Z79891 Long term (current) use of opiate analgesic: Secondary | ICD-10-CM | POA: Diagnosis present

## 2022-02-25 DIAGNOSIS — M25571 Pain in right ankle and joints of right foot: Secondary | ICD-10-CM | POA: Diagnosis present

## 2022-02-25 DIAGNOSIS — Z5181 Encounter for therapeutic drug level monitoring: Secondary | ICD-10-CM | POA: Insufficient documentation

## 2022-02-25 DIAGNOSIS — G8929 Other chronic pain: Secondary | ICD-10-CM | POA: Insufficient documentation

## 2022-02-25 DIAGNOSIS — M5412 Radiculopathy, cervical region: Secondary | ICD-10-CM | POA: Diagnosis present

## 2022-02-25 DIAGNOSIS — M546 Pain in thoracic spine: Secondary | ICD-10-CM | POA: Diagnosis present

## 2022-02-25 DIAGNOSIS — M961 Postlaminectomy syndrome, not elsewhere classified: Secondary | ICD-10-CM | POA: Insufficient documentation

## 2022-02-25 MED ORDER — OXYCODONE-ACETAMINOPHEN 5-325 MG PO TABS: 1.0000 | ORAL_TABLET | Freq: Two times a day (BID) | ORAL | 0 refills | Status: DC

## 2022-02-25 MED ORDER — GABAPENTIN 100 MG PO CAPS: 100.0000 mg | ORAL_CAPSULE | Freq: Four times a day (QID) | ORAL | 1 refills | Status: DC

## 2022-02-25 MED ORDER — XTAMPZA ER 18 MG PO C12A: 1.0000 | EXTENDED_RELEASE_CAPSULE | Freq: Two times a day (BID) | ORAL | 0 refills | Status: DC

## 2022-02-25 MED ORDER — GABAPENTIN 400 MG PO CAPS: 400.0000 mg | ORAL_CAPSULE | Freq: Four times a day (QID) | ORAL | 1 refills | Status: DC

## 2022-02-25 NOTE — Progress Notes (Signed)
? ?Subjective:  ? ? Patient ID: David [name], male    DOB: [birth year], 46 y.o.   MRN: [id number] ? ?HPI: David [name] is a 46 y.o. male who returns for follow up appointment for chronic pain and medication refill. He states his pain is located in his neck radiating into his head ( occipital region) and radiating into his bilateral shoulders L>R, radiating into his left arm , left pinky and ring finger. He also reports upper-lower back pain radiating into his right hip and bilateral lower extremities and right ankle pain. He rates his pain 5. His current exercise regime is walking and performing stretching exercises. ? ?David [name] Morphine equivalent is 82.63 MME He is also prescribed Diazepam by Heide Scales NP .We have discussed the black box warning of using opioids and benzodiazepines. I highlighted the dangers of using these drugs together and discussed the adverse events including respiratory suppression, overdose, cognitive impairment and importance of compliance with current regimen. We will continue to monitor and adjust as indicated.  ?   ?Last UDS was Performed on 11/19/2021, it was consistent.  ?  ? ?Pain Inventory ?Average Pain 5 ?Pain Right Now 5 ?My pain is intermittent, constant, sharp, burning, dull, stabbing, tingling, and aching ? ?In the last 24 hours, has pain interfered with the following? ?General activity 5 ?Relation with others 4 ?Enjoyment of life 5 ?What TIME of day is your pain at its worst? morning , daytime, evening, and night ?Sleep (in general) Fair ? ?Pain is worse with: walking, bending, sitting, inactivity, standing, and some activites ?Pain improves with: rest, heat/ice, therapy/exercise, pacing activities, medication, and TENS ?Relief from Meds: 5 ? ?Family History  ?Problem Relation Age of Onset  ? Arthritis Mother   ? Heart disease Mother   ? Diabetes Mother   ? Heart disease Father   ? Diabetes Father   ? ?Social History  ? ?Socioeconomic History  ? Marital status:  Single  ?  Spouse name: Not on file  ? Number of children: Not on file  ? Years of education: Not on file  ? Highest education level: Not on file  ?Occupational History  ? Not on file  ?Tobacco Use  ? Smoking status: Never  ? Smokeless tobacco: Never  ?Vaping Use  ? Vaping Use: Never used  ?Substance and Sexual Activity  ? Alcohol use: No  ? Drug use: Never  ? Sexual activity: Not on file  ?Other Topics Concern  ? Not on file  ?Social History Narrative  ? Not on file  ? ?Social Determinants of Health  ? ?Financial Resource Strain: Not on file  ?Food Insecurity: Not on file  ?Transportation Needs: Not on file  ?Physical Activity: Not on file  ?Stress: Not on file  ?Social Connections: Not on file  ? ?Past Surgical History:  ?Procedure Laterality Date  ? ANTERIOR CERVICAL DECOMP/DISCECTOMY FUSION  02-15-2008  @[facility]   ? C4--5,  C6--7, C7--T1  ? COLONOSCOPY    ? INGUINAL HERNIA REPAIR Bilateral 12/13/2020  ? Procedure: LAPAROSCOPIC BILATERAL INGUINAL HERNIA REPAIR AND POSSIBLE APPENDECTOMY;  Surgeon: Michael Boston, MD;  Location: [place];  Service: General;  Laterality: Bilateral;  ERAS  ? ?Past Surgical History:  ?Procedure Laterality Date  ? ANTERIOR CERVICAL DECOMP/DISCECTOMY FUSION  02-15-2008  @[facility]   ? C4--5,  C6--7, C7--T1  ? COLONOSCOPY    ? INGUINAL HERNIA REPAIR Bilateral 12/13/2020  ? Procedure: LAPAROSCOPIC BILATERAL INGUINAL HERNIA REPAIR AND POSSIBLE APPENDECTOMY;  Surgeon: Michael Boston,  MD;  Location: [place];  Service: General;  Laterality: Bilateral;  ERAS  ? ?Past Medical History:  ?Diagnosis Date  ? Cervicalgia   ? Chronic pain syndrome   ? pain clinic  ? Complication of anesthesia   ? post op urinary retention after ACDF surgery 04/ 2009  ? 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 Family history of adverse reaction to anesthesia   ? father--- ponv  ? GERD (gastroesophageal reflux disease)   ? occasional, watches diet  ? Greater trochanteric  bursitis of right hip   ? History of kidney stones   ? Hypertension   ? Inguinal hernia, bilateral   ? Lumbar spondylosis   ? Nocturia   ? Peripheral neuropathy   ? bilateral lower legs and a little of left arm  ? Postlaminectomy syndrome, cervical region   ? Primary localized osteoarthrosis, lower leg   ? Radiculopathy, multiple sites in spine   ? cervical , thoracic, lumbar  ? Type 2 diabetes mellitus ([place])   ? followed by pcp---  (12-11-2020 checks blood sugar 3 times wkly,  fasting sugar--- 125 )  ? Wears glasses   ? ?There were no vitals taken for this visit. ? ?Opioid Risk Score:   ?Fall Risk Score:  `1 ? ?Depression screen PHQ 2/9 ? ? ?  12/24/2021  ?  3:09 PM 11/19/2021  ?  3:14 PM 10/22/2021  ?  3:31 PM 09/03/2021  ?  3:23 PM 07/30/2021  ?  3:15 PM 06/04/2021  ?  3:05 PM 05/07/2021  ?  3:23 PM  ?Depression screen PHQ 2/9  ?Decreased Interest 0 0 0 1 1 1 1   ?Down, Depressed, Hopeless 0 0 0 1 1 1 1   ?PHQ - 2 Score 0 0 0 2 2 2 2   ?  ? ?Review of Systems  ?Musculoskeletal:   ?     Left arm pain  ?Bilateral leg pain  ?All other systems reviewed and are negative. ? ?   ?Objective:  ? Physical Exam ?Vitals and nursing note reviewed.  ?Constitutional:   ?   Appearance: Normal appearance.  ?Neck:  ?   Comments: Cervical Paraspinal Tenderness: C-1- C-6 ?Cardiovascular:  ?   Rate and Rhythm: Normal rate and regular rhythm.  ?   Pulses: Normal pulses.  ?   Heart sounds: Normal heart sounds.  ?Pulmonary:  ?   Effort: Pulmonary effort is normal.  ?   Breath sounds: Normal breath sounds.  ?Musculoskeletal:  ?   Cervical back: Normal range of motion and neck supple.  ?   Comments: Normal Muscle Bulk and Muscle Testing Reveals:  ?Upper Extremities: Full ROM and Muscle Strength 5/5 ?Bilateral AC Joint Tenderness ?Thoracic and Lumbar Hypersensitivity ?Lower Extremities: Full ROM and Muscle Strength 5/5 ?Bilateral Lower Extremity Flexion Produces Pain into his Bilateral Patella's ?Arises from chair with ease ?Narrow Based  Gait  ?    ?Skin: ?   General: Skin is warm and dry.  ?Neurological:  ?   Mental Status: He is alert and oriented to person, place, and time.  ?Psychiatric:     ?   Mood and Affect: Mood normal.     ?   Behavior: Behavior normal.  ? ? ? ? ?   ?Assessment & Plan:  ?1. Cervical postlaminectomy syndrome, Hx of ACDF C4-5, C6-7, C7-T1, 02/2008. Continue current medication and alternate using heat and ice therapy. 02/25/2022 ?2. Spondylosis of L-spine . Continue current treatment regime. 02/25/2022 ?  3. Chronic Pain Syndrome Refilled: Xtampza 18 mg one capsule every 12 hours for pain #60 and Oxycodone 5/325mg  one tablet twice a day, may take a extra tablet when pain is sever # 75. Second script e-scribed to accommodate scheduled appointment.  Continue Voltaren Gel/ Flector Patch. 01//18/23. We will continue the opioid monitoring program, this consists of regular clinic visits, examinations, urine drug screen, pill counts as well as use of [place] Controlled Substance Reporting system. A 12 month History has been reviewed on the [place] Controlled Substance Reporting System on 02/25/2022. ?4. Diabetes with neuropathy. Continue current medication regimen with  Gabapentin. Keep blood sugars under tight control. Continue to monitor. 02/25/2022 ?5. Cervicalgia/Cervical Radiculopathy/ Thoracic Radiculopathy/ Lumbar Radiculopathy:Continue Gabapentin.02/25/2022 ?6. Right Greater Trochanter Bursitis: Continue Ice/Heat and Current medication regimen. 02/25/2022 ?7. Chronic Pain Syndrome/ Myofascial Pain: Continue Flector Patch/ Alternates with Voltaren Gel. 02/25/2022 ?8. Sacroiliac Pain: Continue HEP as Tolerated. Continue to Monitor.02/25/2022 ?9. Chronic Right Ankle Pain: .Continue HEP as tolerated. Continue to Monitor. 02/25/2022. ?10. Post-Op Pain:  No complaints Today.  ?S/P LAPAROSCOPIC BILATERAL INGUINAL HERNIA REPAIR AND POSSIBLE APPENDECTOMY ?Dr Johney Maine Following.   ?11. Polyarthralgia: Continue HEP as Tolerated. Continue  to monitor. 02/25/2022 ?  ?F/U in 1 month ?  ?  ? ?

## 2022-03-25 ENCOUNTER — Encounter
Payer: No Typology Code available for payment source | Attending: Physical Medicine & Rehabilitation | Admitting: Registered Nurse

## 2022-03-25 VITALS — BP 117/76 | HR 83 | Ht 75.0 in | Wt 233.8 lb

## 2022-03-25 DIAGNOSIS — M961 Postlaminectomy syndrome, not elsewhere classified: Secondary | ICD-10-CM | POA: Diagnosis present

## 2022-03-25 DIAGNOSIS — Z79891 Long term (current) use of opiate analgesic: Secondary | ICD-10-CM | POA: Diagnosis present

## 2022-03-25 DIAGNOSIS — M7061 Trochanteric bursitis, right hip: Secondary | ICD-10-CM | POA: Diagnosis present

## 2022-03-25 DIAGNOSIS — G894 Chronic pain syndrome: Secondary | ICD-10-CM | POA: Diagnosis present

## 2022-03-25 DIAGNOSIS — M25571 Pain in right ankle and joints of right foot: Secondary | ICD-10-CM | POA: Insufficient documentation

## 2022-03-25 DIAGNOSIS — Z5181 Encounter for therapeutic drug level monitoring: Secondary | ICD-10-CM | POA: Diagnosis present

## 2022-03-25 DIAGNOSIS — M5416 Radiculopathy, lumbar region: Secondary | ICD-10-CM | POA: Insufficient documentation

## 2022-03-25 DIAGNOSIS — G8929 Other chronic pain: Secondary | ICD-10-CM | POA: Insufficient documentation

## 2022-03-25 DIAGNOSIS — M546 Pain in thoracic spine: Secondary | ICD-10-CM | POA: Insufficient documentation

## 2022-03-25 DIAGNOSIS — M5412 Radiculopathy, cervical region: Secondary | ICD-10-CM | POA: Diagnosis present

## 2022-03-25 DIAGNOSIS — M47817 Spondylosis without myelopathy or radiculopathy, lumbosacral region: Secondary | ICD-10-CM | POA: Insufficient documentation

## 2022-03-25 DIAGNOSIS — M542 Cervicalgia: Secondary | ICD-10-CM | POA: Insufficient documentation

## 2022-03-25 MED ORDER — XTAMPZA ER 18 MG PO C12A: 1.0000 | EXTENDED_RELEASE_CAPSULE | Freq: Two times a day (BID) | ORAL | 0 refills | Status: DC

## 2022-03-25 MED ORDER — OXYCODONE-ACETAMINOPHEN 5-325 MG PO TABS: 1.0000 | ORAL_TABLET | Freq: Two times a day (BID) | ORAL | 0 refills | Status: DC

## 2022-03-25 NOTE — Progress Notes (Signed)
Subjective:    Patient ID: David [name], male    DOB: [birth year], 46 y.o.   MRN: [id number]  HPI: David [name] is a 46 y.o. male who returns for follow up appointment for chronic pain and medication refill. He states his pain is located in his neck radiating into his bilateral shoulders, mid- lower back radiating into his bilateral hips and bilateral lower extremities. He also reports right ankle He rates his pain 5. He also reports left arm, left pinky and left ring finger with tingling. He rates his pain 5. His current exercise regime is walking and performing stretching exercises.   David [name] Morphine equivalent is 76.75 MME.   UDS ordered today.   Pain Inventory Average Pain 5 Pain Right Now 5 My pain is intermittent, constant, sharp, burning, dull, stabbing, tingling, and aching  In the last 24 hours, has pain interfered with the following? General activity 5 Relation with others 4 Enjoyment of life 5 What TIME of day is your pain at its worst? morning , daytime, evening, and night Sleep (in general) Fair  Pain is worse with: walking, bending, sitting, inactivity, standing, and some activites Pain improves with: rest, heat/ice, therapy/exercise, pacing activities, medication, and TENS Relief from Meds: 5  Family History  Problem Relation Age of Onset   Arthritis Mother    Heart disease Mother    Diabetes Mother    Heart disease Father    Diabetes Father    Social History   Socioeconomic History   Marital status: Single    Spouse name: Not on file   Number of children: Not on file   Years of education: Not on file   Highest education level: Not on file  Occupational History   Not on file  Tobacco Use   Smoking status: Never   Smokeless tobacco: Never  Vaping Use   Vaping Use: Never used  Substance and Sexual Activity   Alcohol use: No   Drug use: Never   Sexual activity: Not on file  Other Topics Concern   Not on file  Social History Narrative    Not on file   Social Determinants of Health   Financial Resource Strain: Not on file  Food Insecurity: Not on file  Transportation Needs: Not on file  Physical Activity: Not on file  Stress: Not on file  Social Connections: Not on file   Past Surgical History:  Procedure Laterality Date   ANTERIOR CERVICAL DECOMP/DISCECTOMY FUSION  02-15-2008  @[facility]    C4--5,  C6--7, C7--T1   COLONOSCOPY     INGUINAL HERNIA REPAIR Bilateral 12/13/2020   Procedure: LAPAROSCOPIC BILATERAL INGUINAL HERNIA REPAIR AND POSSIBLE APPENDECTOMY;  Surgeon: 02/10/2021, MD;  Location: [facility];  Service: General;  Laterality: Bilateral;  ERAS   Past Surgical History:  Procedure Laterality Date   ANTERIOR CERVICAL DECOMP/DISCECTOMY FUSION  02-15-2008  @[facility]    C4--5,  C6--7, C7--T1   COLONOSCOPY     INGUINAL HERNIA REPAIR Bilateral 12/13/2020   Procedure: LAPAROSCOPIC BILATERAL INGUINAL HERNIA REPAIR AND POSSIBLE APPENDECTOMY;  Surgeon: , MD;  Location: [facility];  Service: General;  Laterality: Bilateral;  ERAS   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    Family history of adverse  reaction to anesthesia    father--- ponv   GERD (gastroesophageal reflux disease)    occasional, watches diet   Greater trochanteric bursitis of right hip    History of kidney stones    Hypertension    Inguinal hernia, bilateral    Lumbar spondylosis    Nocturia    Peripheral neuropathy    bilateral lower legs and a little of left arm   Postlaminectomy syndrome, cervical region    Primary localized osteoarthrosis, lower leg    Radiculopathy, multiple sites in spine    cervical , thoracic, lumbar   Type 2 diabetes mellitus (HCC)    followed by pcp---  (12-11-2020 checks blood  sugar 3 times wkly,  fasting sugar--- 125 )   Wears glasses    BP 117/76   Pulse 83   Ht 6\' 3"  (1.905 m)   Wt 233 lb 12.8 oz (106.1 kg)   SpO2 96%   BMI 29.22 kg/m   Opioid Risk Score:   Fall Risk Score:  `1  [facility]     12/24/2021    3:09 PM 11/19/2021    3:14 PM 10/22/2021    3:31 PM 09/03/2021    3:23 PM 07/30/2021    3:15 PM 06/04/2021    3:05 PM 05/07/2021    3:23 PM  Depression screen PHQ 2/9  Decreased Interest 0 0 0 1 1 1 1   Down, Depressed, Hopeless 0 0 0 1 1 1 1   PHQ - 2 Score 0 0 0 2 2 2 2      Review of Systems  Musculoskeletal:  Positive for back pain.       Bilateral leg pain Left arm pain  All other systems reviewed and are negative.     Objective:   Physical Exam Vitals and nursing note reviewed.  Constitutional:      Appearance: Normal appearance.  Cardiovascular:     Rate and Rhythm: Normal rate and regular rhythm.     Pulses: Normal pulses.     Heart sounds: Normal heart sounds.  Pulmonary:     Effort: Pulmonary effort is normal.     Breath sounds: Normal breath sounds.  Musculoskeletal:     Cervical back: Normal range of motion and neck supple.     Comments: Normal Muscle Bulk and Muscle Testing Reveals:  Upper Extremities: Full ROM and Muscle Strength 5/5 Bilateral AC Joint Tenderness Thoracic and Lumbar Hypersensitivity Right Greater Trochanter Tenderness Lower Extremities: Full ROM and Muscle Strength 5/5 Arises from chair with ease Narrow Based  Gait     Skin:    General: Skin is warm and dry.  Neurological:     Mental Status: He is alert and oriented to person, place, and time.  Psychiatric:        Mood and Affect: Mood normal.        Behavior: Behavior normal.         Assessment & Plan:  1. Cervical postlaminectomy syndrome, Hx of ACDF C4-5, C6-7, C7-T1, 02/2008. Continue current medication and alternate using heat and ice therapy. 03/25/2022 2. Spondylosis of L-spine . Continue current treatment regime.  03/25/2022 3. Chronic Pain Syndrome Refilled: Xtampza 18 mg one capsule every 12 hours for pain #60 and Oxycodone 5/325mg  one tablet twice a day, may take a extra tablet when pain is sever # 75. Second script e-scribed to accommodate scheduled appointment.  Continue Voltaren Gel/ Flector Patch. 01//18/23. We will continue the opioid monitoring program, this consists of regular  clinic visits, examinations, urine drug screen, pill counts as well as use of [place] Controlled Substance Reporting system. A 12 month History has been reviewed on the [place] Controlled Substance Reporting System on 03/25/2022. 4. Diabetes with neuropathy. Continue current medication regimen with  Gabapentin. Keep blood sugars under tight control. Continue to monitor. 03/25/2022 5. Cervicalgia/Cervical Radiculopathy/ Thoracic Radiculopathy/ Lumbar Radiculopathy:Continue Gabapentin.03/25/2022 6. Right Greater Trochanter Bursitis: Continue Ice/Heat and Current medication regimen. 03/25/2022 7. Chronic Pain Syndrome/ Myofascial Pain: Continue Flector Patch/ Alternates with Voltaren Gel. 03/25/2022 8. Sacroiliac Pain: Continue HEP as Tolerated. Continue to Monitor.03/25/2022 9. Chronic Right Ankle Pain: .Continue HEP as tolerated. Continue to Monitor. 03/25/2022. 10. Post-Op Pain:  No complaints Today.  S/P LAPAROSCOPIC BILATERAL INGUINAL HERNIA REPAIR AND POSSIBLE APPENDECTOMY Dr Michaell Cowing Following.   11. Polyarthralgia: Continue HEP as Tolerated. Continue to monitor. 03/25/2022   F/U in 1 month

## 2022-03-29 LAB — TOXASSURE SELECT,+ANTIDEPR,UR

## 2022-04-01 ENCOUNTER — Telehealth: Payer: Self-pay | Admitting: *Deleted

## 2022-04-01 ENCOUNTER — Encounter: Payer: Self-pay | Admitting: Registered Nurse

## 2022-04-01 NOTE — Telephone Encounter (Signed)
Urine drug screen for this encounter is consistent for prescribed medication 

## 2022-04-14 ENCOUNTER — Telehealth: Payer: Self-pay | Admitting: Registered Nurse

## 2022-04-14 MED ORDER — XTAMPZA ER 18 MG PO C12A: 1.0000 | EXTENDED_RELEASE_CAPSULE | Freq: Two times a day (BID) | ORAL | 0 refills | Status: DC

## 2022-04-14 MED ORDER — OXYCODONE-ACETAMINOPHEN 5-325 MG PO TABS: 1.0000 | ORAL_TABLET | Freq: Two times a day (BID) | ORAL | 0 refills | Status: DC

## 2022-04-14 NOTE — Telephone Encounter (Signed)
PMP was Reviewed.  Xtampza and Oxycodone e-scribed to accommodate scheduled appointment. Mr. Gaddie is aware via My-Chart Message

## 2022-04-22 ENCOUNTER — Ambulatory Visit: Payer: No Typology Code available for payment source | Admitting: Registered Nurse

## 2022-05-20 ENCOUNTER — Encounter: Payer: Self-pay | Admitting: Registered Nurse

## 2022-05-20 ENCOUNTER — Encounter
Payer: No Typology Code available for payment source | Attending: Physical Medicine & Rehabilitation | Admitting: Registered Nurse

## 2022-05-20 VITALS — BP 113/76 | HR 88 | Ht 75.0 in | Wt 235.0 lb

## 2022-05-20 DIAGNOSIS — M542 Cervicalgia: Secondary | ICD-10-CM | POA: Diagnosis not present

## 2022-05-20 DIAGNOSIS — M5412 Radiculopathy, cervical region: Secondary | ICD-10-CM | POA: Insufficient documentation

## 2022-05-20 DIAGNOSIS — Z5181 Encounter for therapeutic drug level monitoring: Secondary | ICD-10-CM | POA: Diagnosis present

## 2022-05-20 DIAGNOSIS — M546 Pain in thoracic spine: Secondary | ICD-10-CM | POA: Diagnosis not present

## 2022-05-20 DIAGNOSIS — M5416 Radiculopathy, lumbar region: Secondary | ICD-10-CM | POA: Insufficient documentation

## 2022-05-20 DIAGNOSIS — M961 Postlaminectomy syndrome, not elsewhere classified: Secondary | ICD-10-CM | POA: Insufficient documentation

## 2022-05-20 DIAGNOSIS — G8929 Other chronic pain: Secondary | ICD-10-CM | POA: Diagnosis present

## 2022-05-20 DIAGNOSIS — Z79891 Long term (current) use of opiate analgesic: Secondary | ICD-10-CM | POA: Diagnosis present

## 2022-05-20 DIAGNOSIS — G894 Chronic pain syndrome: Secondary | ICD-10-CM | POA: Insufficient documentation

## 2022-05-20 DIAGNOSIS — M7061 Trochanteric bursitis, right hip: Secondary | ICD-10-CM | POA: Insufficient documentation

## 2022-05-20 DIAGNOSIS — M25571 Pain in right ankle and joints of right foot: Secondary | ICD-10-CM | POA: Diagnosis present

## 2022-05-20 DIAGNOSIS — M47817 Spondylosis without myelopathy or radiculopathy, lumbosacral region: Secondary | ICD-10-CM | POA: Insufficient documentation

## 2022-05-20 MED ORDER — XTAMPZA ER 18 MG PO C12A: 1.0000 | EXTENDED_RELEASE_CAPSULE | Freq: Two times a day (BID) | ORAL | 0 refills | Status: DC

## 2022-05-20 MED ORDER — OXYCODONE-ACETAMINOPHEN 5-325 MG PO TABS: 1.0000 | ORAL_TABLET | Freq: Two times a day (BID) | ORAL | 0 refills | Status: DC

## 2022-05-20 NOTE — Progress Notes (Signed)
Subjective:    Patient ID: David [name], male    DOB: 07-29-1976, 46 y.o.   MRN: [id number]  HPI: David [name] is a 46 y.o. male who returns for follow up appointment for chronic pain and medication refill. He states his pain is located in his neck radiating into his bilateral shoulders, left arm, pinky and ring finger, upper- lower back pain radiating into her lower extremities L>R. Also reports right hip pain and right ankle pain. He rates his pain 5. His current exercise regime is walking and performing stretching exercises.  David [name] Morphine equivalent is 82.62 MME.   Last UDS Performed on 03/25/2022, it was consistent.      Pain Inventory Average Pain 5 Pain Right Now 5 My pain is intermittent, constant, sharp, burning, dull, stabbing, tingling, and aching  In the last 24 hours, has pain interfered with the following? General activity 5 Relation with others 5 Enjoyment of life 5 What TIME of day is your pain at its worst? morning , daytime, evening, and night Sleep (in general) Fair  Pain is worse with: walking, bending, sitting, inactivity, standing, and some activites Pain improves with: rest, heat/ice, therapy/exercise, pacing activities, medication, and TENS Relief from Meds: 5      Family History  Problem Relation Age of Onset   Arthritis Mother    Heart disease Mother    Diabetes Mother    Heart disease Father    Diabetes Father    Social History   Socioeconomic History   Marital status: Single    Spouse name: Not on file   Number of children: Not on file   Years of education: Not on file   Highest education level: Not on file  Occupational History   Not on file  Tobacco Use   Smoking status: Never   Smokeless tobacco: Never  Vaping Use   Vaping Use: Never used  Substance and Sexual Activity   Alcohol use: No   Drug use: Never   Sexual activity: Not on file  Other Topics Concern   Not on file  Social History Narrative   Not on file    Social Determinants of Health   Financial Resource Strain: Not on file  Food Insecurity: Not on file  Transportation Needs: Not on file  Physical Activity: Not on file  Stress: Not on file  Social Connections: Not on file   Past Surgical History:  Procedure Laterality Date   ANTERIOR CERVICAL DECOMP/DISCECTOMY FUSION  02-15-2008  @[facility]    C4--5,  C6--7, C7--T1   COLONOSCOPY     INGUINAL HERNIA REPAIR Bilateral 12/13/2020   Procedure: LAPAROSCOPIC BILATERAL INGUINAL HERNIA REPAIR AND POSSIBLE APPENDECTOMY;  Surgeon: 02/10/2021, MD;  Location: [facility];  Service: General;  Laterality: Bilateral;  ERAS   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    Family history of adverse reaction to anesthesia    father--- ponv   GERD (gastroesophageal reflux disease)    occasional, watches diet   Greater trochanteric bursitis of right hip    History of kidney stones    Hypertension    Inguinal hernia, bilateral    Lumbar spondylosis    Nocturia    Peripheral neuropathy    bilateral lower legs and  a little of left arm   Postlaminectomy syndrome, cervical region    Primary localized osteoarthrosis, lower leg    Radiculopathy, multiple sites in spine    cervical , thoracic, lumbar   Type 2 diabetes mellitus (HCC)    followed by pcp---  (12-11-2020 checks blood sugar 3 times wkly,  fasting sugar--- 125 )   Wears glasses    BP 113/76   Pulse 88   Ht 6\' 3"  (1.905 m)   Wt 235 lb (106.6 kg)   SpO2 96%   BMI 29.37 kg/m   Opioid Risk Score:   Fall Risk Score:  `1  Depression screen [facility] 2/9     12/24/2021    3:09 PM 11/19/2021    3:14 PM 10/22/2021    3:31 PM 09/03/2021    3:23 PM 07/30/2021    3:15 PM 06/04/2021    3:05 PM 05/07/2021    3:23 PM  Depression screen PHQ  2/9  Decreased Interest 0 0 0 1 1 1 1   Down, Depressed, Hopeless 0 0 0 1 1 1 1   PHQ - 2 Score 0 0 0 2 2 2 2     Review of Systems  Musculoskeletal:  Positive for arthralgias and back pain.       Spasms, tingling, pain in the left arm and both legs down to both feet  Neurological:  Positive for numbness.  Psychiatric/Behavioral:         Depression, anxiety  All other systems reviewed and are negative.      Objective:   Physical Exam Vitals and nursing note reviewed.  Constitutional:      Appearance: Normal appearance.  Cardiovascular:     Rate and Rhythm: Normal rate and regular rhythm.     Pulses: Normal pulses.     Heart sounds: Normal heart sounds.  Pulmonary:     Effort: Pulmonary effort is normal.     Breath sounds: Normal breath sounds.  Musculoskeletal:     Cervical back: Normal range of motion and neck supple.     Comments: Normal Muscle Bulk and Muscle Testing Reveals:  Upper Extremities:Upper Full  ROM and Muscle Strength 5/5 Bilateral AC Joint Tenderness Thoracic and Lumbar Paraspinal Tenderness: L-3-L-5 Right Greater Trochanter Tenderness  Lower Extremities: Full ROM and Muscle Strength 5/5 Arises from Table with Ease  Narrow Based  Gait     Skin:    General: Skin is warm and dry.  Neurological:     Mental Status: He is alert and oriented to person, place, and time.  Psychiatric:        Mood and Affect: Mood normal.        Behavior: Behavior normal.         Assessment & Plan:  1. Cervical postlaminectomy syndrome, Hx of ACDF C4-5, C6-7, C7-T1, 02/2008. Continue current medication and alternate using heat and ice therapy. 05/20/2022 2. Spondylosis of L-spine . Continue current treatment regime. 05/20/2022 3. Chronic Pain Syndrome Refilled: Xtampza 18 mg one capsule every 12 hours for pain #60 and Oxycodone 5/325mg  one tablet twice a day, may take a extra tablet when pain is sever # 75. Second script e-scribed to accommodate scheduled appointment.   Continue Voltaren Gel/ Flector Patch. 01//18/23. We will continue the opioid monitoring program, this consists of regular clinic visits, examinations, urine drug screen, pill counts as well as use of 03/2008 Controlled Substance Reporting system. A 12 month History has been reviewed on the 05[place]/20[place] Controlled Substance Reporting System on  05/20/2022. 4. Diabetes with neuropathy. Continue current medication regimen with  Gabapentin. Keep blood sugars under tight control. Continue to monitor. 05/20/2022 5. Cervicalgia/Cervical Radiculopathy/ Thoracic Radiculopathy/ Lumbar Radiculopathy:Continue Gabapentin.05/20/2022 6. Right Greater Trochanter Bursitis: Continue Ice/Heat and Current medication regimen. 05/20/2022 7. Chronic Pain Syndrome/ Myofascial Pain: Continue Flector Patch/ Alternates with Voltaren Gel. 05/20/2022 8. Sacroiliac Pain: Continue HEP as Tolerated. Continue to Monitor.05/20/2022 9. Chronic Right Ankle Pain: .Continue HEP as tolerated. Continue to Monitor. 05/20/2022. 10. Post-Op Pain:  No complaints Today.  S/P LAPAROSCOPIC BILATERAL INGUINAL HERNIA REPAIR AND POSSIBLE APPENDECTOMY Dr Michaell Cowing Following.   11. Polyarthralgia: Continue HEP as Tolerated. Continue to monitor. 03/25/2022   F/U in 1 month

## 2022-06-24 ENCOUNTER — Encounter
Payer: No Typology Code available for payment source | Attending: Physical Medicine & Rehabilitation | Admitting: Registered Nurse

## 2022-06-24 ENCOUNTER — Encounter: Payer: Self-pay | Admitting: Registered Nurse

## 2022-06-24 VITALS — BP 116/77 | HR 89 | Ht 75.0 in | Wt 234.0 lb

## 2022-06-24 DIAGNOSIS — M7061 Trochanteric bursitis, right hip: Secondary | ICD-10-CM

## 2022-06-24 DIAGNOSIS — M5412 Radiculopathy, cervical region: Secondary | ICD-10-CM | POA: Diagnosis present

## 2022-06-24 DIAGNOSIS — M542 Cervicalgia: Secondary | ICD-10-CM | POA: Diagnosis present

## 2022-06-24 DIAGNOSIS — Z5181 Encounter for therapeutic drug level monitoring: Secondary | ICD-10-CM | POA: Diagnosis present

## 2022-06-24 DIAGNOSIS — Z79891 Long term (current) use of opiate analgesic: Secondary | ICD-10-CM | POA: Diagnosis present

## 2022-06-24 DIAGNOSIS — M961 Postlaminectomy syndrome, not elsewhere classified: Secondary | ICD-10-CM

## 2022-06-24 DIAGNOSIS — M546 Pain in thoracic spine: Secondary | ICD-10-CM

## 2022-06-24 DIAGNOSIS — M5416 Radiculopathy, lumbar region: Secondary | ICD-10-CM | POA: Diagnosis present

## 2022-06-24 DIAGNOSIS — G8929 Other chronic pain: Secondary | ICD-10-CM

## 2022-06-24 DIAGNOSIS — G894 Chronic pain syndrome: Secondary | ICD-10-CM

## 2022-06-24 DIAGNOSIS — M47817 Spondylosis without myelopathy or radiculopathy, lumbosacral region: Secondary | ICD-10-CM | POA: Diagnosis present

## 2022-06-24 DIAGNOSIS — M25571 Pain in right ankle and joints of right foot: Secondary | ICD-10-CM

## 2022-06-24 MED ORDER — OXYCODONE-ACETAMINOPHEN 5-325 MG PO TABS
1.0000 | ORAL_TABLET | Freq: Two times a day (BID) | ORAL | 0 refills | Status: DC
Start: 2022-06-24 — End: 2022-07-22

## 2022-06-24 MED ORDER — XTAMPZA ER 18 MG PO C12A: 1.0000 | EXTENDED_RELEASE_CAPSULE | Freq: Two times a day (BID) | ORAL | 0 refills | Status: DC

## 2022-06-24 NOTE — Progress Notes (Unsigned)
Subjective:    Patient ID: David [name], male    DOB: 1976/10/18, 46 y.o.   MRN: [id number]  HPI: David [name] is a 46 y.o. male who returns for follow up appointment for chronic pain and medication refill. states *** pain is located in  ***. rates pain ***. current exercise regime is walking and performing stretching exercises.  [name] Morphine equivalent is *** MME.   UDS ordered today.   Pain Inventory Average Pain 5 Pain Right Now 5 My pain is intermittent, constant, sharp, burning, dull, stabbing, tingling, and aching  In the last 24 hours, has pain interfered with the following? General activity 6 Relation with others 5 Enjoyment of life 5 What TIME of day is your pain at its worst? morning , daytime, evening, and night Sleep (in general) Fair  Pain is worse with: walking, bending, sitting, standing, and some activites Pain improves with: rest, heat/ice, therapy/exercise, pacing activities, medication, and TENS Relief from Meds: 5  Family History  Problem Relation Age of Onset   Arthritis Mother    Heart disease Mother    Diabetes Mother    Heart disease Father    Diabetes Father    Social History   Socioeconomic History   Marital status: Single    Spouse name: Not on file   Number of children: Not on file   Years of education: Not on file   Highest education level: Not on file  Occupational History   Not on file  Tobacco Use   Smoking status: Never   Smokeless tobacco: Never  Vaping Use   Vaping Use: Never used  Substance and Sexual Activity   Alcohol use: No   Drug use: Never   Sexual activity: Not on file  Other Topics Concern   Not on file  Social History Narrative   Not on file   Social Determinants of Health   Financial Resource Strain: Not on file  Food Insecurity: Not on file  Transportation Needs: Not on file  Physical Activity: Not on file  Stress: Not on file  Social Connections: Not on file   Past Surgical History:   Procedure Laterality Date   ANTERIOR CERVICAL DECOMP/DISCECTOMY FUSION  02-15-2008  @[facility]    C4--5,  C6--7, C7--T1   COLONOSCOPY     INGUINAL HERNIA REPAIR Bilateral 12/13/2020   Procedure: LAPAROSCOPIC BILATERAL INGUINAL HERNIA REPAIR AND POSSIBLE APPENDECTOMY;  Surgeon: 02/10/2021, MD;  Location: [facility];  Service: General;  Laterality: Bilateral;  ERAS   Past Surgical History:  Procedure Laterality Date   ANTERIOR CERVICAL DECOMP/DISCECTOMY FUSION  02-15-2008  @[facility]    C4--5,  C6--7, C7--T1   COLONOSCOPY     INGUINAL HERNIA REPAIR Bilateral 12/13/2020   Procedure: LAPAROSCOPIC BILATERAL INGUINAL HERNIA REPAIR AND POSSIBLE APPENDECTOMY;  Surgeon: , MD;  Location: [facility];  Service: General;  Laterality: Bilateral;  ERAS   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    Family history of adverse reaction to anesthesia    father--- ponv   GERD (gastroesophageal reflux disease)    occasional, watches diet   Greater trochanteric bursitis of right hip    History of kidney stones    Hypertension    Inguinal hernia, bilateral  Lumbar spondylosis    Nocturia    Peripheral neuropathy    bilateral lower legs and a little of left arm   Postlaminectomy syndrome, cervical region    Primary localized osteoarthrosis, lower leg    Radiculopathy, multiple sites in spine    cervical , thoracic, lumbar   Type 2 diabetes mellitus (HCC)    followed by pcp---  (12-11-2020 checks blood sugar 3 times wkly,  fasting sugar--- 125 )   Wears glasses    BP 116/77   Pulse 89   Ht 6\' 3"  (1.905 m)   Wt 234 lb (106.1 kg)   SpO2 98%   BMI 29.25 kg/m   Opioid Risk Score:   Fall Risk Score:  `1  Depression screen PHQ 2/9     05/20/2022    3:10 PM  12/24/2021    3:09 PM 11/19/2021    3:14 PM 10/22/2021    3:31 PM 09/03/2021    3:23 PM 07/30/2021    3:15 PM 06/04/2021    3:05 PM  Depression screen PHQ 2/9  Decreased Interest 1 0 0 0 1 1 1   Down, Depressed, Hopeless 1 0 0 0 1 1 1   PHQ - 2 Score 2 0 0 0 2 2 2       Review of Systems  Musculoskeletal:  Positive for back pain.       Bilateral leg, arm pain, and neck pain   All other systems reviewed and are negative.     Objective:   Physical Exam        Assessment & Plan:  1. Cervical postlaminectomy syndrome, Hx of ACDF C4-5, C6-7, C7-T1, 02/2008. Continue current medication and alternate using heat and ice therapy. 05/20/2022 2. Spondylosis of L-spine . Continue current treatment regime. 05/20/2022 3. Chronic Pain Syndrome Refilled: Xtampza 18 mg one capsule every 12 hours for pain #60 and Oxycodone 5/325mg  one tablet twice a day, may take a extra tablet when pain is sever # 75. Second script e-scribed to accommodate scheduled appointment.  Continue Voltaren Gel/ Flector Patch. 01//18/23. We will continue the opioid monitoring program, this consists of regular clinic visits, examinations, urine drug screen, pill counts as well as use of 03/2008 Controlled Substance Reporting system. A 12 month History has been reviewed on the 05/22/2022 Controlled Substance Reporting System on 05/20/2022. 4. Diabetes with neuropathy. Continue current medication regimen with  Gabapentin. Keep blood sugars under tight control. Continue to monitor. 05/20/2022 5. Cervicalgia/Cervical Radiculopathy/ Thoracic Radiculopathy/ Lumbar Radiculopathy:Continue Gabapentin.05/20/2022 6. Right Greater Trochanter Bursitis: Continue Ice/Heat and Current medication regimen. 05/20/2022 7. Chronic Pain Syndrome/ Myofascial Pain: Continue Flector Patch/ Alternates with Voltaren Gel. 05/20/2022 8. Sacroiliac Pain: Continue HEP as Tolerated. Continue to Monitor.05/20/2022 9. Chronic Right Ankle Pain: .Continue HEP  as tolerated. Continue to Monitor. 05/20/2022. 10. Post-Op Pain:  No complaints Today.  S/P LAPAROSCOPIC BILATERAL INGUINAL HERNIA REPAIR AND POSSIBLE APPENDECTOMY Dr 05/22/2022 Following.   11. Polyarthralgia: Continue HEP as Tolerated. Continue to monitor. 03/25/2022   F/U in 1 month

## 2022-06-25 ENCOUNTER — Encounter: Payer: Self-pay | Admitting: Registered Nurse

## 2022-06-30 LAB — TOXASSURE SELECT,+ANTIDEPR,UR

## 2022-07-03 ENCOUNTER — Telehealth: Payer: Self-pay | Admitting: *Deleted

## 2022-07-03 NOTE — Telephone Encounter (Signed)
Urine drug screen for this encounter is consistent for prescribed medication 

## 2022-07-22 ENCOUNTER — Encounter
Payer: No Typology Code available for payment source | Attending: Physical Medicine & Rehabilitation | Admitting: Registered Nurse

## 2022-07-22 ENCOUNTER — Encounter: Payer: Self-pay | Admitting: Registered Nurse

## 2022-07-22 VITALS — BP 128/76 | HR 77 | Ht 75.0 in | Wt 233.6 lb

## 2022-07-22 DIAGNOSIS — M7061 Trochanteric bursitis, right hip: Secondary | ICD-10-CM | POA: Diagnosis present

## 2022-07-22 DIAGNOSIS — G894 Chronic pain syndrome: Secondary | ICD-10-CM | POA: Insufficient documentation

## 2022-07-22 DIAGNOSIS — M542 Cervicalgia: Secondary | ICD-10-CM | POA: Diagnosis present

## 2022-07-22 DIAGNOSIS — M961 Postlaminectomy syndrome, not elsewhere classified: Secondary | ICD-10-CM | POA: Diagnosis present

## 2022-07-22 DIAGNOSIS — M47817 Spondylosis without myelopathy or radiculopathy, lumbosacral region: Secondary | ICD-10-CM | POA: Insufficient documentation

## 2022-07-22 DIAGNOSIS — M5416 Radiculopathy, lumbar region: Secondary | ICD-10-CM | POA: Diagnosis present

## 2022-07-22 DIAGNOSIS — G8929 Other chronic pain: Secondary | ICD-10-CM | POA: Insufficient documentation

## 2022-07-22 DIAGNOSIS — M5412 Radiculopathy, cervical region: Secondary | ICD-10-CM | POA: Diagnosis present

## 2022-07-22 DIAGNOSIS — Z5181 Encounter for therapeutic drug level monitoring: Secondary | ICD-10-CM | POA: Insufficient documentation

## 2022-07-22 DIAGNOSIS — Z79891 Long term (current) use of opiate analgesic: Secondary | ICD-10-CM | POA: Insufficient documentation

## 2022-07-22 DIAGNOSIS — M25571 Pain in right ankle and joints of right foot: Secondary | ICD-10-CM | POA: Diagnosis present

## 2022-07-22 DIAGNOSIS — M546 Pain in thoracic spine: Secondary | ICD-10-CM | POA: Diagnosis present

## 2022-07-22 MED ORDER — XTAMPZA ER 18 MG PO C12A: 1.0000 | EXTENDED_RELEASE_CAPSULE | Freq: Two times a day (BID) | ORAL | 0 refills | Status: DC

## 2022-07-22 MED ORDER — OXYCODONE-ACETAMINOPHEN 5-325 MG PO TABS: 1.0000 | ORAL_TABLET | Freq: Two times a day (BID) | ORAL | 0 refills | Status: DC

## 2022-07-22 NOTE — Progress Notes (Signed)
Subjective:    Patient ID: David [name], male    DOB: 1976-10-10, 46 y.o.   MRN: [id number]  [id number] David [name] is a 46 y.o. male who returns for follow up appointment for chronic pain and medication refill. He states his pain is located in his neck radiating into her bilateral shoulders L>R, and radiating into her left forearm into his pinky, mid- lower back pain radiating into his bilateral lower extremities L>R. He also reports right hip pain and right ankle pain. He rates his pain 6. His current exercise regime is walking and performing stretching exercises.  David [name] Morphine equivalent is 78.75 MME.  He  is also prescribed Diazepam  by [facility]  .We have discussed the black box warning of using opioids and benzodiazepines. I highlighted the dangers of using these drugs together and discussed the adverse events including respiratory suppression, overdose, cognitive impairment and importance of compliance with current regimen. We will continue to monitor and adjust as indicated.    Last UDS was on 06/24/2022, it was consistent.    Pain Inventory Average Pain 5 Pain Right Now 6 My pain is intermittent, constant, sharp, burning, dull, stabbing, tingling, and aching  In the last 24 hours, has pain interfered with the following? General activity 6 Relation with others 5 Enjoyment of life 5 What TIME of day is your pain at its worst? morning , daytime, evening, and night Sleep (in general) Fair  Pain is worse with: walking, bending, sitting, inactivity, standing, and some activites Pain improves with: rest, heat/ice, therapy/exercise, pacing activities, medication, and TENS Relief from Meds: 5  Family History  Problem Relation Age of Onset   Arthritis Mother    Heart disease Mother    Diabetes Mother    Heart disease Father    Diabetes Father    Social History   Socioeconomic History   Marital status: Single    Spouse name: Not on file   Number of children: Not on  file   Years of education: Not on file   Highest education level: Not on file  Occupational History   Not on file  Tobacco Use   Smoking status: Never   Smokeless tobacco: Never  Vaping Use   Vaping Use: Never used  Substance and Sexual Activity   Alcohol use: No   Drug use: Never   Sexual activity: Not on file  Other Topics Concern   Not on file  Social History Narrative   Not on file   Social Determinants of Health   Financial Resource Strain: Not on file  Food Insecurity: Not on file  Transportation Needs: Not on file  Physical Activity: Not on file  Stress: Not on file  Social Connections: Not on file   Past Surgical History:  Procedure Laterality Date   ANTERIOR CERVICAL DECOMP/DISCECTOMY FUSION  02-15-2008  @[facility]    C4--5,  C6--7, C7--T1   COLONOSCOPY     INGUINAL HERNIA REPAIR Bilateral 12/13/2020   Procedure: LAPAROSCOPIC BILATERAL INGUINAL HERNIA REPAIR AND POSSIBLE APPENDECTOMY;  Surgeon: 02/10/2021, MD;  Location: [facility] [place];  Service: General;  Laterality: Bilateral;  ERAS   Past Surgical History:  Procedure Laterality Date   ANTERIOR CERVICAL DECOMP/DISCECTOMY FUSION  02-15-2008  @[facility]    C4--5,  C6--7, C7--T1   COLONOSCOPY     INGUINAL HERNIA REPAIR Bilateral 12/13/2020   Procedure: LAPAROSCOPIC BILATERAL INGUINAL HERNIA REPAIR AND POSSIBLE APPENDECTOMY;  Surgeon: , MD;  Location: [facility];  Service:  General;  Laterality: Bilateral;  ERAS   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    Family history of adverse reaction to anesthesia    father--- ponv   GERD (gastroesophageal reflux disease)    occasional, watches diet   Greater trochanteric bursitis of right hip    History of kidney stones    Hypertension     Inguinal hernia, bilateral    Lumbar spondylosis    Nocturia    Peripheral neuropathy    bilateral lower legs and a little of left arm   Postlaminectomy syndrome, cervical region    Primary localized osteoarthrosis, lower leg    Radiculopathy, multiple sites in spine    cervical , thoracic, lumbar   Type 2 diabetes mellitus ([place])    followed by pcp---  (12-11-2020 checks blood sugar 3 times wkly,  fasting sugar--- 125 )   Wears glasses    BP 128/76   Pulse 77   Ht 6\' 3"  (1.905 m)   Wt 233 lb 9.6 oz (106 kg)   SpO2 98%   BMI 29.20 kg/m   Opioid Risk Score:   Fall Risk Score:  `1  Depression screen PHQ 2/9     05/20/2022    3:10 PM 12/24/2021    3:09 PM 11/19/2021    3:14 PM 10/22/2021    3:31 PM 09/03/2021    3:23 PM 07/30/2021    3:15 PM 06/04/2021    3:05 PM  Depression screen PHQ 2/9  Decreased Interest 1 0 0 0 1 1 1   Down, Depressed, Hopeless 1 0 0 0 1 1 1   PHQ - 2 Score 2 0 0 0 2 2 2     Review of Systems  Musculoskeletal:  Positive for back pain and myalgias.  All other systems reviewed and are negative.     Objective:   Physical Exam Vitals and nursing note reviewed.  Constitutional:      Appearance: Normal appearance.  Neck:     Comments: Cervical Paraspinal Tenderness: C-5-C-6 Cardiovascular:     Rate and Rhythm: Normal rate and regular rhythm.     Pulses: Normal pulses.     Heart sounds: Normal heart sounds.  Pulmonary:     Effort: Pulmonary effort is normal.     Breath sounds: Normal breath sounds.  Musculoskeletal:     Cervical back: Normal range of motion and neck supple.     Comments: Normal Muscle Bulk and Muscle Testing Reveals:  Upper Extremities: Full ROM and Muscle Strength 5/5 Bilateral AC Joint Tenderness Thoracic and Lumbar Hypersensitivity Right Greater Trochanter Tenderness  Lower Extremities: Full ROM and Muscle Strength 5/5 Narrow Based Gait     Skin:    General: Skin is warm and dry.  Neurological:     Mental Status: He is  alert and oriented to person, place, and time.  Psychiatric:        Mood and Affect: Mood normal.        Behavior: Behavior normal.         Assessment & Plan:  1. Cervical postlaminectomy syndrome, Hx of ACDF C4-5, C6-7, C7-T1, 02/2008. Continue current medication and alternate using heat and ice therapy. 07/22/2022 2. Spondylosis of L-spine . Continue current treatment regime. 07/22/2022 3. Chronic Pain Syndrome Refilled:  Xtampza 18 mg one capsule every 12 hours for pain #60 and Oxycodone 5/325mg  one tablet twice a day, may take a extra tablet when pain is sever # 75. Second script e-scribed to accommodate scheduled appointment.  Continue Voltaren Gel/ Flector Patch. 08//23/23. We will continue the opioid monitoring program, this consists of regular clinic visits, examinations, urine drug screen, pill counts as well as use of [place] Controlled Substance Reporting system. A 12 month History has been reviewed on the [place] Controlled Substance Reporting System on 07/22/2022. 4. Diabetes with neuropathy. Continue current medication regimen with  Gabapentin. Keep blood sugars under tight control. Continue to monitor. 07/22/2022 5. Cervicalgia/Cervical Radiculopathy/ Thoracic Radiculopathy/ Lumbar Radiculopathy:Continue Gabapentin.07/22/2022 6. Right Greater Trochanter Bursitis: Continue Ice/Heat and Current medication regimen. 07/22/2022 7. Chronic Pain Syndrome/ Myofascial Pain: Continue Flector Patch/ Alternates with Voltaren Gel. 07/22/2022 8. Sacroiliac Pain: Continue HEP as Tolerated. Continue to Monitor.07/22/2022 9. Chronic Right Ankle Pain: .Continue HEP as tolerated. Continue to Monitor. 07/22/2022. 10. Post-Op Pain:  No complaints Today.  S/P LAPAROSCOPIC BILATERAL INGUINAL HERNIA REPAIR AND POSSIBLE APPENDECTOMY Dr Michaell Cowing Following.   11. Polyarthralgia: Continue HEP as Tolerated. Continue to monitor. 07/22/2022   F/U in 1 month

## 2022-08-26 ENCOUNTER — Encounter
Payer: No Typology Code available for payment source | Attending: Physical Medicine & Rehabilitation | Admitting: Registered Nurse

## 2022-08-26 ENCOUNTER — Other Ambulatory Visit: Payer: Self-pay | Admitting: Registered Nurse

## 2022-08-26 VITALS — BP 114/77 | HR 83 | Ht 75.0 in | Wt 233.0 lb

## 2022-08-26 DIAGNOSIS — M47817 Spondylosis without myelopathy or radiculopathy, lumbosacral region: Secondary | ICD-10-CM | POA: Insufficient documentation

## 2022-08-26 DIAGNOSIS — M25551 Pain in right hip: Secondary | ICD-10-CM | POA: Diagnosis present

## 2022-08-26 DIAGNOSIS — G8929 Other chronic pain: Secondary | ICD-10-CM | POA: Insufficient documentation

## 2022-08-26 DIAGNOSIS — G894 Chronic pain syndrome: Secondary | ICD-10-CM | POA: Diagnosis present

## 2022-08-26 DIAGNOSIS — M7061 Trochanteric bursitis, right hip: Secondary | ICD-10-CM | POA: Diagnosis present

## 2022-08-26 DIAGNOSIS — Z5181 Encounter for therapeutic drug level monitoring: Secondary | ICD-10-CM | POA: Diagnosis present

## 2022-08-26 DIAGNOSIS — M25571 Pain in right ankle and joints of right foot: Secondary | ICD-10-CM | POA: Diagnosis present

## 2022-08-26 DIAGNOSIS — M5412 Radiculopathy, cervical region: Secondary | ICD-10-CM | POA: Diagnosis present

## 2022-08-26 DIAGNOSIS — M961 Postlaminectomy syndrome, not elsewhere classified: Secondary | ICD-10-CM | POA: Diagnosis present

## 2022-08-26 DIAGNOSIS — Z79891 Long term (current) use of opiate analgesic: Secondary | ICD-10-CM | POA: Diagnosis present

## 2022-08-26 DIAGNOSIS — M546 Pain in thoracic spine: Secondary | ICD-10-CM | POA: Insufficient documentation

## 2022-08-26 DIAGNOSIS — M542 Cervicalgia: Secondary | ICD-10-CM | POA: Diagnosis present

## 2022-08-26 DIAGNOSIS — M5416 Radiculopathy, lumbar region: Secondary | ICD-10-CM | POA: Diagnosis present

## 2022-08-26 MED ORDER — OXYCODONE-ACETAMINOPHEN 5-325 MG PO TABS: 1.0000 | ORAL_TABLET | Freq: Two times a day (BID) | ORAL | 0 refills | Status: DC

## 2022-08-26 MED ORDER — XTAMPZA ER 18 MG PO C12A: 1.0000 | EXTENDED_RELEASE_CAPSULE | Freq: Two times a day (BID) | ORAL | 0 refills | Status: DC

## 2022-08-26 NOTE — Progress Notes (Signed)
Subjective:    Patient ID: David [name], male    DOB: February 03, 1976, 46 y.o.   MRN: [id number]  HPI: David [name] is a 46 y.o. male who returns for follow up appointment for chronic pain and medication refill. He states his pain is located in his neck radiating into his bilateral shoulders, mid- lower back radiating into his bilateral hips R>L. Also reports right ankle pain. He rates his pain 5. His current exercise regime is walking and performing stretching exercises.  David [name] Morphine equivalent is 70.88 MME. He is also prescribed Diazepam by Mauricio Po NP .We have discussed the black box warning of using opioids and benzodiazepines. I highlighted the dangers of using these drugs together and discussed the adverse events including respiratory suppression, overdose, cognitive impairment and importance of compliance with current regimen. We will continue to monitor and adjust as indicated.     Last UDS was Performed 06/24/2022, it was consistent.     Pain Inventory Average Pain 5 Pain Right Now 5 My pain is intermittent, constant, sharp, burning, dull, stabbing, tingling, and aching  In the last 24 hours, has pain interfered with the following? General activity 5 Relation with others 5 Enjoyment of life 5 What TIME of day is your pain at its worst? morning , daytime, evening, and night Sleep (in general) Fair  Pain is worse with: walking, bending, sitting, standing, and some activites Pain improves with: rest, heat/ice, therapy/exercise, pacing activities, medication, and TENS Relief from Meds: 5  Family History  Problem Relation Age of Onset   Arthritis Mother    Heart disease Mother    Diabetes Mother    Heart disease Father    Diabetes Father    Social History   Socioeconomic History   Marital status: Single    Spouse name: Not on file   Number of children: Not on file   Years of education: Not on file   Highest education level: Not on file  Occupational  History   Not on file  Tobacco Use   Smoking status: Never   Smokeless tobacco: Never  Vaping Use   Vaping Use: Never used  Substance and Sexual Activity   Alcohol use: No   Drug use: Never   Sexual activity: Not on file  Other Topics Concern   Not on file  Social History Narrative   Not on file   Social Determinants of Health   Financial Resource Strain: Not on file  Food Insecurity: Not on file  Transportation Needs: Not on file  Physical Activity: Not on file  Stress: Not on file  Social Connections: Not on file   Past Surgical History:  Procedure Laterality Date   ANTERIOR CERVICAL DECOMP/DISCECTOMY FUSION  02-15-2008  @[facility]    C4--5,  C6--7, C7--T1   COLONOSCOPY     INGUINAL HERNIA REPAIR Bilateral 12/13/2020   Procedure: LAPAROSCOPIC BILATERAL INGUINAL HERNIA REPAIR AND POSSIBLE APPENDECTOMY;  Surgeon: 02/10/2021, MD;  Location: [facility];  Service: General;  Laterality: Bilateral;  ERAS   Past Surgical History:  Procedure Laterality Date   ANTERIOR CERVICAL DECOMP/DISCECTOMY FUSION  02-15-2008  @[facility]    C4--5,  C6--7, C7--T1   COLONOSCOPY     INGUINAL HERNIA REPAIR Bilateral 12/13/2020   Procedure: LAPAROSCOPIC BILATERAL INGUINAL HERNIA REPAIR AND POSSIBLE APPENDECTOMY;  Surgeon: , MD;  Location: [facility];  Service: General;  Laterality: Bilateral;  ERAS   Past Medical History:  Diagnosis Date   Cervicalgia  Chronic pain syndrome    pain clinic   Complication of anesthesia    post op urinary retention after ACDF surgery 04/ 2009   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    Family history of adverse reaction to anesthesia    father--- ponv   GERD (gastroesophageal reflux disease)    occasional, watches diet   Greater trochanteric bursitis of right hip    History of kidney stones    Hypertension    Inguinal hernia, bilateral    Lumbar spondylosis    Nocturia    Peripheral neuropathy     bilateral lower legs and a little of left arm   Postlaminectomy syndrome, cervical region    Primary localized osteoarthrosis, lower leg    Radiculopathy, multiple sites in spine    cervical , thoracic, lumbar   Type 2 diabetes mellitus ([facility])    followed by pcp---  (12-11-2020 checks blood sugar 3 times wkly,  fasting sugar--- 125 )   Wears glasses    BP 114/77   Pulse 83   Ht 6\' 3"  (1.905 m)   Wt 233 lb (105.7 kg)   SpO2 99%   BMI 29.12 kg/m   Opioid Risk Score:   Fall Risk Score:  `1  Depression screen PHQ 2/9     05/20/2022    3:10 PM 12/24/2021    3:09 PM 11/19/2021    3:14 PM 10/22/2021    3:31 PM 09/03/2021    3:23 PM 07/30/2021    3:15 PM 06/04/2021    3:05 PM  Depression screen PHQ 2/9  Decreased Interest 1 0 0 0 1 1 1   Down, Depressed, Hopeless 1 0 0 0 1 1 1   PHQ - 2 Score 2 0 0 0 2 2 2      Review of Systems  Musculoskeletal:  Positive for back pain.       Left arm pain Bilateral leg pain  All other systems reviewed and are negative.     Objective:   Physical Exam Vitals and nursing note reviewed.  Constitutional:      Appearance: Normal appearance.  Cardiovascular:     Rate and Rhythm: Normal rate and regular rhythm.     Pulses: Normal pulses.     Heart sounds: Normal heart sounds.  Pulmonary:     Effort: Pulmonary effort is normal.     Breath sounds: Normal breath sounds.  Musculoskeletal:     Cervical back: Normal range of motion and neck supple.     Comments: Normal Muscle Bulk and Muscle Testing Reveals:  Upper Extremities: Full ROM and Muscle Strength 5/5 Bilateral AC Joint Tenderness Thoracic, and Lumbar Hypersensitivity Right Greater Trochanter Tenderness Lower Extremities: Full ROM and Muscle Strength 5/5  Arises from Chair with ease Narrow Based Gait     Skin:    General: Skin is warm and dry.  Neurological:     Mental Status: He is alert and oriented to person, place, and time.  Psychiatric:        Mood and Affect: Mood normal.         Behavior: Behavior normal.         Assessment & Plan:  1. Cervical postlaminectomy syndrome, Hx of ACDF C4-5, C6-7, C7-T1, 02/2008. Continue current medication and alternate using heat and ice therapy. 08/26/2022 2. Spondylosis of L-spine . Continue current treatment regime. 08/26/2022 3. Chronic Pain Syndrome Refilled: Xtampza 18 mg one capsule every 12 hours for pain #60 and Oxycodone 5/325mg  one tablet  twice a day, may take a extra tablet when pain is sever # 75. Second script e-scribed to accommodate scheduled appointment.  Continue Voltaren Gel/ Flector Patch. 08//23/23. We will continue the opioid monitoring program, this consists of regular clinic visits, examinations, urine drug screen, pill counts as well as use of [place] Controlled Substance Reporting system. A 12 month History has been reviewed on the [place] on 08/26/2022. 4. Diabetes with neuropathy. Continue current medication regimen with  Gabapentin. Keep blood sugars under tight control. Continue to monitor. 08/26/2022 5. Cervicalgia/Cervical Radiculopathy/ Thoracic Radiculopathy/ Lumbar Radiculopathy:Continue Gabapentin.08/26/2022 6. Right Greater Trochanter Bursitis: Continue Ice/Heat and Current medication regimen. 08/26/2022 7. Chronic Pain Syndrome/ Myofascial Pain: Continue Flector Patch/ Alternates with Voltaren Gel. 08/26/2022 8. Sacroiliac Pain: Continue HEP as Tolerated. Continue to Monitor.08/26/2022 9. Chronic Right Ankle Pain: .Continue HEP as tolerated. Continue to Monitor. 08/26/2022. 10. Post-Op Pain:  No complaints Today.  S/P LAPAROSCOPIC BILATERAL INGUINAL HERNIA REPAIR AND POSSIBLE APPENDECTOMY Dr Johney Maine Following.   11. Polyarthralgia: Continue HEP as Tolerated. Continue to monitor. 08/26/2022   F/U in 1 month

## 2022-08-28 ENCOUNTER — Encounter: Payer: Self-pay | Admitting: Registered Nurse

## 2022-09-17 ENCOUNTER — Telehealth: Payer: Self-pay | Admitting: Registered Nurse

## 2022-09-17 MED ORDER — OXYCODONE-ACETAMINOPHEN 5-325 MG PO TABS: 1.0000 | ORAL_TABLET | Freq: Two times a day (BID) | ORAL | 0 refills | Status: DC

## 2022-09-17 MED ORDER — XTAMPZA ER 18 MG PO C12A: 1.0000 | EXTENDED_RELEASE_CAPSULE | Freq: Two times a day (BID) | ORAL | 0 refills | Status: DC

## 2022-09-17 NOTE — Telephone Encounter (Signed)
PMP was Reviewed.  Xtampza and Oxycodone e-scribed to pharmacy. Mr. Iyengar is aware of the above and verbalizes understanding.

## 2022-09-21 ENCOUNTER — Encounter: Payer: No Typology Code available for payment source | Admitting: Registered Nurse

## 2022-10-16 ENCOUNTER — Ambulatory Visit
Admission: RE | Admit: 2022-10-16 | Discharge: 2022-10-16 | Disposition: A | Discharge status: Home / Self Care | Payer: No Typology Code available for payment source | Source: Ambulatory Visit | Attending: Registered Nurse | Admitting: Registered Nurse

## 2022-10-21 ENCOUNTER — Encounter
Payer: No Typology Code available for payment source | Attending: Physical Medicine & Rehabilitation | Admitting: Registered Nurse

## 2022-10-21 ENCOUNTER — Encounter: Payer: Self-pay | Admitting: Registered Nurse

## 2022-10-21 VITALS — BP 113/76 | HR 82 | Ht 75.0 in | Wt 231.0 lb

## 2022-10-21 DIAGNOSIS — M961 Postlaminectomy syndrome, not elsewhere classified: Secondary | ICD-10-CM | POA: Insufficient documentation

## 2022-10-21 DIAGNOSIS — M542 Cervicalgia: Secondary | ICD-10-CM

## 2022-10-21 DIAGNOSIS — M546 Pain in thoracic spine: Secondary | ICD-10-CM | POA: Diagnosis present

## 2022-10-21 DIAGNOSIS — Z5181 Encounter for therapeutic drug level monitoring: Secondary | ICD-10-CM | POA: Diagnosis present

## 2022-10-21 DIAGNOSIS — M7061 Trochanteric bursitis, right hip: Secondary | ICD-10-CM | POA: Insufficient documentation

## 2022-10-21 DIAGNOSIS — M5412 Radiculopathy, cervical region: Secondary | ICD-10-CM | POA: Insufficient documentation

## 2022-10-21 DIAGNOSIS — G8929 Other chronic pain: Secondary | ICD-10-CM | POA: Diagnosis present

## 2022-10-21 DIAGNOSIS — M47817 Spondylosis without myelopathy or radiculopathy, lumbosacral region: Secondary | ICD-10-CM | POA: Diagnosis present

## 2022-10-21 DIAGNOSIS — M25571 Pain in right ankle and joints of right foot: Secondary | ICD-10-CM

## 2022-10-21 DIAGNOSIS — G894 Chronic pain syndrome: Secondary | ICD-10-CM | POA: Insufficient documentation

## 2022-10-21 DIAGNOSIS — M5416 Radiculopathy, lumbar region: Secondary | ICD-10-CM | POA: Diagnosis present

## 2022-10-21 DIAGNOSIS — Z79891 Long term (current) use of opiate analgesic: Secondary | ICD-10-CM

## 2022-10-21 MED ORDER — XTAMPZA ER 18 MG PO C12A: 1.0000 | EXTENDED_RELEASE_CAPSULE | Freq: Two times a day (BID) | ORAL | 0 refills | Status: DC

## 2022-10-21 MED ORDER — OXYCODONE-ACETAMINOPHEN 5-325 MG PO TABS: 1.0000 | ORAL_TABLET | Freq: Two times a day (BID) | ORAL | 0 refills | Status: DC

## 2022-10-21 NOTE — Progress Notes (Signed)
Subjective:    Patient ID: David [name], male    DOB: 10-Nov-1975, 46 y.o.   MRN: [id number]  HPI: David [name] is a 46 y.o. male who returns for follow up appointment for chronic pain and medication refill. He states his pain is located in his neck radiating into his right shoulder, also reports left shoulder, mid- lower back radiating into his bilateral lower extremities L>R. Also reports right hip pain and right ankle pain. He rates his pain 5. His current exercise regime is walking and performing stretching exercises.  [name] Morphine equivalent is 78.75 MME. He is also prescribed Diazepam by [facility]p. .We have discussed the black box warning of using opioids and benzodiazepines. I highlighted the dangers of using these drugs together and discussed the adverse events including respiratory suppression, overdose, cognitive impairment and importance of compliance with current regimen. We will continue to monitor and adjust as indicated.     Last UDS was Performed on 06/24/2022, it was consistent.   In the past Mr. Encarnacion was prescribed Opana and Xtampza, and his pain has been  controlled.with his current medication regimen.     Pain Inventory Average Pain 5 Pain Right Now 5 My pain is intermittent, constant, sharp, burning, dull, stabbing, tingling, and aching  In the last 24 hours, has pain interfered with the following? General activity 5 Relation with others 4 Enjoyment of life 5 What TIME of day is your pain at its worst? morning , daytime, evening, and night Sleep (in general) Fair  Pain is worse with: walking, bending, sitting, inactivity, standing, and some activites Pain improves with: rest, heat/ice, therapy/exercise, pacing activities, medication, and TENS Relief from Meds: 5  Family History  Problem Relation Age of Onset   Arthritis Mother    Heart disease Mother    Diabetes Mother    Heart disease Father    Diabetes Father    Social History    Socioeconomic History   Marital status: Single    Spouse name: Not on file   Number of children: Not on file   Years of education: Not on file   Highest education level: Not on file  Occupational History   Not on file  Tobacco Use   Smoking status: Never   Smokeless tobacco: Never  Vaping Use   Vaping Use: Never used  Substance and Sexual Activity   Alcohol use: No   Drug use: Never   Sexual activity: Not on file  Other Topics Concern   Not on file  Social History Narrative   Not on file   Social Determinants of Health   Financial Resource Strain: Not on file  Food Insecurity: Not on file  Transportation Needs: Not on file  Physical Activity: Not on file  Stress: Not on file  Social Connections: Not on file   Past Surgical History:  Procedure Laterality Date   ANTERIOR CERVICAL DECOMP/DISCECTOMY FUSION  02-15-2008  @[facility]    C4--5,  C6--7, C7--T1   COLONOSCOPY     INGUINAL HERNIA REPAIR Bilateral 12/13/2020   Procedure: LAPAROSCOPIC BILATERAL INGUINAL HERNIA REPAIR AND POSSIBLE APPENDECTOMY;  Surgeon: 02/10/2021, MD;  Location: [facility] ;  Service: General;  Laterality: Bilateral;  ERAS   Past Surgical History:  Procedure Laterality Date   ANTERIOR CERVICAL DECOMP/DISCECTOMY FUSION  02-15-2008  @[facility]    C4--5,  C6--7, C7--T1   COLONOSCOPY     INGUINAL HERNIA REPAIR Bilateral 12/13/2020   Procedure: LAPAROSCOPIC BILATERAL INGUINAL HERNIA REPAIR  AND POSSIBLE APPENDECTOMY;  Surgeon: [name], MD;  Location: [facility];  Service: General;  Laterality: Bilateral;  ERAS   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    Family history of adverse reaction to anesthesia    father--- ponv   GERD (gastroesophageal reflux disease)     occasional, watches diet   Greater trochanteric bursitis of right hip    History of kidney stones    Hypertension    Inguinal hernia, bilateral    Lumbar spondylosis    Nocturia    Peripheral neuropathy    bilateral lower legs and a little of left arm   Postlaminectomy syndrome, cervical region    Primary localized osteoarthrosis, lower leg    Radiculopathy, multiple sites in spine    cervical , thoracic, lumbar   Type 2 diabetes mellitus ([facility])    followed by pcp---  (12-11-2020 checks blood sugar 3 times wkly,  fasting sugar--- 125 )   Wears glasses    There were no vitals taken for this visit.  Opioid Risk Score:   Fall Risk Score:  `1  Depression screen PHQ 2/9     08/26/2022    3:14 PM 05/20/2022    3:10 PM 12/24/2021    3:09 PM 11/19/2021    3:14 PM 10/22/2021    3:31 PM 09/03/2021    3:23 PM 07/30/2021    3:15 PM  Depression screen PHQ 2/9  Decreased Interest 0 1 0 0 0 1 1  Down, Depressed, Hopeless 0 1 0 0 0 1 1  PHQ - 2 Score 0 2 0 0 0 2 2    Review of Systems  Musculoskeletal:  Positive for arthralgias, back pain and neck pain.       Pain in both legs & left arm  All other systems reviewed and are negative.      Objective:   Physical Exam Vitals and nursing note reviewed.  Constitutional:      Appearance: Normal appearance.  Neck:     Comments: Cervical Paraspinal Tenderness: C-5-C-6 Cardiovascular:     Rate and Rhythm: Normal rate and regular rhythm.     Pulses: Normal pulses.     Heart sounds: Normal heart sounds.  Pulmonary:     Effort: Pulmonary effort is normal.     Breath sounds: Normal breath sounds.  Musculoskeletal:     Cervical back: Normal range of motion and neck supple. No rigidity.     Comments: Normal Muscle Bulk and Muscle Testing Reveals:  Upper Extremities: Full ROM and Muscle Strength 5/5 Bilateral AC Joint Tenderness  Thoracic and Lumbar Hypersensitivity Right Greater Trochanter Tenderness Lower Extremities: Full ROM and  Muscle Strength 5/5 Arises from Table with Ease Narrow Based  Gait     Skin:    General: Skin is warm and dry.  Neurological:     Mental Status: He is alert and oriented to person, place, and time.  Psychiatric:        Mood and Affect: Mood normal.        Behavior: Behavior normal.         Assessment & Plan:  1. Cervical postlaminectomy syndrome, Hx of ACDF C4-5, C6-7, C7-T1, 02/2008. Continue current medication and alternate using heat and ice therapy.  10/21/2022 2. Spondylosis of L-spine . Continue current treatment regime. 10/21/2022 3. Chronic Pain Syndrome Refilled: Xtampza 18 mg one capsule every 12 hours for pain #60 and Oxycodone 5/325mg  one tablet twice a day, may take a extra tablet when pain is sever # 75.  Continue Voltaren Gel/ Flector Patch.. We will continue the opioid monitoring program, this consists of regular clinic visits, examinations, urine drug screen, pill counts as well as use of [place] Controlled Substance Reporting system. A 12 month History has been reviewed on the [place] Controlled Substance Reporting System on 10/21/2022. 4. Diabetes with neuropathy. Continue current medication regimen with  Gabapentin. Keep blood sugars under tight control. Continue to monitor. 10/21/2022 5. Cervicalgia/Cervical Radiculopathy/ Thoracic Radiculopathy/ Lumbar Radiculopathy:Continue Gabapentin.10/21/2022 6. Right Greater Trochanter Bursitis: Continue Ice/Heat and Current medication regimen. 10/21/2022 7. Chronic Pain Syndrome/ Myofascial Pain: Continue Flector Patch/ Alternates with Voltaren Gel. 10/21/2022 8. Sacroiliac Pain: Continue HEP as Tolerated. Continue to Monitor.10/21/2022 9. Chronic Right Ankle Pain: .Continue HEP as tolerated. Continue to Monitor. 10/21/2022. 10. Post-Op Pain:  No complaints Today.  S/P LAPAROSCOPIC BILATERAL INGUINAL HERNIA REPAIR AND POSSIBLE APPENDECTOMY Dr Michaell Cowing Following.   11. Polyarthralgia: Continue HEP as Tolerated.  Continue to monitor. 10/21/2022   F/U in 1 month

## 2022-11-17 ENCOUNTER — Telehealth: Payer: Self-pay

## 2022-11-17 NOTE — Telephone Encounter (Addendum)
Prior authorization for Xtampza ER 18 mg has been approved. 11/17/2022 - 11/18/2023.  I have informed the pharmacy. He will need an updated script.

## 2022-11-17 NOTE — Telephone Encounter (Signed)
Mr. Kessen has a scheduled appointment on 11/25/2022. Call placed to Mr. Zawislak, no answer. Left a My- Chart message, awaiting his response.

## 2022-11-25 ENCOUNTER — Encounter
Payer: No Typology Code available for payment source | Attending: Physical Medicine & Rehabilitation | Admitting: Registered Nurse

## 2022-11-25 ENCOUNTER — Encounter: Payer: Self-pay | Admitting: Registered Nurse

## 2022-11-25 VITALS — BP 124/82 | HR 83 | Ht 75.0 in | Wt 227.0 lb

## 2022-11-25 DIAGNOSIS — G894 Chronic pain syndrome: Secondary | ICD-10-CM

## 2022-11-25 DIAGNOSIS — Z5181 Encounter for therapeutic drug level monitoring: Secondary | ICD-10-CM | POA: Diagnosis not present

## 2022-11-25 DIAGNOSIS — M47817 Spondylosis without myelopathy or radiculopathy, lumbosacral region: Secondary | ICD-10-CM

## 2022-11-25 DIAGNOSIS — M7061 Trochanteric bursitis, right hip: Secondary | ICD-10-CM | POA: Diagnosis present

## 2022-11-25 DIAGNOSIS — M542 Cervicalgia: Secondary | ICD-10-CM

## 2022-11-25 DIAGNOSIS — Z79891 Long term (current) use of opiate analgesic: Secondary | ICD-10-CM | POA: Diagnosis present

## 2022-11-25 DIAGNOSIS — M5416 Radiculopathy, lumbar region: Secondary | ICD-10-CM | POA: Diagnosis present

## 2022-11-25 DIAGNOSIS — M961 Postlaminectomy syndrome, not elsewhere classified: Secondary | ICD-10-CM | POA: Diagnosis present

## 2022-11-25 DIAGNOSIS — M25571 Pain in right ankle and joints of right foot: Secondary | ICD-10-CM | POA: Diagnosis present

## 2022-11-25 DIAGNOSIS — M546 Pain in thoracic spine: Secondary | ICD-10-CM | POA: Diagnosis present

## 2022-11-25 DIAGNOSIS — G8929 Other chronic pain: Secondary | ICD-10-CM | POA: Diagnosis present

## 2022-11-25 DIAGNOSIS — M5412 Radiculopathy, cervical region: Secondary | ICD-10-CM

## 2022-11-25 MED ORDER — OXYCODONE-ACETAMINOPHEN 5-325 MG PO TABS: 1.0000 | ORAL_TABLET | Freq: Two times a day (BID) | ORAL | 0 refills | Status: DC

## 2022-11-25 MED ORDER — XTAMPZA ER 18 MG PO C12A: 1.0000 | EXTENDED_RELEASE_CAPSULE | Freq: Two times a day (BID) | ORAL | 0 refills | Status: DC

## 2022-11-25 NOTE — Progress Notes (Signed)
Subjective:    Patient ID: David [name], male    DOB: [birth year], 47 y.o.   MRN: [id number]  HPI: David [name] is a 47 y.o. male who returns for follow up appointment for chronic pain and medication refill. He states his pain is located in his neck radiating into his bilateral shoulders L>R, radiating into left arm, pinky finger, ring finger and thumb with numbness and tingling. Also reports mid- lower back pain radiating into her bilateral lower extremities L> R, Right hip pain and right ankle pain. He rates his pain 5. His current exercise regime is walking and performing stretching exercises.  Mr. Goeller Morphine equivalent is 70.88 MME. He  is also prescribed Diazepam  by [facility] NP .We have discussed the black box warning of using opioids and benzodiazepines. I highlighted the dangers of using these drugs together and discussed the adverse events including respiratory suppression, overdose, cognitive impairment and importance of compliance with current regimen. We will continue to monitor and adjust as indicated.     UDS ordered today.    Pain Inventory Average Pain 5 Pain Right Now 5 My pain is intermittent, constant, sharp, burning, dull, stabbing, tingling, and aching  In the last 24 hours, has pain interfered with the following? General activity 6 Relation with others 5 Enjoyment of life 5 What TIME of day is your pain at its worst? morning , daytime, evening, and night Sleep (in general) Fair  Pain is worse with: walking, bending, sitting, inactivity, standing, and some activites Pain improves with: rest, heat/ice, therapy/exercise, pacing activities, medication, and TENS Relief from Meds: 5  Family History  Problem Relation Age of Onset   Arthritis Mother    Heart disease Mother    Diabetes Mother    Heart disease Father    Diabetes Father    Social History   Socioeconomic History   Marital status: Significant Other    Spouse name: Not on file    Number of children: Not on file   Years of education: Not on file   Highest education level: Not on file  Occupational History   Not on file  Tobacco Use   Smoking status: Never   Smokeless tobacco: Never  Vaping Use   Vaping Use: Never used  Substance and Sexual Activity   Alcohol use: No   Drug use: Never   Sexual activity: Not on file  Other Topics Concern   Not on file  Social History Narrative   Not on file   Social Determinants of Health   Financial Resource Strain: Not on file  Food Insecurity: Not on file  Transportation Needs: Not on file  Physical Activity: Not on file  Stress: Not on file  Social Connections: Not on file   Past Surgical History:  Procedure Laterality Date   ANTERIOR CERVICAL DECOMP/DISCECTOMY FUSION  02-15-2008  @[facility]    C4--5,  C6--7, C7--T1   COLONOSCOPY     INGUINAL HERNIA REPAIR Bilateral 12/13/2020   Procedure: LAPAROSCOPIC BILATERAL INGUINAL HERNIA REPAIR AND POSSIBLE APPENDECTOMY;  Surgeon: Michael Boston, MD;  Location: [place];  Service: General;  Laterality: Bilateral;  ERAS   Past Surgical History:  Procedure Laterality Date   ANTERIOR CERVICAL DECOMP/DISCECTOMY FUSION  02-15-2008  @[facility]    C4--5,  C6--7, C7--T1   COLONOSCOPY     INGUINAL HERNIA REPAIR Bilateral 12/13/2020   Procedure: LAPAROSCOPIC BILATERAL INGUINAL HERNIA REPAIR AND POSSIBLE APPENDECTOMY;  Surgeon: Michael Boston, MD;  Location: [place];  Service: General;  Laterality: Bilateral;  ERAS   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    Family history of adverse reaction to anesthesia    father--- ponv   GERD (gastroesophageal reflux disease)    occasional, watches diet   Greater trochanteric bursitis of right hip    History of kidney  stones    Hypertension    Inguinal hernia, bilateral    Lumbar spondylosis    Nocturia    Peripheral neuropathy    bilateral lower legs and a little of left arm   Postlaminectomy syndrome, cervical region    Primary localized osteoarthrosis, lower leg    Radiculopathy, multiple sites in spine    cervical , thoracic, lumbar   Type 2 diabetes mellitus ([place])    followed by pcp---  (12-11-2020 checks blood sugar 3 times wkly,  fasting sugar--- 125 )   Wears glasses    There were no vitals taken for this visit.  Opioid Risk Score:   Fall Risk Score:  `1  Depression screen [facility] 2/9     10/21/2022    3:29 PM 08/26/2022    3:14 PM 05/20/2022    3:10 PM 12/24/2021    3:09 PM 11/19/2021    3:14 PM 10/22/2021    3:31 PM 09/03/2021    3:23 PM  Depression screen PHQ 2/9  Decreased Interest 0 0 1 0 0 0 1  Down, Depressed, Hopeless 0 0 1 0 0 0 1  PHQ - 2 Score 0 0 2 0 0 0 2      Review of Systems  Musculoskeletal:  Positive for back pain, gait problem and neck pain.  All other systems reviewed and are negative.     Objective:   Physical Exam Vitals and nursing note reviewed.  Constitutional:      Appearance: Normal appearance.  Cardiovascular:     Rate and Rhythm: Normal rate and regular rhythm.     Pulses: Normal pulses.     Heart sounds: Normal heart sounds.  Pulmonary:     Effort: Pulmonary effort is normal.     Breath sounds: Normal breath sounds.  Musculoskeletal:     Cervical back: Normal range of motion and neck supple.     Comments: Normal Muscle Bulk and Muscle Testing Reveals:  Upper Extremities: Full ROM and Muscle Strength 5/5 Bilateral AC Joint Tenderness Thoracic and Lumbar Hypersensitivity Right Greater Trochanter Tenderness Lower Extremities: Full ROM and Muscle Strength 5/5 Arises from Chair with ease Narrow Based Gait     Skin:    General: Skin is warm and dry.  Neurological:     Mental Status: He is alert and oriented to person, place, and time.   Psychiatric:        Mood and Affect: Mood normal.        Behavior: Behavior normal.         Assessment & Plan:  1. Cervical postlaminectomy syndrome, Hx of ACDF C4-5, C6-7, C7-T1, 02/2008. Continue current medication and alternate using heat and ice therapy. 11/25/2022 2. Spondylosis of L-spine . Continue current treatment regime. 11/25/2022 3. Chronic Pain Syndrome Refilled: Xtampza 18 mg one capsule every 12 hours for pain #60 and Oxycodone 5/325mg  one tablet twice a day, may take a extra tablet  when pain is sever # 75.  Continue Voltaren Gel/ Flector Patch.. We will continue the opioid monitoring program, this consists of regular clinic visits, examinations, urine drug screen, pill counts as well as use of [place] Controlled Substance Reporting system. A 12 month History has been reviewed on the [place] Controlled Substance Reporting System on 11/25/2022. 4. Diabetes with neuropathy. Continue current medication regimen with  Gabapentin. Keep blood sugars under tight control. Continue to monitor. 11/25/2022 5. Cervicalgia/Cervical Radiculopathy/ Thoracic Radiculopathy/ Lumbar Radiculopathy:Continue Gabapentin.11/25/2022 6. Right Greater Trochanter Bursitis: Continue Ice/Heat and Current medication regimen. 11/25/2022 7. Chronic Pain Syndrome/ Myofascial Pain: Continue Flector Patch/ Alternates with Voltaren Gel. 11/25/2022 8. Sacroiliac Pain: Continue HEP as Tolerated. Continue to Monitor.11/25/2022 9. Chronic Right Ankle Pain: .Continue HEP as tolerated. Continue to Monitor. 11/25/2022. 10. Post-Op Pain:  No complaints Today.  S/P LAPAROSCOPIC BILATERAL INGUINAL HERNIA REPAIR AND POSSIBLE APPENDECTOMY Dr Michaell Cowing Following.   11. Polyarthralgia: Continue HEP as Tolerated. Continue to monitor. 11/25/2022   F/U in 1 month

## 2022-11-27 ENCOUNTER — Telehealth: Payer: Self-pay

## 2022-11-27 NOTE — Telephone Encounter (Signed)
PA for Xtampza and COM said Please advise the dispensing pharmacy to contact the [place] at [phone] for assistance.

## 2022-11-28 LAB — TOXASSURE SELECT,+ANTIDEPR,UR

## 2022-12-01 NOTE — Telephone Encounter (Signed)
[name] from [facility] stating that the PA of David [name] was approved on the wrong criteria. She needs info to resubmit the PA on the right criteria. Questions answered and awaiting determination.

## 2022-12-22 ENCOUNTER — Telehealth: Payer: Self-pay | Admitting: Physical Medicine & Rehabilitation

## 2022-12-22 MED ORDER — XTAMPZA ER 18 MG PO C12A: 1.0000 | EXTENDED_RELEASE_CAPSULE | Freq: Two times a day (BID) | ORAL | 0 refills | Status: DC

## 2022-12-22 NOTE — Addendum Note (Signed)
Addended by: Charlett Blake on: 12/22/2022 04:08 PM   Modules accepted: Orders

## 2022-12-22 NOTE — Telephone Encounter (Signed)
Patient need medication refill on oxyCODONE ER (XTAMPZA ER) 18 MG C12  and oxycodone acetaminophen on 2/24, [facility] using [facility] [address]

## 2022-12-23 ENCOUNTER — Ambulatory Visit: Payer: No Typology Code available for payment source | Admitting: Registered Nurse

## 2022-12-24 ENCOUNTER — Encounter
Payer: No Typology Code available for payment source | Attending: Physical Medicine & Rehabilitation | Admitting: Registered Nurse

## 2022-12-25 ENCOUNTER — Other Ambulatory Visit: Payer: Self-pay | Admitting: Physical Medicine & Rehabilitation

## 2022-12-25 MED ORDER — OXYCODONE-ACETAMINOPHEN 5-325 MG PO TABS: 1.0000 | ORAL_TABLET | Freq: Two times a day (BID) | ORAL | 0 refills | Status: DC

## 2023-01-08 ENCOUNTER — Telehealth: Payer: Self-pay | Admitting: *Deleted

## 2023-01-08 NOTE — Telephone Encounter (Signed)
Prior auth submitted to [employer] via CoverMyMeds for [employer] ER 18 mg #60/30d.  Approved 01/08/2023 to 07/07/2023.

## 2023-01-20 ENCOUNTER — Encounter: Payer: Self-pay | Admitting: Registered Nurse

## 2023-01-20 ENCOUNTER — Encounter
Payer: No Typology Code available for payment source | Attending: Physical Medicine & Rehabilitation | Admitting: Registered Nurse

## 2023-01-20 VITALS — BP 139/76 | HR 74 | Ht 75.0 in | Wt 226.0 lb

## 2023-01-20 DIAGNOSIS — M542 Cervicalgia: Secondary | ICD-10-CM

## 2023-01-20 DIAGNOSIS — G8929 Other chronic pain: Secondary | ICD-10-CM

## 2023-01-20 DIAGNOSIS — M25571 Pain in right ankle and joints of right foot: Secondary | ICD-10-CM | POA: Insufficient documentation

## 2023-01-20 DIAGNOSIS — M5416 Radiculopathy, lumbar region: Secondary | ICD-10-CM | POA: Diagnosis present

## 2023-01-20 DIAGNOSIS — M47817 Spondylosis without myelopathy or radiculopathy, lumbosacral region: Secondary | ICD-10-CM

## 2023-01-20 DIAGNOSIS — M7061 Trochanteric bursitis, right hip: Secondary | ICD-10-CM | POA: Diagnosis present

## 2023-01-20 DIAGNOSIS — G5692 Unspecified mononeuropathy of left upper limb: Secondary | ICD-10-CM | POA: Diagnosis present

## 2023-01-20 DIAGNOSIS — M546 Pain in thoracic spine: Secondary | ICD-10-CM | POA: Diagnosis not present

## 2023-01-20 DIAGNOSIS — M25551 Pain in right hip: Secondary | ICD-10-CM | POA: Diagnosis present

## 2023-01-20 DIAGNOSIS — G5793 Unspecified mononeuropathy of bilateral lower limbs: Secondary | ICD-10-CM

## 2023-01-20 DIAGNOSIS — M5412 Radiculopathy, cervical region: Secondary | ICD-10-CM

## 2023-01-20 MED ORDER — OXYCODONE-ACETAMINOPHEN 5-325 MG PO TABS: 1.0000 | ORAL_TABLET | Freq: Two times a day (BID) | ORAL | 0 refills | Status: DC

## 2023-01-20 MED ORDER — XTAMPZA ER 18 MG PO C12A: 1.0000 | EXTENDED_RELEASE_CAPSULE | Freq: Two times a day (BID) | ORAL | 0 refills | Status: DC

## 2023-01-20 NOTE — Progress Notes (Signed)
Subjective:    Patient ID: David [name], male    DOB: 07-30-1976, 47 y.o.   MRN: [id number]  HPI: David [name] is a 47 y.o. male who returns for follow up appointment for chronic pain and medication refill. He states his pain is located in his neck radiating into his bilateral shoulders, left arm with tingling, let ring finger, Pinky finger and thumb. He also reports upper- lower back pain radiating into his right hip and right ankle and bilateral feet pain with tingling and numbness. He  rates his pain 5. His current exercise regime is walking and performing stretching exercises.  Mr. Kochevar Morphine equivalent is 74.63 MME. Hei s also prescribed Diazepam by Marcie Mowers NP  .We have discussed the black box warning of using opioids and benzodiazepines. I highlighted the dangers of using these drugs together and discussed the adverse events including respiratory suppression, overdose, cognitive impairment and importance of compliance with current regimen. We will continue to monitor and adjust as indicated.    Last UDS was Performed    Pain Inventory Average Pain 5 Pain Right Now 5 My pain is intermittent, constant, sharp, burning, dull, stabbing, tingling, and aching  In the last 24 hours, has pain interfered with the following? General activity 6 Relation with others 5 Enjoyment of life 5 What TIME of day is your pain at its worst? morning , daytime, evening, and night Sleep (in general) Fair  Pain is worse with: walking, bending, sitting, inactivity, standing, and some activites Pain improves with: rest, heat/ice, therapy/exercise, pacing activities, medication, and TENS Relief from Meds: 5  Family History  Problem Relation Age of Onset   Arthritis Mother    Heart disease Mother    Diabetes Mother    Heart disease Father    Diabetes Father    Social History   Socioeconomic History   Marital status: Significant Other    Spouse name: Not on file   Number of  children: Not on file   Years of education: Not on file   Highest education level: Not on file  Occupational History   Not on file  Tobacco Use   Smoking status: Never   Smokeless tobacco: Never  Vaping Use   Vaping Use: Never used  Substance and Sexual Activity   Alcohol use: No   Drug use: Never   Sexual activity: Not on file  Other Topics Concern   Not on file  Social History Narrative   Not on file   Social Determinants of Health   Financial Resource Strain: Not on file  Food Insecurity: Not on file  Transportation Needs: Not on file  Physical Activity: Not on file  Stress: Not on file  Social Connections: Not on file   Past Surgical History:  Procedure Laterality Date   ANTERIOR CERVICAL DECOMP/DISCECTOMY FUSION  02-15-2008  @[facility]    C4--5,  C6--7, C7--T1   COLONOSCOPY     INGUINAL HERNIA REPAIR Bilateral 12/13/2020   Procedure: LAPAROSCOPIC BILATERAL INGUINAL HERNIA REPAIR AND POSSIBLE APPENDECTOMY;  Surgeon: Michael Boston, MD;  Location: [place];  Service: General;  Laterality: Bilateral;  ERAS   Past Surgical History:  Procedure Laterality Date   ANTERIOR CERVICAL DECOMP/DISCECTOMY FUSION  02-15-2008  @[facility]    C4--5,  C6--7, C7--T1   COLONOSCOPY     INGUINAL HERNIA REPAIR Bilateral 12/13/2020   Procedure: LAPAROSCOPIC BILATERAL INGUINAL HERNIA REPAIR AND POSSIBLE APPENDECTOMY;  Surgeon: Michael Boston, MD;  Location: [place];  Service:  General;  Laterality: Bilateral;  ERAS   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    Family history of adverse reaction to anesthesia    father--- ponv   GERD (gastroesophageal reflux disease)    occasional, watches diet   Greater trochanteric bursitis of right hip    History of kidney stones     Hypertension    Inguinal hernia, bilateral    Lumbar spondylosis    Nocturia    Peripheral neuropathy    bilateral lower legs and a little of left arm   Postlaminectomy syndrome, cervical region    Primary localized osteoarthrosis, lower leg    Radiculopathy, multiple sites in spine    cervical , thoracic, lumbar   Type 2 diabetes mellitus ([place])    followed by pcp---  (12-11-2020 checks blood sugar 3 times wkly,  fasting sugar--- 125 )   Wears glasses    BP 139/76   Pulse 74   Ht 6\' 3"  (1.905 m)   Wt 226 lb (102.5 kg)   SpO2 98%   BMI 28.25 kg/m   Opioid Risk Score:   Fall Risk Score:  `1  Depression screen PHQ 2/9     11/25/2022    3:00 PM 10/21/2022    3:29 PM 08/26/2022    3:14 PM 05/20/2022    3:10 PM 12/24/2021    3:09 PM 11/19/2021    3:14 PM 10/22/2021    3:31 PM  Depression screen PHQ 2/9  Decreased Interest 0 0 0 1 0 0 0  Down, Depressed, Hopeless 0 0 0 1 0 0 0  PHQ - 2 Score 0 0 0 2 0 0 0      Review of Systems  Musculoskeletal:  Positive for back pain.       B/L leg, hip pain, LT arm pain       Objective:   Physical Exam Vitals and nursing note reviewed.  Constitutional:      Appearance: Normal appearance.  Cardiovascular:     Rate and Rhythm: Normal rate and regular rhythm.     Pulses: Normal pulses.     Heart sounds: Normal heart sounds.  Pulmonary:     Effort: Pulmonary effort is normal.     Breath sounds: Normal breath sounds.  Musculoskeletal:     Cervical back: Normal range of motion and neck supple.     Comments: Normal Muscle Bulk and Muscle Testing Reveals:  Upper Extremities: Full ROM and Muscle Strength  5/5 Bilateral AC Joint Tenderness Thoracic And Lumbar Hypersensitivity Right Greater Trochanter Tenderness Lower Extremities: Full ROM and Muscle Strength 5/5 Arises from Chair slowly Narrow Based  Gait     Skin:    General: Skin is warm and dry.  Neurological:     Mental Status: He is alert and oriented to person, place,  and time.  Psychiatric:        Mood and Affect: Mood normal.        Behavior: Behavior normal.         Assessment & Plan:  1. Cervical postlaminectomy syndrome, Hx of ACDF C4-5, C6-7, C7-T1, 02/2008. Continue current medication and alternate using heat and ice therapy. 01/20/2023 2. Spondylosis of L-spine . Continue current treatment regime. 01/20/2023 3. Chronic Pain Syndrome Refilled:  Xtampza 18 mg one capsule every 12 hours for pain #60 and Oxycodone 5/325mg  one tablet twice a day, may take a extra tablet when pain is sever # 75.  Continue Voltaren Gel/ Flector Patch.. We will continue the opioid monitoring program, this consists of regular clinic visits, examinations, urine drug screen, pill counts as well as use of [place] Controlled Substance Reporting system. A 12 month History has been reviewed on the [place] on 01/20/2023. 4. Diabetes with neuropathy. Continue current medication regimen with  Gabapentin. Keep blood sugars under tight control. Continue to monitor. 01/20/2023 5. Cervicalgia/Cervical Radiculopathy/ Thoracic Radiculopathy/ Lumbar Radiculopathy:Continue Gabapentin.01/20/2023 6. Right Greater Trochanter Bursitis: Continue Ice/Heat and Current medication regimen. 01/20/2023 7. Chronic Pain Syndrome/ Myofascial Pain: Continue Flector Patch/ Alternates with Voltaren Gel. 01/20/2023 8. Sacroiliac Pain: Continue HEP as Tolerated. Continue to Monitor.01/20/2023 9. Chronic Right Ankle Pain: .Continue HEP as tolerated. Continue to Monitor. 01/20/2023. 10. Post-Op Pain:  No complaints Today.  S/P LAPAROSCOPIC BILATERAL INGUINAL HERNIA REPAIR AND POSSIBLE APPENDECTOMY Dr Johney Maine Following.   11. Polyarthralgia: Continue HEP as Tolerated. Continue to monitor. 01/20/2023   F/U in 1 month

## 2023-02-17 ENCOUNTER — Encounter: Payer: Self-pay | Admitting: Registered Nurse

## 2023-02-17 ENCOUNTER — Encounter
Payer: No Typology Code available for payment source | Attending: Physical Medicine & Rehabilitation | Admitting: Registered Nurse

## 2023-02-17 ENCOUNTER — Telehealth: Payer: Self-pay | Admitting: *Deleted

## 2023-02-17 VITALS — BP 102/67 | HR 81 | Ht 75.0 in | Wt 224.0 lb

## 2023-02-17 DIAGNOSIS — Z79891 Long term (current) use of opiate analgesic: Secondary | ICD-10-CM | POA: Diagnosis present

## 2023-02-17 DIAGNOSIS — M5416 Radiculopathy, lumbar region: Secondary | ICD-10-CM | POA: Diagnosis present

## 2023-02-17 DIAGNOSIS — Z5181 Encounter for therapeutic drug level monitoring: Secondary | ICD-10-CM | POA: Diagnosis present

## 2023-02-17 DIAGNOSIS — M5412 Radiculopathy, cervical region: Secondary | ICD-10-CM | POA: Insufficient documentation

## 2023-02-17 DIAGNOSIS — M542 Cervicalgia: Secondary | ICD-10-CM | POA: Insufficient documentation

## 2023-02-17 DIAGNOSIS — G894 Chronic pain syndrome: Secondary | ICD-10-CM | POA: Insufficient documentation

## 2023-02-17 DIAGNOSIS — M25571 Pain in right ankle and joints of right foot: Secondary | ICD-10-CM | POA: Diagnosis present

## 2023-02-17 DIAGNOSIS — G5793 Unspecified mononeuropathy of bilateral lower limbs: Secondary | ICD-10-CM | POA: Diagnosis present

## 2023-02-17 DIAGNOSIS — M7061 Trochanteric bursitis, right hip: Secondary | ICD-10-CM | POA: Insufficient documentation

## 2023-02-17 DIAGNOSIS — M2550 Pain in unspecified joint: Secondary | ICD-10-CM | POA: Diagnosis present

## 2023-02-17 DIAGNOSIS — M25551 Pain in right hip: Secondary | ICD-10-CM | POA: Insufficient documentation

## 2023-02-17 DIAGNOSIS — M47817 Spondylosis without myelopathy or radiculopathy, lumbosacral region: Secondary | ICD-10-CM | POA: Diagnosis present

## 2023-02-17 DIAGNOSIS — M546 Pain in thoracic spine: Secondary | ICD-10-CM | POA: Insufficient documentation

## 2023-02-17 DIAGNOSIS — G8929 Other chronic pain: Secondary | ICD-10-CM | POA: Insufficient documentation

## 2023-02-17 MED ORDER — GABAPENTIN 400 MG PO CAPS: 400.0000 mg | ORAL_CAPSULE | Freq: Four times a day (QID) | ORAL | 2 refills | Status: DC

## 2023-02-17 MED ORDER — XTAMPZA ER 18 MG PO C12A: 1.0000 | EXTENDED_RELEASE_CAPSULE | Freq: Two times a day (BID) | ORAL | 0 refills | Status: DC

## 2023-02-17 MED ORDER — GABAPENTIN 100 MG PO CAPS: ORAL_CAPSULE | ORAL | 2 refills | Status: DC

## 2023-02-17 MED ORDER — OXYCODONE-ACETAMINOPHEN 5-325 MG PO TABS: 1.0000 | ORAL_TABLET | Freq: Two times a day (BID) | ORAL | 0 refills | Status: DC

## 2023-02-17 NOTE — Telephone Encounter (Signed)
Urine drug screen for this encounter is consistent for prescribed medication 

## 2023-02-17 NOTE — Progress Notes (Signed)
Subjective:    Patient ID: David [name], male    DOB: 1975-12-31, 47 y.o.   MRN: [id number]  HPI: David [name] is a 47 y.o. male who returns for follow up appointment for chronic pain and medication refill. He states his pain is located in his neck radiating into his bilateral shoulders L>R, mid- lower back pain radiating into his bilateral lower extremities. He also reports right hip pain and right ankle pain. Also reports generalized joint pain. He rates his pain 5. His current exercise regime is walking and performing stretching exercises.  Mr. Winegar Morphine equivalent is 78.75 MME.He is also prescribed Diazepam by Drusilla Kanner NP, last fill date 10/19/2022 .We have discussed the black box warning of using opioids and benzodiazepines. I highlighted the dangers of using these drugs together and discussed the adverse events including respiratory suppression, overdose, cognitive impairment and importance of compliance with current regimen. We will continue to monitor and adjust as indicated.   Last UDS was Performed 11/25/2022, it was consistent.    Pain Inventory Average Pain 5 Pain Right Now 5 My pain is intermittent, constant, sharp, burning, dull, stabbing, tingling, and aching  In the last 24 hours, has pain interfered with the following? General activity 6 Relation with others 5 Enjoyment of life 5 What TIME of day is your pain at its worst? morning , daytime, evening, and night Sleep (in general) Fair  Pain is worse with: walking, bending, sitting, inactivity, standing, and some activites Pain improves with: rest, heat/ice, therapy/exercise, pacing activities, medication, and TENS Relief from Meds: 5  Family History  Problem Relation Age of Onset   Arthritis Mother    Heart disease Mother    Diabetes Mother    Heart disease Father    Diabetes Father    Social History   Socioeconomic History   Marital status: Significant Other    Spouse name: Not on file    Number of children: Not on file   Years of education: Not on file   Highest education level: Not on file  Occupational History   Not on file  Tobacco Use   Smoking status: Never   Smokeless tobacco: Never  Vaping Use   Vaping Use: Never used  Substance and Sexual Activity   Alcohol use: No   Drug use: Never   Sexual activity: Not on file  Other Topics Concern   Not on file  Social History Narrative   Not on file   Social Determinants of Health   Financial Resource Strain: Not on file  Food Insecurity: Not on file  Transportation Needs: Not on file  Physical Activity: Not on file  Stress: Not on file  Social Connections: Not on file   Past Surgical History:  Procedure Laterality Date   ANTERIOR CERVICAL DECOMP/DISCECTOMY FUSION  02-15-2008     C4--5,  C6--7, C7--T1   COLONOSCOPY     INGUINAL HERNIA REPAIR Bilateral 12/13/2020   Procedure: LAPAROSCOPIC BILATERAL INGUINAL HERNIA REPAIR AND POSSIBLE APPENDECTOMY;  Surgeon: [name], MD;  Location: [facility];  Service: General;  Laterality: Bilateral;  ERAS   Past Surgical History:  Procedure Laterality Date   ANTERIOR CERVICAL DECOMP/DISCECTOMY FUSION  02-15-2008     C4--5,  C6--7, C7--T1   COLONOSCOPY     INGUINAL HERNIA REPAIR Bilateral 12/13/2020   Procedure: LAPAROSCOPIC BILATERAL INGUINAL HERNIA REPAIR AND POSSIBLE APPENDECTOMY;  Surgeon: [name], MD;  Location: [facility];  Service: General;  Laterality: Bilateral;  ERAS   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    Family history of adverse reaction to anesthesia    father--- ponv   GERD (gastroesophageal reflux disease)    occasional, watches diet   Greater trochanteric bursitis of right hip    History of kidney  stones    Hypertension    Inguinal hernia, bilateral    Lumbar spondylosis    Nocturia    Peripheral neuropathy    bilateral lower legs and a little of left arm   Postlaminectomy syndrome, cervical region    Primary localized osteoarthrosis, lower leg    Radiculopathy, multiple sites in spine    cervical , thoracic, lumbar   Type 2 diabetes mellitus ([facility])    followed by pcp---  (12-11-2020 checks blood sugar 3 times wkly,  fasting sugar--- 125 )   Wears glasses    There were no vitals taken for this visit.  Opioid Risk Score:   Fall Risk Score:  `1  Depression screen PHQ 2/9     01/20/2023    3:14 PM 11/25/2022    3:00 PM 10/21/2022    3:29 PM 08/26/2022    3:14 PM 05/20/2022    3:10 PM 12/24/2021    3:09 PM 11/19/2021    3:14 PM  Depression screen PHQ 2/9  Decreased Interest 0 0 0 0 1 0 0  Down, Depressed, Hopeless 0 0 0 0 1 0 0  PHQ - 2 Score 0 0 0 0 2 0 0    Review of Systems  Musculoskeletal:  Positive for arthralgias, back pain and neck pain.       Pain in left shoulder don to left hand. Pain in both hips and legs down to feet  All other systems reviewed and are negative.      Objective:   Physical Exam Vitals and nursing note reviewed.  Constitutional:      Appearance: Normal appearance.  Cardiovascular:     Rate and Rhythm: Normal rate and regular rhythm.     Pulses: Normal pulses.     Heart sounds: Normal heart sounds.  Pulmonary:     Effort: Pulmonary effort is normal.     Breath sounds: Normal breath sounds.  Musculoskeletal:     Cervical back: Normal range of motion and neck supple.     Comments: Normal Muscle Bulk and Muscle Testing Reveals:  Upper Extremities: Full ROM and Muscle Strength 5/5  Thoracic and Lumbar Hypersensitivity Right Greater Trochanter Tenderness Lower Extremities: Full ROM and Muscle Strength 5/5 Arises from Table with ease Narrow Based  Gait     Skin:    General: Skin is warm and dry.  Neurological:     Mental Status:  He is alert and oriented to person, place, and time.  Psychiatric:        Mood and Affect: Mood normal.        Behavior: Behavior normal.         Assessment & Plan:  1. Cervical postlaminectomy syndrome, Hx of ACDF C4-5, C6-7, C7-T1, 02/2008. Continue current medication and alternate using heat and ice therapy. 02/17/2023 2. Spondylosis of L-spine . Continue current treatment regime. 02/17/2023 3. Chronic Pain Syndrome Refilled: Xtampza 18 mg one capsule every 12 hours for pain #60  and Oxycodone 5/325mg  one tablet twice a day, may take a extra tablet when pain is sever # 75.  Continue Voltaren Gel/ Flector Patch.. We will continue the opioid monitoring program, this consists of regular clinic visits, examinations, urine drug screen, pill counts as well as use of [place] Controlled Substance Reporting system. A 12 month History has been reviewed on the [place] Controlled Substance Reporting System on 02/17/2023. 4. Diabetes with neuropathy. Continue current medication regimen with  Gabapentin. Keep blood sugars under tight control. Continue to monitor. 02/17/2023 5. Cervicalgia/Cervical Radiculopathy/ Thoracic Radiculopathy/ Lumbar Radiculopathy:Continue Gabapentin.02/17/2023 6. Right Greater Trochanter Bursitis: Continue Ice/Heat and Current medication regimen. 02/17/2023 7. Chronic Pain Syndrome/ Myofascial Pain: Continue Flector Patch/ Alternates with Voltaren Gel. 02/17/2023 8. Sacroiliac Pain: Continue HEP as Tolerated. Continue to Monitor.02/17/2023 9. Chronic Right Ankle Pain: .Continue HEP as tolerated. Continue to Monitor. 02/17/2023. 10. Post-Op Pain:  No complaints Today.  S/P LAPAROSCOPIC BILATERAL INGUINAL HERNIA REPAIR AND POSSIBLE APPENDECTOMY Dr Michaell Cowing Following.   11. Polyarthralgia: Continue HEP as Tolerated. Continue to monitor. 02/17/2023   F/U in 1 month

## 2023-03-17 ENCOUNTER — Encounter
Payer: No Typology Code available for payment source | Attending: Physical Medicine & Rehabilitation | Admitting: Registered Nurse

## 2023-03-17 VITALS — BP 119/76 | HR 83 | Ht 75.0 in | Wt 225.0 lb

## 2023-03-17 DIAGNOSIS — G8929 Other chronic pain: Secondary | ICD-10-CM | POA: Diagnosis present

## 2023-03-17 DIAGNOSIS — Z79891 Long term (current) use of opiate analgesic: Secondary | ICD-10-CM | POA: Insufficient documentation

## 2023-03-17 DIAGNOSIS — M542 Cervicalgia: Secondary | ICD-10-CM | POA: Insufficient documentation

## 2023-03-17 DIAGNOSIS — G5793 Unspecified mononeuropathy of bilateral lower limbs: Secondary | ICD-10-CM | POA: Diagnosis present

## 2023-03-17 DIAGNOSIS — M5416 Radiculopathy, lumbar region: Secondary | ICD-10-CM | POA: Diagnosis not present

## 2023-03-17 DIAGNOSIS — Z5181 Encounter for therapeutic drug level monitoring: Secondary | ICD-10-CM

## 2023-03-17 DIAGNOSIS — M5412 Radiculopathy, cervical region: Secondary | ICD-10-CM | POA: Insufficient documentation

## 2023-03-17 DIAGNOSIS — M7061 Trochanteric bursitis, right hip: Secondary | ICD-10-CM | POA: Diagnosis present

## 2023-03-17 DIAGNOSIS — G894 Chronic pain syndrome: Secondary | ICD-10-CM | POA: Insufficient documentation

## 2023-03-17 DIAGNOSIS — M546 Pain in thoracic spine: Secondary | ICD-10-CM | POA: Insufficient documentation

## 2023-03-17 DIAGNOSIS — M47817 Spondylosis without myelopathy or radiculopathy, lumbosacral region: Secondary | ICD-10-CM | POA: Insufficient documentation

## 2023-03-17 DIAGNOSIS — M25571 Pain in right ankle and joints of right foot: Secondary | ICD-10-CM | POA: Diagnosis present

## 2023-03-17 MED ORDER — OXYCODONE-ACETAMINOPHEN 5-325 MG PO TABS: 1.0000 | ORAL_TABLET | Freq: Two times a day (BID) | ORAL | 0 refills | Status: DC

## 2023-03-17 MED ORDER — XTAMPZA ER 18 MG PO C12A: 1.0000 | EXTENDED_RELEASE_CAPSULE | Freq: Two times a day (BID) | ORAL | 0 refills | Status: DC

## 2023-03-17 NOTE — Progress Notes (Unsigned)
Subjective:    Patient ID: David [name], male    DOB: [birth year], 47 y.o.   MRN: [id number]  HPI: David [name] is a 47 y.o. male who returns for follow up appointment for chronic pain and medication refill. states *** pain is located in  ***. rates pain ***. current exercise regime is walking and performing stretching exercises.  David [name] Morphine equivalent is *** MME.       Pain Inventory Average Pain 5 Pain Right Now 5 My pain is intermittent, constant, sharp, burning, dull, stabbing, tingling, and aching  In the last 24 hours, has pain interfered with the following? General activity 5 Relation with others 4 Enjoyment of life 5 What TIME of day is your pain at its worst? morning , daytime, evening, and night Sleep (in general) Fair  Pain is worse with: walking, bending, sitting, inactivity, standing, and some activites Pain improves with: rest, heat/ice, therapy/exercise, pacing activities, medication, and TENS Relief from Meds: 5  Family History  Problem Relation Age of Onset   Arthritis Mother    Heart disease Mother    Diabetes Mother    Heart disease Father    Diabetes Father    Social History   Socioeconomic History   Marital status: Significant Other    Spouse name: Not on file   Number of children: Not on file   Years of education: Not on file   Highest education level: Not on file  Occupational History   Not on file  Tobacco Use   Smoking status: Never   Smokeless tobacco: Never  Vaping Use   Vaping Use: Never used  Substance and Sexual Activity   Alcohol use: No   Drug use: Never   Sexual activity: Not on file  Other Topics Concern   Not on file  Social History Narrative   Not on file   Social Determinants of Health   Financial Resource Strain: Not on file  Food Insecurity: Not on file  Transportation Needs: Not on file  Physical Activity: Not on file  Stress: Not on file  Social Connections: Not on file   Past Surgical  History:  Procedure Laterality Date   ANTERIOR CERVICAL DECOMP/DISCECTOMY FUSION  02-15-2008  @[facility]    C4--5,  C6--7, C7--T1   COLONOSCOPY     INGUINAL HERNIA REPAIR Bilateral 12/13/2020   Procedure: LAPAROSCOPIC BILATERAL INGUINAL HERNIA REPAIR AND POSSIBLE APPENDECTOMY;  Surgeon: [name], MD;  Location: [facility];  Service: General;  Laterality: Bilateral;  ERAS   Past Surgical History:  Procedure Laterality Date   ANTERIOR CERVICAL DECOMP/DISCECTOMY FUSION  02-15-2008  @[facility]    C4--5,  C6--7, C7--T1   COLONOSCOPY     INGUINAL HERNIA REPAIR Bilateral 12/13/2020   Procedure: LAPAROSCOPIC BILATERAL INGUINAL HERNIA REPAIR AND POSSIBLE APPENDECTOMY;  Surgeon: [name], MD;  Location: [facility];  Service: General;  Laterality: Bilateral;  ERAS   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    Family history of adverse reaction to anesthesia    father--- ponv   GERD (gastroesophageal reflux disease)    occasional, watches diet   Greater trochanteric bursitis of right hip    History of kidney stones    Hypertension    Inguinal hernia, bilateral  Lumbar spondylosis    Nocturia    Peripheral neuropathy    bilateral lower legs and a little of left arm   Postlaminectomy syndrome, cervical region    Primary localized osteoarthrosis, lower leg    Radiculopathy, multiple sites in spine    cervical , thoracic, lumbar   Type 2 diabetes mellitus (HCC)    followed by pcp---  (12-11-2020 checks blood sugar 3 times wkly,  fasting sugar--- 125 )   Wears glasses    BP 119/76   Pulse 83   Ht 6\' 3"  (1.905 m)   Wt 225 lb (102.1 kg)   SpO2 99%   BMI 28.12 kg/m   Opioid Risk Score:   Fall Risk Score:  `1  Depression screen [facility] 2/9     03/17/2023     3:18 PM 02/17/2023    3:15 PM 01/20/2023    3:14 PM 11/25/2022    3:00 PM 10/21/2022    3:29 PM 08/26/2022    3:14 PM 05/20/2022    3:10 PM  Depression screen [facility] 2/9  Decreased Interest 0 0 0 0 0 0 1  Down, Depressed, Hopeless 0 0 0 0 0 0 1  PHQ - 2 Score 0 0 0 0 0 0 2     Review of Systems  Musculoskeletal:  Positive for arthralgias, back pain and myalgias.       B/L leg foot pain Lt arm pain  All other systems reviewed and are negative.     Objective:   Physical Exam        Assessment & Plan:   1. Cervical postlaminectomy syndrome, Hx of ACDF C4-5, C6-7, C7-T1, 02/2008. Continue current medication and alternate using heat and ice therapy. 02/17/2023 2. Spondylosis of L-spine . Continue current treatment regime. 02/17/2023 3. Chronic Pain Syndrome Refilled: Xtampza 18 mg one capsule every 12 hours for pain #60 and Oxycodone 5/325mg  one tablet twice a day, may take a extra tablet when pain is sever # 75.  Continue Voltaren Gel/ Flector Patch.. We will continue the opioid monitoring program, this consists of regular clinic visits, examinations, urine drug screen, pill counts as well as use of [place] Controlled Substance Reporting system. A 12 month History has been reviewed on the [place] Controlled Substance Reporting System on 02/17/2023. 4. Diabetes with neuropathy. Continue current medication regimen with  Gabapentin. Keep blood sugars under tight control. Continue to monitor. 02/17/2023 5. Cervicalgia/Cervical Radiculopathy/ Thoracic Radiculopathy/ Lumbar Radiculopathy:Continue Gabapentin.02/17/2023 6. Right Greater Trochanter Bursitis: Continue Ice/Heat and Current medication regimen. 02/17/2023 7. Chronic Pain Syndrome/ Myofascial Pain: Continue Flector Patch/ Alternates with Voltaren Gel. 02/17/2023 8. Sacroiliac Pain: Continue HEP as Tolerated. Continue to Monitor.02/17/2023 9. Chronic Right Ankle Pain: .Continue HEP as tolerated. Continue to Monitor.  02/17/2023. 10. Post-Op Pain:  No complaints Today.  S/P LAPAROSCOPIC BILATERAL INGUINAL HERNIA REPAIR AND POSSIBLE APPENDECTOMY Dr Michaell Cowing Following.   11. Polyarthralgia: Continue HEP as Tolerated. Continue to monitor. 02/17/2023   F/U in 1 month

## 2023-03-18 ENCOUNTER — Encounter: Payer: Self-pay | Admitting: Registered Nurse

## 2023-03-23 LAB — TOXASSURE SELECT,+ANTIDEPR,UR

## 2023-04-08 ENCOUNTER — Telehealth: Payer: Self-pay | Admitting: *Deleted

## 2023-04-08 NOTE — Telephone Encounter (Signed)
Urine drug screen for this encounter is consistent for prescribed medication 

## 2023-04-14 ENCOUNTER — Encounter
Payer: No Typology Code available for payment source | Attending: Physical Medicine & Rehabilitation | Admitting: Registered Nurse

## 2023-04-14 VITALS — BP 102/68 | HR 76 | Ht 75.0 in | Wt 227.0 lb

## 2023-04-14 DIAGNOSIS — M7061 Trochanteric bursitis, right hip: Secondary | ICD-10-CM | POA: Diagnosis present

## 2023-04-14 DIAGNOSIS — M25571 Pain in right ankle and joints of right foot: Secondary | ICD-10-CM | POA: Diagnosis present

## 2023-04-14 DIAGNOSIS — M546 Pain in thoracic spine: Secondary | ICD-10-CM | POA: Insufficient documentation

## 2023-04-14 DIAGNOSIS — M25561 Pain in right knee: Secondary | ICD-10-CM

## 2023-04-14 DIAGNOSIS — Z79891 Long term (current) use of opiate analgesic: Secondary | ICD-10-CM | POA: Diagnosis present

## 2023-04-14 DIAGNOSIS — G5793 Unspecified mononeuropathy of bilateral lower limbs: Secondary | ICD-10-CM | POA: Diagnosis present

## 2023-04-14 DIAGNOSIS — G894 Chronic pain syndrome: Secondary | ICD-10-CM

## 2023-04-14 DIAGNOSIS — Z5181 Encounter for therapeutic drug level monitoring: Secondary | ICD-10-CM | POA: Diagnosis present

## 2023-04-14 DIAGNOSIS — M542 Cervicalgia: Secondary | ICD-10-CM | POA: Diagnosis present

## 2023-04-14 DIAGNOSIS — M2550 Pain in unspecified joint: Secondary | ICD-10-CM | POA: Diagnosis present

## 2023-04-14 DIAGNOSIS — M5412 Radiculopathy, cervical region: Secondary | ICD-10-CM

## 2023-04-14 DIAGNOSIS — G5692 Unspecified mononeuropathy of left upper limb: Secondary | ICD-10-CM | POA: Diagnosis present

## 2023-04-14 DIAGNOSIS — G8929 Other chronic pain: Secondary | ICD-10-CM

## 2023-04-14 DIAGNOSIS — M47817 Spondylosis without myelopathy or radiculopathy, lumbosacral region: Secondary | ICD-10-CM

## 2023-04-14 DIAGNOSIS — M5416 Radiculopathy, lumbar region: Secondary | ICD-10-CM | POA: Diagnosis present

## 2023-04-14 MED ORDER — OXYCODONE-ACETAMINOPHEN 5-325 MG PO TABS: 1.0000 | ORAL_TABLET | Freq: Two times a day (BID) | ORAL | 0 refills | Status: DC

## 2023-04-14 MED ORDER — XTAMPZA ER 18 MG PO C12A: 1.0000 | EXTENDED_RELEASE_CAPSULE | Freq: Two times a day (BID) | ORAL | 0 refills | Status: DC

## 2023-04-14 NOTE — Progress Notes (Signed)
Subjective:    Patient ID: David [name], male    DOB: [birth year], 47 y.o.   MRN: [id number]  HPI: David [name] is a 47 y.o. male who returns for follow up appointment for chronic pain and medication refill. He states his pain is located in his neck radiating into his bilateral shoulders, left arm with tingling into his left pink finger and ring finger. He also reports mid- lower back pain radiating into his bilateral lower extremities, right hip pain, right knee pain for the last 2 weeks denies falling and right ankle pain. He rates his pain 6. His current exercise regime is walking and performing stretching exercises.  [name] Morphine equivalent is 78.75 MME. He is also prescribed Diazepam  by Drusilla Kanner NP .We have discussed the black box warning of using opioids and benzodiazepines. I highlighted the dangers of using these drugs together and discussed the adverse events including respiratory suppression, overdose, cognitive impairment and importance of compliance with current regimen. We will continue to monitor and adjust as indicated.    Last UDS was Performed on 03/17/2023, it was consistent.     Pain Inventory Average Pain 5 Pain Right Now 6 My pain is intermittent, constant, sharp, burning, dull, stabbing, tingling, and aching  In the last 24 hours, has pain interfered with the following? General activity 7 Relation with others 5 Enjoyment of life 5 What TIME of day is your pain at its worst? morning , daytime, evening, and night Sleep (in general) Fair  Pain is worse with: walking, bending, sitting, inactivity, standing, and some activites Pain improves with: rest, heat/ice, therapy/exercise, pacing activities, medication, and TENS Relief from Meds: 5  Family History  Problem Relation Age of Onset   Arthritis Mother    Heart disease Mother    Diabetes Mother    Heart disease Father    Diabetes Father    Social History   Socioeconomic History   Marital  status: Significant Other    Spouse name: Not on file   Number of children: Not on file   Years of education: Not on file   Highest education level: Not on file  Occupational History   Not on file  Tobacco Use   Smoking status: Never   Smokeless tobacco: Never  Vaping Use   Vaping Use: Never used  Substance and Sexual Activity   Alcohol use: No   Drug use: Never   Sexual activity: Not on file  Other Topics Concern   Not on file  Social History Narrative   Not on file   Social Determinants of Health   Financial Resource Strain: Not on file  Food Insecurity: Not on file  Transportation Needs: Not on file  Physical Activity: Not on file  Stress: Not on file  Social Connections: Not on file   Past Surgical History:  Procedure Laterality Date   ANTERIOR CERVICAL DECOMP/DISCECTOMY FUSION  02-15-2008  @[facility]    C4--5,  C6--7, C7--T1   COLONOSCOPY     INGUINAL HERNIA REPAIR Bilateral 12/13/2020   Procedure: LAPAROSCOPIC BILATERAL INGUINAL HERNIA REPAIR AND POSSIBLE APPENDECTOMY;  Surgeon: David Soda, MD;  Location: [facility];  Service: General;  Laterality: Bilateral;  ERAS   Past Surgical History:  Procedure Laterality Date   ANTERIOR CERVICAL DECOMP/DISCECTOMY FUSION  02-15-2008  @[facility]    C4--5,  C6--7, C7--T1   COLONOSCOPY     INGUINAL HERNIA REPAIR Bilateral 12/13/2020   Procedure: LAPAROSCOPIC BILATERAL INGUINAL HERNIA REPAIR AND POSSIBLE APPENDECTOMY;  Surgeon: David Soda, MD;  Location: [facility];  Service: General;  Laterality: Bilateral;  ERAS   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    Family history of adverse reaction to anesthesia    father--- ponv   GERD (gastroesophageal reflux disease)    occasional, watches diet    Greater trochanteric bursitis of right hip    History of kidney stones    Hypertension    Inguinal hernia, bilateral    Lumbar spondylosis    Nocturia    Peripheral neuropathy    bilateral lower legs and a little of left arm   Postlaminectomy syndrome, cervical region    Primary localized osteoarthrosis, lower leg    Radiculopathy, multiple sites in spine    cervical , thoracic, lumbar   Type 2 diabetes mellitus (HCC)    followed by pcp---  (12-11-2020 checks blood sugar 3 times wkly,  fasting sugar--- 125 )   Wears glasses    BP 102/68   Pulse 76   Ht 6\' 3"  (1.905 m)   Wt 227 lb (103 kg)   SpO2 97%   BMI 28.37 kg/m   Opioid Risk Score:   Fall Risk Score:  `1  Depression screen [facility] 2/9     03/17/2023    3:18 PM 02/17/2023    3:15 PM 01/20/2023    3:14 PM 11/25/2022    3:00 PM 10/21/2022    3:29 PM 08/26/2022    3:14 PM 05/20/2022    3:10 PM  Depression screen [facility] 2/9  Decreased Interest 0 0 0 0 0 0 1  Down, Depressed, Hopeless 0 0 0 0 0 0 1  PHQ - 2 Score 0 0 0 0 0 0 2     Review of Systems  Musculoskeletal:  Positive for back pain.       Bilateral leg pain  All other systems reviewed and are negative.     Objective:   Physical Exam Vitals and nursing note reviewed.  Constitutional:      Appearance: Normal appearance.  Cardiovascular:     Rate and Rhythm: Normal rate and regular rhythm.     Pulses: Normal pulses.     Heart sounds: Normal heart sounds.  Pulmonary:     Effort: Pulmonary effort is normal.     Breath sounds: Normal breath sounds.  Musculoskeletal:     Cervical back: Normal range of motion and neck supple.     Comments: Normal Muscle Bulk and Muscle Testing Reveals:  Upper Extremities: Full ROM and Muscle Strength 5/5 Bilateral AC Joint Tenderness Thoracic and Lumbar Hypersensitivity Right Greater Trochanter Tenderness  Lower Extremities: Full ROM and Muscle Strength 5/5 Arises from Table with Ease Narrow based  Gait     Skin:     General: Skin is warm and dry.  Neurological:     Mental Status: He is alert and oriented to person, place, and time.  Psychiatric:        Mood and Affect: Mood normal.        Behavior: Behavior normal.         Assessment & Plan:  1. Cervical postlaminectomy syndrome, Hx of ACDF C4-5, C6-7, C7-T1, 02/2008. Continue current medication and alternate using heat and ice therapy. 04/14/2023 2.  Spondylosis of L-spine . Continue current treatment regime. 04/14/2023 3. Chronic Pain Syndrome Refilled: Xtampza 18 mg one capsule every 12 hours for pain #60 and Oxycodone 5/325mg  one tablet twice a day, may take a extra tablet when pain is sever # 75.  Continue Voltaren Gel/ Flector Patch.. We will continue the opioid monitoring program, this consists of regular clinic visits, examinations, urine drug screen, pill counts as well as use of [place] Controlled Substance Reporting system. A 12 month History has been reviewed on the [place] Controlled Substance Reporting System on 04/14/2023. 4. Diabetes with neuropathy. Continue current medication regimen with  Gabapentin. Keep blood sugars under tight control. Continue to monitor. 04/14/2023 5. Cervicalgia/Cervical Radiculopathy/ Thoracic Radiculopathy/ Lumbar Radiculopathy:Continue Gabapentin.04/14/2023 6. Right Greater Trochanter Bursitis: Continue Ice/Heat and Current medication regimen. 04/14/2023 7. Chronic Pain Syndrome/ Myofascial Pain: Continue Flector Patch/ Alternates with Voltaren Gel. 04/14/2023 8. Sacroiliac Pain: Continue HEP as Tolerated. Continue to Monitor.04/14/2023 9. Chronic Right Ankle Pain: .Continue HEP as tolerated. Continue to Monitor. 04/14/2023. 10. Post-Op Pain:  No complaints Today.  S/P LAPAROSCOPIC BILATERAL INGUINAL HERNIA REPAIR AND POSSIBLE APPENDECTOMY Dr Michaell Cowing Following.   11. Polyarthralgia: Continue HEP as Tolerated. Continue to monitor. 04/14/2023  12. Right Knee Pain: Continue HEP as Tolerated . Continue  current medication regimen. Continue to Monitor.  F/U in 1 month

## 2023-04-18 ENCOUNTER — Encounter: Payer: Self-pay | Admitting: Registered Nurse

## 2023-05-12 ENCOUNTER — Encounter: Payer: Self-pay | Admitting: Registered Nurse

## 2023-05-12 ENCOUNTER — Encounter
Payer: No Typology Code available for payment source | Attending: Physical Medicine & Rehabilitation | Admitting: Registered Nurse

## 2023-05-12 VITALS — BP 111/74 | HR 83 | Ht 75.0 in | Wt 226.0 lb

## 2023-05-12 DIAGNOSIS — G5793 Unspecified mononeuropathy of bilateral lower limbs: Secondary | ICD-10-CM | POA: Insufficient documentation

## 2023-05-12 DIAGNOSIS — M25571 Pain in right ankle and joints of right foot: Secondary | ICD-10-CM | POA: Diagnosis present

## 2023-05-12 DIAGNOSIS — M546 Pain in thoracic spine: Secondary | ICD-10-CM | POA: Insufficient documentation

## 2023-05-12 DIAGNOSIS — G894 Chronic pain syndrome: Secondary | ICD-10-CM | POA: Diagnosis present

## 2023-05-12 DIAGNOSIS — M47817 Spondylosis without myelopathy or radiculopathy, lumbosacral region: Secondary | ICD-10-CM | POA: Insufficient documentation

## 2023-05-12 DIAGNOSIS — M542 Cervicalgia: Secondary | ICD-10-CM | POA: Diagnosis present

## 2023-05-12 DIAGNOSIS — M5416 Radiculopathy, lumbar region: Secondary | ICD-10-CM | POA: Insufficient documentation

## 2023-05-12 DIAGNOSIS — M7061 Trochanteric bursitis, right hip: Secondary | ICD-10-CM | POA: Diagnosis present

## 2023-05-12 DIAGNOSIS — Z5181 Encounter for therapeutic drug level monitoring: Secondary | ICD-10-CM | POA: Diagnosis present

## 2023-05-12 DIAGNOSIS — Z79891 Long term (current) use of opiate analgesic: Secondary | ICD-10-CM | POA: Diagnosis present

## 2023-05-12 DIAGNOSIS — M5412 Radiculopathy, cervical region: Secondary | ICD-10-CM | POA: Insufficient documentation

## 2023-05-12 DIAGNOSIS — G8929 Other chronic pain: Secondary | ICD-10-CM | POA: Diagnosis present

## 2023-05-12 MED ORDER — OXYCODONE-ACETAMINOPHEN 5-325 MG PO TABS: 1.0000 | ORAL_TABLET | Freq: Two times a day (BID) | ORAL | 0 refills | Status: DC

## 2023-05-12 MED ORDER — XTAMPZA ER 18 MG PO C12A: 1.0000 | EXTENDED_RELEASE_CAPSULE | Freq: Two times a day (BID) | ORAL | 0 refills | Status: DC

## 2023-05-12 NOTE — Progress Notes (Signed)
Subjective:    Patient ID: David [name], male    DOB: July 03, 1976, 47 y.o.   MRN: [id number]  HPI: David [name] is a 47 y.o. male who returns for follow up appointment for chronic pain and medication refill. He states his pain is located in his neck radiating into his bilateral shoulders L>R radiating into his left arm with tingling. He also reports mid- lower back pain radiating into his bilateral lower extremities  L>R and bilateral feet with tingling and burning. He states he has right ankle pain. He rates his pain 6. His current exercise regime is walking and performing stretching exercises.  [name] Morphine equivalent is 78.75 MME.   He is also prescribed Diazepam by Drusilla Kanner NP .We have discussed the black box warning of using opioids and benzodiazepines. I highlighted the dangers of using these drugs together and discussed the adverse events including respiratory suppression, overdose, cognitive impairment and importance of compliance with current regimen. We will continue to monitor and adjust as indicated.      Last UDS was Performed on 03/17/2023, it was consistent.    Pain Inventory Average Pain 5 Pain Right Now 6 My pain is intermittent, constant, sharp, burning, dull, stabbing, tingling, and aching  In the last 24 hours, has pain interfered with the following? General activity 5 Relation with others 5 Enjoyment of life 5 What TIME of day is your pain at its worst? morning , daytime, evening, and night Sleep (in general) Fair  Pain is worse with: walking, bending, sitting, inactivity, standing, and some activites Pain improves with: rest, heat/ice, therapy/exercise, pacing activities, medication, and TENS Relief from Meds: 5  Family History  Problem Relation Age of Onset   Arthritis Mother    Heart disease Mother    Diabetes Mother    Heart disease Father    Diabetes Father    Social History   Socioeconomic History   Marital status: Significant  Other    Spouse name: Not on file   Number of children: Not on file   Years of education: Not on file   Highest education level: Not on file  Occupational History   Not on file  Tobacco Use   Smoking status: Never   Smokeless tobacco: Never  Vaping Use   Vaping Use: Never used  Substance and Sexual Activity   Alcohol use: No   Drug use: Never   Sexual activity: Not on file  Other Topics Concern   Not on file  Social History Narrative   Not on file   Social Determinants of Health   Financial Resource Strain: Not on file  Food Insecurity: Not on file  Transportation Needs: Not on file  Physical Activity: Not on file  Stress: Not on file  Social Connections: Not on file   Past Surgical History:  Procedure Laterality Date   ANTERIOR CERVICAL DECOMP/DISCECTOMY FUSION  02-15-2008  @[facility]    C4--5,  C6--7, C7--T1   COLONOSCOPY     INGUINAL HERNIA REPAIR Bilateral 12/13/2020   Procedure: LAPAROSCOPIC BILATERAL INGUINAL HERNIA REPAIR AND POSSIBLE APPENDECTOMY;  Surgeon: [name], MD;  Location: [facility];  Service: General;  Laterality: Bilateral;  ERAS   Past Surgical History:  Procedure Laterality Date   ANTERIOR CERVICAL DECOMP/DISCECTOMY FUSION  02-15-2008  @[facility]    C4--5,  C6--7, C7--T1   COLONOSCOPY     INGUINAL HERNIA REPAIR Bilateral 12/13/2020   Procedure: LAPAROSCOPIC BILATERAL INGUINAL HERNIA REPAIR AND POSSIBLE APPENDECTOMY;  Surgeon: Michaell Cowing,  Viviann Spare, MD;  Location: [facility];  Service: General;  Laterality: Bilateral;  ERAS   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    Family history of adverse reaction to anesthesia    father--- ponv   GERD (gastroesophageal reflux disease)    occasional, watches diet   Greater trochanteric  bursitis of right hip    History of kidney stones    Hypertension    Inguinal hernia, bilateral    Lumbar spondylosis    Nocturia    Peripheral neuropathy    bilateral lower legs and a little of left arm   Postlaminectomy syndrome, cervical region    Primary localized osteoarthrosis, lower leg    Radiculopathy, multiple sites in spine    cervical , thoracic, lumbar   Type 2 diabetes mellitus (HCC)    followed by pcp---  (12-11-2020 checks blood sugar 3 times wkly,  fasting sugar--- 125 )   Wears glasses    BP 111/74   Pulse 83   Ht 6\' 3"  (1.905 m)   Wt 226 lb (102.5 kg)   SpO2 98%   BMI 28.25 kg/m   Opioid Risk Score:   Fall Risk Score:  `1  Depression screen [facility] 2/9     03/17/2023    3:18 PM 02/17/2023    3:15 PM 01/20/2023    3:14 PM 11/25/2022    3:00 PM 10/21/2022    3:29 PM 08/26/2022    3:14 PM 05/20/2022    3:10 PM  Depression screen [facility] 2/9  Decreased Interest 0 0 0 0 0 0 1  Down, Depressed, Hopeless 0 0 0 0 0 0 1  PHQ - 2 Score 0 0 0 0 0 0 2      Review of Systems  Musculoskeletal:  Positive for back pain and myalgias.  All other systems reviewed and are negative.     Objective:   Physical Exam Vitals and nursing note reviewed.  Constitutional:      Appearance: Normal appearance.  Neck:     Comments: Cervical Paraspinal Tenderness: C-5-C-6 Cardiovascular:     Rate and Rhythm: Normal rate and regular rhythm.     Pulses: Normal pulses.     Heart sounds: Normal heart sounds.  Pulmonary:     Effort: Pulmonary effort is normal.     Breath sounds: Normal breath sounds.  Musculoskeletal:     Cervical back: Normal range of motion and neck supple.     Comments: Normal Muscle Bulk and Muscle Testing Reveals:  Upper Extremities: Full ROM and Muscle Strength 5/5 Bilateral AC Joint Tenderness Thoracic and Lumbar Hypersensitivity Right Greater Trochanter Tenderness Lower Extremities: Full ROM and Muscle Strength 5/5 Arises from Chair with Ease Narrow  Based  Gait     Skin:    General: Skin is warm and dry.  Neurological:     Mental Status: He is alert and oriented to person, place, and time.  Psychiatric:        Mood and Affect: Mood normal.        Behavior: Behavior normal.         Assessment & Plan:  1. Cervical postlaminectomy syndrome, Hx of ACDF C4-5, C6-7, C7-T1, 02/2008. Continue current medication and alternate using heat and ice therapy. 05/12/2023  2. Spondylosis of L-spine . Continue current treatment regime. 05/12/2023 3. Chronic Pain Syndrome Refilled: Xtampza 18 mg one capsule every 12 hours for pain #60 and Oxycodone 5/325mg  one tablet twice a day, may take a extra tablet when pain is sever # 75.  Continue Voltaren Gel/ Flector Patch.. We will continue the opioid monitoring program, this consists of regular clinic visits, examinations, urine drug screen, pill counts as well as use of [place] Controlled Substance Reporting system. A 12 month History has been reviewed on the [place] Controlled Substance Reporting System on 05/12/2023. 4. Diabetes with neuropathy. Continue current medication regimen with  Gabapentin. Keep blood sugars under tight control. Continue to monitor. 05/12/2023 5. Cervicalgia/Cervical Radiculopathy/ Thoracic Radiculopathy/ Lumbar Radiculopathy:Continue Gabapentin.05/12/2023 6. Right Greater Trochanter Bursitis: Continue Ice/Heat and Current medication regimen. 05/12/2023 7. Chronic Pain Syndrome/ Myofascial Pain: Continue Flector Patch/ Alternates with Voltaren Gel. 05/12/2023 8. Sacroiliac Pain: Continue HEP as Tolerated. Continue to Monitor.0710/2024 9. Chronic Right Ankle Pain: .Continue HEP as tolerated. Continue to Monitor. 05/12/2023. 10. Post-Op Pain:  No complaints Today.  S/P LAPAROSCOPIC BILATERAL INGUINAL HERNIA REPAIR AND POSSIBLE APPENDECTOMY Dr Michaell Cowing Following.   11. Polyarthralgia: Continue HEP as Tolerated. Continue to monitor. 05/12/2023  12. Right Knee Pain: Continue  HEP as Tolerated . Continue current medication regimen. Continue to Monitor. 05/12/2023 F/U in 1 month

## 2023-05-14 ENCOUNTER — Encounter: Payer: No Typology Code available for payment source | Admitting: Registered Nurse

## 2023-05-19 ENCOUNTER — Encounter: Payer: No Typology Code available for payment source | Admitting: Registered Nurse

## 2023-06-15 NOTE — Progress Notes (Unsigned)
Subjective:    Patient ID: David [name], male    DOB: [birth year], 47 y.o.   MRN: [id number]  HPI: David [name] is a 47 y.o. male who returns for follow up appointment for chronic pain and medication refill. He states his pain is located in his neck radiating into his bilateral shoulders L>R, mid- lower back pain radiating into his bilateral lower extremities and right hip pain. He also reports  right ankle pain and bilateral feet with tingling and burning. He rates his pain 5. His current exercise regime is walking and performing stretching exercises.  David [name] Morphine equivalent is 78.75 MME.   He  is also prescribed Diazepam  by Drusilla Kanner  .We have discussed the black box warning of using opioids and benzodiazepines. I highlighted the dangers of using these drugs together and discussed the adverse events including respiratory suppression, overdose, cognitive impairment and importance of compliance with current regimen. We will continue to monitor and adjust as indicated.   Last UDS was Performed on 03/17/2023, it was consistent.       Pain Inventory Average Pain 5 Pain Right Now 5 My pain is intermittent, constant, sharp, burning, dull, stabbing, tingling, and aching  In the last 24 hours, has pain interfered with the following? General activity 6 Relation with others 5 Enjoyment of life 5 What TIME of day is your pain at its worst? morning , daytime, evening, and night Sleep (in general) Fair  Pain is worse with: walking, bending, sitting, inactivity, standing, unsure, and some activites Pain improves with: rest, heat/ice, therapy/exercise, pacing activities, medication, TENS, and injections Relief from Meds: 5  Family History  Problem Relation Age of Onset   Arthritis Mother    Heart disease Mother    Diabetes Mother    Heart disease Father    Diabetes Father    Social History   Socioeconomic History   Marital status: Significant Other    Spouse name: Not  on file   Number of children: Not on file   Years of education: Not on file   Highest education level: Not on file  Occupational History   Not on file  Tobacco Use   Smoking status: Never   Smokeless tobacco: Never  Vaping Use   Vaping status: Never Used  Substance and Sexual Activity   Alcohol use: No   Drug use: Never   Sexual activity: Not on file  Other Topics Concern   Not on file  Social History Narrative   Not on file   Social Determinants of Health   Financial Resource Strain: Not on file  Food Insecurity: Not on file  Transportation Needs: Not on file  Physical Activity: Not on file  Stress: Not on file  Social Connections: Not on file   Past Surgical History:  Procedure Laterality Date   ANTERIOR CERVICAL DECOMP/DISCECTOMY FUSION  02-15-2008  @[facility]    C4--5,  C6--7, C7--T1   COLONOSCOPY     INGUINAL HERNIA REPAIR Bilateral 12/13/2020   Procedure: LAPAROSCOPIC BILATERAL INGUINAL HERNIA REPAIR AND POSSIBLE APPENDECTOMY;  Surgeon: [name], MD;  Location: [facility];  Service: General;  Laterality: Bilateral;  ERAS   Past Surgical History:  Procedure Laterality Date   ANTERIOR CERVICAL DECOMP/DISCECTOMY FUSION  02-15-2008  @[facility]    C4--5,  C6--7, C7--T1   COLONOSCOPY     INGUINAL HERNIA REPAIR Bilateral 12/13/2020   Procedure: LAPAROSCOPIC BILATERAL INGUINAL HERNIA REPAIR AND POSSIBLE APPENDECTOMY;  Surgeon: [name], MD;  Location:  [facility];  Service: General;  Laterality: Bilateral;  ERAS   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    Family history of adverse reaction to anesthesia    father--- ponv   GERD (gastroesophageal reflux disease)    occasional, watches diet   Greater trochanteric bursitis of right hip     History of kidney stones    Hypertension    Inguinal hernia, bilateral    Lumbar spondylosis    Nocturia    Peripheral neuropathy    bilateral lower legs and a little of left arm   Postlaminectomy syndrome, cervical region    Primary localized osteoarthrosis, lower leg    Radiculopathy, multiple sites in spine    cervical , thoracic, lumbar   Type 2 diabetes mellitus (HCC)    followed by pcp---  (12-11-2020 checks blood sugar 3 times wkly,  fasting sugar--- 125 )   Wears glasses    There were no vitals taken for this visit.  Opioid Risk Score:   Fall Risk Score:  `1  Depression screen [facility] 2/9     03/17/2023    3:18 PM 02/17/2023    3:15 PM 01/20/2023    3:14 PM 11/25/2022    3:00 PM 10/21/2022    3:29 PM 08/26/2022    3:14 PM 05/20/2022    3:10 PM  Depression screen PHQ 2/9  Decreased Interest 0 0 0 0 0 0 1  Down, Depressed, Hopeless 0 0 0 0 0 0 1  PHQ - 2 Score 0 0 0 0 0 0 2    Review of Systems  Musculoskeletal:  Positive for back pain.       Pain in left shoulder, left arm, both hips down to ankles  All other systems reviewed and are negative.      Objective:   Physical Exam Vitals and nursing note reviewed.  Constitutional:      Appearance: Normal appearance.  Neck:     Comments: Cervical Paraspinal Tenderness: C-5-C-6  Cardiovascular:     Rate and Rhythm: Normal rate and regular rhythm.     Pulses: Normal pulses.     Heart sounds: Normal heart sounds.  Pulmonary:     Effort: Pulmonary effort is normal.     Breath sounds: Normal breath sounds.  Musculoskeletal:     Cervical back: Normal range of motion and neck supple.     Comments: Normal Muscle Bulk and Muscle Testing Reveals:  Upper Extremities: Full ROM and Muscle Strength 5/5 Bilateral AC Joint Tenderness Thoracic and Lumbar Hypersensitivity Right Greater Trochanter Tenderness Lower Extremities: Full ROM and Muscle Strength 5/5 Bilateral Lower Extremities Flexion Produces Pain into his Bilateral  Patella's Arises from Table with ease Narrow Based Gait     Skin:    General: Skin is warm and dry.  Neurological:     Mental Status: He is alert and oriented to person, place, and time.  Psychiatric:        Mood and Affect: Mood normal.        Behavior: Behavior normal.         Assessment & Plan:  1. Cervical postlaminectomy syndrome, Hx of ACDF C4-5, C6-7, C7-T1, 02/2008. Continue current medication and alternate using heat and ice therapy. 06/16/2023 2. Spondylosis  of L-spine . Continue current treatment regime. 06/16/2023 3. Chronic Pain Syndrome Refilled: Xtampza 18 mg one capsule every 12 hours for pain #60 and Oxycodone 5/325mg  one tablet twice a day, may take a extra tablet when pain is sever # 75.  Continue Voltaren Gel/ Flector Patch.. We will continue the opioid monitoring program, this consists of regular clinic visits, examinations, urine drug screen, pill counts as well as use of [place] Controlled Substance Reporting system. A 12 month History has been reviewed on the [place] Controlled Substance Reporting System on 06/16/2023. 4. Diabetes with neuropathy. Continue current medication regimen with  Gabapentin. Keep blood sugars under tight control. Continue to monitor. 06/16/2023 5. Cervicalgia/Cervical Radiculopathy/ Thoracic Radiculopathy/ Lumbar Radiculopathy:Continue Gabapentin.06/16/2023 6. Right Greater Trochanter Bursitis: Continue Ice/Heat and Current medication regimen. 06/16/2023 7. Chronic Pain Syndrome/ Myofascial Pain: Continue Flector Patch/ Alternates with Voltaren Gel. 06/16/2023 8. Sacroiliac Pain: Continue HEP as Tolerated. Continue to Monitor.0814/2024 9. Chronic Right Ankle Pain: .Continue HEP as tolerated. Continue to Monitor. 06/16/2023. 10. Post-Op Pain:  No complaints Today.  S/P LAPAROSCOPIC BILATERAL INGUINAL HERNIA REPAIR AND POSSIBLE APPENDECTOMY Dr Michaell Cowing Following.   11. Polyarthralgia: Continue HEP as Tolerated. Continue to  monitor. 06/16/2023  12. Bilateral  Knee Pain: Continue HEP as Tolerated . Continue current medication regimen. Continue to Monitor. 06/16/2023 F/U in 1 month

## 2023-06-16 ENCOUNTER — Encounter
Payer: No Typology Code available for payment source | Attending: Physical Medicine & Rehabilitation | Admitting: Registered Nurse

## 2023-06-16 ENCOUNTER — Encounter: Payer: Self-pay | Admitting: Registered Nurse

## 2023-06-16 VITALS — BP 116/76 | HR 84 | Ht 75.0 in | Wt 227.0 lb

## 2023-06-16 DIAGNOSIS — M542 Cervicalgia: Secondary | ICD-10-CM

## 2023-06-16 DIAGNOSIS — Z79891 Long term (current) use of opiate analgesic: Secondary | ICD-10-CM

## 2023-06-16 DIAGNOSIS — M25571 Pain in right ankle and joints of right foot: Secondary | ICD-10-CM | POA: Insufficient documentation

## 2023-06-16 DIAGNOSIS — M546 Pain in thoracic spine: Secondary | ICD-10-CM | POA: Diagnosis not present

## 2023-06-16 DIAGNOSIS — G8929 Other chronic pain: Secondary | ICD-10-CM

## 2023-06-16 DIAGNOSIS — M7061 Trochanteric bursitis, right hip: Secondary | ICD-10-CM

## 2023-06-16 DIAGNOSIS — G894 Chronic pain syndrome: Secondary | ICD-10-CM | POA: Diagnosis present

## 2023-06-16 DIAGNOSIS — M5412 Radiculopathy, cervical region: Secondary | ICD-10-CM | POA: Diagnosis present

## 2023-06-16 DIAGNOSIS — M47817 Spondylosis without myelopathy or radiculopathy, lumbosacral region: Secondary | ICD-10-CM | POA: Diagnosis present

## 2023-06-16 DIAGNOSIS — G5793 Unspecified mononeuropathy of bilateral lower limbs: Secondary | ICD-10-CM

## 2023-06-16 DIAGNOSIS — Z5181 Encounter for therapeutic drug level monitoring: Secondary | ICD-10-CM

## 2023-06-16 DIAGNOSIS — M5416 Radiculopathy, lumbar region: Secondary | ICD-10-CM

## 2023-06-16 MED ORDER — OXYCODONE-ACETAMINOPHEN 5-325 MG PO TABS: 1.0000 | ORAL_TABLET | Freq: Two times a day (BID) | ORAL | 0 refills | Status: DC

## 2023-06-16 MED ORDER — XTAMPZA ER 18 MG PO C12A: 1.0000 | EXTENDED_RELEASE_CAPSULE | Freq: Two times a day (BID) | ORAL | 0 refills | Status: DC

## 2023-07-12 ENCOUNTER — Telehealth: Payer: Self-pay | Admitting: Registered Nurse

## 2023-07-12 MED ORDER — XTAMPZA ER 18 MG PO C12A: 1.0000 | EXTENDED_RELEASE_CAPSULE | Freq: Two times a day (BID) | ORAL | 0 refills | Status: DC

## 2023-07-12 NOTE — Telephone Encounter (Signed)
PMP Reviewed.  Xtampza prescription sent to Pharmacy.  David [name] is aware via My-Chart

## 2023-07-19 ENCOUNTER — Telehealth: Payer: Self-pay | Admitting: *Deleted

## 2023-07-19 NOTE — Telephone Encounter (Signed)
Prior auth for Xtampza 18 mg ER #[id number] submitted to [employer] via CoverMyMeds.

## 2023-07-19 NOTE — Telephone Encounter (Signed)
Xtampza ER was denied. He will need to be switched to a med on their formulary (listed in the denial letter).

## 2023-07-20 ENCOUNTER — Telehealth: Payer: Self-pay | Admitting: Registered Nurse

## 2023-07-20 MED ORDER — OXYCODONE ER 18 MG PO C12A: 1.0000 | EXTENDED_RELEASE_CAPSULE | Freq: Two times a day (BID) | ORAL | 0 refills | Status: DC

## 2023-07-20 NOTE — Telephone Encounter (Signed)
PMP was Reviewed.  Xtampza not covered by [employer]  Oxycodone ER Ordrerd. David [name] is aware via My-Chart message.

## 2023-07-21 ENCOUNTER — Encounter
Payer: No Typology Code available for payment source | Attending: Physical Medicine & Rehabilitation | Admitting: Registered Nurse

## 2023-07-21 ENCOUNTER — Encounter: Payer: Self-pay | Admitting: Registered Nurse

## 2023-07-21 VITALS — BP 116/77 | HR 81 | Ht 75.0 in | Wt 230.0 lb

## 2023-07-21 DIAGNOSIS — M47817 Spondylosis without myelopathy or radiculopathy, lumbosacral region: Secondary | ICD-10-CM | POA: Insufficient documentation

## 2023-07-21 DIAGNOSIS — G8929 Other chronic pain: Secondary | ICD-10-CM | POA: Diagnosis present

## 2023-07-21 DIAGNOSIS — G894 Chronic pain syndrome: Secondary | ICD-10-CM | POA: Insufficient documentation

## 2023-07-21 DIAGNOSIS — Z5181 Encounter for therapeutic drug level monitoring: Secondary | ICD-10-CM | POA: Diagnosis present

## 2023-07-21 DIAGNOSIS — Z79891 Long term (current) use of opiate analgesic: Secondary | ICD-10-CM | POA: Insufficient documentation

## 2023-07-21 DIAGNOSIS — M546 Pain in thoracic spine: Secondary | ICD-10-CM | POA: Insufficient documentation

## 2023-07-21 DIAGNOSIS — M542 Cervicalgia: Secondary | ICD-10-CM | POA: Insufficient documentation

## 2023-07-21 DIAGNOSIS — G5793 Unspecified mononeuropathy of bilateral lower limbs: Secondary | ICD-10-CM | POA: Diagnosis present

## 2023-07-21 DIAGNOSIS — M25571 Pain in right ankle and joints of right foot: Secondary | ICD-10-CM | POA: Insufficient documentation

## 2023-07-21 DIAGNOSIS — M5416 Radiculopathy, lumbar region: Secondary | ICD-10-CM | POA: Diagnosis present

## 2023-07-21 DIAGNOSIS — M7061 Trochanteric bursitis, right hip: Secondary | ICD-10-CM | POA: Insufficient documentation

## 2023-07-21 DIAGNOSIS — M5412 Radiculopathy, cervical region: Secondary | ICD-10-CM | POA: Diagnosis not present

## 2023-07-21 MED ORDER — OXYCODONE-ACETAMINOPHEN 5-325 MG PO TABS: 1.0000 | ORAL_TABLET | Freq: Two times a day (BID) | ORAL | 0 refills | Status: DC

## 2023-07-21 NOTE — Progress Notes (Signed)
Subjective:    Patient ID: David David [name], male    DOB: 07-22-1976, 47 y.o.   MRN: [id number]  David David [name] is a 47 y.o. male who returns for follow up appointment for chronic pain and medication refill. He states his pain is located in his neck radiating into his bilateral shoulders, left lower extremity, left pinky, left ring finger and left thumb ,mid- lower back pain radiating into his bilateral lower extremities. He rates his pain 5.His current exercise regime is walking and performing stretching exercises.  David David [name] denies his David David [name], reporting it's not on their formulary. He has been on this same medication for years with the same David [name]. Xtampza discontinued and Oxycodone ER prescribed, he verbalizes understanding.   David David [name] Morphine equivalent is 76.43 MME.   Last UDS was Performed on 03/17/2023 it was consistent.      Pain Inventory Average Pain 5 Pain Right Now 5 My pain is intermittent, constant, sharp, burning, dull, stabbing, tingling, and aching  In the last 24 hours, has pain interfered with the following? General activity 6 Relation with others 5 Enjoyment of life 5 What TIME of day is your pain at its worst? morning , daytime, evening, and night Sleep (in general) Fair  Pain is worse with: walking, bending, sitting, inactivity, standing, and some activites Pain improves with: rest, heat/ice, therapy/exercise, pacing activities, medication, and TENS Relief from Meds: 5  Family History  Problem Relation Age of Onset   Arthritis Mother    Heart disease Mother    Diabetes Mother    Heart disease Father    Diabetes Father    Social History   Socioeconomic History   Marital status: Significant Other    Spouse name: Not on file   Number of children: Not on file   Years of education: Not on file   Highest education level: Not on file  Occupational History   Not on file  Tobacco Use   Smoking status: Never   Smokeless tobacco:  Never  Vaping Use   Vaping status: Never Used  Substance and Sexual Activity   Alcohol use: No   Drug use: Never   Sexual activity: Not on file  Other Topics Concern   Not on file  Social History Narrative   Not on file   Social Determinants of Health   Financial Resource Strain: Not on file  Food Insecurity: Not on file  Transportation Needs: Not on file  Physical Activity: Not on file  Stress: Not on file  Social Connections: Not on file   Past Surgical History:  Procedure Laterality Date   ANTERIOR CERVICAL DECOMP/DISCECTOMY FUSION  02-15-2008  @[facility]    C4--5,  C6--7, C7--T1   COLONOSCOPY     INGUINAL HERNIA REPAIR Bilateral 12/13/2020   Procedure: LAPAROSCOPIC BILATERAL INGUINAL HERNIA REPAIR AND POSSIBLE APPENDECTOMY;  Surgeon: [name], MD;  Location: [facility];  Service: General;  Laterality: Bilateral;  ERAS   Past Surgical History:  Procedure Laterality Date   ANTERIOR CERVICAL DECOMP/DISCECTOMY FUSION  02-15-2008  @[facility]    C4--5,  C6--7, C7--T1   COLONOSCOPY     INGUINAL HERNIA REPAIR Bilateral 12/13/2020   Procedure: LAPAROSCOPIC BILATERAL INGUINAL HERNIA REPAIR AND POSSIBLE APPENDECTOMY;  Surgeon: [name], MD;  Location:  S[facility];  Service: General;  Laterality: Bilateral;  ERAS   Past Medical History:  Diagnosis Date   Cervicalgia    Chronic pain syndrome    pain clinic   Complication of  anesthesia    post op urinary retention after ACDF surgery 04/ 2009   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    Family history of adverse reaction to anesthesia    father--- ponv   GERD (gastroesophageal reflux disease)    occasional, watches diet   Greater trochanteric bursitis of right hip    History of kidney stones    Hypertension    Inguinal hernia, bilateral    Lumbar spondylosis    Nocturia    Peripheral neuropathy    bilateral lower legs and a little of left arm   Postlaminectomy syndrome,  cervical region    Primary localized osteoarthrosis, lower leg    Radiculopathy, multiple sites in spine    cervical , thoracic, lumbar   Type 2 diabetes mellitus (HCC)    followed by pcp---  (12-11-2020 checks blood sugar 3 times wkly,  fasting sugar--- 125 )   Wears glasses    There were no vitals taken for this visit.  Opioid Risk Score:   Fall Risk Score:  `1  Depression screen PHQ 2/9     06/16/2023    3:12 PM 03/17/2023    3:18 PM 02/17/2023    3:15 PM 01/20/2023    3:14 PM 11/25/2022    3:00 PM 10/21/2022    3:29 PM 08/26/2022    3:14 PM  Depression screen PHQ 2/9  Decreased Interest 0 0 0 0 0 0 0  Down, Depressed, Hopeless 0 0 0 0 0 0 0  PHQ - 2 Score 0 0 0 0 0 0 0    Review of Systems  Musculoskeletal:  Positive for back pain and neck pain. Negative for gait problem.       Pain in both legs, left arm  All other systems reviewed and are negative.      Objective:   Physical Exam Vitals and nursing note reviewed.  Constitutional:      Appearance: Normal appearance.  Neck:     Comments: Cervical Paraspinal Tenderness: C-5-C-6 Cardiovascular:     Rate and Rhythm: Normal rate and regular rhythm.     Pulses: Normal pulses.     Heart sounds: Normal heart sounds.  Pulmonary:     Effort: Pulmonary effort is normal.     Breath sounds: Normal breath sounds.  Musculoskeletal:     Cervical back: Normal range of motion and neck supple.     Comments: Normal Muscle Bulk and Muscle Testing Reveals:  Upper Extremities: Full ROM and Muscle Strengt 5/5 Bilateral AC Joint Tenderness  Thoracic and Lumbar Hypersensitivity Right Greater Trochanter tenderness Lower Extremities: Full ROM and Muscle Strength 5/5 Arises from Chair with ease Narrow Based  Gait     Skin:    General: Skin is warm and dry.  Neurological:     Mental Status: He is alert and oriented to person, place, and time.  Psychiatric:        Mood and Affect: Mood normal.        Behavior: Behavior normal.          Assessment & Plan:  1. Cervical postlaminectomy syndrome, Hx of ACDF C4-5, C6-7, C7-T1, 02/2008. Continue current medication and alternate using heat and ice therapy. 07/21/2023 2. Spondylosis of L-spine . Continue current treatment regime. 07/21/2023 3. Chronic Pain Syndrome Discontinued: David [name] will not cover  Xtampza 18 mg one capsule every 12 hours for pain #60 RX: Oxycodone ER 18 mg one capsule every 12 hours #60 and Oxycodone 5/325mg  one tablet  twice a day, may take a extra tablet when pain is sever # 75.  Continue Voltaren Gel/ Flector Patch.. We will continue the opioid monitoring program, this consists of regular clinic visits, examinations, urine drug screen, pill counts as well as use of [place] Controlled Substance Reporting system. A 12 month History has been reviewed on the [place] Controlled Substance Reporting System on 07/21/2023. 4. Diabetes with neuropathy. Continue current medication regimen with  Gabapentin. Keep blood sugars under tight control. Continue to monitor. 07/21/2023 5. Cervicalgia/Cervical Radiculopathy/ Thoracic Radiculopathy/ Lumbar Radiculopathy:Continue Gabapentin.07/21/2023 6. Right Greater Trochanter Bursitis: Continue Ice/Heat and Current medication regimen. 07/21/2023 7. Chronic Pain Syndrome/ Myofascial Pain: Continue Flector Patch/ Alternates with Voltaren Gel. 07/21/2023 8. Sacroiliac Pain: Continue HEP as Tolerated. Continue to Monitor.0918/2024 9. Chronic Right Ankle Pain: .Continue HEP as tolerated. Continue to Monitor. 07/21/2023. 10. Post-Op Pain:  No complaints Today.  S/P LAPAROSCOPIC BILATERAL INGUINAL HERNIA REPAIR AND POSSIBLE APPENDECTOMY Dr Michaell Cowing Following.   11. Polyarthralgia: Continue HEP as Tolerated. Continue to monitor. 07/21/2023  12. Bilateral  Knee Pain: Continue HEP as Tolerated . Continue current medication regimen. Continue to Monitor. 07/21/2023   F/U in 1 month

## 2023-07-21 NOTE — Telephone Encounter (Signed)
PA Submitted [facility] [facility] (Key: [id number])

## 2023-07-22 ENCOUNTER — Telehealth: Payer: Self-pay | Admitting: *Deleted

## 2023-07-22 NOTE — Telephone Encounter (Signed)
Per Riley Lam medication has been changed to extended release Oxycodone Xtampza [place] denied. Your plan only covers this drug when you meet one of these options: A) You have tried other drugs your plan covers (preferred drugs), and they did not work well for you, or B) Your doctor gives [place] a medical reason you cannot take those other drugs. For your plan, you may need to try up to three preferred drugs. We have denied your request because you do not meet any of these conditions. We reviewed the information we had. Your request has been denied. Your doctor can send [place] any new or missing information for [place] to review. The preferred drugs for your plan are: fentanyl transdermal, hydrocodone ext-rel, hydromorphone ext-rel, methadone, morphine ext-rel, oxycodone ext-rel. (Requirement: 3 in a class with 3 or more alternatives, 2 in a class with 2 alternatives, or 1 in a class with only 1 alternative.). Your doctor may need to get approval from your plan for preferred drugs. For this drug, you may have to meet other criteria. You can request the drug policy for more details. You can also request other plan documents for your review

## 2023-08-18 ENCOUNTER — Encounter
Payer: No Typology Code available for payment source | Attending: Physical Medicine & Rehabilitation | Admitting: Registered Nurse

## 2023-08-18 ENCOUNTER — Encounter: Payer: Self-pay | Admitting: Registered Nurse

## 2023-08-18 VITALS — BP 110/70 | HR 75 | Ht 75.0 in | Wt 231.0 lb

## 2023-08-18 DIAGNOSIS — Z5181 Encounter for therapeutic drug level monitoring: Secondary | ICD-10-CM | POA: Insufficient documentation

## 2023-08-18 DIAGNOSIS — M5412 Radiculopathy, cervical region: Secondary | ICD-10-CM | POA: Insufficient documentation

## 2023-08-18 DIAGNOSIS — G8929 Other chronic pain: Secondary | ICD-10-CM | POA: Diagnosis present

## 2023-08-18 DIAGNOSIS — M546 Pain in thoracic spine: Secondary | ICD-10-CM | POA: Diagnosis present

## 2023-08-18 DIAGNOSIS — M7061 Trochanteric bursitis, right hip: Secondary | ICD-10-CM | POA: Insufficient documentation

## 2023-08-18 DIAGNOSIS — M47817 Spondylosis without myelopathy or radiculopathy, lumbosacral region: Secondary | ICD-10-CM | POA: Diagnosis present

## 2023-08-18 DIAGNOSIS — G894 Chronic pain syndrome: Secondary | ICD-10-CM | POA: Diagnosis not present

## 2023-08-18 DIAGNOSIS — M5416 Radiculopathy, lumbar region: Secondary | ICD-10-CM | POA: Diagnosis present

## 2023-08-18 DIAGNOSIS — M542 Cervicalgia: Secondary | ICD-10-CM | POA: Diagnosis present

## 2023-08-18 DIAGNOSIS — M2550 Pain in unspecified joint: Secondary | ICD-10-CM | POA: Diagnosis present

## 2023-08-18 DIAGNOSIS — Z79891 Long term (current) use of opiate analgesic: Secondary | ICD-10-CM | POA: Diagnosis not present

## 2023-08-18 MED ORDER — OXYCODONE ER 18 MG PO C12A: 1.0000 | EXTENDED_RELEASE_CAPSULE | Freq: Two times a day (BID) | ORAL | 0 refills | Status: DC

## 2023-08-18 MED ORDER — OXYCODONE-ACETAMINOPHEN 5-325 MG PO TABS: 1.0000 | ORAL_TABLET | Freq: Two times a day (BID) | ORAL | 0 refills | Status: DC

## 2023-08-18 NOTE — Progress Notes (Signed)
Subjective:    Patient ID: David [name], male    DOB: 1976/05/05, 47 y.o.   MRN: [id number]  HPI: David [name] is a 47 y.o. male who returns for follow up appointment for chronic pain and medication refill. He states his pain is located in his neck radiating into her bilateral shoulders L>R and left arm with tingling and burning. He also reports mid- lower back pain radiating into his bilateral lower extremities, right hip and and right ankle pain. He also reports generalized joint pain. He rates his pain 5. His current exercise regime is walking and performing stretching exercises.  David [name] Morphine equivalent is 78.75 MME.He is also prescribed Diazepam  by  Drusilla Kanner NP .We have discussed the black box warning of using opioids and benzodiazepines. I highlighted the dangers of using these drugs together and discussed the adverse events including respiratory suppression, overdose, cognitive impairment and importance of compliance with current regimen. We will continue to monitor and adjust as indicated.   UDS ordered today.     Pain Inventory Average Pain 5 Pain Right Now 5 My pain is intermittent, constant, sharp, burning, dull, stabbing, tingling, and aching  In the last 24 hours, has pain interfered with the following? General activity 5 Relation with others 5 Enjoyment of life 6 What TIME of day is your pain at its worst? morning , daytime, evening, and night Sleep (in general) Fair  Pain is worse with: walking, bending, sitting, inactivity, standing, and some activites Pain improves with: rest, heat/ice, pacing activities, medication, and TENS Relief from Meds: 5  Family History  Problem Relation Age of Onset   Arthritis Mother    Heart disease Mother    Diabetes Mother    Heart disease Father    Diabetes Father    Social History   Socioeconomic History   Marital status: Significant Other    Spouse name: Not on file   Number of children: Not on file    Years of education: Not on file   Highest education level: Not on file  Occupational History   Not on file  Tobacco Use   Smoking status: Never   Smokeless tobacco: Never  Vaping Use   Vaping status: Never Used  Substance and Sexual Activity   Alcohol use: No   Drug use: Never   Sexual activity: Not on file  Other Topics Concern   Not on file  Social History Narrative   Not on file   Social Determinants of Health   Financial Resource Strain: Not on file  Food Insecurity: Not on file  Transportation Needs: Not on file  Physical Activity: Not on file  Stress: Not on file  Social Connections: Not on file   Past Surgical History:  Procedure Laterality Date   ANTERIOR CERVICAL DECOMP/DISCECTOMY FUSION  02-15-2008  @[facility]    C4--5,  C6--7, C7--T1   COLONOSCOPY     INGUINAL HERNIA REPAIR Bilateral 12/13/2020   Procedure: LAPAROSCOPIC BILATERAL INGUINAL HERNIA REPAIR AND POSSIBLE APPENDECTOMY;  Surgeon: [name], MD;  Location: [facility];  Service: General;  Laterality: Bilateral;  ERAS   Past Surgical History:  Procedure Laterality Date   ANTERIOR CERVICAL DECOMP/DISCECTOMY FUSION  02-15-2008  @[facility]    C4--5,  C6--7, C7--T1   COLONOSCOPY     INGUINAL HERNIA REPAIR Bilateral 12/13/2020   Procedure: LAPAROSCOPIC BILATERAL INGUINAL HERNIA REPAIR AND POSSIBLE APPENDECTOMY;  Surgeon: [name], MD;  Location: [facility];  Service: General;  Laterality:  Bilateral;  ERAS   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    Family history of adverse reaction to anesthesia    father--- ponv   GERD (gastroesophageal reflux disease)    occasional, watches diet   Greater trochanteric bursitis of right hip    History of kidney stones    Hypertension     Inguinal hernia, bilateral    Lumbar spondylosis    Nocturia    Peripheral neuropathy    bilateral lower legs and a little of left arm   Postlaminectomy syndrome, cervical region    Primary localized osteoarthrosis, lower leg    Radiculopathy, multiple sites in spine    cervical , thoracic, lumbar   Type 2 diabetes mellitus (HCC)    followed by pcp---  (12-11-2020 checks blood sugar 3 times wkly,  fasting sugar--- 125 )   Wears glasses    There were no vitals taken for this visit.  Opioid Risk Score:   Fall Risk Score:  `1  Depression screen PHQ 2/9     07/21/2023    3:12 PM 06/16/2023    3:12 PM 03/17/2023    3:18 PM 02/17/2023    3:15 PM 01/20/2023    3:14 PM 11/25/2022    3:00 PM 10/21/2022    3:29 PM  Depression screen PHQ 2/9  Decreased Interest 0 0 0 0 0 0 0  Down, Depressed, Hopeless 0 0 0 0 0 0 0  PHQ - 2 Score 0 0 0 0 0 0 0    Review of Systems  Musculoskeletal:  Positive for back pain.       Bilateral leg pain Left arm pain  All other systems reviewed and are negative.     Objective:   Physical Exam Vitals and nursing note reviewed.  Constitutional:      Appearance: Normal appearance.  Neck:     Comments: Cervical Paraspinal Tenderness: C--5-C-6 Cardiovascular:     Rate and Rhythm: Normal rate and regular rhythm.     Pulses: Normal pulses.     Heart sounds: Normal heart sounds.  Pulmonary:     Effort: Pulmonary effort is normal.     Breath sounds: Normal breath sounds.  Musculoskeletal:     Comments: Normal Muscle Bulk and Muscle Testing Reveals:  Upper Extremities: Full ROM and Muscle Strength 5/5 Bilateral AC Joint Tenderness: L>R Thoracic and Lumbar Hypersensitivity Lower Extremities: Full ROM and Muscle Strength 5/5 Bilateral Lower Extremities Flexion Produces Pain into his Lumbar and Bilateral Lower extremities Arises from Table slowly Narrow Based :  Gait     Skin:    General: Skin is warm and dry.  Neurological:     Mental Status: He is  alert and oriented to person, place, and time.  Psychiatric:        Mood and Affect: Mood normal.        Behavior: Behavior normal.         Assessment & Plan:  1. Cervical postlaminectomy syndrome, Hx of ACDF C4-5, C6-7, C7-T1, 02/2008. Continue current medication and alternate using heat and ice therapy. 08/18/2023 2. Spondylosis of L-spine . Continue current treatment regimen and Continue HEP as Tolerated.08/18/2023 3. Chronic Pain Syndrome: Refilled:  Xtampza 18 mg one capsule every 12 hours for pain #  60 and Oxycodone 5/325mg  one tablet twice a day, may take a extra tablet when pain is sever # 75.  Continue Voltaren Gel/ Flector Patch.. We will continue the opioid monitoring program, this consists of regular clinic visits, examinations, urine drug screen, pill counts as well as use of [place] Controlled Substance Reporting system. A 12 month History has been reviewed on the [place] Controlled Substance Reporting System on 08/18/2023. 4. Diabetes with neuropathy. Continue current medication regimen with  Gabapentin. Keep blood sugars under tight control. Continue to monitor. 08/18/2023 5. Cervicalgia/Cervical Radiculopathy/ Thoracic Radiculopathy/ Lumbar Radiculopathy:Continue Gabapentin.08/18/2023 6. Right Greater Trochanter Bursitis: Continue Ice/Heat and Current medication regimen. 08/18/2023 7. Chronic Pain Syndrome/ Myofascial Pain: Continue Flector Patch/ Alternates with Voltaren Gel. 08/18/2023 8. Sacroiliac Pain: No complaints today. Continue HEP as Tolerated. Continue to Monitor.1016/2024 9. Chronic Right Ankle Pain: .Continue HEP as tolerated. Continue to Monitor. 08/18/2023. 10. Post-Op Pain:  No complaints Today.  S/P LAPAROSCOPIC BILATERAL INGUINAL HERNIA REPAIR AND POSSIBLE APPENDECTOMY Dr Michaell Cowing Following.   11. Polyarthralgia: Continue HEP as Tolerated. Continue to monitor. 08/18/2023  12. Bilateral  Knee Pain: No complaints today.Continue HEP as Tolerated .  Continue current medication regimen. Continue to Monitor.08/18/2023     F/U in 1 month

## 2023-08-21 LAB — TOXASSURE SELECT,+ANTIDEPR,UR

## 2023-09-14 ENCOUNTER — Encounter
Payer: No Typology Code available for payment source | Attending: Physical Medicine & Rehabilitation | Admitting: Registered Nurse

## 2023-09-14 ENCOUNTER — Encounter: Payer: Self-pay | Admitting: Registered Nurse

## 2023-09-14 VITALS — BP 126/76 | HR 88 | Ht 75.0 in | Wt 234.0 lb

## 2023-09-14 DIAGNOSIS — G8929 Other chronic pain: Secondary | ICD-10-CM | POA: Insufficient documentation

## 2023-09-14 DIAGNOSIS — M7061 Trochanteric bursitis, right hip: Secondary | ICD-10-CM | POA: Insufficient documentation

## 2023-09-14 DIAGNOSIS — M546 Pain in thoracic spine: Secondary | ICD-10-CM | POA: Insufficient documentation

## 2023-09-14 DIAGNOSIS — M542 Cervicalgia: Secondary | ICD-10-CM | POA: Insufficient documentation

## 2023-09-14 DIAGNOSIS — M5416 Radiculopathy, lumbar region: Secondary | ICD-10-CM | POA: Diagnosis present

## 2023-09-14 DIAGNOSIS — M5412 Radiculopathy, cervical region: Secondary | ICD-10-CM | POA: Diagnosis not present

## 2023-09-14 DIAGNOSIS — M2550 Pain in unspecified joint: Secondary | ICD-10-CM | POA: Insufficient documentation

## 2023-09-14 DIAGNOSIS — M25561 Pain in right knee: Secondary | ICD-10-CM | POA: Insufficient documentation

## 2023-09-14 DIAGNOSIS — M47817 Spondylosis without myelopathy or radiculopathy, lumbosacral region: Secondary | ICD-10-CM | POA: Diagnosis not present

## 2023-09-14 DIAGNOSIS — M25571 Pain in right ankle and joints of right foot: Secondary | ICD-10-CM | POA: Diagnosis present

## 2023-09-14 MED ORDER — OXYCODONE-ACETAMINOPHEN 5-325 MG PO TABS: 1.0000 | ORAL_TABLET | Freq: Two times a day (BID) | ORAL | 0 refills | Status: DC

## 2023-09-14 MED ORDER — OXYCODONE ER 18 MG PO C12A: 1.0000 | EXTENDED_RELEASE_CAPSULE | Freq: Two times a day (BID) | ORAL | 0 refills | Status: DC

## 2023-09-14 NOTE — Progress Notes (Signed)
Subjective:    Patient ID: David [name], male    DOB: 1976-10-07, 47 y.o.   MRN: [id number]  HPI: David [name] is a 47 y.o. male who returns for follow up appointment for chronic pain and medication refill. He states his pain is located in his neck radiating into his bilateral shoulders L>R, left arm with tingling and numbness into his left ring finger, Left pinky and thumb. He also reports mid- back pain and lower back pain radiating into his bilateral lower extremities L>R He also reports right hip pain, Right knee and Right ankle pain.He  rates his pain 5. His current exercise regime is walking and performing stretching exercises.  David [name] Morphine equivalent is 82.50 MME. He is also prescribed Diazepam by Drusilla Kanner NP .We have discussed the black box warning of using opioids and benzodiazepines. I highlighted the dangers of using these drugs together and discussed the adverse events including respiratory suppression, overdose, cognitive impairment and importance of compliance with current regimen. We will continue to monitor and adjust as indicated.   Last UDS was Performed on 08/18/2023, it was consistent.     Pain Inventory Average Pain 5 Pain Right Now 5 My pain is intermittent, constant, sharp, burning, dull, stabbing, tingling, and aching  In the last 24 hours, has pain interfered with the following? General activity 6 Relation with others 5 Enjoyment of life 5 What TIME of day is your pain at its worst? morning , daytime, evening, and night Sleep (in general) Fair  Pain is worse with: walking, bending, sitting, inactivity, standing, and some activites Pain improves with: rest, heat/ice, therapy/exercise, pacing activities, medication, and TENS Relief from Meds: 5  Family History  Problem Relation Age of Onset   Arthritis Mother    Heart disease Mother    Diabetes Mother    Heart disease Father    Diabetes Father    Social History   Socioeconomic  History   Marital status: Significant Other    Spouse name: Not on file   Number of children: Not on file   Years of education: Not on file   Highest education level: Not on file  Occupational History   Not on file  Tobacco Use   Smoking status: Never   Smokeless tobacco: Never  Vaping Use   Vaping status: Never Used  Substance and Sexual Activity   Alcohol use: No   Drug use: Never   Sexual activity: Not on file  Other Topics Concern   Not on file  Social History Narrative   Not on file   Social Determinants of Health   Financial Resource Strain: Not on file  Food Insecurity: Not on file  Transportation Needs: Not on file  Physical Activity: Not on file  Stress: Not on file  Social Connections: Not on file   Past Surgical History:  Procedure Laterality Date   ANTERIOR CERVICAL DECOMP/DISCECTOMY FUSION  02-15-2008  @[facility]    C4--5,  C6--7, C7--T1   COLONOSCOPY     INGUINAL HERNIA REPAIR Bilateral 12/13/2020   Procedure: LAPAROSCOPIC BILATERAL INGUINAL HERNIA REPAIR AND POSSIBLE APPENDECTOMY;  Surgeon: [name], MD;  Location: [facility] [place];  Service: General;  Laterality: Bilateral;  ERAS   Past Surgical History:  Procedure Laterality Date   ANTERIOR CERVICAL DECOMP/DISCECTOMY FUSION  02-15-2008  @[facility]    C4--5,  C6--7, C7--T1   COLONOSCOPY     INGUINAL HERNIA REPAIR Bilateral 12/13/2020   Procedure: LAPAROSCOPIC BILATERAL INGUINAL HERNIA REPAIR AND  POSSIBLE APPENDECTOMY;  Surgeon: [name], MD;  Location: [facility];  Service: General;  Laterality: Bilateral;  ERAS   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    Family history of adverse reaction to anesthesia    father--- ponv   GERD (gastroesophageal reflux disease)    occasional,  watches diet   Greater trochanteric bursitis of right hip    History of kidney stones    Hypertension    Inguinal hernia, bilateral    Lumbar spondylosis    Nocturia    Peripheral neuropathy    bilateral lower legs and a little of left arm   Postlaminectomy syndrome, cervical region    Primary localized osteoarthrosis, lower leg    Radiculopathy, multiple sites in spine    cervical , thoracic, lumbar   Type 2 diabetes mellitus (HCC)    followed by pcp---  (12-11-2020 checks blood sugar 3 times wkly,  fasting sugar--- 125 )   Wears glasses    There were no vitals taken for this visit.  Opioid Risk Score:   Fall Risk Score:  `1  Depression screen PHQ 2/9     07/21/2023    3:12 PM 06/16/2023    3:12 PM 03/17/2023    3:18 PM 02/17/2023    3:15 PM 01/20/2023    3:14 PM 11/25/2022    3:00 PM 10/21/2022    3:29 PM  Depression screen PHQ 2/9  Decreased Interest 0 0 0 0 0 0 0  Down, Depressed, Hopeless 0 0 0 0 0 0 0  PHQ - 2 Score 0 0 0 0 0 0 0     Review of Systems  Musculoskeletal:  Positive for arthralgias, back pain, gait problem and neck pain.  All other systems reviewed and are negative.      Objective:   Physical Exam Vitals and nursing note reviewed.  Constitutional:      Appearance: Normal appearance. He is obese.  Cardiovascular:     Rate and Rhythm: Normal rate and regular rhythm.     Pulses: Normal pulses.     Heart sounds: Normal heart sounds.  Pulmonary:     Effort: Pulmonary effort is normal.     Breath sounds: Normal breath sounds.  Musculoskeletal:     Cervical back: Normal range of motion and neck supple.     Comments: Normal Muscle Bulk and Muscle Testing Reveals:  Upper Extremities: Full ROM and Muscle Strength 5/5 Bilateral AC Joint Tenderness  Thoracic and Lumbar Hypersensitivity Right Greater Trochanter Tenderness Lower Extremities: Full ROM and Muscle Strength 5/5 Arises from Table with ease Narrow Based  Gait     Skin:    General: Skin is  warm and dry.  Neurological:     Mental Status: He is alert and oriented to person, place, and time.  Psychiatric:        Mood and Affect: Mood normal.        Behavior: Behavior normal.          Assessment & Plan:  1. Cervical postlaminectomy syndrome, Hx of ACDF C4-5, C6-7, C7-T1, 02/2008. Continue current medication and alternate using heat and ice therapy. 09/14/2023 2. Spondylosis of L-spine . Continue current treatment regimen and Continue HEP as Tolerated.09/14/2023 3. Chronic Pain Syndrome:  Refilled:  Xtampza 18 mg one capsule every 12 hours for pain #60 and Oxycodone 5/325mg  one tablet twice a day, may take a extra tablet when pain is sever # 75.  Continue Voltaren Gel/ Flector Patch.. We will continue the opioid monitoring program, this consists of regular clinic visits, examinations, urine drug screen, pill counts as well as use of [place] Controlled Substance Reporting system. A 12 month History has been reviewed on the [place] Controlled Substance Reporting System on 09/14/2023. 4. Diabetes with neuropathy. Continue current medication regimen with  Gabapentin. Keep blood sugars under tight control. Continue to monitor. 09/14/2023 5. Cervicalgia/Cervical Radiculopathy/ Thoracic Radiculopathy/ Lumbar Radiculopathy:Continue Gabapentin.09/14/2023 6. Right Greater Trochanter Bursitis: Continue Ice/Heat and Current medication regimen. 09/14/2023 7. Chronic Pain Syndrome/ Myofascial Pain: Continue Flector Patch/ Alternates with Voltaren Gel. 09/14/2023 8. Sacroiliac Pain: No complaints today. Continue HEP as Tolerated. Continue to Monitor.09/14/2023 9. Chronic Right Ankle Pain: .Continue HEP as tolerated. Continue to Monitor. 09/14/2023. 10. Post-Op Pain:  No complaints Today.  S/P LAPAROSCOPIC BILATERAL INGUINAL HERNIA REPAIR AND POSSIBLE APPENDECTOMY Dr Michaell Cowing Following.   11. Polyarthralgia: Continue HEP as Tolerated. Continue to monitor. 09/14/2023  12. Chronic Right   Knee Pain: Continue HEP as Tolerated . Continue current medication regimen. Continue to Monitor.09/14/2023     F/U in 1 month

## 2023-10-13 ENCOUNTER — Encounter
Payer: No Typology Code available for payment source | Attending: Physical Medicine & Rehabilitation | Admitting: Registered Nurse

## 2023-10-13 ENCOUNTER — Encounter: Payer: Self-pay | Admitting: Registered Nurse

## 2023-10-13 VITALS — BP 121/80 | HR 86 | Ht 75.0 in | Wt 233.0 lb

## 2023-10-13 DIAGNOSIS — M7061 Trochanteric bursitis, right hip: Secondary | ICD-10-CM | POA: Insufficient documentation

## 2023-10-13 DIAGNOSIS — M542 Cervicalgia: Secondary | ICD-10-CM | POA: Insufficient documentation

## 2023-10-13 DIAGNOSIS — M25571 Pain in right ankle and joints of right foot: Secondary | ICD-10-CM | POA: Diagnosis present

## 2023-10-13 DIAGNOSIS — M2550 Pain in unspecified joint: Secondary | ICD-10-CM | POA: Diagnosis present

## 2023-10-13 DIAGNOSIS — M546 Pain in thoracic spine: Secondary | ICD-10-CM | POA: Insufficient documentation

## 2023-10-13 DIAGNOSIS — M47817 Spondylosis without myelopathy or radiculopathy, lumbosacral region: Secondary | ICD-10-CM | POA: Diagnosis not present

## 2023-10-13 DIAGNOSIS — M25561 Pain in right knee: Secondary | ICD-10-CM | POA: Insufficient documentation

## 2023-10-13 DIAGNOSIS — G8929 Other chronic pain: Secondary | ICD-10-CM | POA: Diagnosis present

## 2023-10-13 DIAGNOSIS — M5416 Radiculopathy, lumbar region: Secondary | ICD-10-CM | POA: Insufficient documentation

## 2023-10-13 DIAGNOSIS — M5412 Radiculopathy, cervical region: Secondary | ICD-10-CM | POA: Diagnosis present

## 2023-10-13 MED ORDER — OXYCODONE-ACETAMINOPHEN 5-325 MG PO TABS: 1.0000 | ORAL_TABLET | Freq: Two times a day (BID) | ORAL | 0 refills | Status: DC

## 2023-10-13 MED ORDER — OXYCODONE ER 18 MG PO C12A: 1.0000 | EXTENDED_RELEASE_CAPSULE | Freq: Two times a day (BID) | ORAL | 0 refills | Status: DC

## 2023-10-13 NOTE — Progress Notes (Signed)
Subjective:    Patient ID: David [name], male    DOB: [birth year], 47 y.o.   MRN: [id number]  HPI: David [name] is a 47 y.o. male who returns for follow up appointment for chronic pain and medication refill. He states his pain is located in his neck radiating into his bilateral shoulders, left arm with tingling, mid back he reports radiates outward and lower back pain radiating into his bilateral lower extremities. Also reports right hip pain, Right knee and right ankle pain. He rates his pain 5. His current exercise regime is walking and performing stretching exercises.  [name] Morphine equivalent is 82.50 MME.   Last UDS was Performed on 08/18/2023, it was consistent.    Pain Inventory Average Pain 5 Pain Right Now 5 My pain is intermittent, constant, sharp, burning, dull, stabbing, tingling, and aching  In the last 24 hours, has pain interfered with the following? General activity 6 Relation with others 5 Enjoyment of life 5 What TIME of day is your pain at its worst? morning , daytime, evening, and night Sleep (in general) Fair  Pain is worse with: walking, bending, sitting, inactivity, standing, and some activites Pain improves with: rest, heat/ice, therapy/exercise, pacing activities, medication, and TENS Relief from Meds: 5  Family History  Problem Relation Age of Onset  . Arthritis Mother   . Heart disease Mother   . Diabetes Mother   . Heart disease Father   . Diabetes Father    Social History   Socioeconomic History  . Marital status: Significant Other    Spouse name: Not on file  . Number of children: Not on file  . Years of education: Not on file  . Highest education level: Not on file  Occupational History  . Not on file  Tobacco Use  . Smoking status: Never  . Smokeless tobacco: Never  Vaping Use  . Vaping status: Never Used  Substance and Sexual Activity  . Alcohol use: No  . Drug use: Never  . Sexual activity: Not on file  Other Topics  Concern  . Not on file  Social History Narrative  . Not on file   Social Determinants of Health   Financial Resource Strain: Not on file  Food Insecurity: Not on file  Transportation Needs: Not on file  Physical Activity: Not on file  Stress: Not on file  Social Connections: Not on file   Past Surgical History:  Procedure Laterality Date  . ANTERIOR CERVICAL DECOMP/DISCECTOMY FUSION  02-15-2008  @[facility]    C4--5,  C6--7, C7--T1  . COLONOSCOPY    . INGUINAL HERNIA REPAIR Bilateral 12/13/2020   Procedure: LAPAROSCOPIC BILATERAL INGUINAL HERNIA REPAIR AND POSSIBLE APPENDECTOMY;  Surgeon: [name], MD;  Location: [facility] [place];  Service: General;  Laterality: Bilateral;  ERAS   Past Surgical History:  Procedure Laterality Date  . ANTERIOR CERVICAL DECOMP/DISCECTOMY FUSION  02-15-2008  @[facility]    C4--5,  C6--7, C7--T1  . COLONOSCOPY    . INGUINAL HERNIA REPAIR Bilateral 12/13/2020   Procedure: LAPAROSCOPIC BILATERAL INGUINAL HERNIA REPAIR AND POSSIBLE APPENDECTOMY;  Surgeon: [name], MD;  Location: [facility] [place];  Service: General;  Laterality: Bilateral;  ERAS   Past Medical History:  Diagnosis Date  . Cervicalgia   . Chronic pain syndrome    pain clinic  . Complication of anesthesia    post op urinary retention after ACDF surgery 04/ 2009  . 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 Family  history of adverse reaction to anesthesia    father--- ponv  . GERD (gastroesophageal reflux disease)    occasional, watches diet  . Greater trochanteric bursitis of right hip   . History of kidney stones   . Hypertension   . Inguinal hernia, bilateral   . Lumbar spondylosis   . Nocturia   . Peripheral neuropathy    bilateral lower legs and a little of left arm  . Postlaminectomy syndrome, cervical region   . Primary localized osteoarthrosis, lower leg   . Radiculopathy, multiple sites in spine    cervical , thoracic, lumbar  . Type  2 diabetes mellitus (HCC)    followed by pcp---  (12-11-2020 checks blood sugar 3 times wkly,  fasting sugar--- 125 )  . Wears glasses    Ht 6\' 3"  (1.905 m)   BMI 29.25 kg/m   Opioid Risk Score:   Fall Risk Score:  `1  Depression screen [facility] 2/9     10/13/2023    3:06 PM 09/14/2023    3:19 PM 07/21/2023    3:12 PM 06/16/2023    3:12 PM 03/17/2023    3:18 PM 02/17/2023    3:15 PM 01/20/2023    3:14 PM  Depression screen PHQ 2/9  Decreased Interest 0 0 0 0 0 0 0  Down, Depressed, Hopeless 0 0 0 0 0 0 0  PHQ - 2 Score 0 0 0 0 0 0 0      Review of Systems  Musculoskeletal:  Positive for back pain, gait problem and neck pain.       Left arm pain   All other systems reviewed and are negative.     Objective:   Physical Exam Vitals and nursing note reviewed.  Constitutional:      Appearance: Normal appearance.  Neck:     Comments: Cervical Paraspinal Tenderness: C-5-C-6  Cardiovascular:     Rate and Rhythm: Normal rate and regular rhythm.     Pulses: Normal pulses.     Heart sounds: Normal heart sounds.  Pulmonary:     Effort: Pulmonary effort is normal.     Breath sounds: Normal breath sounds.  Musculoskeletal:     Comments: Normal Muscle Bulk and Muscle Testing Reveals:  Upper Extremities: Full ROM and Muscle Strength 5/5 Bilateral AC Joint Tenderness  Thoracic and Lumbar Hypersensitivity Lower Extremities: Full ROM and Muscle Strength 5/5 Arises from Chair slowly Narrow Based  Gait     Skin:    General: Skin is warm and dry.  Neurological:     Mental Status: He is alert and oriented to person, place, and time.  Psychiatric:        Mood and Affect: Mood normal.        Behavior: Behavior normal.         Assessment & Plan:  1. Cervical postlaminectomy syndrome, Hx of ACDF C4-5, C6-7, C7-T1, 02/2008. Continue current medication and alternate using heat and ice therapy. 10/13/2023 2. Spondylosis of L-spine . Continue current treatment regimen and Continue HEP  as Tolerated.10/13/2023 3. Chronic Pain Syndrome: Refilled:  Xtampza 18 mg one capsule every 12 hours for pain #60 and Oxycodone 5/325mg  one tablet twice a day, may take a extra tablet when pain is sever # 75.  Continue Voltaren Gel/ Flector Patch.. We will continue the opioid monitoring program, this consists of regular clinic visits, examinations, urine drug screen, pill counts as well as use of [place] Controlled Substance Reporting system. A 12 month History  has been reviewed on the [place] Controlled Substance Reporting System on 10/13/2023. 4. Diabetes with neuropathy. Continue current medication regimen with  Gabapentin. Keep blood sugars under tight control. Continue to monitor. 10/13/2023 5. Cervicalgia/Cervical Radiculopathy/ Thoracic Radiculopathy/ Lumbar Radiculopathy:Continue Gabapentin.10/13/2023 6. Right Greater Trochanter Bursitis: Continue Ice/Heat and Current medication regimen. 10/13/2023 7. Chronic Pain Syndrome/ Myofascial Pain: Continue Flector Patch/ Alternates with Voltaren Gel. 10/13/2023 8. Sacroiliac Pain: No complaints today. Continue HEP as Tolerated. Continue to Monitor.09/14/2023 9. Chronic Right Ankle Pain: .Continue HEP as tolerated. Continue to Monitor. 10/13/2023. 10. Post-Op Pain:  No complaints Today.  S/P LAPAROSCOPIC BILATERAL INGUINAL HERNIA REPAIR AND POSSIBLE APPENDECTOMY Dr Michaell Cowing Following.   11. Polyarthralgia: Continue HEP as Tolerated. Continue to monitor. 10/13/2023  12. Chronic Right  Knee Pain: Continue HEP as Tolerated . Continue current medication regimen. Continue to Monitor.10/13/2023     F/U in 1 month

## 2023-10-20 ENCOUNTER — Encounter: Payer: No Typology Code available for payment source | Admitting: Registered Nurse

## 2023-11-10 ENCOUNTER — Encounter: Payer: Self-pay | Admitting: Registered Nurse

## 2023-11-10 ENCOUNTER — Encounter
Payer: No Typology Code available for payment source | Attending: Physical Medicine & Rehabilitation | Admitting: Registered Nurse

## 2023-11-10 VITALS — BP 100/65 | HR 82 | Ht 75.0 in | Wt 231.0 lb

## 2023-11-10 DIAGNOSIS — M546 Pain in thoracic spine: Secondary | ICD-10-CM | POA: Diagnosis present

## 2023-11-10 DIAGNOSIS — M5412 Radiculopathy, cervical region: Secondary | ICD-10-CM | POA: Insufficient documentation

## 2023-11-10 DIAGNOSIS — M5416 Radiculopathy, lumbar region: Secondary | ICD-10-CM | POA: Diagnosis present

## 2023-11-10 DIAGNOSIS — M2550 Pain in unspecified joint: Secondary | ICD-10-CM | POA: Diagnosis present

## 2023-11-10 DIAGNOSIS — M7061 Trochanteric bursitis, right hip: Secondary | ICD-10-CM | POA: Diagnosis present

## 2023-11-10 DIAGNOSIS — M25561 Pain in right knee: Secondary | ICD-10-CM | POA: Insufficient documentation

## 2023-11-10 DIAGNOSIS — M25571 Pain in right ankle and joints of right foot: Secondary | ICD-10-CM | POA: Insufficient documentation

## 2023-11-10 DIAGNOSIS — M47817 Spondylosis without myelopathy or radiculopathy, lumbosacral region: Secondary | ICD-10-CM | POA: Diagnosis not present

## 2023-11-10 DIAGNOSIS — M542 Cervicalgia: Secondary | ICD-10-CM | POA: Insufficient documentation

## 2023-11-10 DIAGNOSIS — M25562 Pain in left knee: Secondary | ICD-10-CM | POA: Insufficient documentation

## 2023-11-10 DIAGNOSIS — G8929 Other chronic pain: Secondary | ICD-10-CM | POA: Diagnosis present

## 2023-11-10 MED ORDER — OXYCODONE ER 18 MG PO C12A: 1.0000 | EXTENDED_RELEASE_CAPSULE | Freq: Two times a day (BID) | ORAL | 0 refills | Status: DC

## 2023-11-10 MED ORDER — OXYCODONE-ACETAMINOPHEN 5-325 MG PO TABS: 1.0000 | ORAL_TABLET | Freq: Two times a day (BID) | ORAL | 0 refills | Status: DC

## 2023-11-10 NOTE — Progress Notes (Addendum)
 Subjective:    Patient ID: David [name], male    DOB: November 21, 1975, 48 y.o.   MRN: [id number]  HPI: David [name] is a 48 y.o. male who returns for follow up appointment for chronic pain and medication refill. He states his  pain is located in his neck radiating into his bilateral shoulders, upper- lower back pain radiating into his bilateral lower extremities and right hip pain. He also reports  bilateral knee pain, right ankle and generalized joint pain. He rates his pain 5.His  current exercise regime is walking and performing stretching exercises.  Mr. Ellner Morphine equivalent is 78.75 MME.He is also prescribed Diazepam by Angeline Iba NP, he's using the diazepam sparingly.  .We have discussed the black box warning of using opioids and benzodiazepines. I highlighted the dangers of using these drugs together and discussed the adverse events including respiratory suppression, overdose, cognitive impairment and importance of compliance with current regimen. We will continue to monitor and adjust as indicated.     Last UDS was Performed on 08/18/2023, it was consistent.     Pain Inventory Average Pain 5 Pain Right Now 5 My pain is intermittent, constant, sharp, burning, dull, stabbing, tingling, and aching  In the last 24 hours, has pain interfered with the following? General activity 6 Relation with others 5 Enjoyment of life 5 What TIME of day is your pain at its worst? morning , daytime, evening, and night Sleep (in general) Fair  Pain is worse with: walking, bending, sitting, standing, and some activites Pain improves with: rest, heat/ice, pacing activities, medication, and TENS Relief from Meds: 5  Family History  Problem Relation Age of Onset   Arthritis Mother    Heart disease Mother    Diabetes Mother    Heart disease Father    Diabetes Father    Social History   Socioeconomic History   Marital status: Significant Other    Spouse name: Not on file   Number  of children: Not on file   Years of education: Not on file   Highest education level: Not on file  Occupational History   Not on file  Tobacco Use   Smoking status: Never   Smokeless tobacco: Never  Vaping Use   Vaping status: Never Used  Substance and Sexual Activity   Alcohol  use: No   Drug use: Never   Sexual activity: Not on file  Other Topics Concern   Not on file  Social History Narrative   Not on file   Social Drivers of Health   Financial Resource Strain: Not on file  Food Insecurity: Not on file  Transportation Needs: Not on file  Physical Activity: Not on file  Stress: Not on file  Social Connections: Not on file   Past Surgical History:  Procedure Laterality Date   ANTERIOR CERVICAL DECOMP/DISCECTOMY FUSION  02-15-2008  @[facility]    C4--5,  C6--7, C7--T1   COLONOSCOPY     INGUINAL HERNIA REPAIR Bilateral 12/13/2020   Procedure: LAPAROSCOPIC BILATERAL INGUINAL HERNIA REPAIR AND POSSIBLE APPENDECTOMY;  Surgeon: Sheldon Standing, MD;  Location: [facility];  Service: General;  Laterality: Bilateral;  ERAS   Past Surgical History:  Procedure Laterality Date   ANTERIOR CERVICAL DECOMP/DISCECTOMY FUSION  02-15-2008  @[facility]    C4--5,  C6--7, C7--T1   COLONOSCOPY     INGUINAL HERNIA REPAIR Bilateral 12/13/2020   Procedure: LAPAROSCOPIC BILATERAL INGUINAL HERNIA REPAIR AND POSSIBLE APPENDECTOMY;  Surgeon: Sheldon Standing, MD;  Location: [facility];  Service: General;  Laterality: Bilateral;  ERAS   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    Family history of adverse reaction to anesthesia    father--- ponv   GERD (gastroesophageal reflux disease)    occasional, watches diet   Greater trochanteric bursitis of right hip    History of kidney stones     Hypertension    Inguinal hernia, bilateral    Lumbar spondylosis    Nocturia    Peripheral neuropathy    bilateral lower legs and a little of left arm   Postlaminectomy syndrome, cervical region    Primary localized osteoarthrosis, lower leg    Radiculopathy, multiple sites in spine    cervical , thoracic, lumbar   Type 2 diabetes mellitus (HCC)    followed by pcp---  (12-11-2020 checks blood sugar 3 times wkly,  fasting sugar--- 125 )   Wears glasses    BP 100/65   Pulse 82   Ht 6' 3 (1.905 m)   Wt 231 lb (104.8 kg)   SpO2 97%   BMI 28.87 kg/m   Opioid Risk Score:   Fall Risk Score:  `1  Depression screen PHQ 2/9     11/10/2023    3:12 PM 10/13/2023    3:06 PM 09/14/2023    3:19 PM 07/21/2023    3:12 PM 06/16/2023    3:12 PM 03/17/2023    3:18 PM 02/17/2023    3:15 PM  Depression screen PHQ 2/9  Decreased Interest 0 0 0 0 0 0 0  Down, Depressed, Hopeless 0 0 0 0 0 0 0  PHQ - 2 Score 0 0 0 0 0 0 0     Review of Systems     Objective:   Physical Exam Vitals and nursing note reviewed.  Constitutional:      Appearance: Normal appearance.  Neck:     Comments: Cervical Paraspinal Tenderness: C-5-C-6  Cardiovascular:     Rate and Rhythm: Normal rate and regular rhythm.     Pulses: Normal pulses.     Heart sounds: Normal heart sounds.  Pulmonary:     Effort: Pulmonary effort is normal.     Breath sounds: Normal breath sounds.  Musculoskeletal:     Comments: Normal Muscle Bulk and Muscle Testing Reveals:  Upper Extremities: Full ROM and Muscle Strength 5/5 Bilateral AC Joint Tenderness  Thoracic and Lumbar Hypersensitivity Right Greater Trochanter Tenderness Lower Extremities: Full ROM and Muscle Strength 5/5 Arises from Table with ease Narrow Based  Gait     Skin:    General: Skin is warm and dry.  Neurological:     Mental Status: He is alert and oriented to person, place, and time.  Psychiatric:        Mood and Affect: Mood normal.        Behavior:  Behavior normal.         Assessment & Plan:  1. Cervical postlaminectomy syndrome, Hx of ACDF C4-5, C6-7, C7-T1, 02/2008. Continue current medication and alternate using heat and ice therapy. 11/10/2023 2. Spondylosis of L-spine . Continue current treatment regimen and Continue HEP as Tolerated.11/10/2023 3. Chronic Pain Syndrome: Refilled:  Xtampza  18 mg one capsule every 12 hours for pain #60 and Oxycodone  5/325mg  one tablet twice a  day, may take a extra tablet when pain is sever # 75.  Continue Voltaren  Gel/ Flector  Patch.. We will continue the opioid monitoring program, this consists of regular clinic visits, examinations, urine drug screen, pill counts as well as use of [place]  Controlled Substance Reporting system. A 12 month History has been reviewed on the [place]  Controlled Substance Reporting System on 11/10/2023. 4. Diabetes with neuropathy. Continue current medication regimen with  Gabapentin . Keep blood sugars under tight control. Continue to monitor. 11/10/2023 5. Cervicalgia/Cervical Radiculopathy/ Thoracic Radiculopathy/ Lumbar Radiculopathy:Continue Gabapentin .11/10/2023 6. Right Greater Trochanter Bursitis: Continue Ice/Heat and Current medication regimen. 11/10/2023 7. Chronic Pain Syndrome/ Myofascial Pain: Continue Flector  Patch/ Alternates with Voltaren  Gel. 11/10/2023 8. Sacroiliac Pain: No complaints today. Continue HEP as Tolerated. Continue to Monitor.11/10/2023 9. Chronic Right Ankle Pain: .Continue HEP as tolerated. Continue to Monitor. 11/10/2023. 10. Post-Op Pain:  No complaints Today.  S/P LAPAROSCOPIC BILATERAL INGUINAL HERNIA REPAIR AND POSSIBLE APPENDECTOMY Dr Sheldon Following.   11. Polyarthralgia: Continue HEP as Tolerated. Continue to monitor. 11/10/2023  12. Chronic Right  Knee Pain: Continue HEP as Tolerated . Continue current medication regimen. Continue to Monitor.11/10/2023     F/U in 1 month

## 2023-12-01 ENCOUNTER — Other Ambulatory Visit: Payer: Self-pay | Admitting: Registered Nurse

## 2023-12-15 ENCOUNTER — Encounter
Payer: No Typology Code available for payment source | Attending: Physical Medicine & Rehabilitation | Admitting: Registered Nurse

## 2023-12-15 VITALS — Ht 75.0 in | Wt 230.0 lb

## 2023-12-15 DIAGNOSIS — M5412 Radiculopathy, cervical region: Secondary | ICD-10-CM

## 2023-12-15 DIAGNOSIS — M5416 Radiculopathy, lumbar region: Secondary | ICD-10-CM | POA: Diagnosis present

## 2023-12-15 DIAGNOSIS — M546 Pain in thoracic spine: Secondary | ICD-10-CM | POA: Diagnosis present

## 2023-12-15 DIAGNOSIS — M7061 Trochanteric bursitis, right hip: Secondary | ICD-10-CM | POA: Diagnosis present

## 2023-12-15 DIAGNOSIS — Z5181 Encounter for therapeutic drug level monitoring: Secondary | ICD-10-CM

## 2023-12-15 DIAGNOSIS — M25571 Pain in right ankle and joints of right foot: Secondary | ICD-10-CM | POA: Insufficient documentation

## 2023-12-15 DIAGNOSIS — M25561 Pain in right knee: Secondary | ICD-10-CM | POA: Diagnosis present

## 2023-12-15 DIAGNOSIS — M47817 Spondylosis without myelopathy or radiculopathy, lumbosacral region: Secondary | ICD-10-CM | POA: Diagnosis present

## 2023-12-15 DIAGNOSIS — M2550 Pain in unspecified joint: Secondary | ICD-10-CM

## 2023-12-15 DIAGNOSIS — G8929 Other chronic pain: Secondary | ICD-10-CM

## 2023-12-15 DIAGNOSIS — Z79891 Long term (current) use of opiate analgesic: Secondary | ICD-10-CM

## 2023-12-15 DIAGNOSIS — M25562 Pain in left knee: Secondary | ICD-10-CM | POA: Insufficient documentation

## 2023-12-15 DIAGNOSIS — G894 Chronic pain syndrome: Secondary | ICD-10-CM | POA: Diagnosis present

## 2023-12-15 DIAGNOSIS — M542 Cervicalgia: Secondary | ICD-10-CM | POA: Diagnosis present

## 2023-12-15 MED ORDER — OXYCODONE ER 18 MG PO C12A: 1.0000 | EXTENDED_RELEASE_CAPSULE | Freq: Two times a day (BID) | ORAL | 0 refills | Status: DC

## 2023-12-15 MED ORDER — OXYCODONE-ACETAMINOPHEN 5-325 MG PO TABS: 1.0000 | ORAL_TABLET | Freq: Two times a day (BID) | ORAL | 0 refills | Status: DC

## 2023-12-15 NOTE — Progress Notes (Signed)
 Subjective:    Patient ID: David [name], male    DOB: 30-Apr-1976, 48 y.o.   MRN: [id number]  HPI: Barnell Shieh is a 48 y.o. male who returns for follow up appointment for chronic pain and medication refill. He states his pain is located in his neck radiating into her bilateral shoulders, mid- lower back radiating into her bilateral lower extremities, right hip pain and bilateral knee pain. She also reports right ankle pain and generalized joint pain. She rates her pain 5. Her current exercise regime is walking and performing stretching exercises.  Mr. Jacinto Morphine equivalent is 78.75 MME. He  is also prescribed Diazepam  by Ralph Dowdy NP .We have discussed the black box warning of using opioids and benzodiazepines. I highlighted the dangers of using these drugs together and discussed the adverse events including respiratory suppression, overdose, cognitive impairment and importance of compliance with current regimen. We will continue to monitor and adjust as indicated.    Oral Swab was Performed today.   Vitals: 117/75 P: 78 Sat 98%   Pain Inventory Average Pain 5 Pain Right Now 5 My pain is sharp, burning, dull, stabbing, tingling, and aching  In the last 24 hours, has pain interfered with the following? General activity 4 Relation with others 0 Enjoyment of life 0 What TIME of day is your pain at its worst? varies Sleep (in general) Fair  Pain is worse with: walking, bending, sitting, standing, and some activites Pain improves with: rest and medication Relief from Meds: 5  Family History  Problem Relation Age of Onset   Arthritis Mother    Heart disease Mother    Diabetes Mother    Heart disease Father    Diabetes Father    Social History   Socioeconomic History   Marital status: Significant Other    Spouse name: Not on file   Number of children: Not on file   Years of education: Not on file   Highest education level: Not on file  Occupational  History   Not on file  Tobacco Use   Smoking status: Never   Smokeless tobacco: Never  Vaping Use   Vaping status: Never Used  Substance and Sexual Activity   Alcohol use: No   Drug use: Never   Sexual activity: Not on file  Other Topics Concern   Not on file  Social History Narrative   Not on file   Social Drivers of Health   Financial Resource Strain: Not on file  Food Insecurity: Not on file  Transportation Needs: Not on file  Physical Activity: Not on file  Stress: Not on file  Social Connections: Not on file   Past Surgical History:  Procedure Laterality Date   ANTERIOR CERVICAL DECOMP/DISCECTOMY FUSION  02-15-2008  @[facility]    C4--5,  C6--7, C7--T1   COLONOSCOPY     INGUINAL HERNIA REPAIR Bilateral 12/13/2020   Procedure: LAPAROSCOPIC BILATERAL INGUINAL HERNIA REPAIR AND POSSIBLE APPENDECTOMY;  Surgeon: [name], MD;  Location: [facility];  Service: General;  Laterality: Bilateral;  ERAS   Past Surgical History:  Procedure Laterality Date   ANTERIOR CERVICAL DECOMP/DISCECTOMY FUSION  02-15-2008  @[facility]    C4--5,  C6--7, C7--T1   COLONOSCOPY     INGUINAL HERNIA REPAIR Bilateral 12/13/2020   Procedure: LAPAROSCOPIC BILATERAL INGUINAL HERNIA REPAIR AND POSSIBLE APPENDECTOMY;  Surgeon: [name], MD;  Location: [facility];  Service: General;  Laterality: Bilateral;  ERAS   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    Family history of adverse reaction to anesthesia    father--- ponv   GERD (gastroesophageal reflux disease)    occasional, watches diet   Greater trochanteric bursitis of right hip    History of kidney stones    Hypertension    Inguinal hernia, bilateral    Lumbar spondylosis    Nocturia    Peripheral neuropathy     bilateral lower legs and a little of left arm   Postlaminectomy syndrome, cervical region    Primary localized osteoarthrosis, lower leg    Radiculopathy, multiple sites in spine    cervical , thoracic, lumbar   Type 2 diabetes mellitus (HCC)    followed by pcp---  (12-11-2020 checks blood sugar 3 times wkly,  fasting sugar--- 125 )   Wears glasses    Ht 6\' 3"  (1.905 m)   Wt 230 lb (104.3 kg)   BMI 28.75 kg/m   Opioid Risk Score:   Fall Risk Score:  `1  Depression screen PHQ 2/9     12/15/2023    3:21 PM 11/10/2023    3:12 PM 10/13/2023    3:06 PM 09/14/2023    3:19 PM 07/21/2023    3:12 PM 06/16/2023    3:12 PM 03/17/2023    3:18 PM  Depression screen PHQ 2/9  Decreased Interest 0 0 0 0 0 0 0  Down, Depressed, Hopeless 0 0 0 0 0 0 0  PHQ - 2 Score 0 0 0 0 0 0 0      Review of Systems  Musculoskeletal:  Positive for back pain, gait problem and neck pain.  All other systems reviewed and are negative.     Objective:   Physical Exam Vitals and nursing note reviewed.  Constitutional:      Appearance: Normal appearance.  Neck:     Comments: Cervical Paraspinal Tenderness: C-5-C-6  Cardiovascular:     Rate and Rhythm: Normal rate and regular rhythm.     Pulses: Normal pulses.     Heart sounds: Normal heart sounds.  Pulmonary:     Effort: Pulmonary effort is normal.     Breath sounds: Normal breath sounds.  Musculoskeletal:     Comments: Normal Muscle Bulk and Muscle Testing Reveals:  Upper Extremities: Full ROM and Muscle Strength 5/5 Bilateral AC Joint Tenderness  Thoracic and Lumbar Hypersensitivity Right Greater Trochanter Tenderness Lower Extremities: Full ROM and Muscle Strength 5/5 Bilateral Lower Extremities Flexion Produces Pain into her Bilateral Patella's Arises from Chair slowly Narrow Based  Gait     Skin:    General: Skin is warm and dry.  Neurological:     Mental Status: He is alert and oriented to person, place, and time.  Psychiatric:         Mood and Affect: Mood normal.        Behavior: Behavior normal.         Assessment & Plan:  1. Cervical postlaminectomy syndrome, Hx of ACDF C4-5, C6-7, C7-T1, 02/2008. Continue current medication and alternate using heat and ice therapy. 12/15/2023 2. Spondylosis of L-spine . Continue current treatment regimen and Continue HEP as Tolerated.12/15/2023 3. Chronic Pain Syndrome: Refilled:  Xtampza 18 mg one capsule every 12 hours for pain #60 and Oxycodone  5/325mg  one tablet twice a day, may take a extra tablet when pain is sever # 75.  Continue Voltaren Gel/ Flector Patch.. We will continue the opioid monitoring program, this consists of regular clinic visits, examinations, urine drug screen, pill counts as well as use of [place] Controlled Substance Reporting system. A 12 month History has been reviewed on the [place] Controlled Substance Reporting System on 12/15/2023. 4. Diabetes with neuropathy. Continue current medication regimen with  Gabapentin. Keep blood sugars under tight control. Continue to monitor. 12/15/2023 5. Cervicalgia/Cervical Radiculopathy/ Thoracic Radiculopathy/ Lumbar Radiculopathy:Continue Gabapentin.12/15/2023 6. Right Greater Trochanter Bursitis: Continue Ice/Heat and Current medication regimen. 12/15/2023 7. Chronic Pain Syndrome/ Myofascial Pain: Continue Flector Patch/ Alternates with Voltaren Gel. 12/15/2023 8. Sacroiliac Pain: No complaints today. Continue HEP as Tolerated. Continue to Monitor.12/15/2023 9. Chronic Right Ankle Pain: .Continue HEP as tolerated. Continue to Monitor. 12/15/2023. 10. Post-Op Pain:  No complaints Today.  S/P LAPAROSCOPIC BILATERAL INGUINAL HERNIA REPAIR AND POSSIBLE APPENDECTOMY Dr Michaell Cowing Following.   11. Polyarthralgia: Continue HEP as Tolerated. Continue to monitor. 12/15/2023  12. Chronic Right  Knee Pain: Continue HEP as Tolerated . Continue current medication regimen. Continue to Monitor.12/15/2023     F/U in 1  month

## 2023-12-18 ENCOUNTER — Encounter: Payer: Self-pay | Admitting: Registered Nurse

## 2023-12-19 LAB — DRUG TOX MONITOR 1 W/CONF, ORAL FLD
Alprazolam: NEGATIVE ng/mL (ref <0.50)
Aminoclonazepam: NEGATIVE ng/mL (ref <0.50)
Amphetamines: NEGATIVE ng/mL (ref <10)
Barbiturates: NEGATIVE ng/mL (ref <10)
Benzodiazepines: POSITIVE ng/mL — AB (ref <0.50)
Buprenorphine: NEGATIVE ng/mL (ref <0.10)
Chlordiazepoxide: NEGATIVE ng/mL (ref <0.50)
Clonazepam: NEGATIVE ng/mL (ref <0.50)
Cocaine: NEGATIVE ng/mL (ref <5.0)
Codeine: NEGATIVE ng/mL (ref <2.5)
Diazepam: 0.56 ng/mL — ABNORMAL HIGH (ref <0.50)
Dihydrocodeine: NEGATIVE ng/mL (ref <2.5)
Fentanyl: NEGATIVE ng/mL (ref <0.10)
Flunitrazepam: NEGATIVE ng/mL (ref <0.50)
Flurazepam: NEGATIVE ng/mL (ref <0.50)
Heroin Metabolite: NEGATIVE ng/mL (ref <1.0)
Hydrocodone: NEGATIVE ng/mL (ref <2.5)
Hydromorphone: NEGATIVE ng/mL (ref <2.5)
Lorazepam: NEGATIVE ng/mL (ref <0.50)
MARIJUANA: NEGATIVE ng/mL (ref <2.5)
MDMA: NEGATIVE ng/mL (ref <10)
Meprobamate: NEGATIVE ng/mL (ref <2.5)
Methadone: NEGATIVE ng/mL (ref <5.0)
Midazolam: NEGATIVE ng/mL (ref <0.50)
Morphine: NEGATIVE ng/mL (ref <2.5)
Nicotine Metabolite: NEGATIVE ng/mL (ref <5.0)
Nordiazepam: NEGATIVE ng/mL (ref <0.50)
Norhydrocodone: NEGATIVE ng/mL (ref <2.5)
Noroxycodone: 20.3 ng/mL — ABNORMAL HIGH (ref <2.5)
Opiates: POSITIVE ng/mL — AB (ref <2.5)
Oxazepam: NEGATIVE ng/mL (ref <0.50)
Oxycodone: 225.5 ng/mL — ABNORMAL HIGH (ref <2.5)
Oxymorphone: NEGATIVE ng/mL (ref <2.5)
Phencyclidine: NEGATIVE ng/mL (ref <10)
Tapentadol: NEGATIVE ng/mL (ref <5.0)
Temazepam: NEGATIVE ng/mL (ref <0.50)
Tramadol: NEGATIVE ng/mL (ref <5.0)
Triazolam: NEGATIVE ng/mL (ref <0.50)
Zolpidem: NEGATIVE ng/mL (ref <5.0)

## 2023-12-19 LAB — DRUG TOX ALC METAB W/CON, ORAL FLD: Alcohol Metabolite: NEGATIVE ng/mL (ref <25)

## 2024-01-11 NOTE — Progress Notes (Signed)
 Subjective:    Patient ID: David [name], male    DOB: 09-19-1976, 48 y.o.   MRN: [id number]  HPI: David [name] is a 48 y.o. male who returns for follow up appointment for chronic pain and medication refill. He states his pain is located in his neck radiating into his bilateral shoulders, mid- lower back radiating into her bilateral lower extremities. Also reports right hip pain, bilateral knee pain and tingling and burning in his bilateral feet. He  rates his  pain 5. His current exercise regime is walking and performing stretching exercises.  Mr. Bogue Morphine equivalent is 82.50 MME.  He is also prescribed Diazepam  by  Drusilla Kanner NPWe have discussed the black box warning of using opioids and benzodiazepines. I highlighted the dangers of using these drugs together and discussed the adverse events including respiratory suppression, overdose, cognitive impairment and importance of compliance with current regimen. We will continue to monitor and adjust as indicated.     Last Oral Swab was Performed on 12/15/2023, it was consistent.    Pain Inventory Average Pain 5 Pain Right Now 5 My pain is constant, sharp, burning, dull, stabbing, tingling, and aching  In the last 24 hours, has pain interfered with the following? General activity 4 Relation with others 0 Enjoyment of life 0 What TIME of day is your pain at its worst? morning , daytime, evening, and night Sleep (in general) Fair  Pain is worse with: walking, bending, sitting, standing, and some activites Pain improves with: rest and medication Relief from Meds: 5  Family History  Problem Relation Age of Onset   Arthritis Mother    Heart disease Mother    Diabetes Mother    Heart disease Father    Diabetes Father    Social History   Socioeconomic History   Marital status: Significant Other    Spouse name: Not on file   Number of children: Not on file   Years of education: Not on file   Highest education level:  Not on file  Occupational History   Not on file  Tobacco Use   Smoking status: Never   Smokeless tobacco: Never  Vaping Use   Vaping status: Never Used  Substance and Sexual Activity   Alcohol use: No   Drug use: Never   Sexual activity: Not on file  Other Topics Concern   Not on file  Social History Narrative   Not on file   Social Drivers of Health   Financial Resource Strain: Not on file  Food Insecurity: Not on file  Transportation Needs: Not on file  Physical Activity: Not on file  Stress: Not on file  Social Connections: Not on file   Past Surgical History:  Procedure Laterality Date   ANTERIOR CERVICAL DECOMP/DISCECTOMY FUSION  02-15-2008  @[facility]    C4--5,  C6--7, C7--T1   COLONOSCOPY     INGUINAL HERNIA REPAIR Bilateral 12/13/2020   Procedure: LAPAROSCOPIC BILATERAL INGUINAL HERNIA REPAIR AND POSSIBLE APPENDECTOMY;  Surgeon: [name], MD;  Location: [facility];  Service: General;  Laterality: Bilateral;  ERAS   Past Surgical History:  Procedure Laterality Date   ANTERIOR CERVICAL DECOMP/DISCECTOMY FUSION  02-15-2008  @[facility]    C4--5,  C6--7, C7--T1   COLONOSCOPY     INGUINAL HERNIA REPAIR Bilateral 12/13/2020   Procedure: LAPAROSCOPIC BILATERAL INGUINAL HERNIA REPAIR AND POSSIBLE APPENDECTOMY;  Surgeon: [name], MD;  Location: [facility];  Service: General;  Laterality: Bilateral;  ERAS  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    Family history of adverse reaction to anesthesia    father--- ponv   GERD (gastroesophageal reflux disease)    occasional, watches diet   Greater trochanteric bursitis of right hip    History of kidney stones    Hypertension    Inguinal hernia, bilateral    Lumbar spondylosis    Nocturia     Peripheral neuropathy    bilateral lower legs and a little of left arm   Postlaminectomy syndrome, cervical region    Primary localized osteoarthrosis, lower leg    Radiculopathy, multiple sites in spine    cervical , thoracic, lumbar   Type 2 diabetes mellitus (HCC)    followed by pcp---  (12-11-2020 checks blood sugar 3 times wkly,  fasting sugar--- 125 )   Wears glasses    BP 105/67   Pulse 82   Ht 6\' 3"  (1.905 m)   Wt 226 lb 9.6 oz (102.8 kg)   SpO2 97%   BMI 28.32 kg/m   Opioid Risk Score:   Fall Risk Score:  `1  Depression screen [facility] 2/9     01/12/2024    3:56 PM 12/15/2023    3:21 PM 11/10/2023    3:12 PM 10/13/2023    3:06 PM 09/14/2023    3:19 PM 07/21/2023    3:12 PM 06/16/2023    3:12 PM  Depression screen PHQ 2/9  Decreased Interest 0 0 0 0 0 0 0  Down, Depressed, Hopeless 0 0 0 0 0 0 0  PHQ - 2 Score 0 0 0 0 0 0 0    Review of Systems     Objective:   Physical Exam Vitals and nursing note reviewed.  Constitutional:      Appearance: Normal appearance.  Neck:     Comments: Cervical Paraspinal Tenderness: C-5-C-6  Cardiovascular:     Rate and Rhythm: Normal rate and regular rhythm.     Pulses: Normal pulses.     Heart sounds: Normal heart sounds.  Pulmonary:     Effort: Pulmonary effort is normal.     Breath sounds: Normal breath sounds.  Musculoskeletal:     Comments: Normal Muscle Bulk and Muscle Testing Reveals:  Upper Extremities: Full ROM and Muscle Strength 5/5 Bilateral AC Joint Tenderness  Thoracic and Lumbar Hypersensitivity Right Greater Trochanter Tenderness Lower Extremities: Full ROM and Muscle Strength 5/5 Arises from Table with ease Narrow Based Gait   Skin:    General: Skin is warm and dry.  Neurological:     Mental Status: He is alert and oriented to person, place, and time.  Psychiatric:        Mood and Affect: Mood normal.        Behavior: Behavior normal.         Assessment & Plan:  1. Cervical postlaminectomy  syndrome, Hx of ACDF C4-5, C6-7, C7-T1, 02/2008. Continue current medication and alternate using heat and ice therapy. 01/12/2024 2. Spondylosis of L-spine . Continue current treatment regimen and Continue HEP as Tolerated.01/12/2024 3. Chronic Pain Syndrome: Refilled:  Xtampza 18 mg one capsule every 12 hours for pain #60 and Oxycodone 5/325mg  one tablet twice a day, may take a extra tablet when pain is sever #  75.  Continue Voltaren Gel/ Flector Patch.. We will continue the opioid monitoring program, this consists of regular clinic visits, examinations, urine drug screen, pill counts as well as use of [place] Controlled Substance Reporting system. A 12 month History has been reviewed on the [place] Controlled Substance Reporting System on 01/12/2024. 4. Diabetes with neuropathy. Continue current medication regimen with  Gabapentin. Keep blood sugars under tight control. Continue to monitor. 01/12/2024 5. Cervicalgia/Cervical Radiculopathy/ Thoracic Radiculopathy/ Lumbar Radiculopathy:Continue Gabapentin.01/12/2024 6. Right Greater Trochanter Bursitis: Continue Ice/Heat and Current medication regimen. 01/12/2024 7. Chronic Pain Syndrome/ Myofascial Pain: Continue Flector Patch/ Alternates with Voltaren Gel. 01/12/2024 8. Sacroiliac Pain: No complaints today. Continue HEP as Tolerated. Continue to Monitor.01/12/2024 9. Chronic Right Ankle Pain: .Continue HEP as tolerated. Continue to Monitor. 01/12/2024. 10. Post-Op Pain:  No complaints Today.  S/P LAPAROSCOPIC BILATERAL INGUINAL HERNIA REPAIR AND POSSIBLE APPENDECTOMY Dr Michaell Cowing Following.   11. Polyarthralgia: Continue HEP as Tolerated. Continue to monitor. 01/12/2024  12. Chronic Right  Knee Pain: Continue HEP as Tolerated . Continue current medication regimen. Continue to Monitor.01/12/2024     F/U in 1 month

## 2024-01-12 ENCOUNTER — Encounter: Payer: Self-pay | Admitting: Registered Nurse

## 2024-01-12 ENCOUNTER — Encounter
Payer: No Typology Code available for payment source | Attending: Physical Medicine & Rehabilitation | Admitting: Registered Nurse

## 2024-01-12 VITALS — BP 105/67 | HR 82 | Ht 75.0 in | Wt 226.6 lb

## 2024-01-12 DIAGNOSIS — M25571 Pain in right ankle and joints of right foot: Secondary | ICD-10-CM | POA: Insufficient documentation

## 2024-01-12 DIAGNOSIS — G894 Chronic pain syndrome: Secondary | ICD-10-CM | POA: Diagnosis present

## 2024-01-12 DIAGNOSIS — M2550 Pain in unspecified joint: Secondary | ICD-10-CM | POA: Insufficient documentation

## 2024-01-12 DIAGNOSIS — G8929 Other chronic pain: Secondary | ICD-10-CM | POA: Diagnosis present

## 2024-01-12 DIAGNOSIS — M47817 Spondylosis without myelopathy or radiculopathy, lumbosacral region: Secondary | ICD-10-CM | POA: Insufficient documentation

## 2024-01-12 DIAGNOSIS — M542 Cervicalgia: Secondary | ICD-10-CM | POA: Insufficient documentation

## 2024-01-12 DIAGNOSIS — M5412 Radiculopathy, cervical region: Secondary | ICD-10-CM | POA: Diagnosis not present

## 2024-01-12 DIAGNOSIS — Z79891 Long term (current) use of opiate analgesic: Secondary | ICD-10-CM | POA: Diagnosis present

## 2024-01-12 DIAGNOSIS — G5793 Unspecified mononeuropathy of bilateral lower limbs: Secondary | ICD-10-CM | POA: Diagnosis present

## 2024-01-12 DIAGNOSIS — Z5181 Encounter for therapeutic drug level monitoring: Secondary | ICD-10-CM | POA: Diagnosis present

## 2024-01-12 DIAGNOSIS — M5416 Radiculopathy, lumbar region: Secondary | ICD-10-CM | POA: Diagnosis present

## 2024-01-12 DIAGNOSIS — M546 Pain in thoracic spine: Secondary | ICD-10-CM | POA: Diagnosis not present

## 2024-01-12 DIAGNOSIS — M7061 Trochanteric bursitis, right hip: Secondary | ICD-10-CM | POA: Diagnosis present

## 2024-01-12 MED ORDER — OXYCODONE-ACETAMINOPHEN 5-325 MG PO TABS: 1.0000 | ORAL_TABLET | Freq: Two times a day (BID) | ORAL | 0 refills | Status: DC

## 2024-01-12 MED ORDER — OXYCODONE ER 18 MG PO C12A: 1.0000 | EXTENDED_RELEASE_CAPSULE | Freq: Two times a day (BID) | ORAL | 0 refills | Status: DC

## 2024-02-09 ENCOUNTER — Encounter
Payer: No Typology Code available for payment source | Attending: Physical Medicine & Rehabilitation | Admitting: Registered Nurse

## 2024-02-09 ENCOUNTER — Encounter: Payer: Self-pay | Admitting: Registered Nurse

## 2024-02-09 VITALS — BP 120/77 | HR 84 | Ht 75.0 in | Wt 226.6 lb

## 2024-02-09 DIAGNOSIS — M7061 Trochanteric bursitis, right hip: Secondary | ICD-10-CM

## 2024-02-09 DIAGNOSIS — M5412 Radiculopathy, cervical region: Secondary | ICD-10-CM | POA: Diagnosis present

## 2024-02-09 DIAGNOSIS — M25571 Pain in right ankle and joints of right foot: Secondary | ICD-10-CM | POA: Insufficient documentation

## 2024-02-09 DIAGNOSIS — Z5181 Encounter for therapeutic drug level monitoring: Secondary | ICD-10-CM | POA: Diagnosis present

## 2024-02-09 DIAGNOSIS — Z79891 Long term (current) use of opiate analgesic: Secondary | ICD-10-CM | POA: Diagnosis present

## 2024-02-09 DIAGNOSIS — M546 Pain in thoracic spine: Secondary | ICD-10-CM | POA: Insufficient documentation

## 2024-02-09 DIAGNOSIS — M25561 Pain in right knee: Secondary | ICD-10-CM | POA: Diagnosis present

## 2024-02-09 DIAGNOSIS — M47817 Spondylosis without myelopathy or radiculopathy, lumbosacral region: Secondary | ICD-10-CM | POA: Diagnosis present

## 2024-02-09 DIAGNOSIS — M542 Cervicalgia: Secondary | ICD-10-CM | POA: Diagnosis not present

## 2024-02-09 DIAGNOSIS — M25562 Pain in left knee: Secondary | ICD-10-CM | POA: Diagnosis present

## 2024-02-09 DIAGNOSIS — M2550 Pain in unspecified joint: Secondary | ICD-10-CM | POA: Diagnosis present

## 2024-02-09 DIAGNOSIS — G8929 Other chronic pain: Secondary | ICD-10-CM | POA: Diagnosis present

## 2024-02-09 DIAGNOSIS — G894 Chronic pain syndrome: Secondary | ICD-10-CM

## 2024-02-09 DIAGNOSIS — M5416 Radiculopathy, lumbar region: Secondary | ICD-10-CM | POA: Diagnosis present

## 2024-02-09 MED ORDER — OXYCODONE-ACETAMINOPHEN 5-325 MG PO TABS: 1.0000 | ORAL_TABLET | Freq: Two times a day (BID) | ORAL | 0 refills | Status: DC

## 2024-02-09 MED ORDER — OXYCODONE ER 18 MG PO C12A
1.0000 | EXTENDED_RELEASE_CAPSULE | Freq: Two times a day (BID) | ORAL | 0 refills | Status: DC
Start: 2024-02-09 — End: 2024-03-08

## 2024-02-09 NOTE — Progress Notes (Unsigned)
 Subjective:    Patient ID: David [name], male    DOB: February 11, 1976, 48 y.o.   MRN: [id number]  HPI: David [name] is a 48 y.o. male who returns for follow up appointment for chronic pain and medication refill. He states his pain is located in his neck radiating into his bilateral shoulders, upper- lower back radiating into his bilateral lower extremities and right hip pain. Also reports bilateral kne epain and right ankle pain.[name] He rates his pain 5. His current exercise regime is walking and performing stretching exercises.  Ms. Climer Morphine equivalent is 78.75 MME. He is also prescribed Diazepam by Drusilla Kanner NP .We have discussed the black box warning of using opioids and benzodiazepines. I highlighted the dangers of using these drugs together and discussed the adverse events including respiratory suppression, overdose, cognitive impairment and importance of compliance with current regimen. We will continue to monitor and adjust as indicated.      Last Oral Swab was Performed on 12/15/2023, it was consistent.   Vitals: 120/77 P: 84 Sats 97%   Pain Inventory Average Pain 5 Pain Right Now 5 My pain is intermittent, constant, sharp, burning, dull, stabbing, tingling, and aching  In the last 24 hours, has pain interfered with the following? General activity 5 Relation with others 4 Enjoyment of life 5 What TIME of day is your pain at its worst? morning , daytime, evening, and night Sleep (in general) Fair  Pain is worse with: walking, bending, sitting, inactivity, standing, and some activites Pain improves with: rest, heat/ice, therapy/exercise, pacing activities, medication, and TENS Relief from Meds: 5  Family History  Problem Relation Age of Onset   Arthritis Mother    Heart disease Mother    Diabetes Mother    Heart disease Father    Diabetes Father    Social History   Socioeconomic History   Marital status: Significant Other    Spouse name: Not on file    Number of children: Not on file   Years of education: Not on file   Highest education level: Not on file  Occupational History   Not on file  Tobacco Use   Smoking status: Never   Smokeless tobacco: Never  Vaping Use   Vaping status: Never Used  Substance and Sexual Activity   Alcohol use: No   Drug use: Never   Sexual activity: Not on file  Other Topics Concern   Not on file  Social History Narrative   Not on file   Social Drivers of Health   Financial Resource Strain: Not on file  Food Insecurity: Not on file  Transportation Needs: Not on file  Physical Activity: Not on file  Stress: Not on file  Social Connections: Not on file   Past Surgical History:  Procedure Laterality Date   ANTERIOR CERVICAL DECOMP/DISCECTOMY FUSION  02-15-2008  @[facility]    C4--5,  C6--7, C7--T1   COLONOSCOPY     INGUINAL HERNIA REPAIR Bilateral 12/13/2020   Procedure: LAPAROSCOPIC BILATERAL INGUINAL HERNIA REPAIR AND POSSIBLE APPENDECTOMY;  Surgeon: [name], MD;  Location: [facility];  Service: General;  Laterality: Bilateral;  ERAS   Past Surgical History:  Procedure Laterality Date   ANTERIOR CERVICAL DECOMP/DISCECTOMY FUSION  02-15-2008  @[facility]    C4--5,  C6--7, C7--T1   COLONOSCOPY     INGUINAL HERNIA REPAIR Bilateral 12/13/2020   Procedure: LAPAROSCOPIC BILATERAL INGUINAL HERNIA REPAIR AND POSSIBLE APPENDECTOMY;  Surgeon: [name], MD;  Location: [facility] [place];  Service: General;  Laterality: Bilateral;  ERAS   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    Family history of adverse reaction to anesthesia    father--- ponv   GERD (gastroesophageal reflux disease)    occasional, watches diet   Greater trochanteric bursitis of right hip    History of kidney  stones    Hypertension    Inguinal hernia, bilateral    Lumbar spondylosis    Nocturia    Peripheral neuropathy    bilateral lower legs and a little of left arm   Postlaminectomy syndrome, cervical region    Primary localized osteoarthrosis, lower leg    Radiculopathy, multiple sites in spine    cervical , thoracic, lumbar   Type 2 diabetes mellitus (HCC)    followed by pcp---  (12-11-2020 checks blood sugar 3 times wkly,  fasting sugar--- 125 )   Wears glasses    Ht 6\' 3"  (1.905 m)   BMI 28.32 kg/m   Opioid Risk Score:   Fall Risk Score:  `1  Depression screen [facility] 2/9     02/09/2024    3:11 PM 01/12/2024    3:56 PM 12/15/2023    3:21 PM 11/10/2023    3:12 PM 10/13/2023    3:06 PM 09/14/2023    3:19 PM 07/21/2023    3:12 PM  Depression screen PHQ 2/9  Decreased Interest 0 0 0 0 0 0 0  Down, Depressed, Hopeless 0 0 0 0 0 0 0  PHQ - 2 Score 0 0 0 0 0 0 0      Review of Systems  All other systems reviewed and are negative.      Objective:   Physical Exam Vitals and nursing note reviewed.  Constitutional:      Appearance: Normal appearance.  Neck:     Comments: Cervical Paraspinal Tenderness: C-5-C-6 Cardiovascular:     Rate and Rhythm: Normal rate and regular rhythm.     Pulses: Normal pulses.     Heart sounds: Normal heart sounds.  Pulmonary:     Effort: Pulmonary effort is normal.     Breath sounds: Normal breath sounds.  Musculoskeletal:     Comments: Normal Muscle Bulk and Muscle Testing Reveals:  Upper Extremities: Full ROM and Muscle Strength  5/5 Thoracic and Lumbar Lower Extremities Gait     Skin:    General: Skin is warm and dry.  Neurological:     Mental Status: He is alert and oriented to person, place, and time.  Psychiatric:        Mood and Affect: Mood normal.        Behavior: Behavior normal.           Assessment & Plan:  1. Cervical postlaminectomy syndrome, Hx of ACDF C4-5, C6-7, C7-T1, 02/2008. Continue current medication and  alternate using heat and ice therapy. 02/09/2024 2. Spondylosis of L-spine . Continue current treatment regimen and Continue HEP as Tolerated.02/09/2024 3. Chronic Pain Syndrome: Refilled:  Xtampza 18 mg one capsule every 12 hours for pain #60 and Oxycodone 5/325mg  one tablet twice a day, may take a extra tablet when pain is sever # 75.  Continue Voltaren Gel/ Flector Patch.. We will continue the opioid monitoring program, this consists of regular clinic visits,  examinations, urine drug screen, pill counts as well as use of [place] Controlled Substance Reporting system. A 12 month History has been reviewed on the [place] Controlled Substance Reporting System on 02/09/2024. 4. Diabetes with neuropathy. Continue current medication regimen with  Gabapentin. Keep blood sugars under tight control. Continue to monitor. 02/09/2024 5. Cervicalgia/Cervical Radiculopathy/ Thoracic Radiculopathy/ Lumbar Radiculopathy:Continue Gabapentin.02/09/2024 6. Right Greater Trochanter Bursitis: Continue Ice/Heat and Current medication regimen. 02/09/2024 7. Chronic Pain Syndrome/ Myofascial Pain: Continue Flector Patch/ Alternates with Voltaren Gel. 02/09/2024 8. Sacroiliac Pain: No complaints today. Continue HEP as Tolerated. Continue to Monitor.02/09/2024 9. Chronic Right Ankle Pain: .Continue HEP as tolerated. Continue to Monitor. 02/09/2024. 10. Post-Op Pain:  No complaints Today.  S/P LAPAROSCOPIC BILATERAL INGUINAL HERNIA REPAIR AND POSSIBLE APPENDECTOMY Dr Michaell Cowing Following.   11. Polyarthralgia: Continue HEP as Tolerated. Continue to monitor. 02/09/2024  12. Chronic Bilateral  Knee Pain: Continue HEP as Tolerated . Continue current medication regimen. Continue to Monitor.02/09/2024     F/U in 1 month

## 2024-03-08 ENCOUNTER — Encounter
Payer: No Typology Code available for payment source | Attending: Physical Medicine & Rehabilitation | Admitting: Registered Nurse

## 2024-03-08 ENCOUNTER — Encounter: Payer: Self-pay | Admitting: Registered Nurse

## 2024-03-08 VITALS — BP 113/76 | HR 86 | Ht 75.0 in | Wt 227.2 lb

## 2024-03-08 DIAGNOSIS — G8929 Other chronic pain: Secondary | ICD-10-CM

## 2024-03-08 DIAGNOSIS — M2550 Pain in unspecified joint: Secondary | ICD-10-CM | POA: Diagnosis present

## 2024-03-08 DIAGNOSIS — M25571 Pain in right ankle and joints of right foot: Secondary | ICD-10-CM | POA: Diagnosis present

## 2024-03-08 DIAGNOSIS — M47817 Spondylosis without myelopathy or radiculopathy, lumbosacral region: Secondary | ICD-10-CM

## 2024-03-08 DIAGNOSIS — G894 Chronic pain syndrome: Secondary | ICD-10-CM

## 2024-03-08 DIAGNOSIS — Z5181 Encounter for therapeutic drug level monitoring: Secondary | ICD-10-CM | POA: Diagnosis present

## 2024-03-08 DIAGNOSIS — M546 Pain in thoracic spine: Secondary | ICD-10-CM | POA: Diagnosis not present

## 2024-03-08 DIAGNOSIS — M7061 Trochanteric bursitis, right hip: Secondary | ICD-10-CM

## 2024-03-08 DIAGNOSIS — M5416 Radiculopathy, lumbar region: Secondary | ICD-10-CM

## 2024-03-08 DIAGNOSIS — M542 Cervicalgia: Secondary | ICD-10-CM

## 2024-03-08 DIAGNOSIS — M5412 Radiculopathy, cervical region: Secondary | ICD-10-CM | POA: Diagnosis present

## 2024-03-08 DIAGNOSIS — Z79891 Long term (current) use of opiate analgesic: Secondary | ICD-10-CM

## 2024-03-08 MED ORDER — OXYCODONE ER 18 MG PO C12A: 1.0000 | EXTENDED_RELEASE_CAPSULE | Freq: Two times a day (BID) | ORAL | 0 refills | Status: DC

## 2024-03-08 MED ORDER — OXYCODONE-ACETAMINOPHEN 5-325 MG PO TABS: 1.0000 | ORAL_TABLET | Freq: Two times a day (BID) | ORAL | 0 refills | Status: DC

## 2024-03-08 NOTE — Progress Notes (Signed)
 Subjective:    Patient ID: David [name], male    DOB: August 12, 1976, 48 y.o.   MRN: [id number]  HPI: Bron David [name] is a 48 y.o. male who returns for follow up appointment for chronic pain and medication refill. He states his pain is located in his neck radiating into his let shoulder and let arm with tingling. Also reports mid- lower back pain radiating into his right hips, bilateral lower extremities and bilateral feet with tingling and burning. Also states he has right ankle pain. He rates his pain 5. His current exercise regime is walking and performing stretching exercises.  [name] is 78.75 MME. He is also prescribed Diazepam  by David Carter NP .We have discussed the black box warning of using opioids and benzodiazepines. I highlighted the dangers of using these drugs together and discussed the adverse events including respiratory suppression, overdose, cognitive impairment and importance of compliance with current regimen. We will continue to monitor and adjust as indicated.    Last Oral Swab was Performed on 12/15/2023, it was consistent.     Pain Inventory Average Pain 5 Pain Right Now 5 My pain is constant, sharp, burning, dull, stabbing, tingling, and aching  In the last 24 hours, has pain interfered with the following? General activity 6 Relation with others 5 Enjoyment of life 5 What TIME of day is your pain at its worst? morning , daytime, evening, and night Sleep (in general) Fair  Pain is worse with: walking, bending, sitting, inactivity, and standing Pain improves with: rest, heat/ice, therapy/exercise, pacing activities, medication, and TENS Relief from Meds: 5  Family History  Problem Relation Age of Onset   Arthritis Mother    Heart disease Mother    Diabetes Mother    Heart disease Father    Diabetes Father    Social History   Socioeconomic History   Marital status: Significant Other    Spouse name: Not on file   Number of  children: Not on file   Years of education: Not on file   Highest education level: Not on file  Occupational History   Not on file  Tobacco Use   Smoking status: Never   Smokeless tobacco: Never  Vaping Use   Vaping status: Never Used  Substance and Sexual Activity   Alcohol  use: No   Drug use: Never   Sexual activity: Not on file  Other Topics Concern   Not on file  Social History Narrative   Not on file   Social Drivers of Health   Financial Resource Strain: Not on file  Food Insecurity: Not on file  Transportation Needs: Not on file  Physical Activity: Not on file  Stress: Not on file  Social Connections: Not on file   Past Surgical History:  Procedure Laterality Date   ANTERIOR CERVICAL DECOMP/DISCECTOMY FUSION  02-15-2008  @[facility]    C4--5,  C6--7, C7--T1   COLONOSCOPY     INGUINAL HERNIA REPAIR Bilateral 12/13/2020   Procedure: LAPAROSCOPIC BILATERAL INGUINAL HERNIA REPAIR AND POSSIBLE APPENDECTOMY;  Surgeon: Candyce Champagne, MD;  Location: [facility] [place];  Service: General;  Laterality: Bilateral;  ERAS   Past Surgical History:  Procedure Laterality Date   ANTERIOR CERVICAL DECOMP/DISCECTOMY FUSION  02-15-2008  @[facility]    C4--5,  C6--7, C7--T1   COLONOSCOPY     INGUINAL HERNIA REPAIR Bilateral 12/13/2020   Procedure: LAPAROSCOPIC BILATERAL INGUINAL HERNIA REPAIR AND POSSIBLE APPENDECTOMY;  Surgeon: Candyce Champagne, MD;  Location: [facility] [place];  Service: General;  Laterality: Bilateral;  ERAS   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    Family history of adverse reaction to anesthesia    father--- ponv   GERD (gastroesophageal reflux disease)    occasional, watches diet   Greater trochanteric bursitis of right hip    History of kidney stones     Hypertension    Inguinal hernia, bilateral    Lumbar spondylosis    Nocturia    Peripheral neuropathy    bilateral lower legs and a little of left arm   Postlaminectomy syndrome, cervical region    Primary localized osteoarthrosis, lower leg    Radiculopathy, multiple sites in spine    cervical , thoracic, lumbar   Type 2 diabetes mellitus (HCC)    followed by pcp---  (12-11-2020 checks blood sugar 3 times wkly,  fasting sugar--- 125 )   Wears glasses    BP 113/76   Pulse 86   Ht 6\' 3"  (1.905 m)   Wt 227 lb 3.2 oz (103.1 kg)   SpO2 97%   BMI 28.40 kg/m   Opioid Risk Score:   Fall Risk Score:  `1  Depression screen PHQ 2/9     03/08/2024    3:09 PM 02/09/2024    3:11 PM 01/12/2024    3:56 PM 12/15/2023    3:21 PM 11/10/2023    3:12 PM 10/13/2023    3:06 PM 09/14/2023    3:19 PM  Depression screen PHQ 2/9  Decreased Interest 0 0 0 0 0 0 0  Down, Depressed, Hopeless 0 0 0 0 0 0 0  PHQ - 2 Score 0 0 0 0 0 0 0     Review of Systems  Musculoskeletal:  Positive for back pain and neck pain.  All other systems reviewed and are negative.      Objective:   Physical Exam Vitals and nursing note reviewed.  Constitutional:      Appearance: Normal appearance.  Neck:     Comments: Cervical Paraspinal Tenderness: C-5-C-6 Cardiovascular:     Rate and Rhythm: Normal rate and regular rhythm.     Pulses: Normal pulses.     Heart sounds: Normal heart sounds.  Pulmonary:     Effort: Pulmonary effort is normal.     Breath sounds: Normal breath sounds.  Musculoskeletal:     Comments: Normal Muscle Bulk and Muscle Testing Reveals:  Upper Extremities: Full ROM and Muscle Strength 5/5 Left AC Joint Tenderness  Thoracic and Lumbar Hypersensitivity Right Greater Trochanter Tenderness Lower Extremities: Decreased ROM and Muscle Strength 5/5 Bilateral Lower extremities Flexion Produces Pain into his Bilateral Patella's Arises rom Chair with ease Narrow Based  Gait     Skin:     General: Skin is warm and dry.  Neurological:     Mental Status: He is alert and oriented to person, place, and time.  Psychiatric:        Mood and Affect: Mood normal.        Behavior: Behavior normal.         Assessment & Plan:  1. Cervical postlaminectomy syndrome, Hx of ACDF C4-5, C6-7, C7-T1, 02/2008. Continue current medication and alternate using heat and ice therapy. 03/08/2024 2. Spondylosis of L-spine . Continue current treatment  regimen and Continue HEP as Tolerated.03/08/2024 3. Chronic Pain Syndrome: Refilled:  Xtampza  18 mg one capsule every 12 hours for pain #60 and Oxycodone  5/325mg  one tablet twice a day, may take a extra tablet when pain is sever # 75.  Continue Voltaren  Gel/ Flector  Patch.. We will continue the opioid monitoring program, this consists of regular clinic visits, examinations, urine drug screen, pill counts as well as use of [place]  Controlled Substance Reporting system. A 12 month History has been reviewed on the [place]  Controlled Substance Reporting System on 03/08/2024. 4. Diabetes with neuropathy. Continue current medication regimen with  Gabapentin . Keep blood sugars under tight control. Continue to monitor. 0507/2025 5. Cervicalgia/Cervical Radiculopathy/ Thoracic Radiculopathy/ Lumbar Radiculopathy:Continue Gabapentin .03/08/2024 6. Right Greater Trochanter Bursitis: Continue Ice/Heat and Current medication regimen. 03/08/2024 7. Chronic Pain Syndrome/ Myofascial Pain: Continue Flector  Patch/ Alternates with Voltaren  Gel. 03/08/2024 8. Sacroiliac Pain: No complaints today. Continue HEP as Tolerated. Continue to Monitor.03/08/2024 9. Chronic Right Ankle Pain: .Continue HEP as tolerated. Continue to Monitor. 03/08/2024. 10. Post-Op Pain:  No complaints Today.  S/P LAPAROSCOPIC BILATERAL INGUINAL HERNIA REPAIR AND POSSIBLE APPENDECTOMY Dr Hershell Lose Following.   11. Polyarthralgia: Continue HEP as Tolerated. Continue to monitor. 03/08/2024  12.  Chronic Bilateral  Knee Pain: Continue HEP as Tolerated . Continue current medication regimen. Continue to Monitor.03/08/2024     F/U in 1 month

## 2024-04-12 ENCOUNTER — Encounter: Payer: Self-pay | Admitting: Registered Nurse

## 2024-04-12 ENCOUNTER — Encounter
Payer: No Typology Code available for payment source | Attending: Physical Medicine & Rehabilitation | Admitting: Registered Nurse

## 2024-04-12 ENCOUNTER — Telehealth: Payer: Self-pay

## 2024-04-12 VITALS — Wt 228.0 lb

## 2024-04-12 DIAGNOSIS — M2550 Pain in unspecified joint: Secondary | ICD-10-CM | POA: Insufficient documentation

## 2024-04-12 DIAGNOSIS — M546 Pain in thoracic spine: Secondary | ICD-10-CM | POA: Insufficient documentation

## 2024-04-12 DIAGNOSIS — Z5181 Encounter for therapeutic drug level monitoring: Secondary | ICD-10-CM | POA: Insufficient documentation

## 2024-04-12 DIAGNOSIS — G894 Chronic pain syndrome: Secondary | ICD-10-CM | POA: Insufficient documentation

## 2024-04-12 DIAGNOSIS — M5416 Radiculopathy, lumbar region: Secondary | ICD-10-CM | POA: Insufficient documentation

## 2024-04-12 DIAGNOSIS — M542 Cervicalgia: Secondary | ICD-10-CM | POA: Diagnosis not present

## 2024-04-12 DIAGNOSIS — Z79891 Long term (current) use of opiate analgesic: Secondary | ICD-10-CM | POA: Insufficient documentation

## 2024-04-12 DIAGNOSIS — M25562 Pain in left knee: Secondary | ICD-10-CM | POA: Insufficient documentation

## 2024-04-12 DIAGNOSIS — M47817 Spondylosis without myelopathy or radiculopathy, lumbosacral region: Secondary | ICD-10-CM | POA: Insufficient documentation

## 2024-04-12 DIAGNOSIS — M7061 Trochanteric bursitis, right hip: Secondary | ICD-10-CM | POA: Insufficient documentation

## 2024-04-12 DIAGNOSIS — G8929 Other chronic pain: Secondary | ICD-10-CM | POA: Insufficient documentation

## 2024-04-12 DIAGNOSIS — M25571 Pain in right ankle and joints of right foot: Secondary | ICD-10-CM | POA: Insufficient documentation

## 2024-04-12 DIAGNOSIS — M5412 Radiculopathy, cervical region: Secondary | ICD-10-CM | POA: Diagnosis not present

## 2024-04-12 DIAGNOSIS — M25561 Pain in right knee: Secondary | ICD-10-CM | POA: Insufficient documentation

## 2024-04-12 DIAGNOSIS — G5793 Unspecified mononeuropathy of bilateral lower limbs: Secondary | ICD-10-CM | POA: Insufficient documentation

## 2024-04-12 MED ORDER — OXYCODONE-ACETAMINOPHEN 5-325 MG PO TABS: 1.0000 | ORAL_TABLET | Freq: Two times a day (BID) | ORAL | 0 refills | Status: DC

## 2024-04-12 MED ORDER — OXYCODONE ER 18 MG PO C12A: 1.0000 | EXTENDED_RELEASE_CAPSULE | Freq: Two times a day (BID) | ORAL | 0 refills | Status: DC

## 2024-04-12 NOTE — Telephone Encounter (Signed)
 Called and spoke with patient to follow up prior to [facility] video appointment with Emilia Harbour. Patient did not obtain vitals today, patient reported weight, medication information and patient denies any changes to medication allergies.  Patient advised to login to mychart 15-20 minutes prior to appointment.  Patient verbalized understanding

## 2024-04-12 NOTE — Progress Notes (Signed)
 Subjective:    Patient ID: David [name], male    DOB: [birth year], 48 y.o.   MRN: [id number]  HPI: David [name] is a 48 y.o. male who  called the office reporting he was ill, his appointment was changed to virtual visit.  I connected with Mr. Dyan ) Kerlin  by a video enabled telemedicine application and verified that I am speaking with the correct person using two identifiers.  Location: Patient: In his Home  Provider: In the Office    I discussed the limitations of evaluation and management by telemedicine and the availability of in person appointments. The patient expressed understanding and agreed to proceed.  He states his pain is located in his  neck radiating into his bilateral shoulders, mid- lower back radiating into his right hip and bilateral lower extremities. Also reports bilateral knee pain and bilateral feet with tingling and burning. SABRAHe  rates his pain 5. His current exercise regime is walking and performing stretching exercises.  Mr. Shelnutt Morphine equivalent is 78.75 MME. He is also prescribed Diazepam by Angeline Iba  .We have discussed the black box warning of using opioids and benzodiazepines. I highlighted the dangers of using these drugs together and discussed the adverse events including respiratory suppression, overdose, cognitive impairment and importance of compliance with current regimen. We will continue to monitor and adjust as indicated.   Last Oral Swab was Performed on 12/15/2023, it was consistent.    Pain Inventory Average Pain 5 Pain Right Now 5 My pain is intermittent, constant, sharp, burning, dull, stabbing, tingling, and aching  In the last 24 hours, has pain interfered with the following? General activity 4 Relation with others 4 Enjoyment of life 4 What TIME of day is your pain at its worst? morning , daytime, evening, and night Sleep (in general) Fair  Pain is worse with: walking, bending, sitting, standing, and some  activites Pain improves with: rest, heat/ice, pacing activities, and medication Relief from Meds: 4  Family History  Problem Relation Age of Onset   Arthritis Mother    Heart disease Mother    Diabetes Mother    Heart disease Father    Diabetes Father    Social History   Socioeconomic History   Marital status: Significant Other    Spouse name: Not on file   Number of children: Not on file   Years of education: Not on file   Highest education level: Not on file  Occupational History   Not on file  Tobacco Use   Smoking status: Never   Smokeless tobacco: Never  Vaping Use   Vaping status: Never Used  Substance and Sexual Activity   Alcohol  use: No   Drug use: Never   Sexual activity: Not on file  Other Topics Concern   Not on file  Social History Narrative   Not on file   Social Drivers of Health   Financial Resource Strain: Not on file  Food Insecurity: Not on file  Transportation Needs: Not on file  Physical Activity: Not on file  Stress: Not on file  Social Connections: Not on file   Past Surgical History:  Procedure Laterality Date   ANTERIOR CERVICAL DECOMP/DISCECTOMY FUSION  02-15-2008  @[facility]    C4--5,  C6--7, C7--T1   COLONOSCOPY     INGUINAL HERNIA REPAIR Bilateral 12/13/2020   Procedure: LAPAROSCOPIC BILATERAL INGUINAL HERNIA REPAIR AND POSSIBLE APPENDECTOMY;  Surgeon: Sheldon Standing, MD;  Location: [facility];  Service: General;  Laterality:  Bilateral;  ERAS   Past Surgical History:  Procedure Laterality Date   ANTERIOR CERVICAL DECOMP/DISCECTOMY FUSION  02-15-2008  @[facility]    C4--5,  C6--7, C7--T1   COLONOSCOPY     INGUINAL HERNIA REPAIR Bilateral 12/13/2020   Procedure: LAPAROSCOPIC BILATERAL INGUINAL HERNIA REPAIR AND POSSIBLE APPENDECTOMY;  Surgeon: Sheldon Standing, MD;  Location: [facility];  Service: General;  Laterality: Bilateral;  ERAS   Past Medical History:  Diagnosis Date   Cervicalgia    Chronic pain syndrome     pain clinic   Complication of anesthesia    post op urinary retention after ACDF surgery 04/ 2009   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    Family history of adverse reaction to anesthesia    father--- ponv   GERD (gastroesophageal reflux disease)    occasional, watches diet   Greater trochanteric bursitis of right hip    History of kidney stones    Hypertension    Inguinal hernia, bilateral    Lumbar spondylosis    Nocturia    Peripheral neuropathy    bilateral lower legs and a little of left arm   Postlaminectomy syndrome, cervical region    Primary localized osteoarthrosis, lower leg    Radiculopathy, multiple sites in spine    cervical , thoracic, lumbar   Type 2 diabetes mellitus (HCC)    followed by pcp---  (12-11-2020 checks blood sugar 3 times wkly,  fasting sugar--- 125 )   Wears glasses    There were no vitals taken for this visit.  Opioid Risk Score:   Fall Risk Score:  `1  Depression screen [facility] 2/9     04/12/2024    1:43 PM 03/08/2024    3:09 PM 02/09/2024    3:11 PM 01/12/2024    3:56 PM 12/15/2023    3:21 PM 11/10/2023    3:12 PM 10/13/2023    3:06 PM  Depression screen PHQ 2/9  Decreased Interest 0 0 0 0 0 0 0  Down, Depressed, Hopeless 0 0 0 0 0 0 0  PHQ - 2 Score 0 0 0 0 0 0 0  Altered sleeping 0        Tired, decreased energy 0        Change in appetite 0        Feeling bad or failure about yourself  0        Trouble concentrating 0        Moving slowly or fidgety/restless 0        Suicidal thoughts 0        PHQ-9 Score 0             Review of Systems     Objective:   Physical Exam Vitals and nursing note reviewed.   Musculoskeletal:     Comments: No Physical Exam: Virtual Visit         Assessment & Plan:  1. Cervical postlaminectomy syndrome, Hx of ACDF C4-5, C6-7, C7-T1, 02/2008. Continue current medication and alternate using heat and ice therapy. 04/12/2024 2. Spondylosis of L-spine . Continue current  treatment regimen and Continue HEP as Tolerated.04/12/2024 3. Chronic Pain Syndrome: Refilled:  Xtampza  18 mg one capsule every 12 hours for pain #60 and Oxycodone  5/325mg  one tablet twice a day, may take a extra tablet when pain is sever # 75.  Continue Voltaren  Gel/ Flector  Patch.. We will continue the opioid monitoring program, this consists of regular clinic visits, examinations, urine drug screen,  pill counts as well as use of [place]  Controlled Substance Reporting system. A 12 month History has been reviewed on the [place]  Controlled Substance Reporting System on 04/12/2024. 4. Diabetes with neuropathy. Continue current medication regimen with  Gabapentin . Keep blood sugars under tight control. Continue to monitor. 04/12/2024 5. Cervicalgia/Cervical Radiculopathy/ Thoracic Radiculopathy/ Lumbar Radiculopathy:Continue Gabapentin .04/12/2024 6. Right Greater Trochanter Bursitis: Continue Ice/Heat and Current medication regimen. 04/12/2024 7. Chronic Pain Syndrome/ Myofascial Pain: Continue Flector  Patch/ Alternates with Voltaren  Gel. 04/12/2024 8. Sacroiliac Pain: No complaints today. Continue HEP as Tolerated. Continue to Monitor.04/12/2024 9. Chronic Right Ankle Pain: .Continue HEP as tolerated. Continue to Monitor. 04/12/2024. 10. Post-Op Pain:  No complaints Today.  S/P LAPAROSCOPIC BILATERAL INGUINAL HERNIA REPAIR AND POSSIBLE APPENDECTOMY Dr Sheldon Following.   11. Polyarthralgia: Continue HEP as Tolerated. Continue to monitor. 04/12/2024  12. Chronic Bilateral  Knee Pain: Continue HEP as Tolerated . Continue current medication regimen. Continue to Monitor.04/12/2024     F/U in 1 month  Virtual Visit Established Patient Location of Patient: In hjis Home Location o Provider: In the [facility]

## 2024-05-10 ENCOUNTER — Encounter: Payer: Self-pay | Admitting: Registered Nurse

## 2024-05-10 ENCOUNTER — Encounter: Attending: Physical Medicine & Rehabilitation | Admitting: Registered Nurse

## 2024-05-10 VITALS — BP 113/75 | HR 94 | Ht 75.0 in | Wt 222.0 lb

## 2024-05-10 DIAGNOSIS — M7061 Trochanteric bursitis, right hip: Secondary | ICD-10-CM | POA: Insufficient documentation

## 2024-05-10 DIAGNOSIS — G5692 Unspecified mononeuropathy of left upper limb: Secondary | ICD-10-CM | POA: Insufficient documentation

## 2024-05-10 DIAGNOSIS — M47817 Spondylosis without myelopathy or radiculopathy, lumbosacral region: Secondary | ICD-10-CM | POA: Insufficient documentation

## 2024-05-10 DIAGNOSIS — M25562 Pain in left knee: Secondary | ICD-10-CM | POA: Insufficient documentation

## 2024-05-10 DIAGNOSIS — M25571 Pain in right ankle and joints of right foot: Secondary | ICD-10-CM | POA: Diagnosis present

## 2024-05-10 DIAGNOSIS — M25561 Pain in right knee: Secondary | ICD-10-CM | POA: Diagnosis present

## 2024-05-10 DIAGNOSIS — Z79891 Long term (current) use of opiate analgesic: Secondary | ICD-10-CM | POA: Insufficient documentation

## 2024-05-10 DIAGNOSIS — M5416 Radiculopathy, lumbar region: Secondary | ICD-10-CM | POA: Diagnosis present

## 2024-05-10 DIAGNOSIS — M546 Pain in thoracic spine: Secondary | ICD-10-CM | POA: Insufficient documentation

## 2024-05-10 DIAGNOSIS — M542 Cervicalgia: Secondary | ICD-10-CM | POA: Insufficient documentation

## 2024-05-10 DIAGNOSIS — G5793 Unspecified mononeuropathy of bilateral lower limbs: Secondary | ICD-10-CM | POA: Diagnosis present

## 2024-05-10 DIAGNOSIS — M5412 Radiculopathy, cervical region: Secondary | ICD-10-CM | POA: Insufficient documentation

## 2024-05-10 DIAGNOSIS — M2550 Pain in unspecified joint: Secondary | ICD-10-CM | POA: Diagnosis present

## 2024-05-10 DIAGNOSIS — G8929 Other chronic pain: Secondary | ICD-10-CM | POA: Insufficient documentation

## 2024-05-10 DIAGNOSIS — Z5181 Encounter for therapeutic drug level monitoring: Secondary | ICD-10-CM | POA: Diagnosis present

## 2024-05-10 DIAGNOSIS — G894 Chronic pain syndrome: Secondary | ICD-10-CM | POA: Insufficient documentation

## 2024-05-10 MED ORDER — OXYCODONE ER 18 MG PO C12A: 1.0000 | EXTENDED_RELEASE_CAPSULE | Freq: Two times a day (BID) | ORAL | 0 refills | Status: DC

## 2024-05-10 MED ORDER — OXYCODONE-ACETAMINOPHEN 5-325 MG PO TABS: 1.0000 | ORAL_TABLET | Freq: Two times a day (BID) | ORAL | 0 refills | Status: DC

## 2024-05-10 NOTE — Progress Notes (Signed)
 Subjective:    Patient ID: David [name], male    DOB: 1976/09/29, 48 y.o.   MRN: [id number]  HPI: [name] is a 48 y.o. male who returns for follow up appointment for chronic pain and medication refill. He states his pain is located in his neck radiating into his bilateral shoulders and left arm with tingling. Also reports upper- lower back pain radiating into his bilateral lower extremities and bilateral feet with tingling and burning, right hip and bilateral knee pain. He rates his pain 5. His current exercise regime is walking and performing stretching exercises.  [name] Morphine equivalent is 78.75 MME.He is also prescribed Diazepam  by Angeline Iba NP .We have discussed the black box warning of using opioids and benzodiazepines. I highlighted the dangers of using these drugs together and discussed the adverse events including respiratory suppression, overdose, cognitive impairment and importance of compliance with current regimen. We will continue to monitor and adjust as indicated.   Oral Swab was Performed today.   Pain Inventory Average Pain 5 Pain Right Now 5 My pain is constant, sharp, burning, dull, stabbing, tingling, and aching  In the last 24 hours, has pain interfered with the following? General activity 4 Relation with others 4 Enjoyment of life 5 What TIME of day is your pain at its worst? morning , daytime, evening, and night Sleep (in general) Fair  Pain is worse with: walking, bending, sitting, standing, and some activites Pain improves with: rest, heat/ice, therapy/exercise, pacing activities, medication, and TENS Relief from Meds: 5  Family History  Problem Relation Age of Onset   Arthritis Mother    Heart disease Mother    Diabetes Mother    Heart disease Father    Diabetes Father    Social History   Socioeconomic History   Marital status: Significant Other    Spouse name: Not on file   Number of children: Not on file   Years of  education: Not on file   Highest education level: Not on file  Occupational History   Not on file  Tobacco Use   Smoking status: Never   Smokeless tobacco: Never  Vaping Use   Vaping status: Never Used  Substance and Sexual Activity   Alcohol  use: No   Drug use: Never   Sexual activity: Not on file  Other Topics Concern   Not on file  Social History Narrative   Not on file   Social Drivers of Health   Financial Resource Strain: Not on file  Food Insecurity: Not on file  Transportation Needs: Not on file  Physical Activity: Not on file  Stress: Not on file  Social Connections: Not on file   Past Surgical History:  Procedure Laterality Date   ANTERIOR CERVICAL DECOMP/DISCECTOMY FUSION  02-15-2008  @[facility]    C4--5,  C6--7, C7--T1   COLONOSCOPY     INGUINAL HERNIA REPAIR Bilateral 12/13/2020   Procedure: LAPAROSCOPIC BILATERAL INGUINAL HERNIA REPAIR AND POSSIBLE APPENDECTOMY;  Surgeon: Sheldon Standing, MD;  Location: [facility];  Service: General;  Laterality: Bilateral;  ERAS   Past Surgical History:  Procedure Laterality Date   ANTERIOR CERVICAL DECOMP/DISCECTOMY FUSION  02-15-2008  @[facility]    C4--5,  C6--7, C7--T1   COLONOSCOPY     INGUINAL HERNIA REPAIR Bilateral 12/13/2020   Procedure: LAPAROSCOPIC BILATERAL INGUINAL HERNIA REPAIR AND POSSIBLE APPENDECTOMY;  Surgeon: Sheldon Standing, MD;  Location: [facility];  Service: General;  Laterality: Bilateral;  ERAS   Past  Medical History:  Diagnosis Date   Cervicalgia    Chronic pain syndrome    pain clinic   Complication of anesthesia    post op urinary retention after ACDF surgery 04/ 2009   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    Family history of adverse reaction to anesthesia    father--- ponv   GERD (gastroesophageal reflux disease)    occasional, watches diet   Greater trochanteric bursitis of right hip    History of kidney stones    Hypertension    Inguinal hernia,  bilateral    Lumbar spondylosis    Nocturia    Peripheral neuropathy    bilateral lower legs and a little of left arm   Postlaminectomy syndrome, cervical region    Primary localized osteoarthrosis, lower leg    Radiculopathy, multiple sites in spine    cervical , thoracic, lumbar   Type 2 diabetes mellitus (HCC)    followed by pcp---  (12-11-2020 checks blood sugar 3 times wkly,  fasting sugar--- 125 )   Wears glasses    BP 113/75   Pulse 94   Ht 6' 3 (1.905 m)   Wt 222 lb (100.7 kg)   SpO2 96%   BMI 27.75 kg/m   Opioid Risk Score:   Fall Risk Score:  `1  Depression screen [facility] 2/9     05/10/2024    3:44 PM 04/12/2024    1:43 PM 03/08/2024    3:09 PM 02/09/2024    3:11 PM 01/12/2024    3:56 PM 12/15/2023    3:21 PM 11/10/2023    3:12 PM  Depression screen PHQ 2/9  Decreased Interest 0 0 0 0 0 0 0  Down, Depressed, Hopeless 0 0 0 0 0 0 0  PHQ - 2 Score 0 0 0 0 0 0 0  Altered sleeping  0       Tired, decreased energy  0       Change in appetite  0       Feeling bad or failure about yourself   0       Trouble concentrating  0       Moving slowly or fidgety/restless  0       Suicidal thoughts  0       PHQ-9 Score  0          Review of Systems  Musculoskeletal:  Positive for back pain, myalgias and neck pain.  All other systems reviewed and are negative.      Objective:   Physical Exam Vitals and nursing note reviewed.  Constitutional:      Appearance: Normal appearance.  Neck:     Comments: Cervical Paraspinal Tenderness: C-5-C-6 Cardiovascular:     Rate and Rhythm: Normal rate and regular rhythm.     Pulses: Normal pulses.     Heart sounds: Normal heart sounds.  Pulmonary:     Effort: Pulmonary effort is normal.     Breath sounds: Normal breath sounds.  Musculoskeletal:     Comments: Normal Muscle Bulk and Muscle Testing Reveals:  Upper Extremities: Full ROM and Muscle Strength 5/5 Bilateral AC Joint Tenderness Thoracic and Lumbar Hypersensitivity Right  Greater Trochanter Tenderness Lower Extremities: Full ROM and Muscle Strength 5/5 Arises from Chair with ease Narrow Based  Gait     Skin:    General: Skin is warm and dry.  Neurological:     Mental Status: He is alert and oriented to person, place, and  time.  Psychiatric:        Mood and Affect: Mood normal.        Behavior: Behavior normal.          Assessment & Plan:  1. Cervical postlaminectomy syndrome, Hx of ACDF C4-5, C6-7, C7-T1, 02/2008. Continue current medication and alternate using heat and ice therapy. 05/10/2024 2. Spondylosis of L-spine . Continue current treatment regimen and Continue HEP as Tolerated.05/10/2024 3. Chronic Pain Syndrome: Refilled:  Xtampza  18 mg one capsule every 12 hours for pain #60 and Oxycodone  5/325mg  one tablet twice a day, may take a extra tablet when pain is sever # 75.  Continue Voltaren  Gel/ Flector  Patch.. We will continue the opioid monitoring program, this consists of regular clinic visits, examinations, urine drug screen, pill counts as well as use of [place]  Controlled Substance Reporting system. A 12 month History has been reviewed on the [place]  Controlled Substance Reporting System on 05/10/2024. 4. Diabetes with neuropathy. Continue current medication regimen with  Gabapentin . Keep blood sugars under tight control. Continue to monitor. 05/10/2024 5. Cervicalgia/Cervical Radiculopathy/ Thoracic Radiculopathy/ Lumbar Radiculopathy:Continue Gabapentin .05/10/2024 6. Right Greater Trochanter Bursitis: Continue Ice/Heat and Current medication regimen. 05/10/2024 7. Chronic Pain Syndrome/ Myofascial Pain: Continue Flector  Patch/ Alternates with Voltaren  Gel. 05/10/2024 8. Sacroiliac Pain: No complaints today. Continue HEP as Tolerated. Continue to Monitor.05/10/2024 9. Chronic Right Ankle Pain: .Continue HEP as tolerated. Continue to Monitor. 05/10/2024. 10. Post-Op Pain:  No complaints Today.  S/P LAPAROSCOPIC BILATERAL INGUINAL  HERNIA REPAIR AND POSSIBLE APPENDECTOMY Dr Sheldon Following.   11. Polyarthralgia: Continue HEP as Tolerated. Continue to monitor. 05/10/2024  12. Chronic Bilateral  Knee Pain: Continue HEP as Tolerated . Continue current medication regimen. Continue to Monitor.05/10/2024     F/U in 1 month

## 2024-05-14 LAB — DRUG TOX MONITOR 1 W/CONF, ORAL FLD
Alprazolam: NEGATIVE ng/mL (ref <0.50)
Aminoclonazepam: NEGATIVE ng/mL (ref <0.50)
Amphetamines: NEGATIVE ng/mL (ref <10)
Barbiturates: NEGATIVE ng/mL (ref <10)
Benzodiazepines: POSITIVE ng/mL — AB (ref <0.50)
Buprenorphine: NEGATIVE ng/mL (ref <0.10)
Chlordiazepoxide: NEGATIVE ng/mL (ref <0.50)
Clonazepam: NEGATIVE ng/mL (ref <0.50)
Cocaine: NEGATIVE ng/mL (ref <5.0)
Codeine: NEGATIVE ng/mL (ref <2.5)
Diazepam: NEGATIVE ng/mL (ref <0.50)
Dihydrocodeine: NEGATIVE ng/mL (ref <2.5)
Fentanyl: NEGATIVE ng/mL (ref <0.10)
Flunitrazepam: NEGATIVE ng/mL (ref <0.50)
Flurazepam: NEGATIVE ng/mL (ref <0.50)
Heroin Metabolite: NEGATIVE ng/mL (ref <1.0)
Hydrocodone: NEGATIVE ng/mL (ref <2.5)
Hydromorphone: NEGATIVE ng/mL (ref <2.5)
Lorazepam: NEGATIVE ng/mL (ref <0.50)
MARIJUANA: NEGATIVE ng/mL (ref <2.5)
MDMA: NEGATIVE ng/mL (ref <10)
Meprobamate: NEGATIVE ng/mL (ref <2.5)
Methadone: NEGATIVE ng/mL (ref <5.0)
Midazolam: NEGATIVE ng/mL (ref <0.50)
Morphine: NEGATIVE ng/mL (ref <2.5)
Nicotine Metabolite: NEGATIVE ng/mL (ref <5.0)
Nordiazepam: 0.6 ng/mL — ABNORMAL HIGH (ref <0.50)
Norhydrocodone: NEGATIVE ng/mL (ref <2.5)
Noroxycodone: 15.2 ng/mL — ABNORMAL HIGH (ref <2.5)
Opiates: POSITIVE ng/mL — AB (ref <2.5)
Oxazepam: NEGATIVE ng/mL (ref <0.50)
Oxycodone: 186.9 ng/mL — ABNORMAL HIGH (ref <2.5)
Oxymorphone: NEGATIVE ng/mL (ref <2.5)
Phencyclidine: NEGATIVE ng/mL (ref <10)
Tapentadol: NEGATIVE ng/mL (ref <5.0)
Temazepam: NEGATIVE ng/mL (ref <0.50)
Tramadol: NEGATIVE ng/mL (ref <5.0)
Triazolam: NEGATIVE ng/mL (ref <0.50)
Zolpidem: NEGATIVE ng/mL (ref <5.0)

## 2024-05-14 LAB — DRUG TOX ALC METAB W/CON, ORAL FLD: Alcohol Metabolite: NEGATIVE ng/mL (ref <25)

## 2024-06-07 ENCOUNTER — Encounter: Attending: Physical Medicine & Rehabilitation | Admitting: Registered Nurse

## 2024-06-07 VITALS — BP 117/73 | HR 82 | Ht 75.0 in | Wt 224.0 lb

## 2024-06-07 DIAGNOSIS — M542 Cervicalgia: Secondary | ICD-10-CM | POA: Insufficient documentation

## 2024-06-07 DIAGNOSIS — M47817 Spondylosis without myelopathy or radiculopathy, lumbosacral region: Secondary | ICD-10-CM | POA: Diagnosis not present

## 2024-06-07 DIAGNOSIS — M7061 Trochanteric bursitis, right hip: Secondary | ICD-10-CM | POA: Insufficient documentation

## 2024-06-07 DIAGNOSIS — M5416 Radiculopathy, lumbar region: Secondary | ICD-10-CM | POA: Diagnosis present

## 2024-06-07 DIAGNOSIS — G8929 Other chronic pain: Secondary | ICD-10-CM | POA: Diagnosis present

## 2024-06-07 DIAGNOSIS — M546 Pain in thoracic spine: Secondary | ICD-10-CM | POA: Insufficient documentation

## 2024-06-07 DIAGNOSIS — M5412 Radiculopathy, cervical region: Secondary | ICD-10-CM | POA: Diagnosis present

## 2024-06-07 DIAGNOSIS — G894 Chronic pain syndrome: Secondary | ICD-10-CM | POA: Insufficient documentation

## 2024-06-07 MED ORDER — OXYCODONE-ACETAMINOPHEN 5-325 MG PO TABS
1.0000 | ORAL_TABLET | Freq: Two times a day (BID) | ORAL | 0 refills | Status: DC
Start: 2024-06-07 — End: 2024-07-05

## 2024-06-07 MED ORDER — OXYCODONE ER 18 MG PO C12A: 1.0000 | EXTENDED_RELEASE_CAPSULE | Freq: Two times a day (BID) | ORAL | 0 refills | Status: DC

## 2024-06-07 NOTE — Progress Notes (Signed)
 Subjective:    Patient ID: David [name], male    DOB: 08-06-1976, 48 y.o.   MRN: [id number]  HPI: David [name] is a 48 y.o. male who returns for follow up appointment for chronic pain and medication refill. He states his pain is located in his neck radiating into his bilateral shoulders, upper- lower back radiating into his bilateral lower extremities and right hip pain. He also reports tingling and burning in his bilateral hands and bilateral feet.SABRA He rates his pain 5. His current exercise regime is walking and performing stretching exercises.  Mr. Lawless Morphine equivalent is 78.75 MME. He is also prescribed Diazepam Tobey Iba NP .We have discussed the black box warning of using opioids and benzodiazepines. I highlighted the dangers of using these drugs together and discussed the adverse events including respiratory suppression, overdose, cognitive impairment and importance of compliance with current regimen. We will continue to monitor and adjust as indicated.     Last Oral Swab was Performed  on 05/10/2024, it was consistent.      Pain Inventory Average Pain 5 Pain Right Now 5 My pain is intermittent, constant, sharp, burning, dull, stabbing, tingling, and aching  In the last 24 hours, has pain interfered with the following? General activity 5 Relation with others 5 Enjoyment of life 6 What TIME of day is your pain at its worst? morning , daytime, evening, and night Sleep (in general) Fair  Pain is worse with: walking, bending, sitting, inactivity, [name], and some activites Pain improves with: rest, heat/ice, therapy/exercise, pacing activities, medication, and TENS Relief from Meds: 5  Family History  Problem Relation Age of Onset   Arthritis Mother    Heart disease Mother    Diabetes Mother    Heart disease Father    Diabetes Father    Social History   Socioeconomic History   Marital status: Significant Other    Spouse name: Not on file   Number of  children: Not on file   Years of education: Not on file   Highest education level: Not on file  Occupational History   Not on file  Tobacco Use   Smoking status: Never   Smokeless tobacco: Never  Vaping Use   Vaping status: Never Used  Substance and Sexual Activity   Alcohol  use: No   Drug use: Never   Sexual activity: Not on file  Other Topics Concern   Not on file  Social History Narrative   Not on file   Social Drivers of Health   Financial Resource Strain: Not on file  Food Insecurity: Not on file  Transportation Needs: Not on file  Physical Activity: Not on file  Stress: Not on file  Social Connections: Not on file   Past Surgical History:  Procedure Laterality Date   ANTERIOR CERVICAL DECOMP/DISCECTOMY FUSION  02-15-2008  @[facility]    C4--5,  C6--7, C7--T1   COLONOSCOPY     INGUINAL HERNIA REPAIR Bilateral 12/13/2020   Procedure: LAPAROSCOPIC BILATERAL INGUINAL HERNIA REPAIR AND POSSIBLE APPENDECTOMY;  Surgeon: David Standing, MD;  Location: [facility];  Service: General;  Laterality: Bilateral;  ERAS   Past Surgical History:  Procedure Laterality Date   ANTERIOR CERVICAL DECOMP/DISCECTOMY FUSION  02-15-2008  @[facility]    C4--5,  C6--7, C7--T1   COLONOSCOPY     INGUINAL HERNIA REPAIR Bilateral 12/13/2020   Procedure: LAPAROSCOPIC BILATERAL INGUINAL HERNIA REPAIR AND POSSIBLE APPENDECTOMY;  Surgeon: David Standing, MD;  Location: [facility] [place];  Service: General;  Laterality: Bilateral;  ERAS   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    Family history of adverse reaction to anesthesia    father--- ponv   GERD (gastroesophageal reflux disease)    occasional, watches diet   Greater trochanteric bursitis of right hip    History of kidney stones     Hypertension    Inguinal hernia, bilateral    Lumbar spondylosis    Nocturia    Peripheral neuropathy    bilateral lower legs and a little of left arm   Postlaminectomy syndrome, cervical region    Primary localized osteoarthrosis, lower leg    Radiculopathy, multiple sites in spine    cervical , thoracic, lumbar   Type 2 diabetes mellitus (HCC)    followed by pcp---  (12-11-2020 checks blood sugar 3 times wkly,  fasting sugar--- 125 )   Wears glasses    There were no vitals taken for this visit.  Opioid Risk Score:   Fall Risk Score:  `1  Depression screen [facility] 2/9     05/10/2024    3:44 PM 04/12/2024    1:43 PM 03/08/2024    3:09 PM 02/09/2024    3:11 PM 01/12/2024    3:56 PM 12/15/2023    3:21 PM 11/10/2023    3:12 PM  Depression screen PHQ 2/9  Decreased Interest 0 0 0 0 0 0 0  Down, Depressed, Hopeless 0 0 0 0 0 0 0  PHQ - 2 Score 0 0 0 0 0 0 0  Altered sleeping  0       Tired, decreased energy  0       Change in appetite  0       Feeling bad or failure about yourself   0       Trouble concentrating  0       Moving slowly or fidgety/restless  0       Suicidal thoughts  0       PHQ-9 Score  0         Review of Systems  Musculoskeletal:  Positive for back pain.       Neck pain radiating to back and bilateral legs, pain on both arms       Objective:   Physical Exam Vitals and nursing note reviewed.  Constitutional:      Appearance: Normal appearance.  Neck:     Comments: Cervical Paraspinal Tenderness: C-5-C-6 Cardiovascular:     Rate and Rhythm: Normal rate and regular rhythm.     Pulses: Normal pulses.     Heart sounds: Normal heart sounds.  Pulmonary:     Effort: Pulmonary effort is normal.     Breath sounds: Normal breath sounds.  Musculoskeletal:     Comments: Normal Muscle Bulk and Muscle Testing Reveals:  Upper Extremities: Full ROM and Muscle Strength 5/5 Bilateral AC Joint Tenderness  Thoracic and Lumbar Hypersensitivity Right Greater Trochanter  Tenderness Lower Extremities : Full ROM and Muscle Strength 5/5 Arises from Table with ease Narrow Based Gait     Skin:    General: Skin is warm and dry.  Neurological:     Mental Status: He is alert and oriented to person, place, and time.  Psychiatric:  Mood and Affect: Mood normal.        Behavior: Behavior normal.          Assessment & Plan:  1. Cervical postlaminectomy syndrome, Hx of ACDF C4-5, C6-7, C7-T1, 02/2008. Continue current medication and alternate using heat and ice therapy. 06/07/2024 2. Spondylosis of L-spine . Continue current treatment regimen and Continue HEP as Tolerated.06/07/2024 3. Chronic Pain Syndrome: Refilled:  Xtampza  18 mg one capsule every 12 hours for pain #60 and Oxycodone  5/325mg  one tablet twice a day, may take a extra tablet when pain is sever # 75.  Continue Voltaren  Gel/ Flector  Patch.. We will continue the opioid monitoring program, this consists of regular clinic visits, examinations, urine drug screen, pill counts as well as use of [place]  Controlled Substance Reporting system. A 12 month History has been reviewed on the [place]  Controlled Substance Reporting System on 06/07/2024. 4. Diabetes with neuropathy. Continue current medication regimen with  Gabapentin . Keep blood sugars under tight control. Continue to monitor. 06/07/2024 5. Cervicalgia/Cervical Radiculopathy/ Thoracic Radiculopathy/ Lumbar Radiculopathy:Continue Gabapentin .06/07/2024 6. Right Greater Trochanter Bursitis: Continue Ice/Heat and Current medication regimen. 06/07/2024 7. Chronic Pain Syndrome/ Myofascial Pain: Continue Flector  Patch/ Alternates with Voltaren  Gel. 06/07/2024 8. Sacroiliac Pain: No complaints today. Continue HEP as Tolerated. Continue to Monitor.06/07/2024 9. Chronic Right Ankle Pain: .Continue HEP as tolerated. Continue to Monitor. 06/07/2024. 10. Post-Op Pain:  No complaints Today.  S/P LAPAROSCOPIC BILATERAL INGUINAL HERNIA REPAIR AND  POSSIBLE APPENDECTOMY Dr David Following.   11. Polyarthralgia: Continue HEP as Tolerated. Continue to monitor. 06/07/2024  12. Chronic Bilateral  Knee Pain: Continue HEP as Tolerated . Continue current medication regimen. Continue to Monitor.06/07/2024     F/U in 1 month

## 2024-06-30 ENCOUNTER — Telehealth: Payer: Self-pay

## 2024-06-30 ENCOUNTER — Encounter: Payer: Self-pay | Admitting: Registered Nurse

## 2024-06-30 NOTE — Telephone Encounter (Addendum)
 Oxycodone  ER 18 mg / Xtampza  PA submitted on 06/30/2024 to [facility] in Cover [employer] Meds.

## 2024-06-30 NOTE — Telephone Encounter (Signed)
 denied

## 2024-07-04 NOTE — Telephone Encounter (Signed)
 07/04/2024- Appeal fax along with letter to [facility] fax [phone].

## 2024-07-05 ENCOUNTER — Encounter: Attending: Physical Medicine & Rehabilitation | Admitting: Registered Nurse

## 2024-07-05 ENCOUNTER — Encounter: Payer: Self-pay | Admitting: Registered Nurse

## 2024-07-05 VITALS — BP 130/77 | HR 85 | Ht 75.0 in | Wt 221.0 lb

## 2024-07-05 DIAGNOSIS — M47817 Spondylosis without myelopathy or radiculopathy, lumbosacral region: Secondary | ICD-10-CM | POA: Insufficient documentation

## 2024-07-05 DIAGNOSIS — Z79891 Long term (current) use of opiate analgesic: Secondary | ICD-10-CM | POA: Diagnosis present

## 2024-07-05 DIAGNOSIS — G894 Chronic pain syndrome: Secondary | ICD-10-CM | POA: Insufficient documentation

## 2024-07-05 DIAGNOSIS — M25571 Pain in right ankle and joints of right foot: Secondary | ICD-10-CM | POA: Insufficient documentation

## 2024-07-05 DIAGNOSIS — G5692 Unspecified mononeuropathy of left upper limb: Secondary | ICD-10-CM | POA: Insufficient documentation

## 2024-07-05 DIAGNOSIS — G5793 Unspecified mononeuropathy of bilateral lower limbs: Secondary | ICD-10-CM | POA: Diagnosis present

## 2024-07-05 DIAGNOSIS — G629 Polyneuropathy, unspecified: Secondary | ICD-10-CM | POA: Diagnosis present

## 2024-07-05 DIAGNOSIS — M5416 Radiculopathy, lumbar region: Secondary | ICD-10-CM | POA: Diagnosis not present

## 2024-07-05 DIAGNOSIS — M546 Pain in thoracic spine: Secondary | ICD-10-CM | POA: Insufficient documentation

## 2024-07-05 DIAGNOSIS — M542 Cervicalgia: Secondary | ICD-10-CM | POA: Diagnosis present

## 2024-07-05 DIAGNOSIS — M25562 Pain in left knee: Secondary | ICD-10-CM | POA: Insufficient documentation

## 2024-07-05 DIAGNOSIS — M5412 Radiculopathy, cervical region: Secondary | ICD-10-CM | POA: Diagnosis present

## 2024-07-05 DIAGNOSIS — M25561 Pain in right knee: Secondary | ICD-10-CM | POA: Diagnosis present

## 2024-07-05 DIAGNOSIS — G8929 Other chronic pain: Secondary | ICD-10-CM | POA: Diagnosis present

## 2024-07-05 DIAGNOSIS — M2550 Pain in unspecified joint: Secondary | ICD-10-CM | POA: Diagnosis present

## 2024-07-05 DIAGNOSIS — M7061 Trochanteric bursitis, right hip: Secondary | ICD-10-CM | POA: Insufficient documentation

## 2024-07-05 DIAGNOSIS — Z5181 Encounter for therapeutic drug level monitoring: Secondary | ICD-10-CM | POA: Diagnosis present

## 2024-07-05 MED ORDER — GABAPENTIN 400 MG PO CAPS: 400.0000 mg | ORAL_CAPSULE | Freq: Four times a day (QID) | ORAL | 2 refills | Status: AC

## 2024-07-05 MED ORDER — GABAPENTIN 100 MG PO CAPS: ORAL_CAPSULE | ORAL | 2 refills | Status: AC

## 2024-07-05 MED ORDER — OXYCODONE-ACETAMINOPHEN 5-325 MG PO TABS: 1.0000 | ORAL_TABLET | Freq: Two times a day (BID) | ORAL | 0 refills | Status: DC

## 2024-07-05 MED ORDER — OXYCODONE ER 18 MG PO C12A: 1.0000 | EXTENDED_RELEASE_CAPSULE | Freq: Two times a day (BID) | ORAL | 0 refills | Status: DC

## 2024-07-05 NOTE — Progress Notes (Signed)
 Subjective:    Patient ID: David [name], male    DOB: 1976/01/25, 48 y.o.   MRN: [id number]  HPI: David [name] is a 48 y.o. male who returns for follow up appointment for chronic pain and medication refill. He states his pain is located in  his neck radiating into his left shoulder, upper- lower back radiating into her bilateral lower extremities and right hip pain. He also reports bilateral knee pain, right ankle and generalized joint pain. He rates his pain 5. His current exercise regime is walking and performing stretching exercises.  David [name] Morphine equivalent is 78.75 MME. He is also prescribed Diazepam by Angeline Iba NP .We have discussed the black box warning of using opioids and benzodiazepines. I highlighted the dangers of using these drugs together and discussed the adverse events including respiratory suppression, overdose, cognitive impairment and importance of compliance with current regimen. We will continue to monitor and adjust as indicated.    Last Oral Swab was Performed on 07//07/2024, it was consistent.      Pain Inventory Average Pain 5 Pain Right Now 5 My pain is intermittent, constant, sharp, burning, dull, stabbing, and tingling  In the last 24 hours, has pain interfered with the following? General activity 6 Relation with others 5 Enjoyment of life 5 What TIME of day is your pain at its worst? morning , daytime, evening, and night Sleep (in general) Fair  Pain is worse with: walking, bending, sitting, inactivity, standing, and some activites Pain improves with: rest, heat/ice, therapy/exercise, pacing activities, medication, and TENS Relief from Meds: 5  Family History  Problem Relation Age of Onset   Arthritis Mother    Heart disease Mother    Diabetes Mother    Heart disease Father    Diabetes Father    Social History   Socioeconomic History   Marital status: Significant Other    Spouse name: Not on file   Number of children: Not  on file   Years of education: Not on file   Highest education level: Not on file  Occupational History   Not on file  Tobacco Use   Smoking status: Never   Smokeless tobacco: Never  Vaping Use   Vaping status: Never Used  Substance and Sexual Activity   Alcohol  use: No   Drug use: Never   Sexual activity: Not on file  Other Topics Concern   Not on file  Social History Narrative   Not on file   Social Drivers of Health   Financial Resource Strain: Not on file  Food Insecurity: Not on file  Transportation Needs: Not on file  Physical Activity: Not on file  Stress: Not on file  Social Connections: Not on file   Past Surgical History:  Procedure Laterality Date   ANTERIOR CERVICAL DECOMP/DISCECTOMY FUSION  02-15-2008  @[facility]    C4--5,  C6--7, C7--T1   COLONOSCOPY     INGUINAL HERNIA REPAIR Bilateral 12/13/2020   Procedure: LAPAROSCOPIC BILATERAL INGUINAL HERNIA REPAIR AND POSSIBLE APPENDECTOMY;  Surgeon: Sheldon Standing, MD;  Location: [facility];  Service: General;  Laterality: Bilateral;  ERAS   Past Surgical History:  Procedure Laterality Date   ANTERIOR CERVICAL DECOMP/DISCECTOMY FUSION  02-15-2008  @[facility]    C4--5,  C6--7, C7--T1   COLONOSCOPY     INGUINAL HERNIA REPAIR Bilateral 12/13/2020   Procedure: LAPAROSCOPIC BILATERAL INGUINAL HERNIA REPAIR AND POSSIBLE APPENDECTOMY;  Surgeon: Sheldon Standing, MD;  Location: [facility];  Service: General;  Laterality:  Bilateral;  ERAS   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    Family history of adverse reaction to anesthesia    father--- ponv   GERD (gastroesophageal reflux disease)    occasional, watches diet   Greater trochanteric bursitis of right hip    History of kidney stones    Hypertension     Inguinal hernia, bilateral    Lumbar spondylosis    Nocturia    Peripheral neuropathy    bilateral lower legs and a little of left arm   Postlaminectomy syndrome, cervical region    Primary localized osteoarthrosis, lower leg    Radiculopathy, multiple sites in spine    cervical , thoracic, lumbar   Type 2 diabetes mellitus (HCC)    followed by pcp---  (12-11-2020 checks blood sugar 3 times wkly,  fasting sugar--- 125 )   Wears glasses    There were no vitals taken for this visit.  Opioid Risk Score:   Fall Risk Score:  `1  Depression screen [facility] 2/9     05/10/2024    3:44 PM 04/12/2024    1:43 PM 03/08/2024    3:09 PM 02/09/2024    3:11 PM 01/12/2024    3:56 PM 12/15/2023    3:21 PM 11/10/2023    3:12 PM  Depression screen PHQ 2/9  Decreased Interest 0 0 0 0 0 0 0  Down, Depressed, Hopeless 0 0 0 0 0 0 0  PHQ - 2 Score 0 0 0 0 0 0 0  Altered sleeping  0       Tired, decreased energy  0       Change in appetite  0       Feeling bad or failure about yourself   0       Trouble concentrating  0       Moving slowly or fidgety/restless  0       Suicidal thoughts  0       PHQ-9 Score  0         Review of Systems  Musculoskeletal:  Positive for back pain and neck pain.       Left arm pain down to left hand, pain both hips down to both feet  All other systems reviewed and are negative.      Objective:   Physical Exam Vitals and nursing note reviewed.  Constitutional:      Appearance: Normal appearance.  Neck:     Comments: Cervical Paraspinal Tenderness: C-5-C-6  Cardiovascular:     Rate and Rhythm: Normal rate and regular rhythm.     Pulses: Normal pulses.     Heart sounds: Normal heart sounds.  Pulmonary:     Effort: Pulmonary effort is normal.     Breath sounds: Normal breath sounds.  Musculoskeletal:     Comments: Normal Muscle Bulk and Muscle Testing Reveals:  Upper Extremities:Full  ROM and Muscle Strength  5/5 Bilateral AC Joint Tenderness Thoracic and  Lumbar  Hypersensitivity Bilateral Greater Trochanter tenderness Lower Extremities: Full ROM and Muscle Strength 5/5 Arises from Table slowly Narrow Based  Gait     Skin:    General: Skin is warm and dry.  Neurological:     Mental Status: He is alert and oriented  to person, place, and time.  Psychiatric:        Mood and Affect: Mood normal.        Behavior: Behavior normal.          Assessment & Plan:  1. Cervical postlaminectomy syndrome, Hx of ACDF C4-5, C6-7, C7-T1, 02/2008. Continue current medication and alternate using heat and ice therapy. 07/05/2024 2. Spondylosis of L-spine . Continue current treatment regimen and Continue HEP as Tolerated.07/05/2024 3. Chronic Pain Syndrome: Refilled:  Xtampza  18 mg one capsule every 12 hours for pain #60 and Oxycodone  5/325mg  one tablet twice a day, may take a extra tablet when pain is sever # 75.  Continue Voltaren  Gel/ Flector  Patch.. We will continue the opioid monitoring program, this consists of regular clinic visits, examinations, urine drug screen, pill counts as well as use of [place]  Controlled Substance Reporting system. A 12 month History has been reviewed on the [place]  Controlled Substance Reporting System on 07/05/2024. 4. Diabetes with neuropathy. Continue current medication regimen with  Gabapentin . Keep blood sugars under tight control. Continue to monitor. 07/05/2024 5. Cervicalgia/Cervical Radiculopathy/ Thoracic Radiculopathy/ Lumbar Radiculopathy:Continue Gabapentin .07/05/2024 6. Right Greater Trochanter Bursitis: Continue Ice/Heat and Current medication regimen. 07/05/2024 7. Chronic Pain Syndrome/ Myofascial Pain: Continue Flector  Patch/ Alternates with Voltaren  Gel. 07/05/2024 8. Sacroiliac Pain: No complaints today. Continue HEP as Tolerated. Continue to Monitor.07/05/2024 9. Chronic Right Ankle Pain: .Continue HEP as tolerated. Continue to Monitor. 07/05/2024. 10. Post-Op Pain:  No complaints Today.  S/P  LAPAROSCOPIC BILATERAL INGUINAL HERNIA REPAIR AND POSSIBLE APPENDECTOMY Dr Sheldon Following.   11. Polyarthralgia: Continue HEP as Tolerated. Continue to monitor. 07/05/2024  12. Chronic Bilateral  Knee Pain: Continue HEP as Tolerated . Continue current medication regimen. Continue to Monitor.07/05/2024     F/U in 1 month

## 2024-07-07 NOTE — Telephone Encounter (Signed)
 Faxed received Rx approved 07/04/2024-07/04/2025.

## 2024-08-09 ENCOUNTER — Encounter: Attending: Physical Medicine & Rehabilitation | Admitting: Registered Nurse

## 2024-08-09 ENCOUNTER — Encounter: Payer: Self-pay | Admitting: Registered Nurse

## 2024-08-09 VITALS — BP 113/71 | HR 85 | Ht 75.0 in | Wt 222.0 lb

## 2024-08-09 DIAGNOSIS — Z5181 Encounter for therapeutic drug level monitoring: Secondary | ICD-10-CM | POA: Diagnosis present

## 2024-08-09 DIAGNOSIS — M2550 Pain in unspecified joint: Secondary | ICD-10-CM | POA: Insufficient documentation

## 2024-08-09 DIAGNOSIS — M5416 Radiculopathy, lumbar region: Secondary | ICD-10-CM | POA: Diagnosis present

## 2024-08-09 DIAGNOSIS — M25562 Pain in left knee: Secondary | ICD-10-CM | POA: Insufficient documentation

## 2024-08-09 DIAGNOSIS — G894 Chronic pain syndrome: Secondary | ICD-10-CM | POA: Diagnosis present

## 2024-08-09 DIAGNOSIS — M7061 Trochanteric bursitis, right hip: Secondary | ICD-10-CM | POA: Insufficient documentation

## 2024-08-09 DIAGNOSIS — M546 Pain in thoracic spine: Secondary | ICD-10-CM | POA: Diagnosis present

## 2024-08-09 DIAGNOSIS — M5412 Radiculopathy, cervical region: Secondary | ICD-10-CM | POA: Diagnosis not present

## 2024-08-09 DIAGNOSIS — M25571 Pain in right ankle and joints of right foot: Secondary | ICD-10-CM | POA: Insufficient documentation

## 2024-08-09 DIAGNOSIS — M25561 Pain in right knee: Secondary | ICD-10-CM | POA: Diagnosis present

## 2024-08-09 DIAGNOSIS — M47817 Spondylosis without myelopathy or radiculopathy, lumbosacral region: Secondary | ICD-10-CM | POA: Diagnosis present

## 2024-08-09 DIAGNOSIS — M542 Cervicalgia: Secondary | ICD-10-CM | POA: Insufficient documentation

## 2024-08-09 DIAGNOSIS — G8929 Other chronic pain: Secondary | ICD-10-CM | POA: Insufficient documentation

## 2024-08-09 DIAGNOSIS — Z79891 Long term (current) use of opiate analgesic: Secondary | ICD-10-CM | POA: Insufficient documentation

## 2024-08-09 MED ORDER — OXYCODONE ER 18 MG PO C12A: 1.0000 | EXTENDED_RELEASE_CAPSULE | Freq: Two times a day (BID) | ORAL | 0 refills | Status: DC

## 2024-08-09 MED ORDER — OXYCODONE-ACETAMINOPHEN 5-325 MG PO TABS: 1.0000 | ORAL_TABLET | Freq: Two times a day (BID) | ORAL | 0 refills | Status: DC

## 2024-08-09 NOTE — Progress Notes (Signed)
 Subjective:    Patient ID: David [name], male    DOB: [birth year], 48 y.o.   MRN: [id number]  HPI: David [name] is a 48 y.o. male who returns for follow up appointment for chronic pain and medication refill. He states his pain is located in his neck radiating into his left shoulder, left arm with tingling, mid- lower back pain radiating into his bilateral lower extremities and bilateral feet, he also reports right hip pain,bilateral knee pain, right ankle and generalized joint pain,. He rates his pain 6. His current exercise regime is walking and performing stretching exercises.  Mr. Hocevar Morphine equivalent is 82.50 MME.  He is also prescribed Diazepam by Angeline Iba NP .We have discussed the black box warning of using opioids and benzodiazepines. I highlighted the dangers of using these drugs together and discussed the adverse events including respiratory suppression, overdose, cognitive impairment and importance of compliance with current regimen. We will continue to monitor and adjust as indicated.    Last Oral swab was performed on 05/10/2024, it was consistent.     Pain Inventory Average Pain 5 Pain Right Now 6 My pain is intermittent, constant, sharp, burning, dull, stabbing, tingling, and aching  In the last 24 hours, has pain interfered with the following? General activity 6 Relation with others 5 Enjoyment of life 5 What TIME of day is your pain at its worst? morning , daytime, evening, and night Sleep (in general) Fair  Pain is worse with: walking, bending, sitting, inactivity, standing, and some activites Pain improves with: heat/ice, therapy/exercise, pacing activities, medication, and TENS Relief from Meds: 5  Family History  Problem Relation Age of Onset   Arthritis Mother    Heart disease Mother    Diabetes Mother    Heart disease Father    Diabetes Father    Social History   Socioeconomic History   Marital status: Significant Other    Spouse  name: Not on file   Number of children: Not on file   Years of education: Not on file   Highest education level: Not on file  Occupational History   Not on file  Tobacco Use   Smoking status: Never   Smokeless tobacco: Never  Vaping Use   Vaping status: Never Used  Substance and Sexual Activity   Alcohol  use: No   Drug use: Never   Sexual activity: Not on file  Other Topics Concern   Not on file  Social History Narrative   Not on file   Social Drivers of Health   Financial Resource Strain: Not on file  Food Insecurity: Not on file  Transportation Needs: Not on file  Physical Activity: Not on file  Stress: Not on file  Social Connections: Not on file   Past Surgical History:  Procedure Laterality Date   ANTERIOR CERVICAL DECOMP/DISCECTOMY FUSION  02-15-2008  @[facility]    C4--5,  C6--7, C7--T1   COLONOSCOPY     INGUINAL HERNIA REPAIR Bilateral 12/13/2020   Procedure: LAPAROSCOPIC BILATERAL INGUINAL HERNIA REPAIR AND POSSIBLE APPENDECTOMY;  Surgeon: Sheldon Standing, MD;  Location: [facility];  Service: General;  Laterality: Bilateral;  ERAS   Past Surgical History:  Procedure Laterality Date   ANTERIOR CERVICAL DECOMP/DISCECTOMY FUSION  02-15-2008  @[facility]    C4--5,  C6--7, C7--T1   COLONOSCOPY     INGUINAL HERNIA REPAIR Bilateral 12/13/2020   Procedure: LAPAROSCOPIC BILATERAL INGUINAL HERNIA REPAIR AND POSSIBLE APPENDECTOMY;  Surgeon: Sheldon Standing, MD;  Location: [facility];  Service: General;  Laterality: Bilateral;  ERAS   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    Family history of adverse reaction to anesthesia    father--- ponv   GERD (gastroesophageal reflux disease)    occasional, watches diet   Greater trochanteric bursitis of right hip     History of kidney stones    Hypertension    Inguinal hernia, bilateral    Lumbar spondylosis    Nocturia    Peripheral neuropathy    bilateral lower legs and a little of left arm   Postlaminectomy syndrome, cervical region    Primary localized osteoarthrosis, lower leg    Radiculopathy, multiple sites in spine    cervical , thoracic, lumbar   Type 2 diabetes mellitus (HCC)    followed by pcp---  (12-11-2020 checks blood sugar 3 times wkly,  fasting sugar--- 125 )   Wears glasses    BP 113/71   Pulse 85   Ht 6' 3 (1.905 m)   Wt 222 lb (100.7 kg)   SpO2 98%   BMI 27.75 kg/m   Opioid Risk Score:   Fall Risk Score:  `1  Depression screen [facility] 2/9     07/05/2024    3:33 PM 05/10/2024    3:44 PM 04/12/2024    1:43 PM 03/08/2024    3:09 PM 02/09/2024    3:11 PM 01/12/2024    3:56 PM 12/15/2023    3:21 PM  Depression screen PHQ 2/9  Decreased Interest 0 0 0 0 0 0 0  Down, Depressed, Hopeless 0 0 0 0 0 0 0  PHQ - 2 Score 0 0 0 0 0 0 0  Altered sleeping   0      Tired, decreased energy   0      Change in appetite   0      Feeling bad or failure about yourself    0      Trouble concentrating   0      Moving slowly or fidgety/restless   0      Suicidal thoughts   0      PHQ-9 Score   0         Review of Systems  Musculoskeletal:  Positive for back pain.       B/L leg pain  All other systems reviewed and are negative.      Objective:   Physical Exam Vitals and nursing note reviewed.  Constitutional:      Appearance: Normal appearance.  Neck:     Comments: Cervical Paraspinal Tenderness: C-4-C-6 Cardiovascular:     Rate and Rhythm: Normal rate and regular rhythm.     Pulses: Normal pulses.     Heart sounds: Normal heart sounds.  Pulmonary:     Effort: Pulmonary effort is normal.     Breath sounds: Normal breath sounds.  Musculoskeletal:     Comments: Normal Muscle Bulk and Muscle Testing Reveals:  Upper Extremities: Full ROM and Muscle Strength 5/5  Thoracic and  Lumbar Hypersensitivity Right Greater Trochanter Tenderness Lower Extremities: Full ROM and Muscle Strength 5/5 Arises from Chair slowly Narrow Based  Gait     Skin:    General: Skin is warm and dry.  Neurological:  Mental Status: He is alert and oriented to person, place, and time.  Psychiatric:        Mood and Affect: Mood normal.        Behavior: Behavior normal.          Assessment & Plan:  1. Cervical postlaminectomy syndrome, Hx of ACDF C4-5, C6-7, C7-T1, 02/2008. Continue current medication and alternate using heat and ice therapy. 08/09/2024 2. Spondylosis of L-spine . Continue current treatment regimen and Continue HEP as Tolerated.08/09/2024 3. Chronic Pain Syndrome: Refilled:  Xtampza  18 mg one capsule every 12 hours for pain #60 and Oxycodone  5/325mg  one tablet twice a day, may take a extra tablet when pain is sever # 75.  Continue Voltaren  Gel/ Flector  Patch.. We will continue the opioid monitoring program, this consists of regular clinic visits, examinations, urine drug screen, pill counts as well as use of [place]  Controlled Substance Reporting system. A 12 month History has been reviewed on the [place]  Controlled Substance Reporting System on 08/09/2024. 4. Diabetes with neuropathy. Continue current medication regimen with  Gabapentin . Keep blood sugars under tight control. Continue to monitor. 08/09/2024 5. Cervicalgia/Cervical Radiculopathy/ Thoracic Radiculopathy/ Lumbar Radiculopathy:Continue Gabapentin .08/09/2024 6. Right Greater Trochanter Bursitis: Continue Ice/Heat and Current medication regimen. 08/09/2024 7. Chronic Pain Syndrome/ Myofascial Pain: Continue Flector  Patch/ Alternates with Voltaren  Gel. 08/09/2024 8. Sacroiliac Pain: No complaints today. Continue HEP as Tolerated. Continue to Monitor.08/09/2024 9. Chronic Right Ankle Pain: .Continue HEP as tolerated. Continue to Monitor. 08/09/2024. 10. Post-Op Pain:  No complaints Today.  S/P  LAPAROSCOPIC BILATERAL INGUINAL HERNIA REPAIR AND POSSIBLE APPENDECTOMY Dr Sheldon Following.   11. Polyarthralgia: Continue HEP as Tolerated. Continue to monitor. 08/09/2024  12. Chronic Bilateral  Knee Pain: Continue HEP as Tolerated . Continue current medication regimen. Continue to Monitor.08/09/2024     F/U in 1 month

## 2024-09-05 ENCOUNTER — Encounter: Admitting: Registered Nurse

## 2024-09-07 ENCOUNTER — Encounter: Payer: Self-pay | Admitting: Registered Nurse

## 2024-09-07 ENCOUNTER — Encounter: Attending: Physical Medicine & Rehabilitation | Admitting: Registered Nurse

## 2024-09-07 VITALS — BP 122/77 | HR 91 | Ht 75.0 in | Wt 223.8 lb

## 2024-09-07 DIAGNOSIS — G894 Chronic pain syndrome: Secondary | ICD-10-CM | POA: Diagnosis present

## 2024-09-07 DIAGNOSIS — Z5181 Encounter for therapeutic drug level monitoring: Secondary | ICD-10-CM | POA: Diagnosis present

## 2024-09-07 DIAGNOSIS — M546 Pain in thoracic spine: Secondary | ICD-10-CM | POA: Diagnosis not present

## 2024-09-07 DIAGNOSIS — M5416 Radiculopathy, lumbar region: Secondary | ICD-10-CM | POA: Diagnosis present

## 2024-09-07 DIAGNOSIS — Z79891 Long term (current) use of opiate analgesic: Secondary | ICD-10-CM | POA: Diagnosis present

## 2024-09-07 DIAGNOSIS — M25562 Pain in left knee: Secondary | ICD-10-CM | POA: Diagnosis present

## 2024-09-07 DIAGNOSIS — M25561 Pain in right knee: Secondary | ICD-10-CM | POA: Insufficient documentation

## 2024-09-07 DIAGNOSIS — G8929 Other chronic pain: Secondary | ICD-10-CM | POA: Diagnosis present

## 2024-09-07 DIAGNOSIS — M542 Cervicalgia: Secondary | ICD-10-CM | POA: Diagnosis not present

## 2024-09-07 DIAGNOSIS — G629 Polyneuropathy, unspecified: Secondary | ICD-10-CM | POA: Insufficient documentation

## 2024-09-07 DIAGNOSIS — M25571 Pain in right ankle and joints of right foot: Secondary | ICD-10-CM | POA: Diagnosis present

## 2024-09-07 DIAGNOSIS — M7061 Trochanteric bursitis, right hip: Secondary | ICD-10-CM | POA: Insufficient documentation

## 2024-09-07 DIAGNOSIS — M47817 Spondylosis without myelopathy or radiculopathy, lumbosacral region: Secondary | ICD-10-CM | POA: Insufficient documentation

## 2024-09-07 DIAGNOSIS — M5412 Radiculopathy, cervical region: Secondary | ICD-10-CM | POA: Insufficient documentation

## 2024-09-07 DIAGNOSIS — M2550 Pain in unspecified joint: Secondary | ICD-10-CM | POA: Diagnosis present

## 2024-09-07 MED ORDER — OXYCODONE-ACETAMINOPHEN 5-325 MG PO TABS: 1.0000 | ORAL_TABLET | Freq: Two times a day (BID) | ORAL | 0 refills | Status: DC

## 2024-09-07 MED ORDER — OXYCODONE ER 18 MG PO C12A: 1.0000 | EXTENDED_RELEASE_CAPSULE | Freq: Two times a day (BID) | ORAL | 0 refills | Status: DC

## 2024-09-07 NOTE — Progress Notes (Signed)
 Subjective:    Patient ID: David [name], male    DOB: 1976-10-08, 48 y.o.   MRN: [id number]  HPI: David [name] is a 48 y.o. male who returns for follow up appointment for chronic pain and medication refill. He states his pain is located in his neck radiating into his bilateral shoulders, upper- lower back radiating into his right hip and bilateral lower extremities. Also reports bilateral lower extremities, bilateral feet with tingling and burning and right ankle pain. He rates his pain 7. His current exercise regime is walking and performing stretching exercises.  Mr. Reierson Morphine equivalent is 78.75 MME.   Oral Swab was Performed today.    Pain Inventory Average Pain 5 Pain Right Now 7 My pain is intermittent, constant, sharp, burning, dull, stabbing, tingling, and aching  In the last 24 hours, has pain interfered with the following? General activity 6 Relation with others 5 Enjoyment of life 5 What TIME of day is your pain at its worst? morning , daytime, evening, and night Sleep (in general) Fair  Pain is worse with: walking, bending, sitting, inactivity, standing, and some activites Pain improves with: rest, heat/ice, therapy/exercise, pacing activities, medication, and TENS Relief from Meds: 5  Family History  Problem Relation Age of Onset   Arthritis Mother    Heart disease Mother    Diabetes Mother    Heart disease Father    Diabetes Father    Social History   Socioeconomic History   Marital status: Significant Other    Spouse name: Not on file   Number of children: Not on file   Years of education: Not on file   Highest education level: Not on file  Occupational History   Not on file  Tobacco Use   Smoking status: Never   Smokeless tobacco: Never  Vaping Use   Vaping status: Never Used  Substance and Sexual Activity   Alcohol  use: No   Drug use: Never   Sexual activity: Not on file  Other Topics Concern   Not on file  Social History  Narrative   Not on file   Social Drivers of Health   Financial Resource Strain: Not on file  Food Insecurity: Not on file  Transportation Needs: Not on file  Physical Activity: Not on file  Stress: Not on file  Social Connections: Not on file   Past Surgical History:  Procedure Laterality Date   ANTERIOR CERVICAL DECOMP/DISCECTOMY FUSION  02-15-2008  @[facility]    C4--5,  C6--7, C7--T1   COLONOSCOPY     INGUINAL HERNIA REPAIR Bilateral 12/13/2020   Procedure: LAPAROSCOPIC BILATERAL INGUINAL HERNIA REPAIR AND POSSIBLE APPENDECTOMY;  Surgeon: Sheldon Standing, MD;  Location: [facility] ;  Service: General;  Laterality: Bilateral;  ERAS   Past Surgical History:  Procedure Laterality Date   ANTERIOR CERVICAL DECOMP/DISCECTOMY FUSION  02-15-2008  @[facility]    C4--5,  C6--7, C7--T1   COLONOSCOPY     INGUINAL HERNIA REPAIR Bilateral 12/13/2020   Procedure: LAPAROSCOPIC BILATERAL INGUINAL HERNIA REPAIR AND POSSIBLE APPENDECTOMY;  Surgeon: Sheldon Standing, MD;  Location: [facility];  Service: General;  Laterality: Bilateral;  ERAS   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    Family history of adverse reaction to anesthesia  father--- ponv   GERD (gastroesophageal reflux disease)    occasional, watches diet   Greater trochanteric bursitis of right hip    History of kidney stones    Hypertension    Inguinal hernia, bilateral    Lumbar spondylosis    Nocturia    Peripheral neuropathy    bilateral lower legs and a little of left arm   Postlaminectomy syndrome, cervical region    Primary localized osteoarthrosis, lower leg    Radiculopathy, multiple sites in spine    cervical , thoracic, lumbar   Type 2 diabetes mellitus (HCC)    followed by pcp---  (12-11-2020 checks  blood sugar 3 times wkly,  fasting sugar--- 125 )   Wears glasses    BP 122/77 (BP Location: Left Arm, Patient Position: Sitting, Cuff Size: Normal)   Pulse 91   Ht 6' 3 (1.905 m)   Wt 223 lb 12.8 oz (101.5 kg)   SpO2 95%   BMI 27.97 kg/m   Opioid Risk Score:   Fall Risk Score:  `1  Depression screen [facility] 2/9     07/05/2024    3:33 PM 05/10/2024    3:44 PM 04/12/2024    1:43 PM 03/08/2024    3:09 PM 02/09/2024    3:11 PM 01/12/2024    3:56 PM 12/15/2023    3:21 PM  Depression screen PHQ 2/9  Decreased Interest 0 0 0 0 0 0 0  Down, Depressed, Hopeless 0 0 0 0 0 0 0  PHQ - 2 Score 0 0 0 0 0 0 0  Altered sleeping   0      Tired, decreased energy   0      Change in appetite   0      Feeling bad or failure about yourself    0      Trouble concentrating   0      Moving slowly or fidgety/restless   0      Suicidal thoughts   0      PHQ-9 Score   0          Data saved with a previous flowsheet row definition      Review of Systems  Musculoskeletal:  Positive for arthralgias, back pain and myalgias.       Widespread pain throughout body, upper, middle and lower back pain  All other systems reviewed and are negative.      Objective:   Physical Exam Vitals and nursing note reviewed.  Constitutional:      Appearance: Normal appearance.  Neck:     Comments: Cervical Paraspinal Tenderness: C-5-C-6 Cardiovascular:     Rate and Rhythm: Normal rate and regular rhythm.     Pulses: Normal pulses.     Heart sounds: Normal heart sounds.  Pulmonary:     Effort: Pulmonary effort is normal.     Breath sounds: Normal breath sounds.  Musculoskeletal:     Comments: Normal Muscle Bulk and Muscle Testing Reveals:  Upper Extremities: Full ROM and Muscle Strength 5/5 Bi;lateral AC Joint Tenderness  Thoracic and Lumbar Hypersensitivity Right Greater Trochanter Tenderness Lower Extremities: Full ROM and Muscle Strength 5/5 Bilateral Lower extremities Flexion Produces Pain into his Bilateral  Lower Extremities and Bilateral Patella's Arises from Table with ease Narrow Based Gait     Skin:    General: Skin is warm and dry.  Neurological:     Mental Status: He is alert and oriented to person, place, and time.  Psychiatric:  Mood and Affect: Mood normal.        Behavior: Behavior normal.          Assessment & Plan:  1. Cervical postlaminectomy syndrome, Hx of ACDF C4-5, C6-7, C7-T1, 02/2008. Continue current medication and alternate using heat and ice therapy. 09/07/2024 2. Spondylosis of L-spine . Continue current treatment regimen and Continue HEP as Tolerated.09/07/2024 3. Chronic Pain Syndrome: Refilled:  Xtampza  18 mg one capsule every 12 hours for pain #60 and Oxycodone  5/325mg  one tablet twice a day, may take a extra tablet when pain is sever # 75.  Continue Voltaren  Gel/ Flector  Patch.. We will continue the opioid monitoring program, this consists of regular clinic visits, examinations, urine drug screen, pill counts as well as use of [place]  Controlled Substance Reporting system. A 12 month History has been reviewed on the [place]  Controlled Substance Reporting System on 09/07/2024. 4. Diabetes with neuropathy. Continue current medication regimen with  Gabapentin . Keep blood sugars under tight control. Continue to monitor. 09/07/2024 5. Cervicalgia/Cervical Radiculopathy/ Thoracic Radiculopathy/ Lumbar Radiculopathy:Continue Gabapentin .09/07/2024 6. Right Greater Trochanter Bursitis: Continue Ice/Heat and Current medication regimen. 09/07/2024 7. Chronic Pain Syndrome/ Myofascial Pain: Continue Flector  Patch/ Alternates with Voltaren  Gel. 09/07/2024 8. Sacroiliac Pain: No complaints today. Continue HEP as Tolerated. Continue to Monitor.09/07/2024 9. Chronic Right Ankle Pain: .Continue HEP as tolerated. Continue to Monitor. 09/07/2024. 10. Post-Op Pain: Resolved  No complaints Today.  S/P LAPAROSCOPIC BILATERAL INGUINAL HERNIA REPAIR AND POSSIBLE  APPENDECTOMY Dr Sheldon Following.   11. Polyarthralgia: Continue HEP as Tolerated. Continue to monitor. 09/07/2024  12. Chronic Bilateral  Knee Pain: Continue HEP as Tolerated . Continue current medication regimen. Continue to Monitor.09/07/2024     F/U in 1 month

## 2024-09-11 LAB — DRUG TOX MONITOR 1 W/CONF, ORAL FLD
Amphetamines: NEGATIVE ng/mL (ref <10)
Barbiturates: NEGATIVE ng/mL (ref <10)
Benzodiazepines: NEGATIVE ng/mL (ref <0.50)
Buprenorphine: NEGATIVE ng/mL (ref <0.10)
Cocaine: NEGATIVE ng/mL (ref <5.0)
Codeine: NEGATIVE ng/mL (ref <2.5)
Dihydrocodeine: NEGATIVE ng/mL (ref <2.5)
Fentanyl: NEGATIVE ng/mL (ref <0.10)
Heroin Metabolite: NEGATIVE ng/mL (ref <1.0)
Hydrocodone: NEGATIVE ng/mL (ref <2.5)
Hydromorphone: NEGATIVE ng/mL (ref <2.5)
MARIJUANA: NEGATIVE ng/mL (ref <2.5)
MDMA: NEGATIVE ng/mL (ref <10)
Meprobamate: NEGATIVE ng/mL (ref <2.5)
Methadone: NEGATIVE ng/mL (ref <5.0)
Morphine: NEGATIVE ng/mL (ref <2.5)
Nicotine Metabolite: NEGATIVE ng/mL (ref <5.0)
Norhydrocodone: NEGATIVE ng/mL (ref <2.5)
Noroxycodone: 21.3 ng/mL — ABNORMAL HIGH (ref <2.5)
Opiates: POSITIVE ng/mL — AB (ref <2.5)
Oxycodone: >250 ng/mL — ABNORMAL HIGH (ref <2.5)
Oxymorphone: NEGATIVE ng/mL (ref <2.5)
Phencyclidine: NEGATIVE ng/mL (ref <10)
Tapentadol: NEGATIVE ng/mL (ref <5.0)
Tramadol: NEGATIVE ng/mL (ref <5.0)
Zolpidem: NEGATIVE ng/mL (ref <5.0)

## 2024-09-11 LAB — DRUG TOX ALC METAB W/CON, ORAL FLD: Alcohol Metabolite: NEGATIVE ng/mL (ref <25)

## 2024-10-12 ENCOUNTER — Telehealth: Payer: Self-pay | Admitting: Registered Nurse

## 2024-10-12 DIAGNOSIS — G8929 Other chronic pain: Secondary | ICD-10-CM

## 2024-10-12 DIAGNOSIS — M47817 Spondylosis without myelopathy or radiculopathy, lumbosacral region: Secondary | ICD-10-CM

## 2024-10-12 DIAGNOSIS — G894 Chronic pain syndrome: Secondary | ICD-10-CM

## 2024-10-12 DIAGNOSIS — M542 Cervicalgia: Secondary | ICD-10-CM

## 2024-10-12 MED ORDER — OXYCODONE-ACETAMINOPHEN 5-325 MG PO TABS: 1.0000 | ORAL_TABLET | Freq: Two times a day (BID) | ORAL | 0 refills | Status: DC

## 2024-10-12 MED ORDER — OXYCODONE ER 18 MG PO C12A: 1.0000 | EXTENDED_RELEASE_CAPSULE | Freq: Two times a day (BID) | ORAL | 0 refills | Status: DC

## 2024-10-12 NOTE — Telephone Encounter (Signed)
 PDMP was Reviewed.  Xtampza  and Oxycodone  e-scribed to pharmacy. David [name] is aware via My-Chart message.

## 2024-10-18 ENCOUNTER — Encounter: Attending: Physical Medicine & Rehabilitation | Admitting: Registered Nurse

## 2024-10-18 VITALS — BP 107/74 | HR 82 | Ht 75.0 in | Wt 225.0 lb

## 2024-10-18 DIAGNOSIS — M25562 Pain in left knee: Secondary | ICD-10-CM

## 2024-10-18 DIAGNOSIS — M47817 Spondylosis without myelopathy or radiculopathy, lumbosacral region: Secondary | ICD-10-CM | POA: Diagnosis present

## 2024-10-18 DIAGNOSIS — M7061 Trochanteric bursitis, right hip: Secondary | ICD-10-CM | POA: Diagnosis present

## 2024-10-18 DIAGNOSIS — Z5181 Encounter for therapeutic drug level monitoring: Secondary | ICD-10-CM

## 2024-10-18 DIAGNOSIS — Z79891 Long term (current) use of opiate analgesic: Secondary | ICD-10-CM | POA: Diagnosis present

## 2024-10-18 DIAGNOSIS — M2550 Pain in unspecified joint: Secondary | ICD-10-CM

## 2024-10-18 DIAGNOSIS — G894 Chronic pain syndrome: Secondary | ICD-10-CM | POA: Diagnosis present

## 2024-10-18 DIAGNOSIS — M5416 Radiculopathy, lumbar region: Secondary | ICD-10-CM

## 2024-10-18 DIAGNOSIS — G8929 Other chronic pain: Secondary | ICD-10-CM | POA: Diagnosis present

## 2024-10-18 DIAGNOSIS — M5412 Radiculopathy, cervical region: Secondary | ICD-10-CM | POA: Diagnosis present

## 2024-10-18 DIAGNOSIS — G629 Polyneuropathy, unspecified: Secondary | ICD-10-CM | POA: Diagnosis present

## 2024-10-18 DIAGNOSIS — M25561 Pain in right knee: Secondary | ICD-10-CM

## 2024-10-18 DIAGNOSIS — M25571 Pain in right ankle and joints of right foot: Secondary | ICD-10-CM | POA: Diagnosis present

## 2024-10-18 DIAGNOSIS — M542 Cervicalgia: Secondary | ICD-10-CM

## 2024-10-18 DIAGNOSIS — G5793 Unspecified mononeuropathy of bilateral lower limbs: Secondary | ICD-10-CM | POA: Diagnosis present

## 2024-10-18 DIAGNOSIS — M546 Pain in thoracic spine: Secondary | ICD-10-CM | POA: Diagnosis present

## 2024-10-18 MED ORDER — OXYCODONE ER 18 MG PO C12A: 1.0000 | EXTENDED_RELEASE_CAPSULE | Freq: Two times a day (BID) | ORAL | 0 refills | Status: DC

## 2024-10-18 MED ORDER — OXYCODONE-ACETAMINOPHEN 5-325 MG PO TABS: 1.0000 | ORAL_TABLET | Freq: Two times a day (BID) | ORAL | 0 refills | Status: DC

## 2024-10-18 NOTE — Progress Notes (Signed)
 "  Subjective:    Patient ID: David [name], male    DOB: 06-May-1976, 48 y.o.   MRN: [id number]  HPI: David [name] is a 48 y.o. male who returns for follow up appointment for chronic pain and medication refill. She states her  pain is located in his neck radiating into his bilateral shoulders, upper- lower back radiating into his bilateral lower extremities and right hip pain.Also reports right ankle pain bilateral hands, bilateral lower extremities and bilateral feet with tingling and burning. He rates his pain 5. His current exercise regime is walking and performing stretching exercises.  [name] Morphine equivalent is 78.75 MME. He  is also prescribed Diazepam  by Angeline Iba NP .We have discussed the black box warning of using opioids and benzodiazepines. I highlighted the dangers of using these drugs together and discussed the adverse events including respiratory suppression, overdose, cognitive impairment and importance of compliance with current regimen. We will continue to monitor and adjust as indicated.   Last Oral Swab was Performed on 09/07/2024, it was consistent.    Pain Inventory Average Pain 5 Pain Right Now 5 My pain is intermittent, constant, sharp, burning, dull, stabbing, tingling, and aching  In the last 24 hours, has pain interfered with the following? General activity 6 Relation with others 5 Enjoyment of life 5 What TIME of day is your pain at its worst? morning , daytime, evening, and night Sleep (in general) Fair  Pain is worse with: walking, bending, sitting, inactivity, standing, and some activites Pain improves with: rest, heat/ice, therapy/exercise, pacing activities, medication, and TENS Relief from Meds: 5  Family History  Problem Relation Age of Onset   Arthritis Mother    Heart disease Mother    Diabetes Mother    Heart disease Father    Diabetes Father    Social History   Socioeconomic History   Marital status: Significant Other     Spouse name: Not on file   Number of children: Not on file   Years of education: Not on file   Highest education level: Not on file  Occupational History   Not on file  Tobacco Use   Smoking status: Never   Smokeless tobacco: Never  Vaping Use   Vaping status: Never Used  Substance and Sexual Activity   Alcohol  use: No   Drug use: Never   Sexual activity: Not on file  Other Topics Concern   Not on file  Social History Narrative   Not on file   Social Drivers of Health   Tobacco Use: Low Risk (09/07/2024)   Patient History    Smoking Tobacco Use: Never    Smokeless Tobacco Use: Never    Passive Exposure: Not on file  Financial Resource Strain: Not on file  Food Insecurity: Not on file  Transportation Needs: Not on file  Physical Activity: Not on file  Stress: Not on file  Social Connections: Not on file  Depression (PHQ2-9): Low Risk (07/05/2024)   Depression (PHQ2-9)    PHQ-2 Score: 0  Alcohol  Screen: Not on file  Housing: Not on file  Utilities: Not on file  Health Literacy: Not on file   Past Surgical History:  Procedure Laterality Date   ANTERIOR CERVICAL DECOMP/DISCECTOMY FUSION  02-15-2008  @[facility]    C4--5,  C6--7, C7--T1   COLONOSCOPY     INGUINAL HERNIA REPAIR Bilateral 12/13/2020   Procedure: LAPAROSCOPIC BILATERAL INGUINAL HERNIA REPAIR AND POSSIBLE APPENDECTOMY;  Surgeon: Sheldon Standing, MD;  Location: [facility];  Service: General;  Laterality: Bilateral;  ERAS   Past Surgical History:  Procedure Laterality Date   ANTERIOR CERVICAL DECOMP/DISCECTOMY FUSION  02-15-2008  @[facility]    C4--5,  C6--7, C7--T1   COLONOSCOPY     INGUINAL HERNIA REPAIR Bilateral 12/13/2020   Procedure: LAPAROSCOPIC BILATERAL INGUINAL HERNIA REPAIR AND POSSIBLE APPENDECTOMY;  Surgeon: Sheldon Standing, MD;  Location: [facility];  Service: General;  Laterality: Bilateral;  ERAS   Past Medical History:  Diagnosis Date   Cervicalgia    Chronic pain syndrome     pain clinic   Complication of anesthesia    post op urinary retention after ACDF surgery 04/ 2009   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    Family history of adverse reaction to anesthesia    father--- ponv   GERD (gastroesophageal reflux disease)    occasional, watches diet   Greater trochanteric bursitis of right hip    History of kidney stones    Hypertension    Inguinal hernia, bilateral    Lumbar spondylosis    Nocturia    Peripheral neuropathy    bilateral lower legs and a little of left arm   Postlaminectomy syndrome, cervical region    Primary localized osteoarthrosis, lower leg    Radiculopathy, multiple sites in spine    cervical , thoracic, lumbar   Type 2 diabetes mellitus (HCC)    followed by pcp---  (12-11-2020 checks blood sugar 3 times wkly,  fasting sugar--- 125 )   Wears glasses    There were no vitals taken for this visit.  Opioid Risk Score:   Fall Risk Score:  `1  Depression screen [facility] 2/9     07/05/2024    3:33 PM 05/10/2024    3:44 PM 04/12/2024    1:43 PM 03/08/2024    3:09 PM 02/09/2024    3:11 PM 01/12/2024    3:56 PM 12/15/2023    3:21 PM  Depression screen [facility] 2/9  Decreased Interest 0 0 0 0 0 0 0  Down, Depressed, Hopeless 0 0 0 0 0 0 0  PHQ - 2 Score 0 0 0 0 0 0 0  Altered sleeping   0      Tired, decreased energy   0      Change in appetite   0      Feeling bad or failure about yourself    0      Trouble concentrating   0      Moving slowly or fidgety/restless   0      Suicidal thoughts   0      PHQ-9 Score   0          Data saved with a previous flowsheet row definition      Review of Systems  Musculoskeletal:  Positive for back pain, myalgias and neck pain.  All other systems reviewed and are negative.      Objective:   Physical Exam Vitals and nursing note reviewed.  Constitutional:      Appearance: Normal appearance.  Neck:     Comments: Cervical Paraspinal Tenderness: C-5-C-6  Cardiovascular:      Rate and Rhythm: Normal rate and regular rhythm.     Pulses: Normal pulses.     Heart sounds: Normal heart sounds.  Pulmonary:     Effort: Pulmonary effort is normal.     Breath sounds: Normal breath sounds.  Musculoskeletal:     Comments: Normal Muscle  Bulk and Muscle Testing Reveals:  Upper Extremities: Full ROM and Muscle Strength 5/5 Bilateral AC Joint Tenderness  Thoracic and Lumbar Hypersensitivity Right Greater Trochanter Tenderness Lower Extremities: Full ROM and Muscle Strength 5/5 Bilateral Lower Extremities Flexion Produces Pain into his Bilateral Patella's Arises from Chair slowly Narrow Based  Gait     Skin:    General: Skin is warm and dry.  Neurological:     Mental Status: He is alert and oriented to person, place, and time.  Psychiatric:        Mood and Affect: Mood normal.        Behavior: Behavior normal.          Assessment & Plan:  1. Cervical postlaminectomy syndrome, Hx of ACDF C4-5, C6-7, C7-T1, 02/2008. Continue current medication and alternate using heat and ice therapy. 10/18/2024 2. Spondylosis of L-spine . Continue current treatment regimen and Continue HEP as Tolerated.10/18/2024 3. Chronic Pain Syndrome: Refilled:  Xtampza  18 mg one capsule every 12 hours for pain #60 and Oxycodone  5/325mg  one tablet twice a day, may take a extra tablet when pain is sever # 75.  Continue Voltaren  Gel/ Flector  Patch.. We will continue the opioid monitoring program, this consists of regular clinic visits, examinations, urine drug screen, pill counts as well as use of [place]  Controlled Substance Reporting system. A 12 month History has been reviewed on the [place]  Controlled Substance Reporting System on 10/18/2024. 4. Diabetes with neuropathy. Continue current medication regimen with  Gabapentin . Keep blood sugars under tight control. Continue to monitor. 10/18/2024 5. Cervicalgia/Cervical Radiculopathy/ Thoracic Radiculopathy/ Lumbar  Radiculopathy:Continue Gabapentin .10/18/2024 6. Right Greater Trochanter Bursitis: Continue Ice/Heat and Current medication regimen. 10/18/2024 7. Chronic Pain Syndrome/ Myofascial Pain: Continue Flector  Patch/ Alternates with Voltaren  Gel. 10/18/2024 8. Sacroiliac Pain: No complaints today. Continue HEP as Tolerated. Continue to Monitor.10/18/2024 9. Chronic Right Ankle Pain: .Continue HEP as tolerated. Continue to Monitor. 10/18/2024. 10. Post-Op Pain: Resolved  No complaints Today.  S/P LAPAROSCOPIC BILATERAL INGUINAL HERNIA REPAIR AND POSSIBLE APPENDECTOMY Dr Sheldon Following.   11. Polyarthralgia: Continue HEP as Tolerated. Continue to monitor. 10/18/2024  12. Chronic Bilateral  Knee Pain: Continue HEP as Tolerated . Continue current medication regimen. Continue to Monitor.10/18/2024     F/U in 1 month    "

## 2024-10-28 ENCOUNTER — Encounter: Payer: Self-pay | Admitting: Registered Nurse

## 2024-11-29 ENCOUNTER — Encounter: Payer: Self-pay | Admitting: Registered Nurse

## 2024-11-29 ENCOUNTER — Encounter: Attending: Physical Medicine & Rehabilitation | Admitting: Registered Nurse

## 2024-11-29 VITALS — BP 133/75 | HR 89 | Ht 75.0 in | Wt 229.0 lb

## 2024-11-29 DIAGNOSIS — G8929 Other chronic pain: Secondary | ICD-10-CM | POA: Diagnosis present

## 2024-11-29 DIAGNOSIS — M546 Pain in thoracic spine: Secondary | ICD-10-CM | POA: Diagnosis not present

## 2024-11-29 DIAGNOSIS — M25562 Pain in left knee: Secondary | ICD-10-CM | POA: Diagnosis not present

## 2024-11-29 DIAGNOSIS — M47817 Spondylosis without myelopathy or radiculopathy, lumbosacral region: Secondary | ICD-10-CM | POA: Diagnosis not present

## 2024-11-29 DIAGNOSIS — M5412 Radiculopathy, cervical region: Secondary | ICD-10-CM | POA: Diagnosis not present

## 2024-11-29 DIAGNOSIS — M25561 Pain in right knee: Secondary | ICD-10-CM | POA: Insufficient documentation

## 2024-11-29 DIAGNOSIS — M542 Cervicalgia: Secondary | ICD-10-CM | POA: Diagnosis not present

## 2024-11-29 DIAGNOSIS — Z79891 Long term (current) use of opiate analgesic: Secondary | ICD-10-CM | POA: Insufficient documentation

## 2024-11-29 DIAGNOSIS — G629 Polyneuropathy, unspecified: Secondary | ICD-10-CM | POA: Insufficient documentation

## 2024-11-29 DIAGNOSIS — M5416 Radiculopathy, lumbar region: Secondary | ICD-10-CM | POA: Insufficient documentation

## 2024-11-29 DIAGNOSIS — Z5181 Encounter for therapeutic drug level monitoring: Secondary | ICD-10-CM | POA: Diagnosis not present

## 2024-11-29 DIAGNOSIS — M7061 Trochanteric bursitis, right hip: Secondary | ICD-10-CM | POA: Insufficient documentation

## 2024-11-29 DIAGNOSIS — G894 Chronic pain syndrome: Secondary | ICD-10-CM | POA: Diagnosis not present

## 2024-11-29 DIAGNOSIS — M25571 Pain in right ankle and joints of right foot: Secondary | ICD-10-CM | POA: Insufficient documentation

## 2024-11-29 DIAGNOSIS — M2550 Pain in unspecified joint: Secondary | ICD-10-CM | POA: Diagnosis not present

## 2024-11-29 MED ORDER — OXYCODONE ER 18 MG PO C12A: 1.0000 | EXTENDED_RELEASE_CAPSULE | Freq: Two times a day (BID) | ORAL | 0 refills | Status: AC

## 2024-11-29 MED ORDER — OXYCODONE-ACETAMINOPHEN 5-325 MG PO TABS: 1.0000 | ORAL_TABLET | Freq: Two times a day (BID) | ORAL | 0 refills | Status: AC

## 2024-11-29 NOTE — Progress Notes (Signed)
 "  Subjective:    Patient ID: David [name], male    DOB: 03-26-1976, 49 y.o.   MRN: [id number]  HPI: David [name] is a 49 y.o. male who returns for follow up appointment for chronic pain and medication refill. He states his pain is located in his neck radiating into his bilateral shoulders, upper to lower back radiating into his bilateral lower extremities and bilateral feet with tingling and burning. Also reports right hip pain and right ankle pain. He rates his pain 6. His current exercise regime is walking and performing stretching exercises.  Mr. Barris Morphine equivalent is 78.75 MME. He is also prescribed Diazepam  by Angeline Iba NP .We have discussed the black box warning of using opioids and benzodiazepines. I highlighted the dangers of using these drugs together and discussed the adverse events including respiratory suppression, overdose, cognitive impairment and importance of compliance with current regimen. We will continue to monitor and adjust as indicated.    Last Oral Swab was Performed on 09/07/2025, it was consistent.      Pain Inventory Average Pain 5 Pain Right Now 6 My pain is intermittent, constant, sharp, burning, stabbing, tingling, and aching  In the last 24 hours, has pain interfered with the following? General activity 5 Relation with others 4 Enjoyment of life 5 What TIME of day is your pain at its worst? morning , daytime, evening, and night Sleep (in general) Fair  Pain is worse with: walking, bending, sitting, inactivity, standing, and some activites Pain improves with: rest, heat/ice, therapy/exercise, pacing activities, medication, and TENS Relief from Meds: 5  Family History  Problem Relation Age of Onset   Arthritis Mother    Heart disease Mother    Diabetes Mother    Heart disease Father    Diabetes Father    Social History   Socioeconomic History   Marital status: Significant Other    Spouse name: Not on file   Number of  children: Not on file   Years of education: Not on file   Highest education level: Not on file  Occupational History   Not on file  Tobacco Use   Smoking status: Never   Smokeless tobacco: Never  Vaping Use   Vaping status: Never Used  Substance and Sexual Activity   Alcohol  use: No   Drug use: Never   Sexual activity: Not on file  Other Topics Concern   Not on file  Social History Narrative   Not on file   Social Drivers of Health   Tobacco Use: Low Risk (10/28/2024)   Patient History    Smoking Tobacco Use: Never    Smokeless Tobacco Use: Never    Passive Exposure: Not on file  Financial Resource Strain: Not on file  Food Insecurity: Not on file  Transportation Needs: Not on file  Physical Activity: Not on file  Stress: Not on file  Social Connections: Not on file  Depression (PHQ2-9): Low Risk (07/05/2024)   Depression (PHQ2-9)    PHQ-2 Score: 0  Alcohol  Screen: Not on file  Housing: Not on file  Utilities: Not on file  Health Literacy: Not on file   Past Surgical History:  Procedure Laterality Date   ANTERIOR CERVICAL DECOMP/DISCECTOMY FUSION  02-15-2008  @[facility]    C4--5,  C6--7, C7--T1   COLONOSCOPY     INGUINAL HERNIA REPAIR Bilateral 12/13/2020   Procedure: LAPAROSCOPIC BILATERAL INGUINAL HERNIA REPAIR AND POSSIBLE APPENDECTOMY;  Surgeon: Sheldon Standing, MD;  Location: [facility];  Service: General;  Laterality: Bilateral;  ERAS   Past Surgical History:  Procedure Laterality Date   ANTERIOR CERVICAL DECOMP/DISCECTOMY FUSION  02-15-2008  @[facility]    C4--5,  C6--7, C7--T1   COLONOSCOPY     INGUINAL HERNIA REPAIR Bilateral 12/13/2020   Procedure: LAPAROSCOPIC BILATERAL INGUINAL HERNIA REPAIR AND POSSIBLE APPENDECTOMY;  Surgeon: Sheldon Standing, MD;  Location: [facility];  Service: General;  Laterality: Bilateral;  ERAS   Past Medical History:  Diagnosis Date   Cervicalgia    Chronic pain syndrome    pain clinic   Complication of  anesthesia    post op urinary retention after ACDF surgery 04/ 2009   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    Family history of adverse reaction to anesthesia    father--- ponv   GERD (gastroesophageal reflux disease)    occasional, watches diet   Greater trochanteric bursitis of right hip    History of kidney stones    Hypertension    Inguinal hernia, bilateral    Lumbar spondylosis    Nocturia    Peripheral neuropathy    bilateral lower legs and a little of left arm   Postlaminectomy syndrome, cervical region    Primary localized osteoarthrosis, lower leg    Radiculopathy, multiple sites in spine    cervical , thoracic, lumbar   Type 2 diabetes mellitus (HCC)    followed by pcp---  (12-11-2020 checks blood sugar 3 times wkly,  fasting sugar--- 125 )   Wears glasses    BP 133/75   Pulse 89   Ht 6' 3 (1.905 m)   Wt 229 lb (103.9 kg)   SpO2 99%   BMI 28.62 kg/m   Opioid Risk Score:   Fall Risk Score:  `1  Depression screen [facility] 2/9     07/05/2024    3:33 PM 05/10/2024    3:44 PM 04/12/2024    1:43 PM 03/08/2024    3:09 PM 02/09/2024    3:11 PM 01/12/2024    3:56 PM 12/15/2023    3:21 PM  Depression screen PHQ 2/9  Decreased Interest 0 0 0 0 0 0 0  Down, Depressed, Hopeless 0 0 0 0 0 0 0  PHQ - 2 Score 0 0 0 0 0 0 0  Altered sleeping   0      Tired, decreased energy   0      Change in appetite   0      Feeling bad or failure about yourself    0      Trouble concentrating   0      Moving slowly or fidgety/restless   0      Suicidal thoughts   0      PHQ-9 Score   0          Data saved with a previous flowsheet row definition    Review of Systems  Musculoskeletal:  Positive for back pain.       B/L leg pain Left arm pain  All other systems reviewed and are negative.      Objective:   Physical Exam Vitals and nursing note reviewed.  Constitutional:      Appearance: Normal appearance.  Neck:     Comments: Cervical Paraspinal  Tenderness: C-5-C-6  Cardiovascular:     Rate and Rhythm: Normal rate and regular rhythm.     Pulses: Normal pulses.     Heart sounds: Normal heart sounds.  Pulmonary:  Effort: Pulmonary effort is normal.     Breath sounds: Normal breath sounds.  Musculoskeletal:     Comments: Normal Muscle Bulk and Muscle Testing Reveals:  Upper Extremities: Full ROM and Muscle Strength 5/5  Thoracic and Lumbar Hypersensitivity Right Greater Trochanter Tenderness Lower Extremities: Full ROM and Muscle Strength 5/5 Left Lower Extremity Flexion Produces Pain into his Left Patella Arises from Chair slowly Narrow Based  Gait     Skin:    General: Skin is warm and dry.  Neurological:     Mental Status: He is alert and oriented to person, place, and time.  Psychiatric:        Mood and Affect: Mood normal.        Behavior: Behavior normal.          Assessment & Plan:  1. Cervical postlaminectomy syndrome, Hx of ACDF C4-5, C6-7, C7-T1, 02/2008. Continue current medication and alternate using heat and ice therapy. 11/29/2024 2. Spondylosis of L-spine . Continue current treatment regimen and Continue HEP as Tolerated.11/29/2024 3. Chronic Pain Syndrome: Refilled:  Xtampza  18 mg one capsule every 12 hours for pain #60 and Oxycodone  5/325mg  one tablet twice a day, may take a extra tablet when pain is sever # 75.  Continue Voltaren  Gel/ Flector  Patch.. We will continue the opioid monitoring program, this consists of regular clinic visits, examinations, urine drug screen, pill counts as well as use of [place]  Controlled Substance Reporting system. A 12 month History has been reviewed on the [place]  Controlled Substance Reporting System on 11/29/2024. 4. Diabetes with neuropathy. Continue current medication regimen with  Gabapentin . Keep blood sugars under tight control. Continue to monitor. 11/29/2024 5. Cervicalgia/Cervical Radiculopathy/ Thoracic Radiculopathy/ Lumbar Radiculopathy:Continue  Gabapentin .11/29/2024 6. Right Greater Trochanter Bursitis: Continue Ice/Heat and Current medication regimen. 11/29/2024 7. Chronic Pain Syndrome/ Myofascial Pain: Continue Flector  Patch/ Alternates with Voltaren  Gel. [id number] 8. Sacroiliac Pain: No complaints today. Continue HEP as Tolerated. Continue to Monitor.11/29/2024 9. Chronic Right Ankle Pain: .Continue HEP as tolerated. Continue to Monitor. 11/29/2024. 10. Post-Op Pain: Resolved  No complaints Today.  S/P LAPAROSCOPIC BILATERAL INGUINAL HERNIA REPAIR AND POSSIBLE APPENDECTOMY Dr Sheldon Following.   11. Polyarthralgia: Continue HEP as Tolerated. Continue to monitor. 11/29/2024  12. Chronic Bilateral  Knee Pain: L>R Continue HEP as Tolerated . Continue current medication regimen. Continue to Monitor.11/29/2024     F/U in 1 month       "

## 2024-12-27 ENCOUNTER — Encounter: Admitting: Registered Nurse

## 2025-01-24 ENCOUNTER — Encounter: Admitting: Registered Nurse

## 2025-02-21 ENCOUNTER — Encounter: Admitting: Registered Nurse

## 2025-03-21 ENCOUNTER — Encounter: Admitting: Registered Nurse
